# Patient Record
Sex: Male | Born: 1990 | Race: Black or African American | Hispanic: No | Marital: Single | Smoking: Never smoker
Health system: Southern US, Community
[De-identification: ages and names within clinical notes are randomized; demographics above are authoritative.]

## PROBLEM LIST (undated history)

## (undated) DIAGNOSIS — R569 Unspecified convulsions: Secondary | ICD-10-CM

## (undated) DIAGNOSIS — T148XXA Other injury of unspecified body region, initial encounter: Secondary | ICD-10-CM

## (undated) DIAGNOSIS — I10 Essential (primary) hypertension: Secondary | ICD-10-CM

## (undated) DIAGNOSIS — S72143A Displaced intertrochanteric fracture of unspecified femur, initial encounter for closed fracture: Secondary | ICD-10-CM

## (undated) DIAGNOSIS — F79 Unspecified intellectual disabilities: Secondary | ICD-10-CM

## (undated) DIAGNOSIS — Z9689 Presence of other specified functional implants: Secondary | ICD-10-CM

## (undated) DIAGNOSIS — G4731 Primary central sleep apnea: Secondary | ICD-10-CM

## (undated) DIAGNOSIS — G809 Cerebral palsy, unspecified: Secondary | ICD-10-CM

## (undated) DIAGNOSIS — R0681 Apnea, not elsewhere classified: Secondary | ICD-10-CM

## (undated) HISTORY — PX: SHOULDER SURGERY: SHX246

## (undated) HISTORY — PX: IMPLANTATION VAGAL NERVE STIMULATOR: SUR692

## (undated) HISTORY — PX: HIP SURGERY: SHX245

## (undated) HISTORY — DX: Primary central sleep apnea: G47.31

## (undated) HISTORY — DX: Apnea, not elsewhere classified: R06.81

## (undated) HISTORY — DX: Presence of other specified functional implants: Z96.89

## (undated) HISTORY — PX: FOOT SURGERY: SHX648

## (undated) HISTORY — PX: FRACTURE SURGERY: SHX138

## (undated) HISTORY — DX: Other injury of unspecified body region, initial encounter: T14.8XXA

## (undated) HISTORY — DX: Displaced intertrochanteric fracture of unspecified femur, initial encounter for closed fracture: S72.143A

---
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
## 2009-03-13 ENCOUNTER — Emergency Department (HOSPITAL_COMMUNITY): Admission: EM | Admit: 2009-03-13 | Discharge: 2009-03-13 | Payer: Self-pay | Admitting: Emergency Medicine

## 2009-05-11 IMAGING — CR DG CHEST 2V
2 series · 2 of 2 positions shown · non-contrast
Comparison: 06/13/2008

CLINICAL DATA: Seizure

CHEST - 2 VIEW

[w chest lat]
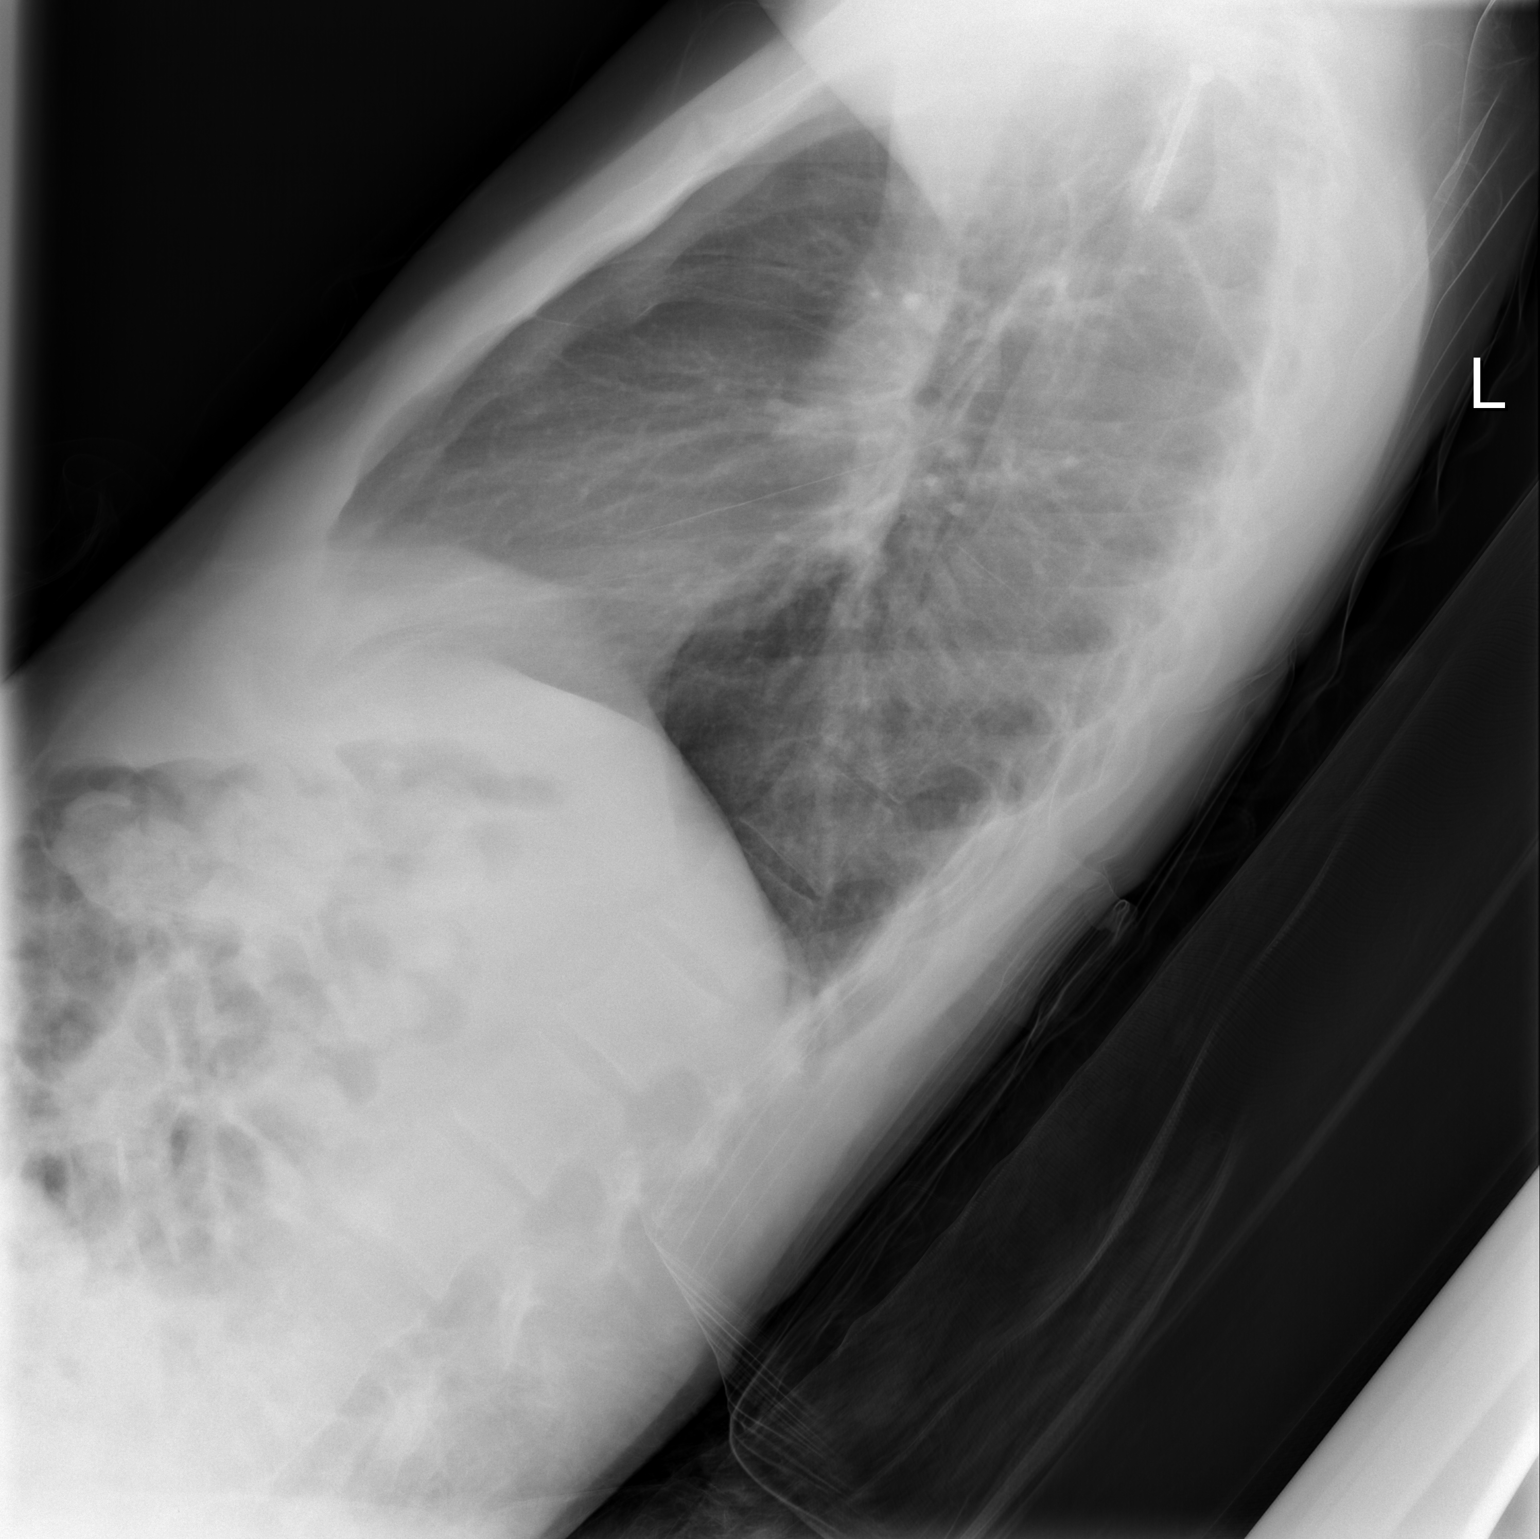

[view not recorded]
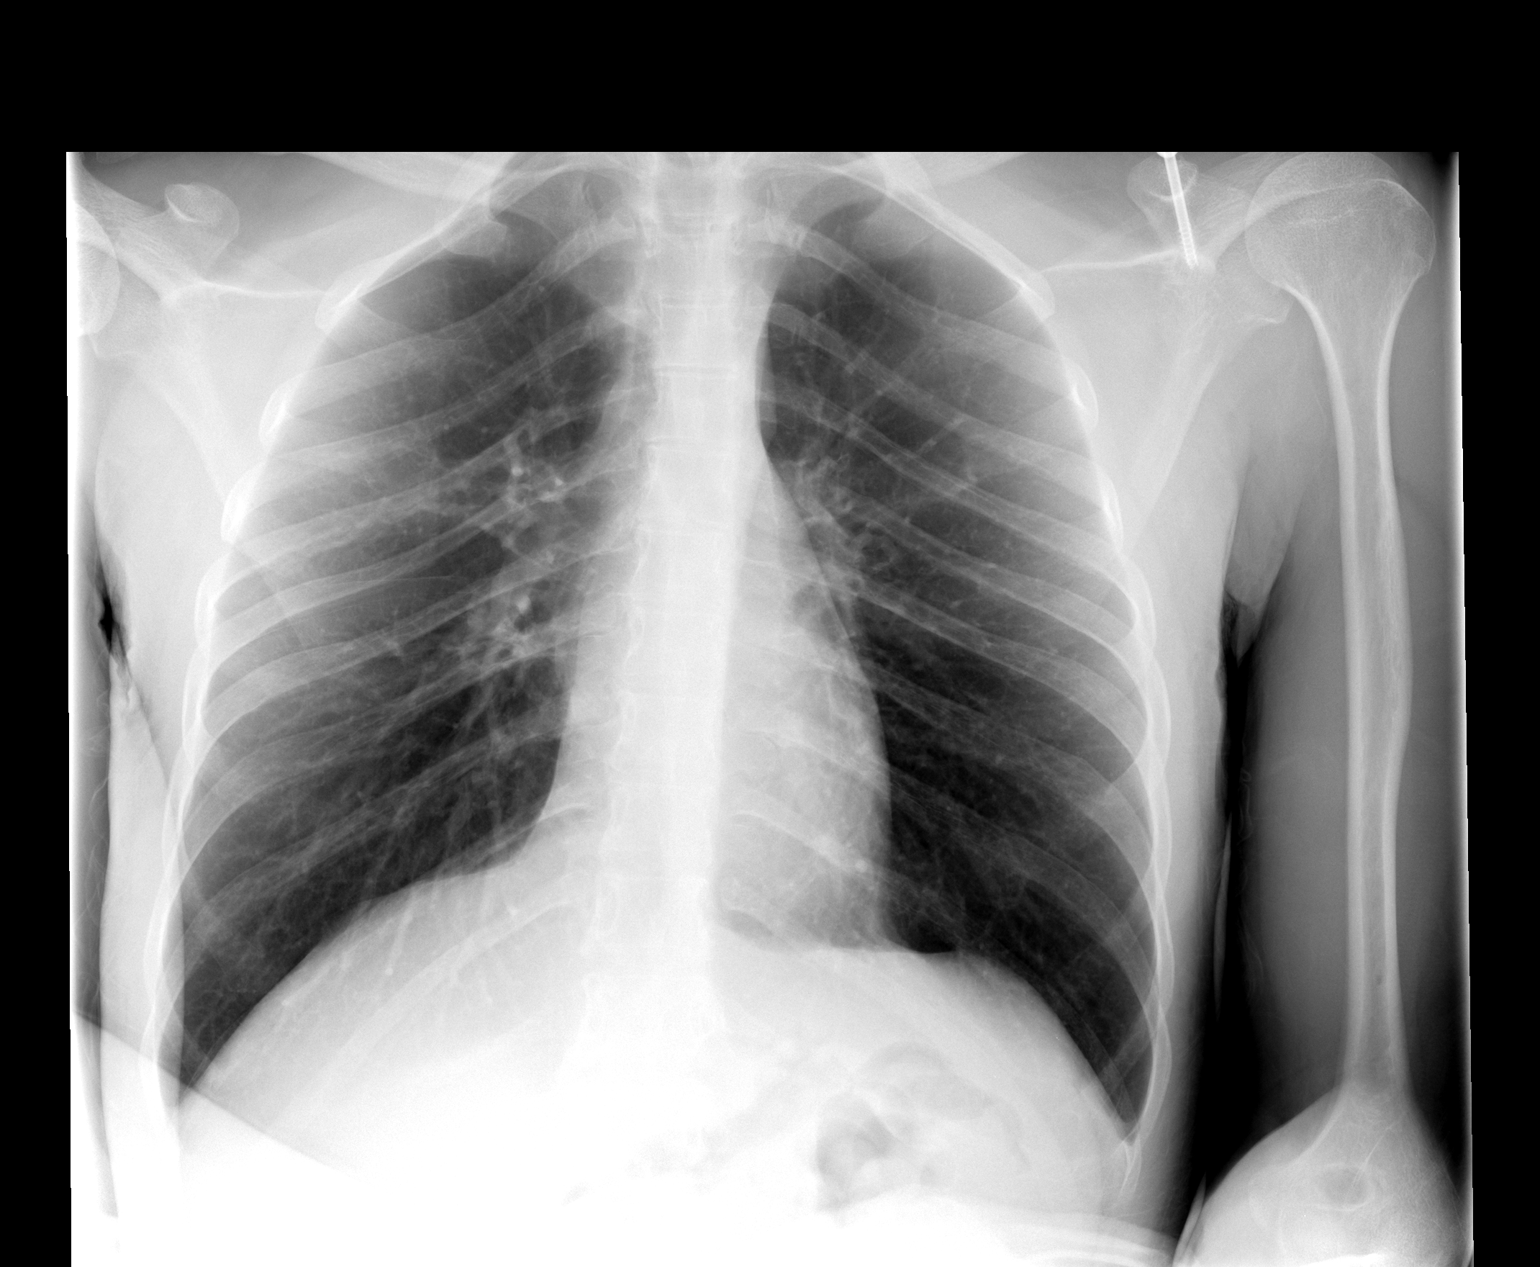

[2 of 2 positions shown; findings below may reference images not displayed]

FINDINGS: Lungs are clear.  Heart is normal in size.  No
pneumothorax.
IMPRESSION: No acute cardiopulmonary disease.

## 2009-08-28 ENCOUNTER — Emergency Department (HOSPITAL_COMMUNITY): Admission: EM | Admit: 2009-08-28 | Discharge: 2009-08-28 | Payer: Self-pay | Admitting: Emergency Medicine

## 2009-12-10 ENCOUNTER — Emergency Department (HOSPITAL_COMMUNITY): Admission: EM | Admit: 2009-12-10 | Discharge: 2009-12-10 | Payer: Self-pay | Admitting: Emergency Medicine

## 2009-12-13 ENCOUNTER — Emergency Department (HOSPITAL_COMMUNITY): Admission: EM | Admit: 2009-12-13 | Discharge: 2009-12-13 | Payer: Self-pay | Admitting: Emergency Medicine

## 2010-02-09 ENCOUNTER — Emergency Department (HOSPITAL_COMMUNITY): Admission: EM | Admit: 2010-02-09 | Discharge: 2010-02-09 | Payer: Self-pay | Admitting: Emergency Medicine

## 2010-02-28 ENCOUNTER — Emergency Department (HOSPITAL_COMMUNITY): Admission: EM | Admit: 2010-02-28 | Discharge: 2010-03-01 | Payer: Self-pay | Admitting: Emergency Medicine

## 2010-06-25 ENCOUNTER — Encounter: Payer: Self-pay | Admitting: Pediatrics

## 2010-08-02 ENCOUNTER — Emergency Department (HOSPITAL_COMMUNITY)
Admission: EM | Admit: 2010-08-02 | Discharge: 2010-08-03 | Disposition: A | Discharge status: Home / Self Care | Payer: Medicaid Other | Attending: Emergency Medicine | Admitting: Emergency Medicine

## 2010-08-02 DIAGNOSIS — G809 Cerebral palsy, unspecified: Secondary | ICD-10-CM | POA: Insufficient documentation

## 2010-08-02 DIAGNOSIS — G40909 Epilepsy, unspecified, not intractable, without status epilepticus: Secondary | ICD-10-CM | POA: Insufficient documentation

## 2010-08-02 DIAGNOSIS — Z79899 Other long term (current) drug therapy: Secondary | ICD-10-CM | POA: Insufficient documentation

## 2010-08-02 DIAGNOSIS — R4701 Aphasia: Secondary | ICD-10-CM | POA: Insufficient documentation

## 2010-08-02 DIAGNOSIS — F79 Unspecified intellectual disabilities: Secondary | ICD-10-CM | POA: Insufficient documentation

## 2010-08-03 LAB — URINALYSIS, ROUTINE W REFLEX MICROSCOPIC
Ketones, ur: NEGATIVE mg/dL
Leukocytes, UA: NEGATIVE
Protein, ur: NEGATIVE mg/dL
Urobilinogen, UA: 1 mg/dL (ref 0.0–1.0)

## 2010-08-03 LAB — URINE MICROSCOPIC-ADD ON

## 2010-08-17 LAB — DIFFERENTIAL
Basophils Absolute: 0 10*3/uL (ref 0.0–0.1)
Basophils Relative: 0 % (ref 0–1)
Eosinophils Relative: 1 % (ref 0–5)
Monocytes Absolute: 0.4 10*3/uL (ref 0.1–1.0)
Monocytes Relative: 6 % (ref 3–12)

## 2010-08-17 LAB — CBC
HCT: 41.1 % (ref 39.0–52.0)
MCH: 28.9 pg (ref 26.0–34.0)
MCHC: 33.5 g/dL (ref 30.0–36.0)
MCV: 86.2 fL (ref 78.0–100.0)
RDW: 13.2 % (ref 11.5–15.5)

## 2010-08-17 LAB — URINALYSIS, ROUTINE W REFLEX MICROSCOPIC
Bilirubin Urine: NEGATIVE
Glucose, UA: NEGATIVE mg/dL
Hgb urine dipstick: NEGATIVE
Ketones, ur: NEGATIVE mg/dL
Ketones, ur: NEGATIVE mg/dL
Nitrite: NEGATIVE
Nitrite: NEGATIVE
Protein, ur: NEGATIVE mg/dL
Urobilinogen, UA: 1 mg/dL (ref 0.0–1.0)

## 2010-08-17 LAB — POCT I-STAT, CHEM 8
BUN: 10 mg/dL (ref 6–23)
Calcium, Ion: 1.11 mmol/L — ABNORMAL LOW (ref 1.12–1.32)
Chloride: 102 mEq/L (ref 96–112)
HCT: 43 % (ref 39.0–52.0)
Sodium: 137 mEq/L (ref 135–145)
TCO2: 28 mmol/L (ref 0–100)

## 2010-08-17 LAB — URINE CULTURE
Colony Count: NO GROWTH
Culture  Setup Time: 201109280133
Culture: NO GROWTH

## 2010-08-20 LAB — POCT I-STAT, CHEM 8
BUN: 14 mg/dL (ref 6–23)
Chloride: 106 mEq/L (ref 96–112)
Creatinine, Ser: 1.1 mg/dL (ref 0.4–1.5)
Glucose, Bld: 87 mg/dL (ref 70–99)
Hemoglobin: 14.6 g/dL (ref 13.0–17.0)
Potassium: 4.1 mEq/L (ref 3.5–5.1)
Sodium: 140 mEq/L (ref 135–145)

## 2010-08-22 ENCOUNTER — Emergency Department (HOSPITAL_COMMUNITY)
Admission: EM | Admit: 2010-08-22 | Discharge: 2010-08-22 | Disposition: A | Discharge status: Home / Self Care | Payer: Medicaid Other | Attending: Emergency Medicine | Admitting: Emergency Medicine

## 2010-08-22 DIAGNOSIS — G809 Cerebral palsy, unspecified: Secondary | ICD-10-CM | POA: Insufficient documentation

## 2010-08-22 DIAGNOSIS — Z79899 Other long term (current) drug therapy: Secondary | ICD-10-CM | POA: Insufficient documentation

## 2010-08-22 DIAGNOSIS — F79 Unspecified intellectual disabilities: Secondary | ICD-10-CM | POA: Insufficient documentation

## 2010-08-22 DIAGNOSIS — G40909 Epilepsy, unspecified, not intractable, without status epilepticus: Secondary | ICD-10-CM | POA: Insufficient documentation

## 2010-08-22 LAB — URINALYSIS, ROUTINE W REFLEX MICROSCOPIC
Bilirubin Urine: NEGATIVE
Glucose, UA: NEGATIVE mg/dL
Ketones, ur: NEGATIVE mg/dL
Nitrite: NEGATIVE
Specific Gravity, Urine: 1.024 (ref 1.005–1.030)
pH: 7 (ref 5.0–8.0)

## 2010-08-22 LAB — BASIC METABOLIC PANEL
BUN: 12 mg/dL (ref 6–23)
CO2: 27 mEq/L (ref 19–32)
Chloride: 107 mEq/L (ref 96–112)
GFR calc non Af Amer: >60 mL/min (ref >60)
Glucose, Bld: 100 mg/dL — ABNORMAL HIGH (ref 70–99)
Potassium: 3.9 mEq/L (ref 3.5–5.1)
Sodium: 139 mEq/L (ref 135–145)

## 2010-09-10 LAB — URINE MICROSCOPIC-ADD ON

## 2010-09-10 LAB — URINE CULTURE

## 2010-09-10 LAB — COMPREHENSIVE METABOLIC PANEL
ALT: 19 U/L (ref 0–53)
Alkaline Phosphatase: 66 U/L (ref 39–117)
BUN: 8 mg/dL (ref 6–23)
CO2: 29 mEq/L (ref 19–32)
Calcium: 8.9 mg/dL (ref 8.4–10.5)
GFR calc non Af Amer: >60 mL/min (ref >60)
Glucose, Bld: 106 mg/dL — ABNORMAL HIGH (ref 70–99)
Potassium: 4.4 mEq/L (ref 3.5–5.1)
Sodium: 140 mEq/L (ref 135–145)

## 2010-09-10 LAB — DIFFERENTIAL
Eosinophils Absolute: 0 10*3/uL (ref 0.0–0.7)
Lymphocytes Relative: 55 % — ABNORMAL HIGH (ref 12–46)
Monocytes Absolute: 0.2 10*3/uL (ref 0.1–1.0)
Neutrophils Relative %: 38 % — ABNORMAL LOW (ref 43–77)

## 2010-09-10 LAB — CBC
HCT: 37.6 % — ABNORMAL LOW (ref 39.0–52.0)
Hemoglobin: 12.8 g/dL — ABNORMAL LOW (ref 13.0–17.0)
MCHC: 34.1 g/dL (ref 30.0–36.0)
RBC: 4.42 MIL/uL (ref 4.22–5.81)
RDW: 13.4 % (ref 11.5–15.5)

## 2010-09-10 LAB — URINALYSIS, ROUTINE W REFLEX MICROSCOPIC
Bilirubin Urine: NEGATIVE
Glucose, UA: NEGATIVE mg/dL
Hgb urine dipstick: NEGATIVE
Specific Gravity, Urine: 1.027 (ref 1.005–1.030)
pH: 7 (ref 5.0–8.0)

## 2010-09-10 LAB — D-DIMER, QUANTITATIVE: D-Dimer, Quant: 0.26 ug/mL-FEU (ref 0.00–0.48)

## 2010-09-10 LAB — CULTURE, BLOOD (ROUTINE X 2)

## 2010-09-11 LAB — POCT I-STAT EG7
Acid-base deficit: 18 mmol/L — ABNORMAL HIGH (ref 0.0–2.0)
Bicarbonate: 14.2 mEq/L — ABNORMAL LOW (ref 20.0–24.0)
HCT: 45 % (ref 39.0–52.0)
O2 Saturation: 99 %
TCO2: 16 mmol/L (ref 0–100)
pO2, Ven: 244 mmHg — ABNORMAL HIGH (ref 30.0–45.0)

## 2010-09-11 LAB — URINALYSIS, ROUTINE W REFLEX MICROSCOPIC
Bilirubin Urine: NEGATIVE
Glucose, UA: NEGATIVE mg/dL
Hgb urine dipstick: NEGATIVE
Nitrite: NEGATIVE
Specific Gravity, Urine: 1.031 — ABNORMAL HIGH (ref 1.005–1.030)
pH: 8.5 — ABNORMAL HIGH (ref 5.0–8.0)

## 2010-09-11 LAB — POCT I-STAT, CHEM 8
BUN: 12 mg/dL (ref 6–23)
Calcium, Ion: 1.09 mmol/L — ABNORMAL LOW (ref 1.12–1.32)
Chloride: 106 mEq/L (ref 96–112)
Sodium: 140 mEq/L (ref 135–145)

## 2010-09-11 LAB — DIFFERENTIAL
Basophils Relative: 0 % (ref 0–1)
Eosinophils Absolute: 0 10*3/uL (ref 0.0–0.7)
Monocytes Absolute: 0.4 10*3/uL (ref 0.1–1.0)
Monocytes Relative: 3 % (ref 3–12)

## 2010-09-11 LAB — COMPREHENSIVE METABOLIC PANEL
BUN: 11 mg/dL (ref 6–23)
CO2: 23 mEq/L (ref 19–32)
Chloride: 103 mEq/L (ref 96–112)
Creatinine, Ser: 0.67 mg/dL (ref 0.4–1.5)
GFR calc non Af Amer: >60 mL/min (ref >60)
Glucose, Bld: 131 mg/dL — ABNORMAL HIGH (ref 70–99)
Total Bilirubin: 0.7 mg/dL (ref 0.3–1.2)

## 2010-09-11 LAB — URINE CULTURE

## 2010-09-11 LAB — CBC
HCT: 45.1 % (ref 39.0–52.0)
MCV: 85.2 fL (ref 78.0–100.0)
RBC: 5.29 MIL/uL (ref 4.22–5.81)
WBC: 11.3 10*3/uL — ABNORMAL HIGH (ref 4.0–10.5)

## 2010-09-11 LAB — PHENYTOIN LEVEL, TOTAL: Phenytoin Lvl: 19.7 ug/mL (ref 10.0–20.0)

## 2010-09-11 LAB — URINE MICROSCOPIC-ADD ON

## 2010-09-11 LAB — CULTURE, BLOOD (SINGLE)

## 2010-09-18 LAB — URINE MICROSCOPIC-ADD ON

## 2010-09-18 LAB — URINE CULTURE
Colony Count: >=100000
Colony Count: >=100000

## 2010-09-18 LAB — BASIC METABOLIC PANEL
CO2: 24 mEq/L (ref 19–32)
CO2: 28 mEq/L (ref 19–32)
Calcium: 8.1 mg/dL — ABNORMAL LOW (ref 8.4–10.5)
Calcium: 8.6 mg/dL (ref 8.4–10.5)
Creatinine, Ser: 0.65 mg/dL (ref 0.4–1.5)
Creatinine, Ser: 0.75 mg/dL (ref 0.4–1.5)
GFR calc Af Amer: >60 mL/min (ref >60)
GFR calc Af Amer: >60 mL/min (ref >60)
GFR calc Af Amer: >60 mL/min (ref >60)
GFR calc non Af Amer: >60 mL/min (ref >60)
GFR calc non Af Amer: >60 mL/min (ref >60)
GFR calc non Af Amer: >60 mL/min (ref >60)
GFR calc non Af Amer: >60 mL/min (ref >60)
Glucose, Bld: 158 mg/dL — ABNORMAL HIGH (ref 70–99)
Glucose, Bld: 68 mg/dL — ABNORMAL LOW (ref 70–99)
Potassium: 4 mEq/L (ref 3.5–5.1)
Potassium: 4.1 mEq/L (ref 3.5–5.1)
Sodium: 135 mEq/L (ref 135–145)
Sodium: 138 mEq/L (ref 135–145)
Sodium: 140 mEq/L (ref 135–145)
Sodium: 140 mEq/L (ref 135–145)

## 2010-09-18 LAB — URINALYSIS, ROUTINE W REFLEX MICROSCOPIC
Bilirubin Urine: NEGATIVE
Glucose, UA: NEGATIVE mg/dL
Hgb urine dipstick: NEGATIVE
Hgb urine dipstick: NEGATIVE
Ketones, ur: NEGATIVE mg/dL
Nitrite: NEGATIVE
Protein, ur: NEGATIVE mg/dL
Specific Gravity, Urine: 1.037 — ABNORMAL HIGH (ref 1.005–1.030)
Urobilinogen, UA: 1 mg/dL (ref 0.0–1.0)
pH: 6 (ref 5.0–8.0)

## 2010-09-18 LAB — CBC
HCT: 37.3 % — ABNORMAL LOW (ref 39.0–52.0)
HCT: 45.1 % (ref 39.0–52.0)
Hemoglobin: 11.8 g/dL — ABNORMAL LOW (ref 13.0–17.0)
Hemoglobin: 12.3 g/dL — ABNORMAL LOW (ref 13.0–17.0)
Hemoglobin: 12.7 g/dL — ABNORMAL LOW (ref 13.0–17.0)
Hemoglobin: 14.7 g/dL (ref 13.0–17.0)
MCHC: 33.2 g/dL (ref 30.0–36.0)
RBC: 4.1 MIL/uL — ABNORMAL LOW (ref 4.22–5.81)
RBC: 4.32 MIL/uL (ref 4.22–5.81)
RBC: 4.48 MIL/uL (ref 4.22–5.81)
RDW: 13 % (ref 11.5–15.5)
RDW: 13.5 % (ref 11.5–15.5)
WBC: 5 10*3/uL (ref 4.0–10.5)

## 2010-09-18 LAB — DIFFERENTIAL
Basophils Absolute: 0 10*3/uL (ref 0.0–0.1)
Eosinophils Relative: 0 % (ref 0–5)
Lymphocytes Relative: 11 % — ABNORMAL LOW (ref 12–46)
Lymphs Abs: 1.1 10*3/uL (ref 0.7–4.0)
Monocytes Absolute: 0.7 10*3/uL (ref 0.1–1.0)
Monocytes Relative: 7 % (ref 3–12)

## 2010-09-23 ENCOUNTER — Ambulatory Visit (INDEPENDENT_AMBULATORY_CARE_PROVIDER_SITE_OTHER): Payer: Medicaid Other

## 2010-09-23 DIAGNOSIS — R569 Unspecified convulsions: Secondary | ICD-10-CM

## 2010-09-23 DIAGNOSIS — R030 Elevated blood-pressure reading, without diagnosis of hypertension: Secondary | ICD-10-CM

## 2010-09-30 ENCOUNTER — Emergency Department (HOSPITAL_COMMUNITY): Payer: Medicaid Other

## 2010-09-30 ENCOUNTER — Emergency Department (HOSPITAL_COMMUNITY)
Admission: EM | Admit: 2010-09-30 | Discharge: 2010-09-30 | Disposition: A | Discharge status: Home / Self Care | Payer: Medicaid Other | Attending: Emergency Medicine | Admitting: Emergency Medicine

## 2010-09-30 DIAGNOSIS — Z9889 Other specified postprocedural states: Secondary | ICD-10-CM | POA: Insufficient documentation

## 2010-09-30 DIAGNOSIS — G809 Cerebral palsy, unspecified: Secondary | ICD-10-CM | POA: Insufficient documentation

## 2010-09-30 DIAGNOSIS — IMO0002 Reserved for concepts with insufficient information to code with codable children: Secondary | ICD-10-CM | POA: Insufficient documentation

## 2010-09-30 DIAGNOSIS — H60399 Other infective otitis externa, unspecified ear: Secondary | ICD-10-CM | POA: Insufficient documentation

## 2010-09-30 DIAGNOSIS — I10 Essential (primary) hypertension: Secondary | ICD-10-CM | POA: Insufficient documentation

## 2010-09-30 DIAGNOSIS — R296 Repeated falls: Secondary | ICD-10-CM | POA: Insufficient documentation

## 2010-09-30 DIAGNOSIS — F79 Unspecified intellectual disabilities: Secondary | ICD-10-CM | POA: Insufficient documentation

## 2010-09-30 DIAGNOSIS — G40909 Epilepsy, unspecified, not intractable, without status epilepticus: Secondary | ICD-10-CM | POA: Insufficient documentation

## 2010-09-30 DIAGNOSIS — R4182 Altered mental status, unspecified: Secondary | ICD-10-CM | POA: Insufficient documentation

## 2010-09-30 LAB — CBC
MCV: 81.8 fL (ref 78.0–100.0)
Platelets: 167 10*3/uL (ref 150–400)
RBC: 5.17 MIL/uL (ref 4.22–5.81)
RDW: 12.3 % (ref 11.5–15.5)
WBC: 4.7 10*3/uL (ref 4.0–10.5)

## 2010-09-30 LAB — BASIC METABOLIC PANEL
Chloride: 101 mEq/L (ref 96–112)
GFR calc Af Amer: >60 mL/min (ref >60)
GFR calc non Af Amer: >60 mL/min (ref >60)
Potassium: 4.2 mEq/L (ref 3.5–5.1)
Sodium: 136 mEq/L (ref 135–145)

## 2010-09-30 LAB — URINALYSIS, ROUTINE W REFLEX MICROSCOPIC
Glucose, UA: NEGATIVE mg/dL
Ketones, ur: NEGATIVE mg/dL
Nitrite: NEGATIVE
Specific Gravity, Urine: 1.011 (ref 1.005–1.030)
pH: 7 (ref 5.0–8.0)

## 2010-09-30 LAB — DIFFERENTIAL
Basophils Absolute: 0 10*3/uL (ref 0.0–0.1)
Basophils Relative: 0 % (ref 0–1)
Eosinophils Relative: 2 % (ref 0–5)
Monocytes Absolute: 0.5 10*3/uL (ref 0.1–1.0)
Monocytes Relative: 11 % (ref 3–12)
Neutro Abs: 2.7 10*3/uL (ref 1.7–7.7)

## 2010-10-02 LAB — URINE CULTURE
Colony Count: NO GROWTH
Culture  Setup Time: 201204290259

## 2010-10-17 NOTE — Discharge Summary (Signed)
NAME:  [name], [name] NO.:  [id number]   MEDICAL RECORD NO.:  [id number]          PATIENT TYPE:  INP   LOCATION:  6120                         FACILITY:  [facility]   PHYSICIAN:  Rondall A. Maple Hudson, M.D. DATE OF BIRTH:  1991-05-24   DATE OF ADMISSION:  11/04/2007  DATE OF DISCHARGE:  11/06/2007                               DISCHARGE SUMMARY   REASON FOR HOSPITALIZATION:  Rectal tear and perirectal abscess.   SIGNIFICANT FINDINGS:  Zachary [name] is an 20 year old boy with MRCP and autism,  also with a history of constipation and a seizure disorder, who had a  recent rectal tear due to hard stool and subsequently developed an  abscess in the perirectal region.  It was already spontaneously draining  at the time of admission.  He was initially febrile but it resolved with  treatment with clindamycin.  His white count on admission was 9.2,  hemoglobin 13.3, and platelets 144 with 83% neutrophils.  Under  clindamycin, his pain and fever improved.  He did not have a bowel  movement on the first day of admission, so he was treated with an  increased dose of MiraLax as well as Fleet enema and magnesium citrate  which resulted in liquid stool.  No hard stool was still passed, so  there is still some question of hard stool remaining in his bowel. Wound  culture was performed at the time of discharge and it was growing only  Pseudomonas with sensitivities pending.  Also, strongly suspicious for  staphylococcal infection in his abscess.  The wound was improving under  clindamycin.  Decision made to change treatment to levofloxacin as this  will treat both Pseudomonas and the staphylococcus that has been  previously been isolated in this patient.   OPERATIONS AND PROCEDURES:  None.   FINAL DIAGNOSIS:  Rectal tear, perirectal abscess.   DISCHARGE MEDICATIONS:  1. Levaquin 500 mg p.o. daily x7 days.  2. Trileptal 900 mg p.o. twice daily.  3. Colace 100 mg p.o. twice daily.  4. Keppra  1500 mg p.o. twice daily.  5. MiraLax 1-2 caps p.o. twice daily.   Wound and blood cultures still pending at the time of discharge.   Follow up will be with Dr. Maple Hudson.  Please call on Monday to make an  appointment.  Telephone number is [phone].   CONDITION ON DISCHARGE:  Improved.   Discharge weight is the same as admission weight which is 47 kilos.      Pediatrics Resident    ______________________________  Madaline Brilliant A. Maple Hudson, M.D.    PR/MEDQ  D:  11/06/2007  T:  11/07/2007  Job:  [id number]

## 2010-10-17 NOTE — Discharge Summary (Signed)
NAME:  Zachary [name], Zachary [name] NO.:  [id number]   MEDICAL RECORD NO.:  [id number]          PATIENT TYPE:  INP   LOCATION:  6150                         FACILITY:  [facility]   PHYSICIAN:  Kandace Blitz, MD    DATE OF BIRTH:  [birth year]   DATE OF ADMISSION:  01/19/2007  DATE OF DISCHARGE:  01/21/2007                               DISCHARGE SUMMARY   REASON FOR HOSPITALIZATION:  Seizures and vomiting.   SIGNIFICANT FINDINGS:  BMP within normal limits.  Rapid strep negative.   TREATMENT:  1. Ativan.  2. Keppra.  3. Trileptal.  4. Zofran.   OPERATIONS AND PROCEDURES:  None.   FINAL DIAGNOSES:  1. Seizure after missed medication.  2. Mental retardation.  3. Viral gastritis.   DISCHARGE MEDICATIONS AND INSTRUCTIONS:  1. Keppra 1000 mg p.o. b.i.d.  2. Trileptal 600 mg p.o. b.i.d.  3. Zofran 4 mg p.o. every 6 hours p.r.n. nausea as before.  4. Claritin per home regimen.   PENDING RESULTS:  None.   FOLLOWUP:  The patient to follow up with Dr. Maple Hudson and call for an  appointment in 2 days after discharge.   DISCHARGE WEIGHT:  47.2 kg.   DISCHARGE CONDITION:  Good.           ______________________________  Kandace Blitz, MD     HG/MEDQ  D:  01/21/2007  T:  01/21/2007  Job:  [id number]

## 2010-10-17 NOTE — H&P (Signed)
NAME:  Zachary [name], Zachary [name] NO.:  [id number]   MEDICAL RECORD NO.:  [id number]          PATIENT TYPE:  INP   LOCATION:  5022                         FACILITY:  [facility]   PHYSICIAN:  Loreta Ave, M.D. DATE OF BIRTH:  1990-09-10   DATE OF ADMISSION:  06/13/2008  DATE OF DISCHARGE:                              HISTORY & PHYSICAL   CHIEF COMPLAINT:  Left hip pain.   HISTORY OF PRESENT ILLNESS:  An 20 year old black male comes in to the  office today with his mother after falling down 12 steps at his home.  Felt a pop in his hip and had extreme pain and was unable to weightbear.  He has a history of cerebral palsy, seizure disorder, and mental  retardation and all of history obtained from the patient's mother who  was present.  To her knowledge, he is not complaining of any layers of  discomfort, though x-rays were taken of his left shoulder, thoracic,  lumbar spine, and C-spine.  These all negative for any acute injuries.  He is status post ORIF, left shoulder and hardware is intact.   CURRENT MEDICATIONS:  Keppra and Trileptal.   ALLERGIES:  PENICILLIN.   PAST MEDICAL/SURGICAL HISTORY:  1. Cerebral palsy.  2. Seizures.  3. Mental retardation.  4. ORIF, left shoulder.  5. Abdominal skin grafts.   FAMILY HISTORY:  Noncontributory.   SOCIAL HISTORY:  Does not smoke, drink alcohol, or use illicit drugs.   REVIEW OF SYSTEMS:  Per his mother, does not have any cardiac,  pulmonary, GI, or GU issues.  See HPI.   PHYSICAL EXAMINATION:  VITAL SIGNS:  Temp 98, pulse 102, respirations  20, and blood pressure 116/77.  GENERAL:  The patient is lying comfortably on stretcher.  HEENT:  Head is normocephalic and atraumatic.  PERRLA and EOMI.  Oral  mucosa pink and moist.  NECK:  Good range of motion.  Supple, nontender.  LUNGS:  CTA bilaterally.  No wheezes, rales, on rhonchi.  HEART:  RR, tachycardic.  S1 and S2.  No murmurs noted.  ABDOMEN:  Round, nondistended.  Bowel  sounds x4.  Soft, nontender.  EXTREMITIES:  Positive log roll of the left hip.  NEUROLOGIC:  Neurovascularly intact.  SKIN:  Warm and dry.   X-ray show a displaced left femoral neck fracture.   LABORATORY DATA:  WBC 9.9, hematocrit 45.1, hemoglobin 14.7, and  platelets 178.  Sodium 140, potassium 4.0, chloride 105, CO2 26, BUN 15,  creatinine 0.92, and glucose 158.   ASSESSMENT:  1. Left femoral neck fracture.  2. Cerebral palsy.  3. Seizure disorder.  4. Mental retardation.   PLAN:  We will take the patient to the OR this evening for open  reduction and internal fixation procedure.  Surgery, procedure along  with potential risk, complications discussed with the patient and his  mother, who was present.  Also, I briefly discussed left hip  hemiarthroplasty procedure and advised the potential risk involved with  this as well.  At this time, I do not recommend hemiarthroplasty  procedure due to his age.  All questions answered.     ______________________________  Shary Key, PA-C      Loreta Ave, M.D.  Electronically Signed    JJ/MEDQ  D:  06/13/2008  T:  06/14/2008  Job:  [id number]

## 2010-10-17 NOTE — Op Note (Signed)
NAME:  Zachary [name], Zachary [name] NO.:  [id number]   MEDICAL RECORD NO.:  [id number]          PATIENT TYPE:  AMB   LOCATION:  NESC                         FACILITY:  [facility]   PHYSICIAN:  Jamison Neighbor, M.D.  DATE OF BIRTH:  1991-02-24   DATE OF PROCEDURE:  02/03/2008  DATE OF DISCHARGE:                               OPERATIVE REPORT   SERVICE:  Urology.   PREOPERATIVE DIAGNOSIS:  Meatal stenosis.   POSTOPERATIVE DIAGNOSIS:  Meatal stenosis, urethral stricture.   PROCEDURE:  Cystoscopy, meatotomy, urethral dilation, placement of a  Foley catheter.   SURGEON:  Jamison Neighbor, M.D.   ANESTHESIA:  General.   COMPLICATIONS:  None.   DRAINS:  An 18-French catheter.   HISTORY:  This 20 year old male has had intermittent episodes of urinary  retention.  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  The  patient is now to undergo cystoscopy, meatotomy, and possible dilation  if additional stricturing is noted.  The patient understands the risks  and benefits of the procedure and gave full informed consent.   PROCEDURE:  After successful induction of general anesthesia, the  patient was placed in the dorsal lithotomy position, prepped with  Betadine and draped in the usual sterile fashion.  The web that was  noted at the meatus was split in the midline and the mucosa was then  reapproximated with a pair of 4-0 Vicryl sutures.  The urethra was then  dilated and it is clear there was a little bit of a stricture  downstream.  This area was successfully dilated up to 24-French.  Cystoscopic examination showed the stricture had been opened.  The  remainder the urethra was unremarkable.  The bladder neck was normal.  The bladder itself was carefully inspected.  No tumors or stones could  be seen.  The ureteral orifices were normal.  A guide wire was left in  place.  The cystoscope was removed.  An  18-French catheter was passed  over the guide wire.  The catheter was placed to straight drainage.  The  patient tolerated procedure and was taken to the recovery room in good  condition.  He was sent home with the catheter to a leg bag.  He will be  given Pyridium and Ditropan for symptom management, Lortab in case there  is any pain, and he will be given antibiotic coverage until the catheter  is removed.     Jamison Neighbor, M.D.  Electronically Signed    RJE/MEDQ  D:  02/03/2008  T:  02/03/2008  Job:  [id number]

## 2010-10-17 NOTE — Op Note (Signed)
NAME:  Zachary [name], PRESSNELL NO.:  [id number]   MEDICAL RECORD NO.:  [id number]          PATIENT TYPE:  INP   LOCATION:  5022                         FACILITY:  [facility]   PHYSICIAN:  Loreta Ave, M.D. DATE OF BIRTH:  [birth year]   DATE OF PROCEDURE:  06/13/2008  DATE OF DISCHARGE:                               OPERATIVE REPORT   PREOPERATIVE DIAGNOSIS:  Displaced vertical shear femoral neck fracture,  left hip.   POSTOPERATIVE DIAGNOSIS:  Displaced vertical shear femoral neck  fracture, left hip.   PROCEDURE:  Closed reduction with traction and fracture table.  Fixation  with cannulated 7.3-mm Synthes screws x3.   SURGEON:  Loreta Ave, MD   ASSISTANT:  Genene Churn. Barry Dienes, [place], present throughout the entire case and  necessary for timely completion of the procedure.   ANESTHESIA:  General.   ESTIMATED BLOOD LOSS:  Minimal.   SPECIMENS:  None.   CULTURES:  None.   COMPLICATIONS:  None.   DRESSINGS:  Sterile compressive.   PROCEDURE:  The patient brought to the operating room and after adequate  anesthesia had been obtained placed on the fracture table.  Appropriate  padding support.  Longitudinal traction in internal rotation to reduce  its fracture.  Fluoroscopic guidance throughout.  Vertical shear  fracture through the femoral neck relatively high.  Able to reduce this  to a reasonable position fluoroscopically.  I then fixed it with 3  cannulated 7.3 mm screws, placing guidewires parallel, pre-drilling,  measuring and placing lag screws up into the head.  All threads were on  the femoral head side of the fracture.  Good alignment, fixation,  compression, and stability.  Concerned about healing because of the  direction of the fracture being vertical, but I felt that I had gotten  good fixation.  The fracture was too high to allow for a compression hip  system and the cannulated screws were the best method of fixation.  Once  I confirmed good  fixation alignment and stability, fluoroscopic views  through full motion to be sure all the screws were in good  position.  Wound irrigated.  Closed with Vicryl.  Margins were injected  with Marcaine.  Sterile compressive dressing applied.  Taken off of the  fracture table.  Anesthesia reversed.  Brought to the recovery room.  Tolerated the surgery well.  No complications.      Loreta Ave, M.D.  Electronically Signed     DFM/MEDQ  D:  06/14/2008  T:  06/14/2008  Job:  [id number]

## 2010-10-17 NOTE — Discharge Summary (Signed)
NAME:  Zachary [name], Zachary [name] NO.:  [id number]   MEDICAL RECORD NO.:  [id number]          PATIENT TYPE:  INP   LOCATION:  6148                         FACILITY:  [facility]   PHYSICIAN:  Rondall A. Maple Hudson, M.D. DATE OF BIRTH:  [birth year]   DATE OF ADMISSION:  11/05/2008  DATE OF DISCHARGE:  11/07/2008                               DISCHARGE SUMMARY   REASON FOR HOSPITALIZATION:  Emesis, dehydration, inability to take p.o.  seizure medications, and seizure.   FINAL DIAGNOSES:  1. Pneumonia.  2. Urinary tract infection.  3. Seizure disorder with seizure.   BRIEF HOSPITAL COURSE:  This is a 20 year old male with history of  mental retardation, cerebral palsy, and seizure disorder who presented  with approximately 6 hours of acute onset of emesis which was nonbloody  and nonbilious.  The patient had had positive sick contacts with similar  symptoms.  CBC was notable for white blood cell count of 11.3, otherwise  within normal limits.  Chem-10 was within normal limits.  UA with 3-6  white blood cells, small LE, and few bacteria.  Urine culture with  50,000 Gram-negative rods.  Blood culture negative x1 day.  Chest x-ray  with right upper lobe/right lower lobe pneumonia concern for aspiration.  Afebrile, vital signs stable on admission, given IV fluids and Zofran.  The patient had seizure x1 while in the ED, started on Dilantin,  continued on Keppra and Trileptal.  Trileptal level less than 2.0.  Dilantin trough 16.2 on day of discharge.  The patient's medications  were adjusted prior to discharge as below.  The patient was started on  IV and transitioned to p.o. clindamycin for aspiration pneumonia.  The  patient did not have any further seizure events during the course of his  hospitalization.  The patient also displayed decreased range of motion  in left upper extremity, however, had negative films except for soft  tissue swelling in the left first.  The patient was tolerating  p.o.  medications and p.o. diet prior to discharge.  The patient was afebrile  and vital signs were stable prior to discharge.   DISCHARGE WEIGHT:  50 kg.   DISCHARGE CONDITION:  Improved.   DISCHARGE DIET:  Resume diet.   DISCHARGE ACTIVITY:  As before.   HOME MEDICATIONS TO BE CONTINUED:  1. Keppra 1500 mg p.o. b.i.d.  2. MiraLax 17 grams p.o. daily.   NEW MEDICATIONS:  1. Dilantin 100 mg p.o. b.i.d.  2. Trileptal at new dose 600 mg p.o. b.i.d.  3. Levaquin 250 mg p.o. daily x7 days.  4. Clindamycin 300 mg p.o. q.6 h. x10 days.   DISCONTINUED MEDICATIONS:  None.   IMMUNIZATIONS:  None.   PENDING RESULTS:  1. Urine culture  2. Blood culture.   FOLLOWUP ISSUES:  1. Follow UTI, follow pneumonia, follow seizure disorder, and follow      up primary MD, Dr. Maple Hudson, [facility].  We will call to set      up appointment for the patient.  2. The patient will follow up with Dr. Sharene Skeans.  We will call  to set      up appointment for the patient.  This discharge summary will be      faxed to Dr. Sharene Skeans and to Dr. Maple Hudson.      Bobby Rumpf, MD  Electronically Signed     ______________________________  Sharmaine Base. Maple Hudson, M.D.    KC/MEDQ  D:  11/07/2008  T:  11/08/2008  Job:  [id number]   cc:   Deanna Artis. Sharene Skeans, M.D.  Rondall A. Maple Hudson, M.D.

## 2010-10-20 NOTE — Discharge Summary (Signed)
NAME:  Zachary [name], Zachary [name] NO.:  [id number]   MEDICAL RECORD NO.:  [id number]          PATIENT TYPE:  OIB   LOCATION:  6116                         FACILITY:  [facility]   PHYSICIAN:  Sylvan Cheese, M.D.       DATE OF BIRTH:  [birth year]   DATE OF ADMISSION:  07/10/2006  DATE OF DISCHARGE:  07/11/2006                               DISCHARGE SUMMARY   REASON FOR HOSPITALIZATION:  This is a 20 year old African-American male  with a history of autism and a seizure disorder.  He had extensive  dental work under general anesthesia the morning of admission and was  admitted for postoperative pain control and monitoring to ensure that he  could tolerate oral intake.  Upon admission, his exam was within normal  limits.  He was started on Tylenol 650 mg p.o. every four hours,  scheduled for pain.  He was tolerating a full breakfast prior to the  time of discharge and was at his baseline state at the time of  discharge.   PROCEDURES:  None.   DIAGNOSES:  1. Autism.  2. Seizure disorder.  3. Status post dental restoration under general anesthesia.   DISCHARGE MEDICATIONS:  1. Trileptal 600 mg p.o. b.i.d.  2. Keppra 1000 mg p.o. b.i.d.  3. MiraLax 17 gm daily.  4. Tylenol 325 mg p.o. every 4 hours as needed for pain.   FOLLOWUP INSTRUCTIONS:  The patient was instructed to follow up with Dr.  Maple Hudson, his primary physician, as needed.   DISCHARGE WEIGHT:  46.7 kg.           ______________________________  Sylvan Cheese, M.D.     MJ/MEDQ  D:  07/11/2006  T:  07/12/2006  Job:  [id number]

## 2010-10-20 NOTE — Op Note (Signed)
NAME:  Zachary [name], STAVOLA                     ACCOUNT NO.:  [id number]   MEDICAL RECORD NO.:  [id number]          PATIENT TYPE:  OIB   LOCATION:  6116                         FACILITY:  [facility]   PHYSICIAN:  Paulette Blanch, DDS    DATE OF BIRTH:  25-Mar-1991   DATE OF PROCEDURE:  07/10/2006  DATE OF DISCHARGE:  07/11/2006                               OPERATIVE REPORT   Patient is a 20 year old, African-American male for comprehensive dental  treatment under general anesthesia.   PREOPERATIVE DIAGNOSES:  Dental caries.   POSTOPERATIVE DIAGNOSES:  1. Dental caries.  2. Mild periodontitis.   X-rays taken were 4 bite wings and 3 periapicals.  The patient had a  gross debridement of all teeth to remove calculus deposits.  Some  bleeding occurred.  Patient's following restorations completed:  1. Tooth 4 was an occlusal composite.  2. Tooth 5 was an occlusal composite.  3. Tooth 12 was an occlusal composite.  4. Tooth 13 was an occlusal composite.  5. Tooth 15 was an occlusal composite.  6. Tooth 20 was an occlusal composite.  7. Tooth 21 was an occlusal composite.  8. Tooth 28 was an occlusal composite.  9. Tooth 29 was an occlusal composite.  10.Tooth 2 was an occlusal facial and lingual composite.  11.Tooth 6 was a lingual composite.  12.Tooth 7 was a lingual composite.  13.Tooth 9 was a lingual composite.  14.Tooth 10 was a lingual composite.  15.Tooth 11 was a lingual composite.  16.Tooth number 23 was a mesiofacial and lingual composite.  17.Tooth number 25 was a distal facial and lingual (dictation cuts      out) composite.   The patient was transported to PACU in stable condition.  The patient  will stay overnight for observation.  The patient is autistic,  nonverbal, has a history of MRSA and a history of seizures.  Patient was  admitted to the pediatric floor via his pediatrician, Dr. Roni Bread.           ______________________________  Paulette Blanch, DDS     TRR/MEDQ  D:   07/11/2006  T:  07/12/2006  Job:  [id number]

## 2010-10-20 NOTE — Consult Note (Signed)
NAME:  VERTIS, SCHEIB NO.:  [id number]   MEDICAL RECORD NO.:  [id number]          PATIENT TYPE:  INP   LOCATION:  6150                         FACILITY:  [facility]   PHYSICIAN:  Casimiro Needle L. Reynolds, M.D.DATE OF BIRTH:  19-Apr-1991   DATE OF CONSULTATION:  09/21/2004  DATE OF DISCHARGE:                                   CONSULTATION   REQUESTING PHYSICIAN:  [facility].   REASON FOR EVALUATION:  Seizures.   HISTORY OF PRESENT ILLNESS:  This is an inpatient consultation visit for  this existing [facility] patient, a 20 year old male  with past medical history which includes mental retardation, developmental  delay who has been seen by Dr. Sharene Skeans in the remote past for that and more  recently in January and February of this year for a single generalized  seizure event.  His family member is at the bedside, indicated that he had a  work-up at that time including EEG, which is normal and CT and MRI, neither  of which he tolerated very well.  He has not been on medication.  The  patient reportedly was normal at home over the last couple of weeks except  for a bout of dehydration treated with intravenous fluids.  He had not been  coughing, running fever, or otherwise feeling unwell lately.  Today he was  found down at home and had an unwitnessed event.  He seemed confused as he  seemed to be acting as he has been after he had his last seizure.  He was  brought to the emergency room over concern that he had in fact had a seizure  and while here, had a witnessed generalized tonic clonic convulsion.  He has  not returned to his neurologic baseline since that time and presently is  being admitted for further evaluation of his prolonged postictal state.  According to his family, the patient's normal baseline is that he is awake,  somewhat interactive, can smile and laugh but doesn't really talk or follow  commands. He can walk around a  little bit with assistance.  He has been  coming increasingly close to that baseline since he has been present here in  the emergency room.   PAST MEDICAL HISTORY:  Remarkable for the developmental delay and single  seizure as noted above.  His parents indicate that he has had no other  chronic medical problems.   FAMILY HISTORY:  Specifically negative for seizures.   SOCIAL HISTORY:  Per admission H&P.  He is cared for by his mother and an  aunt who is also a Engineer, civil (consulting).   MEDICATIONS:  None.   PHYSICAL EXAMINATION:  VITAL SIGNS:  Blood pressure 171/84, pulse 98,  respiratory rate 12 and labored.  O2 sat 90% on 100% face mask.  GENERAL:  This is a chronically ill-appearing male youth supine on the  hospital bed in some respiratory distress.  HEENT:  Cranium normocephalic and atraumatic.  Oropharynx benign.  Neck is  supple.  NEUROLOGIC:  Mental status:  He is somewhat drowsy. He alerts to voice only  very briefly.  He doesn't really  regard the examiner.  He does not make  efforts at speech or follow commands.  He is active and somewhat agitated  trying to avoid his lines and telemetry leads being manipulated.  Cranial  nerves:  Pupils are small but reactive.  He seems to look to both sides  fairly well. Face, tongue and palate normally symmetric.  Motor: normal bulk  and tone.  He has good strength in all tested extremity muscles.  Sensation:  Withdraws to pain all extremity muscles.  Reflexes are symmetric.  Toes are  down going.   LABORATORY DATA:  CBC:  White count 17.5, hemoglobin 14.6, platelet count  272,000.  Coags are normal.  Routine chemistries demonstrate a fairly  significant acute respiratory acidosis.  We attempted to get a CT of his  head tonight but that could not be performed.  He was unable to tolerate  that.   IMPRESSION:  Generalized seizure times two this evening.  This event  confirms an underlying epilepsy.   PLAN:  He is presently being admitted to  pediatric service.  He has received  a fosphenytoin load and would agree with continuing that drug at least until  Dr. Sharene Skeans can evaluate him in the morning.  Would not pursue any further  tests at this time but would instead defer that to Dr. Sharene Skeans.  Thank you  for the consultation.      MLR/MEDQ  D:  09/21/2004  T:  09/22/2004  Job:  [id number]   cc:   Caroll Rancher, M.D.  [address]  [place]  [place] [address]  Fax: [phone]

## 2010-10-20 NOTE — Discharge Summary (Signed)
NAME:  Zachary [name], Zachary [name] NO.:  [id number]   MEDICAL RECORD NO.:  [id number]          PATIENT TYPE:  INP   LOCATION:  6114                         FACILITY:  [facility]   PHYSICIAN:  Johney Maine, M.D.   DATE OF BIRTH:  02-Mar-1991   DATE OF ADMISSION:  02/20/2006  DATE OF DISCHARGE:  02/20/2006                                 DISCHARGE SUMMARY   REASON FOR HOSPITALIZATION:  Seizures.   SIGNIFICANT FINDINGS:  This 20 year old African-American male with a history  of mental retardation and seizure disorder.  Last seizure was in June 2007.  The patient had two seizures, one at 10 p.m., one at 3:45 a.m. prior to this  admission.  There was no associated illness although there was emesis prior  to this seizure.  The patient received Ativan in the emergency room.   TREATMENT:  We increased the patient's Keppra dose from home dose 750 mg to  1000 mg b.i.d.  We also continued the home dose of Trileptal at 600 mg p.o.  b.i.d. which is the home dose.  Otherwise no other treatment was necessary  except for the fact that the patient received Ativan in the emergency room  to stop the seizures.   OPERATIONS AND PROCEDURES:  None.   FINAL DIAGNOSES:  1. Seizure disorder.  2. Mental retardation.   HOSPITAL COURSE:  This child was admitted after having a prolonged seizure  in the emergency room which required Ativan to halt.  Once admitted, the  child did not have any more seizures.  He acted at his baseline.  Post ictal  his baseline is usually very lethargic after he has his seizure.  He also  does not eat well.  He does not eat well during his hospital stay.  His  parents and grandparents state that this is normal for him.  Mom was  concerned about a sore throat for the child, however, a rapid strep test was  performed which was negative.  Child did well and was discharged in improved  and stable condition.   DISCHARGE MEDICATIONS AND INSTRUCTIONS:  1. Keppra 1000 mg p.o.  b.i.d.  2. Trileptal 600 mg p.o. b.i.d.  3. We also wrote a prescription for Diastat 10 mg rectal gel to be given      for seizures greater than 10 minutes.   PENDING RESULTS/ISSUES TO BE FOLLOWED:  The child is going to follow up with  his primary care Adaline Trejos, Dr. Maple Hudson, for general evaluation and for a new  systolic murmur.  Appointment scheduled for February 21, 2006 at 9:15 a.m.  The patient is also going to follow up with Dr. Sharene Skeans, his neurologist,  as previously scheduled.  Dr. Sharene Skeans did evaluate the child while in the  hospital and was the physician who changed his medicine.   DISCHARGE WEIGHT:  44 kg.   CONDITION ON DISCHARGE:  Improved and stable.   This written dictation was faxed to Dr. Maple Hudson and Dr. Sharene Skeans.  The patient  discharged in improved condition on the evening of February 20, 2006 with  aforementioned prescriptions and  appointments.           ______________________________  Johney Maine, M.D.     JT/MEDQ  D:  02/20/2006  T:  02/20/2006  Job:  [id number]   cc:   Deanna Artis. Sharene Skeans, M.D.  Ocie Doyne, M.D.

## 2010-10-20 NOTE — Op Note (Signed)
[place]. [facility]  Patient:    Zachary [name], Zachary [name]                          MRN: [id number] Proc. Date: 02/09/00 Adm. Date:  [id number] Disc. Date: [id number] Attending:  Jamelle Haring                           Operative Report  SURGEON:  Conley Simmonds, D.D.S.  ASSISTANT:  Elana Alm.  PREOPERATIVE DIAGNOSES: 1. Dental caries. 2. Dental pain. 3. Acute situational anxiety.  POSTOPERATIVE DIAGNOSES: 1. Dental caries. 2. Dental pain. 3. Acute situation anxiety.  TYPE OF OPERATION:  Restorative dentistry with one extraction and some tissue revision.  PROCEDURE:  The patient was brought to the operating room and anesthesia was begun using nasotracheal intubation.  Full mouth series dental x-rays were taken and visualized in the operating room.  Their findings were consistent with the clinical findings.  The child received complete prophylaxis and at the end of the procedure, a fluoride treatment using fluoride varnish.  The following teeth were found carious or in need of treatment and dealt with in the following manner:  Tooth 3:  OL amalgam restoration with Dycal base. Tooth 14: OL amalgam restoration with Dycal base. Tooth 23: MFD light cured acid etched bonded composite restoration. Tooth 24: DF light cured acid etched bonded composite restoration. Tooth 25: DF light cured acid etched bonded composite restoration. Tooth 26: MFD light cured acid etched bonded composite restoration. Tooth 30: Occlusal conservative composite restoration.  Tissue was removed around this tooth to allow its full eruption.  This tissue was quite inflamed. Tooth 19: Nonrestorable.  It was extracted.  Two 11 5-0 gut suture was used to control bleeding. Tooth 29 and 20 received sealants.  These were acid etched light cured Delton material.  At the end of the procedure, the oropharyngeal area was thoroughly evacuated. When no debris remained, the throat pack was  removed and the child was taken to the recovery room in good condition.  ESTIMATED BLOOD LOSS:  Less than 3 cc.  Approximately 1 1/2 carpules or about 2.5 cc of xylocaine 2% with epinephrine 1:100,000 was used to control bleeding in the extraction and tissue revision area.  The parents received a complete set of both verbal and written postoperative instructions.  The child also received a prescription for amoxicillin 250 mg per 5 ml dispense 150 ml two tsp stat and one tsp q.8h. until finished to cover any postoperative infection.  The justification for the use of the general anesthesia was that this child has inability to cooperate with this treatment in the routine dental office setting.  This child is a special needs child. DD:  02/09/00 TD:  02/11/00 Job: [id number]

## 2010-10-20 NOTE — Procedures (Signed)
PROCEDURE:  The tracing is carried out on a 32 digital Cadwell recorder  reformatted into 16-channel  montages with one devoted to EKG. The patient  was awake during the recording and active. The International 10/20 system  lead placement used. He takes no medication.   DESCRIPTION OF FINDINGS:  Dominant frequency is not well seen. The  background shows predominately under 20 microvolt theta range activity with  superimposed beta range components. From time to time up to 8 Hz of activity  can be seen in the occipital regions that would seem to be an alpha pattern.  However this is rarely seen. Very significant muscle movement artifact was  present throughout the record. There was no focal slowing. There was no  interictal epileptiform activity in the form of spikes or sharp waves.   EKG showed a sinus arrhythmia with ventricular response of 84 beats per  minute.   IMPRESSION:  Abnormal EEG on the basis of diffuse background slowing.  This  is a non-specific indicator of neuronal dysfunction that may be on a primary  degenerative basis but in this case is more likely related to the patient's  underlying static encephalopathy. The presence of postictal slowing  cannot  be discounted.      EAV:WUJW  D:  07/05/2004 19:04:57  T:  07/06/2004 06:39:17  Job #:  [id number]

## 2010-10-20 NOTE — Op Note (Signed)
[place]. [facility]  Patient:    Zachary [name], Zachary [name]                          MRN: [id number] Proc. Date: 02/09/00 Adm. Date:  [id number] Disc. Date: [id number] Attending:  Jamelle Haring                           Operative Report  TYPE OF OPERATION:  Restorative dentistry and extractions.  PREOPERATIVE DIAGNOSES: 1. Dental caries. 2. Acute situational anxiety.  POSTOPERATIVE DIAGNOSES: 1. Dental caries. 2. Acute situational anxiety.  SURGEON:  Conley Simmonds, D.D.S.  ASSISTANT:  Selena Batten and Carolanne. DD:  02/09/00 TD:  02/11/00 Job: [id number]

## 2010-10-20 NOTE — Procedures (Signed)
DATE:  07/05/2004   CLINICAL INFORMATION:  This 20 year old has a history of mental retardation  since birth has been thought recently to have had a grand mal seizure. No  medications were listed.   TECHNICAL DESCRIPTION:  This EEG was recorded during the awake state. The  background shows much muscle artifact, movement artifact, and biting  artifact. There is a low voltage fast beta and muscle artifact in the  background, but there is also some well formed alpha seen. No evidence of  any focal asymmetry or epileptiform activity is noted. There is no evidence  of any stage 2 sleep on this EEG. Photic stimulation was performed which did  not produce a driving response. Hyperventilation testing was not performed.   IMPRESSION:  This is a normal EEG during the awake state with much low  voltage fast beta activity suggestive of possibility of drug effect.      WJX:BJYN  D:  07/05/2004 12:54:02  T:  07/05/2004 13:09:27  Job #:  [id number]

## 2010-10-20 NOTE — Discharge Summary (Signed)
NAME:  Zachary [name], Zachary [name]                 ACCOUNT NO.:  [id number]   MEDICAL RECORD NO.:  [id number]          PATIENT TYPE:  INP   LOCATION:  6150                         FACILITY:  [facility]   PHYSICIAN:  RONDALL A. YOUNG, M.D. DATE OF BIRTH:  1990-08-14   DATE OF ADMISSION:  09/21/2004  DATE OF DISCHARGE:  09/27/2004                                 DISCHARGE SUMMARY   HOSPITAL COURSE:  1.  Neurology.  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  He also had an EEG done which      showed no seizure activity.  2.  Infectious disease.  Patient was diagnosed with a presumed aspiration      pneumonia and been treated with 6 days of clindamycin IV.  Patient      remained afebrile and improved while on antibiotics.  3.  Orthopedics.  Patient presented with a fracture of his left coracoid      process, was status post pinning of the fracture in the OR on September 26, 2004, tolerated this procedure without any problems and had adequate      pain control while in the hospital.   OPERATIONS AND PROCEDURES:  1.  EEG showed diffuse low voltage mild slowing, no seizures.  2.  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  3.  Status post screw in left coracoid process.  No complications.   DIAGNOSES:  1.  Autism.  2.  Developmental delay.  3.  Status post fracture and pinning of left coracoid process.  4.  Seizure disorder.   MEDICATIONS:  1.  Trileptal 300 mg half tablet p.o. b.i.d. x4 days then one tablet p.o.      b.i.d. x4 days then one and one half tablets p.o. b.i.d.  2.  Dilantin 300 mg p.o. at bedtime x2 weeks.  3.  Tylenol No. 3 one  pill p.o. q.4-6h. p.r.n. pain dispense #10.   DISCHARGE CONDITION:  Fair.   DISCHARGE INSTRUCTIONS AND FOLLOWUP:  Follow up with Dr. Carola Frost on Oct 10, 2004.  Follow up with Dr. Sharene Skeans whose office will call to make the  appointment.     /MEDQ  D:  09/27/2004  T:  09/27/2004  Job:  [id number]

## 2010-10-20 NOTE — Op Note (Signed)
NAME:  Zachary [name], Zachary [name] NO.:  [id number]   MEDICAL RECORD NO.:  [id number]          PATIENT TYPE:  OIB   LOCATION:  6125                         FACILITY:  [facility]   PHYSICIAN:  Hinton Dyer, D.D.S.DATE OF BIRTH:  1991/01/31   DATE OF PROCEDURE:  01/25/2005  DATE OF DISCHARGE:  01/26/2005                                 OPERATIVE REPORT   PREOPERATIVE DIAGNOSES:  1.  Moderate-to-severe autism.  2.  Odontogenic tumor of right mandible.  3.  Impacted teeth, #1, 16, 17, 31 and 32.   POSTOPERATIVE DIAGNOSES:  1.  Moderate-to-severe autism.  2.  Odontogenic tumor of right mandible.  3.  Impacted teeth, #1, 16, 17, 31 and 32.   PROCEDURE:  1.  Surgical removal of all full bony-impacted teeth.  2.  Removal of odontogenic tumor.   SURGEON:  Hinton Dyer, D.D.S.   ASSISTANTS:  1.  Ray Montez Morita.  2.  Lupita Leash __________.   ANESTHESIA:  General.   ESTIMATED BLOOD LOSS:  Approximately 50 mL.   CONDITION AT END OF SURGERY:  Good.   DESCRIPTION OF PROCEDURE:  Following preoperative medication, the patient  was brought in a supine position, in which he remained throughout the whole  procedure.  He was intubated via a right nasoendotracheal tube and prepped  and draped in usual fashion for an intraoral procedure.  The mouth was  suctioned out and moist open 4 x 4 gauze was packed around the endotracheal  tube.  Xylocaine 2% with 1:100,000 epinephrine was infiltrated as a block on  both sides and in the maxilla and palate; a total of 6 mL was given.  With a  child-size bite block in place on the right side, a 15 blade was used to  make an incision over the left tuberosity and a full-thickness flap was  elevated with the periosteal elevator.  Occlusal buccal and distal bone was  removed with a rongeur and the tooth was visualized.  The tooth was then  elevated out of the socket with an 11-A elevator and the socket was curetted  and closed with a 3-0 chromic suture.   Attention was then turned to the left  mandible, where a 15 blade made an incision over the left retromolar pad  with a small release on the distal buccal aspect of tooth #18.  A full-  thickness mucoperiosteal flap was elevated with a periosteal elevator.  Occlusal buccal and distal bone was removed with a Brown bur and copious  irrigation, and the tooth was elevated out of the socket with an 11-A  elevator.  The socket was the curetted, irrigated and closed with a 3-0  chromic suture.  The bite block was switched and a 15 blade made an incision  over the crest of the ridge and around the necks of teeth #30 and 29.  A  full-thickness mucoperiosteal flap was elevated.  Occlusal buccal and distal  bone was removed with a round bur and copious irrigation, and smooth off  with a rongeur.  Both teeth were visualized and the soft tissue covering  them was visualized.  The soft tissue was removed with a rongeur and a  curette, and tooth #32 was elevated out of the socket with an 11-A elevator.  Tooth #31 was sectioned with a cross-cut fissure bur and copious irrigation,  and then the parts were removed with an 11-A elevator.  Both sockets were  curetted, irrigated and closed with multiple 3-0 chromic sutures.  A 15  blade made an incision over the right tuberosity and a full-thickness  mucoperiosteal flap elevated a full-thickness flap.  Occlusal buccal and  distal bone was removed with a rongeur and the tooth was visualized.  The  tooth was then elevated out of the socket with an 11-A elevator and the  socket was curetted and closed with a 3-0 chromic suture.  The mouth was  suctioned and packed off, and the throat pack was removed.  The patient had  his endotracheal tube removed in the OR and then he was returned to the  recovery room in good condition.   He will be kept overnight in a 23-hour observation bed and be given a  prescription for Vicodin or Lortab elixir one or two teaspoons q.4 h.  p.r.n.  pain and he will be followed up by me in my private office.           ______________________________  Hinton Dyer, D.D.S.     JLM/MEDQ  D:  01/25/2005  T:  01/26/2005  Job:  [id number]

## 2010-10-20 NOTE — Discharge Summary (Signed)
NAME:  Zachary [name], Zachary [name] NO.:  [id number]   MEDICAL RECORD NO.:  [id number]          PATIENT TYPE:  INP   LOCATION:  5022                         FACILITY:  [facility]   PHYSICIAN:  Loreta Ave, M.D. DATE OF BIRTH:  1991-02-17   DATE OF ADMISSION:  06/13/2008  DATE OF DISCHARGE:  06/16/2008                               DISCHARGE SUMMARY   FINAL DIAGNOSES:  1. Status post open reduction and internal fixation left hip for      femoral neck fracture.  2. Cerebral palsy.  3. Seizures.   HISTORY OF PRESENT ILLNESS:  An 20 year old black male with history of  cerebral palsy and seizure disorder fell down 12 steps at this home.  Mother states that she heard a pop at the time of the injury.  He was  unable to weightbear after this incident.  He is brought to the [facility] Emergency Room and x-rays were taken which showed a left hip  displaced femoral neck fracture.   HOSPITAL COURSE:  On June 13, 2008 after evaluation in the emergency  room, the patient was taken to the [facility] OR and an ORIF left hip  procedure performed.  Surgeon Mckinley Jewel, MD and assistant Zonia Kief, PA-C.  Anesthesia general.  No specimens.  EBL minimal.  There were no surgical or anesthesia complications and the  patient was transferred to recovery in stable condition. June 14, 2008 per his mother, [name]  was very agitated.  Itching with Dilaudid.  Temperature 101, pulse 120, respirations 18, blood pressure 149/80.  Dressing clean, dry, and intact.  Discontinued Dilaudid and used  morphine 2 mg IV q.2-3 h. p.r.n. for pain.  PT for bed to chair  transfers, nonweightbearing to left lower extremity if possible.  The  patient was also followed by his pediatrician.  On June 15, 2008 the  patient more comfortable after starting Ativan for the agitation.  Temperature 100.6, pulse 112, respirations 18, blood pressure 141/83.  Hemoglobin 12.3, hematocrit 37.3.  Electrolytes stable.   Wound looks  good and staples intact.  No drainage or signs of infection.  He had an  abnormal UA preop and urine culture were done.  June 16, 2008 the  patient comfortable.  Vital signs stable, afebrile.  Hemoglobin 12.7,  hematocrit 38.3, WBC 5.0, platelets 152.  Sodium 138, potassium 3.8,  chloride 102, CO2 28, BUN 3, creatinine of 0.65, glucose 95.  Wound  looks good.  Staples intact.  No drainage or signs of infection.  The  patient is stable and his mother is ready for him to discharge home  today.   DISPOSITION:  Discharge home.   CONDITION:  Stable.   MEDICATIONS:  Norco 5/325 one to two tablets p.o. q.4-6 h. p.r.n. for  pain.  Resume previous home meds.   INSTRUCTIONS:  The patient must be nonweightbearing as much as possible  though it was explained to his mother that we know that it will be  difficult due to his other medical issues.  Okay to shower.  Follow up  in the  office when he is at 2 weeks postop for recheck.  Return sooner  if needed.      Genene Churn. Denton Meek.      Loreta Ave, M.D.  Electronically Signed    JMO/MEDQ  D:  07/26/2008  T:  07/27/2008  Job:  [id number]

## 2010-10-20 NOTE — Consult Note (Signed)
NAME:  Zachary [name], Zachary [name] NO.:  [id number]   MEDICAL RECORD NO.:  [id number]          PATIENT TYPE:  INP   LOCATION:  6150                         FACILITY:  [facility]   PHYSICIAN:  Doralee Albino. Carola Frost, M.D. DATE OF BIRTH:  02-Aug-1990   DATE OF CONSULTATION:  09/24/2004  DATE OF DISCHARGE:                                   CONSULTATION   CONSULTING PHYSICIAN:  Doralee Albino. Carola Frost, M.D.   REQUESTING PHYSICIAN:  Rondall A. Maple Hudson, M.D.   REASON FOR CONSULTATION:  Left shoulder re-injury, status post seizure.   BRIEF HISTORY OF PRESENTATION:  Zachary [name] is a 20 year old black male with  a seizure disorder and severe autism who was treated in the past by Dr.  Charlett Blake for a left shoulder dislocation which according to the mother was  anterior.  He experiences tonic/clonic seizures and was noted postictal to  have guarding of the left shoulder which has been persistent.  Initial x-  rays were read as suspicious for acromion fracture.   PAST MEDICAL HISTORY:  1.  Severe autism.  2.  Seizure disorder with tonic/clonic events.   FAMILY HISTORY:  Noncontributory.   SOCIAL HISTORY:  The patient is cared for by his mother and aunt who is also  a Engineer, civil (consulting).   MEDICATIONS:  Trileptal, phenytoin, morphine, clindamycin, Tylenol, and  MiraLax.   ALLERGIES:  None.   PHYSICAL EXAMINATION:  GENERAL:  Zachary [name] appears comfortable.  He is a normal  size for his age.  MUSCULOSKELETAL:  He tends to hold the left upper extremity at his side and  the elbow flexed.  He does demonstrate full motor function of the hand and  wrist with a demonstration of radial, medial, and ulnar motor function.  He  also demonstrates musculocutaneous motor function.  He does not have any  point tenderness of the acromion, or of the clavicle, or of the [facility] joint.  He does have a significant point tenderness of the corticoid process.  His  shoulder can be internally rotated 90 degrees and externally rotated 30  degrees.   The contour of his shoulder appears normal.  He does not have any  focal tenderness or evidence of injury at the elbow or at the wrist.  Radial  pulses 2+.  Contralateral shoulder ranges freely and there is no evidence of  focal tenderness, ecchymosis, or swelling of the shoulder, elbow, or wrist  on the contralateral side.   X-rays were reviewed both from this admission as well as a previous one in  early February.  The early February x-ray was notable for multiple physes  about the shoulder including the acromion and base of the corticoid.  This  was a linear line without any displacement at the base of the corticoid in  the early February x-ray.  Followup films obtained a day ago demonstrate  continued physes at the acromion as well as increased width of the physes at  the base of the corticoid.  There does not appear to be a dislocation on the  AP and scapular Y views.  There is slight suboptimal  positioning of the  scapular Y view making it difficult to definitively determine whether the  shoulder is located.  Axillary view has not yet been obtained.   ASSESSMENT:  Based on the absence of focal tenderness of the acromion and  the similar appearance of the acromial physes at this admission and his  prior film, suggest he does not have a fracture of his acromion which is  acute.  The change in the appearance of the physes at the base of the  corticoid and its correlation with tenderness on physical examination  suggests a fracture at the base of the corticoid which could be chronic or  recurrent.  No axillary view of the left shoulder.   PLAN:  1.  I have ordered an axillary view of the left shoulder be obtained to      confirm that Mareon's left shoulder is located.  His clinically      examination suggests that it is.  His tendency to hold his left elbow      flexed, and his tenderness, and his x-ray findings seem to suggest a      fracture through the base of his corticoid that is  minimally displaced.      He may need a followup CT scan to more clearly delineate the nature of      this injury and to plan treatment accordingly.  2.  We will followup once these other imaging studies have been obtained.      MHH/MEDQ  D:  09/24/2004  T:  09/24/2004  Job:  [id number]

## 2010-10-20 NOTE — Op Note (Signed)
[place]. [facility]  Patient:    Zachary [name], Zachary [name]                          MRN: [id number] Proc. Date: 09/03/00 Adm. Date:  [id number] Attending:  Carlos Levering CC:         Anne B. Brooke Dare, M.D.                           Operative Report  PREOPERATIVE DIAGNOSIS: 1. Ingrown toenail, right great toe. 2. History of chronic edema.  POSTOPERATIVE DIAGNOSIS: 1. Ingrown toenail, right great toe. 2. History of chronic edema.  OPERATION PERFORMED: 1. Excision of ingrown toenail, right great toe. 2. Catheterization for urinalysis and venipuncture for drawing blood for    CBC and CMET.  SURGEON:  Prabhakar D. Levie Heritage, M.D.  ASSISTANT:  Nurse.  ANESTHESIA:  Nurse.  DESCRIPTION OF PROCEDURE:  Under satisfactory general anesthesia, patient in supine position, right foot and region was thoroughly prepped and draped in the usual manner. A small tourniquet was applied at the base of the right great toe.  0.25% Marcaine was infiltrated for postoperative analgesia.  A vertical incision was made around the inflamed tissue of the medial edge of the right great toenail.  One third of the nail together with the matrix was excised.  The base of the nail was curetted with curets.  Electrocautery was used to accomplish hemostasis.  The area was cleansed.  Neosporin and pressure dressing applied.  The patients general condition being satisfactory, catheterization was carried out. A small quantity of urine was collected for urinalysis and CBC and CMET were drawn in order to investigate for the history of chronic edema. The patient withstood the procedure well and was transferred to the recovery room in satisfactory general condition. DD:  09/03/00 TD:  09/03/00 Job: [id number]

## 2010-10-20 NOTE — Procedures (Signed)
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 The patient had one unwitnessed and  one witnessed generalized tonic-clonic seizure yesterday. The study is being  done look for the presence of a seizure disorder.   PROCEDURE:  The tracing is carried out on a 32-channel digital Cadwell  recorder reformatted to 16-channel montages with one devoted to EKG. The  patient was awake and active during the recording. The International 10-20  system lead placement used. Medications include fosphenytoin and  clindamycin.   DESCRIPTION OF FINDINGS:  The background is a predominately under 20  microvolt beta range activity of 15-20 Hz. Seven to 8 Hz alpha range  activity can be seen in the posterior regions from time to time.  Considerable muscle movement artifact was seen in the record. There was no  focal slowing. There was no interictal epileptiform activity form of spikes  or sharp waves. Activating procedures caused a driving response at about 15  Hz, was not seen another lower frequencies. Hyperventilation could not be  carried out. EKG showed a regular sinus rhythm with ventricular response of  90 beats per minute.   IMPRESSION:  Borderline EEG, extremely low voltage, considerable beta range  activity related to benzodiazepine use. No evidence of seizure activity.  This is likely mildly slow on the basis of the patient's underlying  encephalopathy and postictal state.      ZOX:WRUE  D:  09/22/2004 11:37:15  T:  09/22/2004 16:29:36  Job #:  [id number]

## 2010-10-20 NOTE — Consult Note (Signed)
[place]. [facility]  Patient:    Zachary [name], Zachary [name]                        MRN: [id number] Adm. Date:  [id number] Attending:  Doug Sou                          Consultation Report  HISTORY OF PRESENT ILLNESS:  Zachary [name] is a 20 year old autistic male who the mother reports falls quite a bit.  Evidently, it sounds like he has had a patellar dislocation in the past.  She states that he stood up and spontaneously reduced.  However, today he was running and fell.  She notes he had left knee pain and deformity.  He was brought to the [facility] Emergency Room, where he was evaluated there by Zachary [name], the physician assistant, thought to have a probable patellar dislocation.  I was asked to see the patient.  PHYSICAL EXAMINATION:  GENERAL:  I evaluated the patient.  He is lying on his bed on the side.  He is autistic, has severe autism, was not responding to commands.  Mother was with him.  EXTREMITIES:  Examination revealed a hypermobile patella on the right side. On the left side, he had an obvious lateral patellar dislocation.  Distal neurovascular and distal pulses were intact.  There appeared to be no other abnormality clinically.  PROCEDURE NOTE:  After informed consent from the mother and she asked me to go ahead and reduce the patella, I proceeded with a gentle closed reduction by medial displacement of the patella and extension of the leg with a palpable reduction, which was gentle and atraumatic.  At this time, the patient had full functional range of motion, no palpable abnormalities or crepitation.  He did have a small effusion.  Pulses remained intact.  X-rays are pending.  IMPRESSION:  Patient with left knee lateral patellar dislocation.  RECOMMENDATION:  Check x-rays.  Follow up on him as necessary.  A Jones wrap, knee immobilizer, weightbear as tolerated.  Follow up in the office in one to two weeks for repeat examination and  evaluation.  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 Definitive treatment discussed with the mother in the office later.  Would also give him something for discomfort, Tylenol No. 3 for pain at home.DD: 04/04/00 TD:  04/05/00 Job: [id number]

## 2010-10-20 NOTE — Op Note (Signed)
NAME:  Zachary [name], Zachary [name] NO.:  [id number]   MEDICAL RECORD NO.:  [id number]          PATIENT TYPE:  INP   LOCATION:  6150                         FACILITY:  [facility]   PHYSICIAN:  Doralee Albino. Carola Frost, M.D. DATE OF BIRTH:  1990/06/08   DATE OF PROCEDURE:  09/26/2004  DATE OF DISCHARGE:                                 OPERATIVE REPORT   PREOPERATIVE DIAGNOSIS:  Left coracoid delayed/nonunion.   POSTOPERATIVE DIAGNOSIS:  Left coracoid delayed/nonunion.   PROCEDURE:  Open reduction internal fixation of left coracoid.   SURGEON:  Myrene Galas, M.D.   ASSISTANT:  Peter Garter.   ANESTHESIA:  General.   COMPLICATIONS:  None.   HARDWARE:  One Synthes 4.5 x 40 mm partially threaded cannulated screw.   DISPOSITION:  To MRI.   CONDITION:  Stable.   BRIEF SUMMARY OF INDICATION FOR PROCEDURE:  Zachary [name] is a 20 year old  black male with a prior history of left shoulder injury and dislocation.  He  was also noted to have a nondisplaced coracoid fracture on x-rays 2-1/2  months ago.  He did well with conservative management and was returning to  full activities including swimming when a grand mal seizure resulted in  recurrence and exacerbation of left shoulder pain.  X-rays demonstrated some  chronic changes at the base of the coracoid as well as increased  displacement of the fracture.  There was no dislocation noted.  Hill-Sachs  deformity was also present.  After a discussion of the risks and benefits of  surgery vs. further expected management, the patient's mother wished to  proceed.  She understood the risks to include injury to nerves, vessels,  possibility of continued nonunion or delayed union as well as the  possibility of infection and need for hardware removal or subsequent  procedures.  I also spoke with the neuroradiologist regarding the safety of  obtaining an MRI of the brain after placement of the coracoid Titanium  screw.  He agreed to proceed  with the scan under the same intubation.  This  was particularly important because of the patient's history of aspiration.   BRIEF SUMMARY OF PROCEDURE:  Zachary [name] was administered 1 g of IV Ancef, taken to  the operating room where his left shoulder was prepped and draped in the  usual sterile fashion.  A small stab incision was made at the apex of his  coracoid process and a hemostat used to spread the soft tissues.  A guidepin  for the cannulated screw was then inserted perpendicular to the fracture  site down into the scapula.  Multiple x-rays were obtained to ensure  appropriate screw placement.  These showed interosseous screw placement as  well as correct orientation with regard to compression of the fracture site.  It was measured after using the countersink device and then inserted,  achieving compression at the fracture site.  It was over drilled, of course,  prior to being placed.  The wound was then irrigated and closed with a  Monocryl inverted suture and a Steri-Strip.  The patient was then taken to  MRI after  application of a sterile dressing.   PROGNOSIS:  Zachary [name]'s prognosis with this fracture is good.  Unfortunately,  because of his severe autism and seizure disorder it will be difficult to  control his compliance with restrictions in the postoperative period.  On  the other hand, he does not have high demand and if seizure is maintained or  able to be controlled then it is unlikely sufficient force will disrupt his  repair.  That said, he still is at increased for delayed nonunion.  We do  not suspect a neurovascular injury at this time.  We will provide him a  sling after he has obtained his MRI scan.      MHH/MEDQ  D:  09/26/2004  T:  09/26/2004  Job:  [id number]

## 2010-11-05 ENCOUNTER — Ambulatory Visit (INDEPENDENT_AMBULATORY_CARE_PROVIDER_SITE_OTHER): Payer: Medicaid Other | Admitting: Pediatrics

## 2010-11-05 DIAGNOSIS — M25619 Stiffness of unspecified shoulder, not elsewhere classified: Secondary | ICD-10-CM

## 2010-11-05 DIAGNOSIS — M2560 Stiffness of unspecified joint, not elsewhere classified: Secondary | ICD-10-CM

## 2010-11-05 DIAGNOSIS — Z22322 Carrier or suspected carrier of Methicillin resistant Staphylococcus aureus: Secondary | ICD-10-CM | POA: Insufficient documentation

## 2010-11-05 DIAGNOSIS — N390 Urinary tract infection, site not specified: Secondary | ICD-10-CM

## 2010-11-05 DIAGNOSIS — F840 Autistic disorder: Secondary | ICD-10-CM

## 2010-11-05 DIAGNOSIS — IMO0001 Reserved for inherently not codable concepts without codable children: Secondary | ICD-10-CM

## 2010-11-05 DIAGNOSIS — G40319 Generalized idiopathic epilepsy and epileptic syndromes, intractable, without status epilepticus: Secondary | ICD-10-CM

## 2010-11-05 DIAGNOSIS — R6250 Unspecified lack of expected normal physiological development in childhood: Secondary | ICD-10-CM

## 2010-11-05 DIAGNOSIS — M25659 Stiffness of unspecified hip, not elsewhere classified: Secondary | ICD-10-CM

## 2010-11-05 DIAGNOSIS — M791 Myalgia, unspecified site: Secondary | ICD-10-CM

## 2010-11-05 NOTE — Progress Notes (Signed)
Just d/c from [facility], emu for seizures, found epileptogenic focus in L frontal with diag medically intractable seizures, off meds during monitoring with about 7 total seizures. Since d/c on Friday will not eat or walk, drinking. When mom stood him r leg trembles, archs when sitting  PE active, usual self with rythmic rocking and humming  HEENT, tms clear CVS rr Lungs clear.  Neuro/Ortho FROM active and passive R, L increased resistance to passive movement of L shoulder able to raise(abduct) to shoulder level but resists beyond, no point tenderness, ant-post movement better. L hip resists int and external rotation passively but can get movement. Able to stand and support wt with assistance from mother and me. Difficulty lowering self into chair (falls).  ASS muscle weakness/soreness due to seizures.joint stiffness Plan spoke with Neuro on call at [facility].( dr Inez Catalina) ok to give relaxant if needed and antiinflammatory/pain meds   Try to get PT in AM

## 2010-11-06 ENCOUNTER — Telehealth: Payer: Self-pay | Admitting: Pediatrics

## 2010-11-06 NOTE — Telephone Encounter (Signed)
Discussed PT with mother will try to set up. [facility] needs to send request for bed.

## 2010-11-06 NOTE — Telephone Encounter (Signed)
Mother would like to know if PT could be done at home(very hard to move him)Also..needs hospital bed ordered from [facility].

## 2010-11-07 ENCOUNTER — Other Ambulatory Visit: Payer: Self-pay | Admitting: Pediatrics

## 2010-11-07 DIAGNOSIS — R569 Unspecified convulsions: Secondary | ICD-10-CM

## 2010-11-07 NOTE — Telephone Encounter (Signed)
Referral made to [facility] for hospital bed and PT.

## 2010-11-22 ENCOUNTER — Telehealth: Payer: Self-pay

## 2010-11-22 ENCOUNTER — Other Ambulatory Visit: Payer: Self-pay | Admitting: Pediatrics

## 2010-11-22 DIAGNOSIS — I10 Essential (primary) hypertension: Secondary | ICD-10-CM

## 2010-11-22 MED ORDER — AMLODIPINE BESYLATE 2.5 MG PO TABS: ORAL_TABLET | ORAL | Status: DC

## 2010-11-22 NOTE — Telephone Encounter (Signed)
Seen by neuro Dr Sharene Skeans Zachary [name]), stopped lisinopril because of cough and urinary retention now bp back to 145-150/85-100 wiil check with nephro for alternative Medication  Spoke with ped nephro, [facility] Dr Imogene Burn, suggested norvasc will start at 2.5 qd increase to 5 and max 10 if needed

## 2010-11-22 NOTE — Telephone Encounter (Signed)
Lisinopril caused cough and urinary retention.  Dr. Darl Householder office had mom DC this med.  BP is creeping back up.  Please call mom to discuss.

## 2010-12-04 ENCOUNTER — Telehealth: Payer: Self-pay | Admitting: Pediatrics

## 2010-12-04 ENCOUNTER — Other Ambulatory Visit: Payer: Self-pay | Admitting: Pediatrics

## 2010-12-04 DIAGNOSIS — R1110 Vomiting, unspecified: Secondary | ICD-10-CM

## 2010-12-04 MED ORDER — ONDANSETRON 4 MG PO TBDP: ORAL_TABLET | ORAL | Status: DC

## 2010-12-04 NOTE — Progress Notes (Signed)
Vomited his meds will give ondansatron 4mg  odt and retry meds spoke with pharmacist listed [facility]dp want odt

## 2010-12-04 NOTE — Telephone Encounter (Signed)
BP meds you put him on He has stopped urinating. Sunday she did not give him the pill and her started urinating again. Should she give him the pill today?

## 2010-12-04 NOTE — Telephone Encounter (Signed)
Loose stools and vomiting today. Urine retention on norvasc like lisinopril. Need to talk to nephro, not sure who she saw. pedialyte to brat for stools and vomiting

## 2010-12-04 NOTE — Telephone Encounter (Signed)
MOM CALLED BACK STATING THAT Zachary [name] HAS STARTED VOMITING AND HAS DIARRHEA. SHE WANTS TO KNOW WHAT SHE CAN GIVE HIM. ALSO WANTS YOU TO KNOW THAT HE HAS TO TAKE SIEZURE MEDS AT 7:00 P.M;. TONIGHT. I SUGGESTED FOR HER TO BRING Zachary [name] IN BUT SHE DOES NOT WANT TO DO THAT.

## 2010-12-07 ENCOUNTER — Telehealth: Payer: Self-pay | Admitting: Pediatrics

## 2010-12-07 DIAGNOSIS — R339 Retention of urine, unspecified: Secondary | ICD-10-CM

## 2010-12-07 NOTE — Telephone Encounter (Signed)
Still with urinary retention, large amt  2x day. Spoke with nephro,dr fox neither norvasc nor lissenopril is known to cause this. Thinks probable urology problem, will try to see dr Elana Alm at [facility] ( previously seen by Dr Marye Round who has left)

## 2010-12-07 NOTE — Telephone Encounter (Signed)
Appt made with urology for 12/08/2010 @ 10 am Dr. Suzi Roots mom aware of appt.

## 2010-12-07 NOTE — Telephone Encounter (Signed)
Addended by: Consuella Lose C on: 12/07/2010 04:10 PM   Modules accepted: Orders

## 2010-12-07 NOTE — Telephone Encounter (Signed)
Dr. Maple Hudson ordered a urology referral.  Appt with  Dr. Suzi Roots @ 10 am 12/08/2010 (Dr. Isabel Caprice next appt not til 8/23)   @ [facility].  Mom aware of appt.

## 2010-12-25 ENCOUNTER — Telehealth: Payer: Self-pay | Admitting: Pediatrics

## 2010-12-25 NOTE — Telephone Encounter (Signed)
BP running 130/95 7/22 was 120/97 stiles had 3 seizures in 1 day changed his meds mom is concerned ? Start back blood pressure meds

## 2010-12-26 NOTE — Telephone Encounter (Signed)
Needs to restart norvasc. will talk to dr Isabel Caprice on Thursday. Spoke with mother on 7/23

## 2010-12-27 ENCOUNTER — Ambulatory Visit (INDEPENDENT_AMBULATORY_CARE_PROVIDER_SITE_OTHER): Payer: Medicaid Other | Admitting: Nurse Practitioner

## 2010-12-27 DIAGNOSIS — K5900 Constipation, unspecified: Secondary | ICD-10-CM

## 2010-12-27 DIAGNOSIS — K5909 Other constipation: Secondary | ICD-10-CM

## 2010-12-27 NOTE — Progress Notes (Signed)
Subjective:     Patient ID: Zachary [name], male   DOB: [birth year], 20 y.o.   MRN: [id number]  HPI  Mother noticed stomach looked full with last BM about 48 hours ago.  Seen for evaluation of urinary retention possibly related to blood pressure medicine by [facility].  Follow up testing ( ? [facility]) pending.  Dr. Maple Hudson has been following.  Nakul has long standing problems with constipation.  Mom gives Miralax dailly, uses prune juice and sometimes other methods to encourage stooling (rectal stimulation).  She reports that Zayveon has had a seizure after enemas.  Had a good morning void this am  Review of Systems  All other systems reviewed and are negative.       Objective:   Physical Exam (performed by Dr. Maple Hudson).  Abdomen is soft and non tender.  Stool mass detected but bladder not distended.  Jahmani looks good.      Assessment:    Chronic Constipation         Plan:    Plan reviewed with mom.  She will try glycerin suppository next few days.  Call increase symptoms or concerns.

## 2011-02-18 ENCOUNTER — Emergency Department (HOSPITAL_COMMUNITY): Payer: Medicaid Other

## 2011-02-18 ENCOUNTER — Emergency Department (HOSPITAL_COMMUNITY)
Admission: EM | Admit: 2011-02-18 | Discharge: 2011-02-18 | Disposition: A | Discharge status: Home / Self Care | Payer: Medicaid Other | Attending: Emergency Medicine | Admitting: Emergency Medicine

## 2011-02-18 DIAGNOSIS — J40 Bronchitis, not specified as acute or chronic: Secondary | ICD-10-CM | POA: Insufficient documentation

## 2011-02-18 DIAGNOSIS — I10 Essential (primary) hypertension: Secondary | ICD-10-CM | POA: Insufficient documentation

## 2011-02-18 DIAGNOSIS — R5381 Other malaise: Secondary | ICD-10-CM | POA: Insufficient documentation

## 2011-02-18 DIAGNOSIS — G809 Cerebral palsy, unspecified: Secondary | ICD-10-CM | POA: Insufficient documentation

## 2011-02-18 DIAGNOSIS — R509 Fever, unspecified: Secondary | ICD-10-CM | POA: Insufficient documentation

## 2011-02-18 DIAGNOSIS — F79 Unspecified intellectual disabilities: Secondary | ICD-10-CM | POA: Insufficient documentation

## 2011-02-18 LAB — CBC
Hemoglobin: 13.4 g/dL (ref 13.0–17.0)
MCV: 82.4 fL (ref 78.0–100.0)
RBC: 4.78 MIL/uL (ref 4.22–5.81)
RDW: 12.3 % (ref 11.5–15.5)

## 2011-02-18 LAB — COMPREHENSIVE METABOLIC PANEL
ALT: 20 U/L (ref 0–53)
AST: 17 U/L (ref 0–37)
Albumin: 3.8 g/dL (ref 3.5–5.2)
Alkaline Phosphatase: 65 U/L (ref 39–117)
BUN: 15 mg/dL (ref 6–23)
Chloride: 99 mEq/L (ref 96–112)
Potassium: 3.6 mEq/L (ref 3.5–5.1)
Sodium: 138 mEq/L (ref 135–145)
Total Bilirubin: 0.2 mg/dL — ABNORMAL LOW (ref 0.3–1.2)

## 2011-02-18 LAB — DIFFERENTIAL
Basophils Relative: 0 % (ref 0–1)
Eosinophils Absolute: 0 10*3/uL (ref 0.0–0.7)
Lymphs Abs: 1.3 10*3/uL (ref 0.7–4.0)
Monocytes Absolute: 0.8 10*3/uL (ref 0.1–1.0)
Monocytes Relative: 11 % (ref 3–12)
Neutrophils Relative %: 71 % (ref 43–77)

## 2011-02-18 LAB — URINALYSIS, ROUTINE W REFLEX MICROSCOPIC
Glucose, UA: NEGATIVE mg/dL
Hgb urine dipstick: NEGATIVE
Ketones, ur: >80 mg/dL — AB
pH: 6 (ref 5.0–8.0)

## 2011-02-18 LAB — LIPASE, BLOOD: Lipase: 21 U/L (ref 11–59)

## 2011-02-19 ENCOUNTER — Telehealth: Payer: Self-pay

## 2011-02-19 NOTE — Telephone Encounter (Signed)
Seen in ER yesterday and dx with bronchitis.  Mom wants to know what she should use for stuffy nose and fever.

## 2011-02-19 NOTE — Telephone Encounter (Signed)
Seen er put on azithro has stuffy try claritin and sudafed, irff not improved- flonase

## 2011-02-23 ENCOUNTER — Telehealth: Payer: Self-pay | Admitting: Pediatrics

## 2011-02-23 NOTE — Telephone Encounter (Signed)
T/C from mother,child very stopped up,saline drops not helping.Please call

## 2011-02-28 LAB — POCT I-STAT, CHEM 8
BUN: 16
Calcium, Ion: 1.18
Chloride: 102
Creatinine, Ser: 1
Glucose, Bld: 110 — ABNORMAL HIGH
Potassium: 3.9

## 2011-03-01 LAB — CULTURE, BLOOD (SINGLE)

## 2011-03-01 LAB — CBC
HCT: 38.1 — ABNORMAL LOW
Hemoglobin: 13.3
MCV: 85
Platelets: 144 — ABNORMAL LOW
RDW: 12.8

## 2011-03-01 LAB — POCT I-STAT, CHEM 8
Creatinine, Ser: 1
Glucose, Bld: 84
HCT: 43
Hemoglobin: 14.6
Potassium: 4.1
TCO2: 25

## 2011-03-01 LAB — DIFFERENTIAL
Basophils Absolute: 0
Eosinophils Absolute: 0
Eosinophils Relative: 0
Monocytes Absolute: 0.7

## 2011-03-01 LAB — WOUND CULTURE

## 2011-03-02 LAB — CBC
HCT: 41.4
Hemoglobin: 13.7
MCHC: 33
MCV: 85
RBC: 4.87
WBC: 3.8 — ABNORMAL LOW

## 2011-03-02 LAB — COMPREHENSIVE METABOLIC PANEL
Alkaline Phosphatase: 50
BUN: 15
Chloride: 103
GFR calc non Af Amer: >60
Glucose, Bld: 107 — ABNORMAL HIGH
Potassium: 4.1
Total Bilirubin: 0.7

## 2011-03-02 LAB — URINALYSIS, ROUTINE W REFLEX MICROSCOPIC
Nitrite: NEGATIVE
Specific Gravity, Urine: 1.033 — ABNORMAL HIGH
Urobilinogen, UA: 1

## 2011-03-02 LAB — DIFFERENTIAL
Basophils Absolute: 0
Basophils Relative: 1
Neutro Abs: 1.8
Neutrophils Relative %: 48

## 2011-03-02 LAB — URINE CULTURE: Colony Count: NO GROWTH

## 2011-03-06 LAB — CBC
HCT: 41.2
Hemoglobin: 13.6
MCHC: 33.1
MCV: 83.6
Platelets: 170
RDW: 12.4

## 2011-03-06 LAB — URINALYSIS, ROUTINE W REFLEX MICROSCOPIC
Protein, ur: 30 — AB
Urobilinogen, UA: 1

## 2011-03-06 LAB — LEVETIRACETAM LEVEL: Levetiracetam Lvl: 37.9 ug/mL

## 2011-03-06 LAB — DIFFERENTIAL
Basophils Absolute: 0
Eosinophils Absolute: 0
Eosinophils Relative: 0
Monocytes Absolute: 0.7

## 2011-03-06 LAB — COMPREHENSIVE METABOLIC PANEL
AST: 19
BUN: 13
CO2: 27
Calcium: 9.1
Creatinine, Ser: 0.64
GFR calc Af Amer: >60
GFR calc non Af Amer: >60

## 2011-03-06 LAB — CULTURE, BLOOD (ROUTINE X 2): Culture: NO GROWTH

## 2011-03-06 LAB — URINE CULTURE

## 2011-03-06 LAB — URINE MICROSCOPIC-ADD ON

## 2011-03-16 LAB — COMPREHENSIVE METABOLIC PANEL
ALT: 18
AST: 27
CO2: 29
Calcium: 9.4
Chloride: 102
Creatinine, Ser: 0.63
Glucose, Bld: 79
Sodium: 137
Total Bilirubin: 0.7

## 2011-03-16 LAB — URINALYSIS, ROUTINE W REFLEX MICROSCOPIC
Bilirubin Urine: NEGATIVE
Glucose, UA: NEGATIVE
Hgb urine dipstick: NEGATIVE
Protein, ur: NEGATIVE
Urobilinogen, UA: 1

## 2011-03-16 LAB — LEVETIRACETAM LEVEL: Levetiracetam Lvl: 8 ug/mL

## 2011-03-16 LAB — RAPID STREP SCREEN (MED CTR MEBANE ONLY): Streptococcus, Group A Screen (Direct): NEGATIVE

## 2011-03-16 LAB — BASIC METABOLIC PANEL
BUN: 9
Chloride: 104
Creatinine, Ser: 0.7

## 2011-03-29 ENCOUNTER — Ambulatory Visit: Payer: Medicaid Other

## 2011-04-04 ENCOUNTER — Other Ambulatory Visit: Payer: Self-pay | Admitting: Pediatrics

## 2011-04-05 ENCOUNTER — Ambulatory Visit (INDEPENDENT_AMBULATORY_CARE_PROVIDER_SITE_OTHER): Payer: Medicaid Other | Admitting: Pediatrics

## 2011-04-05 DIAGNOSIS — Z23 Encounter for immunization: Secondary | ICD-10-CM

## 2011-05-10 DIAGNOSIS — G801 Spastic diplegic cerebral palsy: Secondary | ICD-10-CM | POA: Insufficient documentation

## 2011-05-10 DIAGNOSIS — G40409 Other generalized epilepsy and epileptic syndromes, not intractable, without status epilepticus: Secondary | ICD-10-CM | POA: Insufficient documentation

## 2011-05-10 DIAGNOSIS — G9349 Other encephalopathy: Secondary | ICD-10-CM | POA: Insufficient documentation

## 2011-05-14 ENCOUNTER — Encounter: Payer: Self-pay | Admitting: Pediatrics

## 2011-05-14 ENCOUNTER — Other Ambulatory Visit: Payer: Self-pay | Admitting: Pediatrics

## 2011-05-14 ENCOUNTER — Ambulatory Visit (INDEPENDENT_AMBULATORY_CARE_PROVIDER_SITE_OTHER): Payer: Medicaid Other | Admitting: Pediatrics

## 2011-05-14 DIAGNOSIS — Z22322 Carrier or suspected carrier of Methicillin resistant Staphylococcus aureus: Secondary | ICD-10-CM

## 2011-05-14 MED ORDER — MUPIROCIN 2 % EX OINT: TOPICAL_OINTMENT | Freq: Three times a day (TID) | CUTANEOUS | Status: AC

## 2011-05-14 MED ORDER — NEOMYCIN-POLYMYXIN-HC 3.5-10000-1 OT SUSP: 3.0000 [drp] | Freq: Three times a day (TID) | OTIC | Status: AC

## 2011-05-14 NOTE — Progress Notes (Signed)
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

## 2011-05-16 ENCOUNTER — Telehealth: Payer: Self-pay | Admitting: Pediatrics

## 2011-05-16 NOTE — Telephone Encounter (Signed)
Mom took urine to [facility] Last night. Zachary [name] had a bad seizure this morning and mom wonders if he has a UTI. Will you please call her back with the results

## 2011-05-17 NOTE — Telephone Encounter (Signed)
Left message, culture -

## 2011-05-22 ENCOUNTER — Telehealth: Payer: Self-pay | Admitting: Pediatrics

## 2011-05-22 NOTE — Telephone Encounter (Signed)
Mom called She wants to talk to you about the procedure he is having tomorrow. Because he had a siezure this morning,BP has been up this morning 140/108, and the ENT Dr. Prentiss Bells him on a antibotic for ear infection. So she wants to talk to you very important before procedure at [facility].

## 2011-05-22 NOTE — Telephone Encounter (Addendum)
Left message. Spoke with mother assured her anesthesia will triage, procedure relatively clean

## 2011-06-12 ENCOUNTER — Telehealth: Payer: Self-pay | Admitting: Pediatrics

## 2011-06-12 NOTE — Telephone Encounter (Signed)
Diastolic 110-117, on amlodipine 2.5 x 1-2 doses per day. Spoke with Dr Imogene Burn at [facility] increase to 5 1-2/day

## 2011-06-12 NOTE — Telephone Encounter (Signed)
Mom called and Zachary [name]'s Blood Pressure has been up. Bottom number running high. 150/117 then second time 148/109. She has been giving him his medication, but he does not want to walk. She wants to know what should she do? She is worried.

## 2011-06-25 ENCOUNTER — Encounter: Payer: Self-pay | Admitting: Pediatrics

## 2011-06-25 ENCOUNTER — Ambulatory Visit (INDEPENDENT_AMBULATORY_CARE_PROVIDER_SITE_OTHER): Payer: Medicaid Other | Admitting: Pediatrics

## 2011-06-25 ENCOUNTER — Ambulatory Visit: Payer: Medicaid Other | Admitting: Pediatrics

## 2011-06-25 VITALS — BP 102/80 | Ht 64.5 in | Wt 123.8 lb

## 2011-06-25 DIAGNOSIS — F840 Autistic disorder: Secondary | ICD-10-CM

## 2011-06-25 DIAGNOSIS — F79 Unspecified intellectual disabilities: Secondary | ICD-10-CM | POA: Insufficient documentation

## 2011-06-25 DIAGNOSIS — I10 Essential (primary) hypertension: Secondary | ICD-10-CM

## 2011-06-25 DIAGNOSIS — R569 Unspecified convulsions: Secondary | ICD-10-CM

## 2011-06-25 DIAGNOSIS — Z23 Encounter for immunization: Secondary | ICD-10-CM

## 2011-06-25 NOTE — Progress Notes (Signed)
VNS  X 1 month only 2 seizures, have adjusted voltage x 1, 0.75, moves to 1if seizure, BP under control with amlodipine 2.5 bid PE alert, chewing towel HEENT clear TMs and mouth CVS rr, no M Lungs clear Abd soft no HSM, male T5 Neuro intact DTRS, strength,tone variabl  ASS Autism,MR,seizure with new VNS Plan adjust VNS as needed, wean meds as tolerated, amlodipine bid 2.5, menactra

## 2011-07-17 ENCOUNTER — Other Ambulatory Visit: Payer: Self-pay | Admitting: Pediatrics

## 2011-07-17 ENCOUNTER — Telehealth: Payer: Self-pay | Admitting: Pediatrics

## 2011-07-17 NOTE — Telephone Encounter (Addendum)
BP spike on Friday increased amlodipine to control which it did . Now higher on prior dose stay on that dose. Left message, also discuss with renal at next visit Has not seen nephrologist, only discussed Try 5 am and only use in am  Unless spikes

## 2011-07-17 NOTE — Telephone Encounter (Signed)
Mom gave the medicine on Friday like you wanted and the blood pressure cam down. But now she back to normal dosage and the blood pressure is starting to go back up. What do you want to do? The nurse took it at school today at 9:00 140/93.

## 2011-07-23 ENCOUNTER — Other Ambulatory Visit: Payer: Self-pay | Admitting: Pediatrics

## 2011-07-23 MED ORDER — AMLODIPINE BESYLATE 2.5 MG PO TABS: ORAL_TABLET | ORAL | Status: DC

## 2011-07-23 NOTE — Progress Notes (Signed)
Having problems  With pm BP now on  Single  Dose try 2.5 in pm and 5 in am

## 2011-07-31 ENCOUNTER — Telehealth: Payer: Self-pay | Admitting: Pediatrics

## 2011-07-31 ENCOUNTER — Ambulatory Visit (INDEPENDENT_AMBULATORY_CARE_PROVIDER_SITE_OTHER): Payer: Medicaid Other | Admitting: Pediatrics

## 2011-07-31 DIAGNOSIS — Z22322 Carrier or suspected carrier of Methicillin resistant Staphylococcus aureus: Secondary | ICD-10-CM

## 2011-07-31 DIAGNOSIS — L0291 Cutaneous abscess, unspecified: Secondary | ICD-10-CM

## 2011-07-31 MED ORDER — SULFAMETHOXAZOLE-TRIMETHOPRIM 800-160 MG PO TABS: 1.0000 | ORAL_TABLET | Freq: Two times a day (BID) | ORAL | Status: AC

## 2011-07-31 NOTE — Telephone Encounter (Signed)
Zachary [name] has a boil on groin.Mother states it is very hard with an oblong shape.pls call

## 2011-07-31 NOTE — Progress Notes (Signed)
New onset boil in groin. Mother using topical/heat/and boil-ease. Has had some pus out still hard no fluctuance will start on TMP-smx 1tab ds bid Mother to call if fluctuance red or hot. Dr Ardyth Man also in to see. Hx of MRSA

## 2011-07-31 NOTE — Telephone Encounter (Signed)
Coming in

## 2011-08-03 ENCOUNTER — Telehealth: Payer: Self-pay | Admitting: Pediatrics

## 2011-08-03 NOTE — Telephone Encounter (Signed)
Spoke with Dr Juel Burrow ped nephro, may not be able to take due to age. No problem bid try 7.5-10  In am first

## 2011-08-10 ENCOUNTER — Telehealth: Payer: Self-pay | Admitting: Pediatrics

## 2011-08-10 NOTE — Telephone Encounter (Signed)
Mom noted that OSA with poor sleeping,? Seizure?status with BP up Spoke with Neurology Porfirio Mylar dohmeir to test osa, call to [facility] for adult nephrology

## 2011-08-10 NOTE — Telephone Encounter (Signed)
Merrill is still having spikes in blood pressure in the eve. & night.141/99,146/93,138/97,child is also falling and mother wants to talk to you about this problem

## 2011-08-13 ENCOUNTER — Telehealth: Payer: Self-pay | Admitting: Pediatrics

## 2011-08-13 NOTE — Telephone Encounter (Signed)
bp was elevated but resolved this afternoon. Still trying to get nephrology

## 2011-08-13 NOTE — Telephone Encounter (Signed)
Mom called to let you know that at 11:20 a m his BP was 130/102 and pulse was 82. He is having trouble standing up.

## 2011-08-14 ENCOUNTER — Other Ambulatory Visit: Payer: Self-pay | Admitting: Pediatrics

## 2011-08-14 DIAGNOSIS — G4733 Obstructive sleep apnea (adult) (pediatric): Secondary | ICD-10-CM

## 2011-08-14 DIAGNOSIS — I10 Essential (primary) hypertension: Secondary | ICD-10-CM

## 2011-08-23 ENCOUNTER — Telehealth: Payer: Self-pay | Admitting: Pediatrics

## 2011-08-23 NOTE — Telephone Encounter (Signed)
Left message , good neurologist, will allow into adult  Practice for long term f/u

## 2011-08-23 NOTE — Telephone Encounter (Signed)
Mom called and wants to transfer from Dr Sharene Skeans to Dr Sunday Shams. She wants your thoughts.

## 2011-08-24 ENCOUNTER — Telehealth: Payer: Self-pay | Admitting: Pediatrics

## 2011-08-24 NOTE — Telephone Encounter (Signed)
Mom called about referring Zachary [name] to Dr  Marylou Flesher. Transferring from Dr Sofie Rower to Dr Sheppard Plumber.

## 2011-08-24 NOTE — Telephone Encounter (Signed)
Had bad episode with Dr Sharene Skeans who later apologized, liked dr Richardean Chimera at sleep study and may need more adult approach in future want referral to Dr Richardean Chimera at [facility]

## 2011-08-27 ENCOUNTER — Other Ambulatory Visit: Payer: Self-pay | Admitting: Pediatrics

## 2011-08-27 MED ORDER — AMLODIPINE BESYLATE 5 MG PO TABS: 5.0000 mg | ORAL_TABLET | Freq: Two times a day (BID) | ORAL | Status: DC

## 2011-08-27 NOTE — Progress Notes (Signed)
Dose up to 5mg  bid stable bp for now. rx sent in

## 2011-08-27 NOTE — Telephone Encounter (Signed)
Message left at [facility] regarding referral

## 2011-08-27 NOTE — Telephone Encounter (Signed)
I spoke with [facility] today they stated in order for [place] to transfer to Dr. Richardean Chimera that Dr. Sharene Skeans vwill need to make that referral.  Grandmother given message and will have the mom call Dr. Gerald Leitz office

## 2011-10-01 ENCOUNTER — Other Ambulatory Visit: Payer: Self-pay | Admitting: *Deleted

## 2011-10-01 ENCOUNTER — Telehealth: Payer: Self-pay | Admitting: Pediatrics

## 2011-10-01 DIAGNOSIS — A4902 Methicillin resistant Staphylococcus aureus infection, unspecified site: Secondary | ICD-10-CM

## 2011-10-01 DIAGNOSIS — L709 Acne, unspecified: Secondary | ICD-10-CM

## 2011-10-01 NOTE — Telephone Encounter (Signed)
Mom called and want you to recommend a dermatologist. Zachary [name] face is broken out and he is scratching at it. Dr Lucretia Roers has retired and who do you recommend now.

## 2011-10-01 NOTE — Telephone Encounter (Signed)
Has appt with Dr Terri Piedra on Tuesday

## 2012-01-08 ENCOUNTER — Telehealth: Payer: Self-pay | Admitting: Pediatrics

## 2012-01-08 NOTE — Telephone Encounter (Signed)
See note. Left message try increase for 1 dose today for acute effect and may need long term ,but remember takes time

## 2012-01-08 NOTE — Telephone Encounter (Signed)
Mom called and his blood pressure is rising 125/101. Can not stand, very wobbly. Do you want her to increase blood pressure medication? amLodipine 5mg  1bid is what she is giving him right now.

## 2012-02-01 ENCOUNTER — Ambulatory Visit (INDEPENDENT_AMBULATORY_CARE_PROVIDER_SITE_OTHER): Payer: Medicaid Other | Admitting: Pediatrics

## 2012-02-01 ENCOUNTER — Telehealth: Payer: Self-pay

## 2012-02-01 DIAGNOSIS — F840 Autistic disorder: Secondary | ICD-10-CM

## 2012-02-01 DIAGNOSIS — G40319 Generalized idiopathic epilepsy and epileptic syndromes, intractable, without status epilepticus: Secondary | ICD-10-CM

## 2012-02-01 DIAGNOSIS — Z22322 Carrier or suspected carrier of Methicillin resistant Staphylococcus aureus: Secondary | ICD-10-CM

## 2012-02-01 DIAGNOSIS — H6690 Otitis media, unspecified, unspecified ear: Secondary | ICD-10-CM

## 2012-02-01 DIAGNOSIS — R6250 Unspecified lack of expected normal physiological development in childhood: Secondary | ICD-10-CM

## 2012-02-01 DIAGNOSIS — I10 Essential (primary) hypertension: Secondary | ICD-10-CM

## 2012-02-01 NOTE — Progress Notes (Signed)
Patient ID: Zachary [name], male   DOB: 1990-06-13, 21 y.o.   MRN: [id number]  Autism, cerebral palsy, MR, developmental delays Seizures began at 25-80 years old, [place] Implanted VNS stimulator at [facility] (improved intensity of seizures if not frequency) Tubes in his ears, current drainage from L ear, has also had Skin: folliculitis barbae (all over), MRSA+ Pin in L shoulder, L hip to fix past fractures from falls L sided weakness Graduated from [employer] this year, now in [name] Non-verbal, can walk Difficulty urinating, will retain urine and may need I/O catherterization Chronic constipation may contribute, Managed by working on constipation, as needed I/O catheterization  HTN episodes where BP will spike (primarily diastolic), increase Amplodipine for few days to account Symptomatic with dizziness, increased fall risk  Lot of self-stimulating activity Head banging, bangs ears MRSA issue  Therapies:  None formally, After [employer], works on range of motion [employer], [place]  Medications: Keppra 1000 mg, 1.5 tabs daily Vimpat 100 mg, 1 tab twice per day Vimpat 200 mg, 2 tabs at night Trileptal 600 mg, 1.5 tabs bid Amlodipine 5 mg, 1 tab bid Miralax 1 cap, up to 4 times per day if needed Diastat, as needed Supplemental O2 in case of seizure  Typical Seizure: Now, 30-45 seconds of staring Were much worse in the past  Most calls at this point are about BP and urinary issues Has not had MRSA for "a while" Has had MRSA in ear Linked to hospital stays when he fell and had to have pins placed  Nutrition: Eats well, chopped texture. Drinks Ensure  Specialist: Therapist, music), consider Warehouse manager (Adult Neuro) ENT ([place], [facility]) Urologist Manon Hilding), has an upcoming appointment Orthopedic Eul[facility] Pont) Dentist [facility]) Dermatology ([place]), recently treated for 30 days with Ciprofloxacin for acne  Sleep: Stops breathing at night, but his  O2 sats do not fall enough to qualify for CPAP May need letter to insurance company about CPAP Will try placing tennis balls underneath him to keep him from laying on his back Mother will also try Breathe Right strips Consider dental appliances to thrust jaw forward Has soft/floppy palate Mother wakes 4-5 times per night to check and reposition him  Right knee will intermittently dislocate Is beginning to walk more on lateral foot  Left ear: currently with copious pus blocking canal Ciprodex suspension has been effective in the past  A: 21 year old AAM with Autism, cerebral palsy, MR, developmental delays, seizures, history of recurrent MRSA folliculitis.  Doing well on current anti=epileptic regimen, with VNS in place.  P:  1. Reviewed chart and history with mother.  See above for details. 2. Will follow along as needed.  Total time = 40 minutes, >50% counseling

## 2012-02-01 NOTE — Telephone Encounter (Signed)
Needs RX for Ciprodex otic drops

## 2012-02-02 MED ORDER — CIPROFLOXACIN-DEXAMETHASONE 0.3-0.1 % OT SUSP: OTIC | Status: AC

## 2012-02-02 NOTE — Telephone Encounter (Signed)
Mom called, does not have any ciprodex otic drops left.

## 2012-02-23 ENCOUNTER — Emergency Department (HOSPITAL_COMMUNITY)
Admission: EM | Admit: 2012-02-23 | Discharge: 2012-02-24 | Disposition: A | Discharge status: Home / Self Care | Payer: Medicaid Other | Attending: Emergency Medicine | Admitting: Emergency Medicine

## 2012-02-23 ENCOUNTER — Other Ambulatory Visit: Payer: Self-pay

## 2012-02-23 DIAGNOSIS — R569 Unspecified convulsions: Secondary | ICD-10-CM | POA: Insufficient documentation

## 2012-02-23 DIAGNOSIS — R404 Transient alteration of awareness: Secondary | ICD-10-CM | POA: Insufficient documentation

## 2012-02-23 NOTE — ED Notes (Signed)
Pt alert, arrives from home, c/o Seizure, onset 2145, lasting several minutes, pt resp even unlabored, skin pwd, CBG 80

## 2012-02-23 NOTE — ED Notes (Signed)
As per EMS pt was found on sofa postictal . Pt family sts pt went limp. Pt is MR and nonverb.VSS.pt is moist.unknown of pt baesline

## 2012-02-23 NOTE — ED Provider Notes (Signed)
History     CSN: [id number]  Arrival date & time 02/23/12  2259   First MD Initiated Contact with Patient 02/23/12 2325      Chief Complaint  Patient presents with  . Seizures  . Loss of Consciousness    (Consider location/radiation/quality/duration/timing/severity/associated sxs/prior treatment) HPI HX per mother, has Sz history, MR and Cerebral Palsy. At home, had a period of "going limp" with eyes rolled in the back of his head. EMS was called b/c his typical seizures last 30-45 seconds and 1-2 per mother.  This week he has had 3 seizures and then again tonight. No F/C. No N/V/D. NEU is Dr Sharene Skeans who regulates his Keppra, Trileptal, Vimpat and a stimulator. Mother concerned about prolonged ictal state. No abnormal behavior or recent illness otherwise, no sleep deprivation. Mod in severity. Level 5 caveat applies - PT unable to provide any history.    No past medical history on file.  No past surgical history on file.  No family history on file.  History  Substance Use Topics  . Smoking status: Never Smoker   . Smokeless tobacco: Never Used  . Alcohol Use: No      Review of Systems  Unable to perform ROS level 5 caveat as above  Allergies  Penicillins  Home Medications   Current Outpatient Rx  Name Route Sig Dispense Refill  . AMLODIPINE BESYLATE 2.5 MG PO TABS  2 tabs in AM and 1 tab PM 90 tablet 2  . AMLODIPINE BESYLATE 5 MG PO TABS Oral Take 1 tablet (5 mg total) by mouth 2 (two) times daily. 60 tablet 5  . LEVETIRACETAM 1000 MG PO TABS Oral Take 1,000 mg by mouth 2 (two) times daily.      Marland Kitchen LISINOPRIL 10 MG PO TABS Oral Take 10 mg by mouth daily.      Marland Kitchen ONDANSETRON 4 MG PO TBDP  1tablet q2h prn vomiting up to 6 per day 18 tablet 2  . OXCARBAZEPINE 150 MG PO TABS Oral Take 150 mg by mouth 2 (two) times daily.        BP 119/73  Pulse 72  Temp 98.5 F (36.9 C) (Oral)  Resp 15  SpO2 97%  Physical Exam  Constitutional: He appears well-developed and  well-nourished.  HENT:  Head: Normocephalic and atraumatic.  Right Ear: External ear normal.  Left Ear: External ear normal.  Mouth/Throat: Oropharynx is clear and moist.  Eyes: Pupils are equal, round, and reactive to light. No scleral icterus.  Neck: Neck supple.  Cardiovascular: Regular rhythm and intact distal pulses.   Pulmonary/Chest: Effort normal. No respiratory distress.  Musculoskeletal: Normal range of motion. He exhibits no edema.  Neurological:       Awake, alert, baseline is non verbal and baseline does not follow commands.   Skin: Skin is warm and dry.       Facial Acne but no sig lesions otherwise    ED Course  Procedures (including critical care time)  Results for orders placed during the hospital encounter of 02/23/12  CBC      Component Value Range   WBC 4.7  4.0 - 10.5 K/uL   RBC 4.89  4.22 - 5.81 MIL/uL   Hemoglobin 13.8  13.0 - 17.0 g/dL   HCT 40.9  81.1 - 91.4 %   MCV 81.4  78.0 - 100.0 fL   MCH 28.2  26.0 - 34.0 pg   MCHC 34.7  30.0 - 36.0 g/dL   RDW 12.4  11.5 - 15.5 %   Platelets 192  150 - 400 K/uL  BASIC METABOLIC PANEL      Component Value Range   Sodium 136  135 - 145 mEq/L   Potassium 3.6  3.5 - 5.1 mEq/L   Chloride 101  96 - 112 mEq/L   CO2 26  19 - 32 mEq/L   Glucose, Bld 106 (*) 70 - 99 mg/dL   BUN 9  6 - 23 mg/dL   Creatinine, Ser 4.78  0.50 - 1.35 mg/dL   Calcium 8.9  8.4 - 29.5 mg/dL   GFR calc non Af Amer >90  >90 mL/min   GFR calc Af Amer >90  >90 mL/min  URINALYSIS, ROUTINE W REFLEX MICROSCOPIC      Component Value Range   Color, Urine YELLOW  YELLOW   APPearance CLOUDY (*) CLEAR   Specific Gravity, Urine 1.025  1.005 - 1.030   pH 7.5  5.0 - 8.0   Glucose, UA NEGATIVE  NEGATIVE mg/dL   Hgb urine dipstick NEGATIVE  NEGATIVE   Bilirubin Urine NEGATIVE  NEGATIVE   Ketones, ur NEGATIVE  NEGATIVE mg/dL   Protein, ur NEGATIVE  NEGATIVE mg/dL   Urobilinogen, UA 1.0  0.0 - 1.0 mg/dL   Nitrite NEGATIVE  NEGATIVE   Leukocytes, UA  NEGATIVE  NEGATIVE  GLUCOSE, CAPILLARY      Component Value Range   Glucose-Capillary 104 (*) 70 - 99 mg/dL   Comment 1 Notify RN     1:39 AM recheck - back to baseline. PTs mother requesting to be discharged home and plans to f/u Dr Sharene Skeans on Monday morning for recheck.    Labs reviewed as above. No UTI. VS WNL. Plan d/c home - mother is reliable and do not feel PT requires further work up or admit at this time.   MDM   VS and nursing notes reviewed. Labs as above. UA reviewed. Serial evaluations - improved condition.        Sunnie Nielsen, MD 02/24/12 [phone]

## 2012-02-24 LAB — BASIC METABOLIC PANEL
CO2: 26 mEq/L (ref 19–32)
Chloride: 101 mEq/L (ref 96–112)
Glucose, Bld: 106 mg/dL — ABNORMAL HIGH (ref 70–99)
Potassium: 3.6 mEq/L (ref 3.5–5.1)
Sodium: 136 mEq/L (ref 135–145)

## 2012-02-24 LAB — URINALYSIS, ROUTINE W REFLEX MICROSCOPIC
Glucose, UA: NEGATIVE mg/dL
Hgb urine dipstick: NEGATIVE
Specific Gravity, Urine: 1.025 (ref 1.005–1.030)
Urobilinogen, UA: 1 mg/dL (ref 0.0–1.0)
pH: 7.5 (ref 5.0–8.0)

## 2012-02-24 LAB — GLUCOSE, CAPILLARY: Glucose-Capillary: 104 mg/dL — ABNORMAL HIGH (ref 70–99)

## 2012-02-24 LAB — CBC
Hemoglobin: 13.8 g/dL (ref 13.0–17.0)
RBC: 4.89 MIL/uL (ref 4.22–5.81)

## 2012-05-23 ENCOUNTER — Other Ambulatory Visit: Payer: Self-pay | Admitting: Pediatrics

## 2012-05-23 MED ORDER — AMLODIPINE BESYLATE 5 MG PO TABS: 5.0000 mg | ORAL_TABLET | Freq: Two times a day (BID) | ORAL | Status: DC

## 2012-06-28 ENCOUNTER — Telehealth: Payer: Self-pay | Admitting: Pediatrics

## 2012-06-28 NOTE — Telephone Encounter (Signed)
Zachary [name] had congestion and mom wants to know with his seizure meds what she can give him

## 2012-07-04 ENCOUNTER — Other Ambulatory Visit: Payer: Self-pay | Admitting: Pediatrics

## 2012-07-04 MED ORDER — FLUTICASONE PROPIONATE 50 MCG/ACT NA SUSP: 1.0000 | Freq: Every day | NASAL | Status: DC

## 2012-08-25 ENCOUNTER — Other Ambulatory Visit: Payer: Self-pay | Admitting: Pediatrics

## 2012-08-25 MED ORDER — AMLODIPINE BESYLATE 5 MG PO TABS: 5.0000 mg | ORAL_TABLET | Freq: Two times a day (BID) | ORAL | Status: DC

## 2012-08-26 ENCOUNTER — Telehealth: Payer: Self-pay | Admitting: *Deleted

## 2012-08-26 ENCOUNTER — Ambulatory Visit (INDEPENDENT_AMBULATORY_CARE_PROVIDER_SITE_OTHER): Payer: Medicaid Other | Admitting: Pediatrics

## 2012-08-26 VITALS — Wt 124.0 lb

## 2012-08-26 DIAGNOSIS — R569 Unspecified convulsions: Secondary | ICD-10-CM

## 2012-08-26 DIAGNOSIS — R1110 Vomiting, unspecified: Secondary | ICD-10-CM

## 2012-08-26 MED ORDER — ONDANSETRON 4 MG PO TBDP: ORAL_TABLET | ORAL | Status: DC

## 2012-08-26 NOTE — Telephone Encounter (Signed)
I called and spoke with Mom, Zachary [name]. She said that Zachary [name] developed vomiting last night around 6:30 PM and vomiting on and off all night. He saw pediatrician today who dx viral syndrome. He was given Zofran, which has helped. Mom gave him his 7AM meds at 11AM and he kept those down. He is now keeping down Pedialyte. She asked about how to get in the rest of the day's medications. I talked with Mom about giving the remainder of the doses a few hours later than usual, assuming that Zachary [name] does not vomit any more today. I told her that he may be sleepier than usual but that he can sleep tonight and hopefully be back to normal tomorrow. I told her that if his vomiting returns that he may need to go to ER for IV fluids and that they can give Levetiracetam and Vimpat IV if needed.I asked her to call me back if she has questions or concerns. Mom agreed with these plans.

## 2012-08-26 NOTE — Telephone Encounter (Signed)
[name] the patient's mom called and stated that the patient has a very bad virus and throwing up since 6:30 pm yesterday, she tried giving him 1/2 tab of 1000 mg Keppra and 1/2 tab of 600 mg Trileptal around 7:00 am and 10 minutes after she gave it to him he threw it up. He had an appt. With his pcp this morning and they prescribed Zofran mom was unsure of mg right off, she gave him one dissolvable Zofran 10:30 am this morning and so far he has kept that down. She is going to try to give him his regular doses of his Keppra 1000 mg 1 1/2,  Trileptal 600 mg 1 1/2 at 11:30 am and Vimpat 100 mg at 3:00 pm and see if he can keep it down. Archie Patten wants to know what Dr. Darl Householder advice is in case Tiger is unable to keep his medications down. Mom can be reached at [phone], she also has stated that [name] had 2 seizures this morning each lasting about 1 minute. Thanks, Belenda Cruise.

## 2012-08-26 NOTE — Progress Notes (Signed)
Subjective:    Patient ID: Zachary [name], male   DOB: 15-Jul-1990, 22 y.o.   MRN: [id number]  HPI: 22 year old CP, DD wheelchair bound, nonverbal young man here with mom b/o Projjectile vomiting since yesterday. Not bilious. Several times.  Gagging, couldn't keep Sz meds down -- did keep 9 pm meds down but threw up again this morning after attempting meds again. No fever. No diarrhea. No other Sx of acute illness. Intractable seizures on multiple meds and an implanted vagus nerve stimulator. Had two seizures this AM that responded to VNS. Seizure threshold significantly lower with any acute illness. Has wet a diaper this AM but not saturated.  Pertinent PMHx: seizure disorder, MR, hypertension, constipation -- see problem list  which is reviewed. Constipation is chronic but doesn't seem related to this acute problem. Hard BM a day ago. Has Miralax prn, but hard to titrate right amount -- too much and he gets diarrhea and it also causes abd pain. Meds: med list reviewed and updated Drug Allergies: penicillins Immunizations: UTD, did not get flu vaccine this year Fam Hx: no one sick at home  ROS: Negative except for specified in HPI and PMHx  Objective:  Weight 124 lb (56.246 kg). GEN: Sitting up in wheechair in usual state of alertness. Does not appear uncomfortable. HEENT:     Head: normocephalic    TMs: tube seen on left, sm amt wax on right -- TM normal but cannot see tube    Nose: clear   Throat: no erythema, exudate or vesicles, moist mm    Eyes:  no periorbital swelling, no conjunctival injection or discharge NECK: supple, no masses NODES: neg CHEST: symmetrical, palpable device under skin of left upper chest LUNGS: clear to aus, BS equal  COR: No murmur, RRR, pulse 72 ABD: soft, nontender, nondistended, no HSM SKIN: well perfused    No results found. No results found for this or any previous visit (from the past 240 hour(s)). @RESULTS @ Assessment:   Viral gastritis Seizure  disorder Plan:  Reviewed findings. Gave ORS and instructions Rx for ondansetron to use once or twice If vomiting persists, recheck Discussed constipation -- can try fiber gummies, metamucil instead of miralax

## 2012-08-26 NOTE — Patient Instructions (Addendum)
Viral Gastroenteritis Viral gastroenteritis is also known as stomach flu. This condition affects the stomach and intestinal tract. It can cause sudden diarrhea and vomiting. The illness typically lasts 3 to 8 days. Most people develop an immune response that eventually gets rid of the virus. While this natural response develops, the virus can make you quite ill. CAUSES  Many different viruses can cause gastroenteritis, such as rotavirus or noroviruses. You can catch one of these viruses by consuming contaminated food or water. You may also catch a virus by sharing utensils or other personal items with an infected person or by touching a contaminated surface. SYMPTOMS  The most common symptoms are diarrhea and vomiting. These problems can cause a severe loss of body fluids (dehydration) and a body salt (electrolyte) imbalance. Other symptoms may include:  Fever.  Headache.  Fatigue.  Abdominal pain. DIAGNOSIS  Your caregiver can usually diagnose viral gastroenteritis based on your symptoms and a physical exam. A stool sample may also be taken to test for the presence of viruses or other infections. TREATMENT  This illness typically goes away on its own. Treatments are aimed at rehydration. The most serious cases of viral gastroenteritis involve vomiting so severely that you are not able to keep fluids down. In these cases, fluids must be given through an intravenous line (IV). HOME CARE INSTRUCTIONS   Drink enough fluids to keep your urine clear or pale yellow. Drink small amounts of fluids frequently and increase the amounts as tolerated.  Ask your caregiver for specific rehydration instructions.  Avoid:  Foods high in sugar.  Alcohol.  Carbonated drinks.  Tobacco.  Juice.  Caffeine drinks.  Extremely hot or cold fluids.  Fatty, greasy foods.  Too much intake of anything at one time.  Dairy products until 24 to 48 hours after diarrhea stops.  You may consume probiotics.  Probiotics are active cultures of beneficial bacteria. They may lessen the amount and number of diarrheal stools in adults. Probiotics can be found in yogurt with active cultures and in supplements.  Wash your hands well to avoid spreading the virus.  Only take over-the-counter or prescription medicines for pain, discomfort, or fever as directed by your caregiver. Do not give aspirin to children. Antidiarrheal medicines are not recommended.  Ask your caregiver if you should continue to take your regular prescribed and over-the-counter medicines.  Keep all follow-up appointments as directed by your caregiver. SEEK IMMEDIATE MEDICAL CARE IF:   You are unable to keep fluids down.  You do not urinate at least once every 6 to 8 hours.  You develop shortness of breath.  You notice blood in your stool or vomit. This may look like coffee grounds.  You have abdominal pain that increases or is concentrated in one small area (localized).  You have persistent vomiting or diarrhea.  You have a fever.  The patient is a child younger than 3 months, and he or she has a fever.  The patient is a child older than 3 months, and he or she has a fever and persistent symptoms.  The patient is a child older than 3 months, and he or she has a fever and symptoms suddenly get worse.  The patient is a baby, and he or she has no tears when crying. MAKE SURE YOU:   Understand these instructions.  Will watch your condition.  Will get help right away if you are not doing well or get worse. Document Released: 05/21/2005 Document Revised: 08/13/2011 Document Reviewed: 03/07/2011   [employer] Patient Information 2013 [employer].  

## 2012-08-26 NOTE — Telephone Encounter (Signed)
I reviewed your note and agree with your assessment and recommendations to mother. 

## 2012-08-28 ENCOUNTER — Emergency Department (HOSPITAL_COMMUNITY): Payer: Medicaid Other

## 2012-08-28 ENCOUNTER — Encounter (HOSPITAL_COMMUNITY): Payer: Self-pay | Admitting: *Deleted

## 2012-08-28 ENCOUNTER — Emergency Department (HOSPITAL_COMMUNITY)
Admission: EM | Admit: 2012-08-28 | Discharge: 2012-08-28 | Disposition: A | Discharge status: Home / Self Care | Payer: Medicaid Other | Attending: Emergency Medicine | Admitting: Emergency Medicine

## 2012-08-28 DIAGNOSIS — R112 Nausea with vomiting, unspecified: Secondary | ICD-10-CM | POA: Insufficient documentation

## 2012-08-28 DIAGNOSIS — IMO0002 Reserved for concepts with insufficient information to code with codable children: Secondary | ICD-10-CM | POA: Insufficient documentation

## 2012-08-28 DIAGNOSIS — G40909 Epilepsy, unspecified, not intractable, without status epilepticus: Secondary | ICD-10-CM | POA: Insufficient documentation

## 2012-08-28 DIAGNOSIS — R7989 Other specified abnormal findings of blood chemistry: Secondary | ICD-10-CM | POA: Insufficient documentation

## 2012-08-28 DIAGNOSIS — R109 Unspecified abdominal pain: Secondary | ICD-10-CM | POA: Insufficient documentation

## 2012-08-28 DIAGNOSIS — R569 Unspecified convulsions: Secondary | ICD-10-CM

## 2012-08-28 DIAGNOSIS — Z79899 Other long term (current) drug therapy: Secondary | ICD-10-CM | POA: Insufficient documentation

## 2012-08-28 DIAGNOSIS — I10 Essential (primary) hypertension: Secondary | ICD-10-CM | POA: Insufficient documentation

## 2012-08-28 DIAGNOSIS — Z8659 Personal history of other mental and behavioral disorders: Secondary | ICD-10-CM | POA: Insufficient documentation

## 2012-08-28 DIAGNOSIS — R1110 Vomiting, unspecified: Secondary | ICD-10-CM

## 2012-08-28 DIAGNOSIS — Z8669 Personal history of other diseases of the nervous system and sense organs: Secondary | ICD-10-CM | POA: Insufficient documentation

## 2012-08-28 HISTORY — DX: Unspecified intellectual disabilities: F79

## 2012-08-28 HISTORY — DX: Unspecified convulsions: R56.9

## 2012-08-28 HISTORY — DX: Essential (primary) hypertension: I10

## 2012-08-28 HISTORY — DX: Cerebral palsy, unspecified: G80.9

## 2012-08-28 LAB — CBC WITH DIFFERENTIAL/PLATELET
Basophils Absolute: 0 10*3/uL (ref 0.0–0.1)
Basophils Relative: 0 % (ref 0–1)
Hemoglobin: 14.6 g/dL (ref 13.0–17.0)
MCHC: 36 g/dL (ref 30.0–36.0)
Monocytes Relative: 8 % (ref 3–12)
Neutro Abs: 4.3 10*3/uL (ref 1.7–7.7)
Neutrophils Relative %: 80 % — ABNORMAL HIGH (ref 43–77)
RBC: 5.01 MIL/uL (ref 4.22–5.81)

## 2012-08-28 LAB — COMPREHENSIVE METABOLIC PANEL
ALT: 85 U/L — ABNORMAL HIGH (ref 0–53)
AST: 57 U/L — ABNORMAL HIGH (ref 0–37)
Albumin: 3.7 g/dL (ref 3.5–5.2)
Alkaline Phosphatase: 50 U/L (ref 39–117)
BUN: 10 mg/dL (ref 6–23)
Chloride: 99 mEq/L (ref 96–112)
Potassium: 3.8 mEq/L (ref 3.5–5.1)
Total Bilirubin: 0.3 mg/dL (ref 0.3–1.2)

## 2012-08-28 LAB — URINALYSIS, ROUTINE W REFLEX MICROSCOPIC
Glucose, UA: NEGATIVE mg/dL
Hgb urine dipstick: NEGATIVE
Ketones, ur: >80 mg/dL — AB
Protein, ur: NEGATIVE mg/dL

## 2012-08-28 MED ORDER — ONDANSETRON 8 MG PO TBDP
8.0000 mg | ORAL_TABLET | Freq: Once | ORAL | Status: AC
  Administered 2012-08-28: 8 mg via ORAL
  Filled 2012-08-28: qty 1

## 2012-08-28 MED ORDER — IOHEXOL 300 MG/ML  SOLN
50.0000 mL | Freq: Once | INTRAMUSCULAR | Status: AC | PRN
  Administered 2012-08-28: 50 mL via ORAL

## 2012-08-28 MED ORDER — IOHEXOL 300 MG/ML  SOLN
100.0000 mL | Freq: Once | INTRAMUSCULAR | Status: AC | PRN
  Administered 2012-08-28: 100 mL via INTRAVENOUS

## 2012-08-28 MED ORDER — ONDANSETRON 8 MG PO TBDP: 8.0000 mg | ORAL_TABLET | Freq: Three times a day (TID) | ORAL | Status: DC | PRN

## 2012-08-28 MED ORDER — ACETAMINOPHEN 325 MG PO TABS
650.0000 mg | ORAL_TABLET | Freq: Once | ORAL | Status: AC
  Administered 2012-08-28: 650 mg via ORAL
  Filled 2012-08-28: qty 2

## 2012-08-28 NOTE — ED Notes (Signed)
Patient transported to CT 

## 2012-08-28 NOTE — ED Provider Notes (Signed)
History     CSN: [id number]  Arrival date & time 08/28/12  0350   None     Chief Complaint  Patient presents with  . Emesis  . Seizures    (Consider location/radiation/quality/duration/timing/severity/associated sxs/prior treatment) HPI History provided by patient's mother.  Level 5 caveat applies because patient is non-verbal.  Pt has cerebral palsy and seizure disorder, for which is he compliant w/ trileptal and keppra.  Developed N/V 3 days ago.  PCP prescribed zofran and vomiting resolved, until this morning when he began dry heaving in his sleep at 1:30.  Subsequently developed a generalized tonic-clonic seizure.  Had had multiple other seizures this week but frequency and characteristics are typical.  Associated w/ fever, max temp 101, constipation, last BM 5 days ago, weakness (unable to stand up), ALOC and decreased urinary output.  He is unable to communicate pain. Has not had cough or SOB.  No known sick contacts.  Has had UTI in the past.  Past Medical History  Diagnosis Date  . Seizures   . MR (mental retardation)   . CP (cerebral palsy)   . Hypertension     Past Surgical History  Procedure Laterality Date  . Hip surgery    . Foot surgery    . Implantation vagal nerve stimulator      No family history on file.  History  Substance Use Topics  . Smoking status: Never Smoker   . Smokeless tobacco: Never Used  . Alcohol Use: No      Review of Systems  All other systems reviewed and are negative.    Allergies  Depakote; Lyrica; Topamax; and Penicillins  Home Medications   Current Outpatient Rx  Name  Route  Sig  Dispense  Refill  . amLODipine (NORVASC) 5 MG tablet   Oral   Take 1 tablet (5 mg total) by mouth 2 (two) times daily.   60 tablet   3   . fluticasone (FLONASE) 50 MCG/ACT nasal spray   Nasal   Place 1 spray into the nose daily.   16 g   2   . lacosamide (VIMPAT) 200 MG TABS   Oral   Take 100 mg by mouth 2 (two) times daily.          Marland Kitchen levETIRAcetam (KEPPRA) 1000 MG tablet   Oral   Take 1,000 mg by mouth 2 (two) times daily.           . ondansetron (ZOFRAN-ODT) 4 MG disintegrating tablet      1tablet q2h prn vomiting up to 6 per day   3 tablet   1   . OXcarbazepine (TRILEPTAL) 150 MG tablet   Oral   Take 150 mg by mouth 2 (two) times daily.             BP 124/85  Pulse 80  Temp(Src) 97.4 F (36.3 C) (Axillary)  Resp 16  SpO2 98%  Physical Exam  Nursing note and vitals reviewed. Constitutional: He appears well-developed and well-nourished. No distress.  HENT:  Head: Normocephalic and atraumatic.  Mouth/Throat: Oropharynx is clear and moist.  Eyes:  Normal appearance  Neck: Normal range of motion.  Cardiovascular: Normal rate and regular rhythm.   Pulmonary/Chest: Effort normal and breath sounds normal. No respiratory distress.  Abdominal: Soft. Bowel sounds are normal. He exhibits no distension.  Pt grunting w/ palpation of abdomen.  Tenderness seems to be diffuse.  Skin graft across lower abdomen.    Musculoskeletal: Normal range of motion.  Neurological: He is alert.  Drowsy but arousible.  Pupils non-reactive.  Pt does not follow commands.   Skin: Skin is warm and dry. No rash noted.  Psychiatric: He has a normal mood and affect. His behavior is normal.    ED Course  Procedures (including critical care time)  Labs Reviewed  CBC WITH DIFFERENTIAL - Abnormal; Notable for the following:    Platelets 129 (*)    Neutrophils Relative 80 (*)    All other components within normal limits  COMPREHENSIVE METABOLIC PANEL - Abnormal; Notable for the following:    Glucose, Bld 100 (*)    AST 57 (*)    ALT 85 (*)    All other components within normal limits  URINALYSIS, ROUTINE W REFLEX MICROSCOPIC - Abnormal; Notable for the following:    Specific Gravity, Urine 1.036 (*)    Bilirubin Urine SMALL (*)    Ketones, ur >80 (*)    All other components within normal limits   No results  found.   No diagnosis found.    MDM  22yo non-verbal M w/ cerebral palsy and seizure disorder, on keppra and trileptal, presents w/ N/V, seizure and ALOC.  His mother is concerned that he is in pain and is dehydrated.  On exam, afebrile, drowsy but arousible, abd soft but pt grunts w/ palpation, does not follow commands, pupils equal/round but non-reactive.  Labs pending but suspect UTI.  Prior h/o same.  Pt has received tylenol and zofran.  5:33 AM   U/A negative.  Results discussed w/ pt.  Dr. [name] Gunning has examined abd and rectum (pt is not impacted) and he recommends CT abd/pelvis.  Kirichenko, PA-C to resume care.         Otilio Miu, PA-C 08/28/12 [phone]

## 2012-08-28 NOTE — ED Notes (Signed)
YQI:[id number]<QV> Expected date:08/28/12<BR> Expected time: 3:38 AM<BR> Means of arrival:Ambulance<BR> Comments:<BR> seizure

## 2012-08-28 NOTE — ED Notes (Signed)
Pt to ED via EMS for complaints of Nausea / Vomiting and seizure tonight; EMS reports that pt was seen by PCP on Tues for N/V and was prescribed Zofran;  Mom reports that Zofran is not helping and pt has vomited in his sleep tonight; mother reports that pt has a history of aspiration with vomiting; Mother concerned over continued vomiting despite Zofran and that he vomited in his sleep.

## 2012-08-28 NOTE — ED Provider Notes (Signed)
Zachary [name] is a 22 y.o. male who presented to ED with complaint ov vomiting, seizure last night. Pt with hx of MR, lives with his mother. Pt signed out to me at shift change. Pt pending abd CT.   Results for orders placed during the hospital encounter of 08/28/12  CBC WITH DIFFERENTIAL      Result Value Range   WBC 5.4  4.0 - 10.5 K/uL   RBC 5.01  4.22 - 5.81 MIL/uL   Hemoglobin 14.6  13.0 - 17.0 g/dL   HCT 78.2  95.6 - 21.3 %   MCV 81.0  78.0 - 100.0 fL   MCH 29.1  26.0 - 34.0 pg   MCHC 36.0  30.0 - 36.0 g/dL   RDW 08.6  57.8 - 46.9 %   Platelets 129 (*) 150 - 400 K/uL   Neutrophils Relative 80 (*) 43 - 77 %   Neutro Abs 4.3  1.7 - 7.7 K/uL   Lymphocytes Relative 12  12 - 46 %   Lymphs Abs 0.7  0.7 - 4.0 K/uL   Monocytes Relative 8  3 - 12 %   Monocytes Absolute 0.4  0.1 - 1.0 K/uL   Eosinophils Relative 0  0 - 5 %   Eosinophils Absolute 0.0  0.0 - 0.7 K/uL   Basophils Relative 0  0 - 1 %   Basophils Absolute 0.0  0.0 - 0.1 K/uL  COMPREHENSIVE METABOLIC PANEL      Result Value Range   Sodium 135  135 - 145 mEq/L   Potassium 3.8  3.5 - 5.1 mEq/L   Chloride 99  96 - 112 mEq/L   CO2 27  19 - 32 mEq/L   Glucose, Bld 100 (*) 70 - 99 mg/dL   BUN 10  6 - 23 mg/dL   Creatinine, Ser 6.29  0.50 - 1.35 mg/dL   Calcium 8.7  8.4 - 52.8 mg/dL   Total Protein 6.9  6.0 - 8.3 g/dL   Albumin 3.7  3.5 - 5.2 g/dL   AST 57 (*) 0 - 37 U/L   ALT 85 (*) 0 - 53 U/L   Alkaline Phosphatase 50  39 - 117 U/L   Total Bilirubin 0.3  0.3 - 1.2 mg/dL   GFR calc non Af Amer >90  >90 mL/min   GFR calc Af Amer >90  >90 mL/min  URINALYSIS, ROUTINE W REFLEX MICROSCOPIC      Result Value Range   Color, Urine YELLOW  YELLOW   APPearance CLEAR  CLEAR   Specific Gravity, Urine 1.036 (*) 1.005 - 1.030   pH 6.0  5.0 - 8.0   Glucose, UA NEGATIVE  NEGATIVE mg/dL   Hgb urine dipstick NEGATIVE  NEGATIVE   Bilirubin Urine SMALL (*) NEGATIVE   Ketones, ur >80 (*) NEGATIVE mg/dL   Protein, ur NEGATIVE  NEGATIVE  mg/dL   Urobilinogen, UA 1.0  0.0 - 1.0 mg/dL   Nitrite NEGATIVE  NEGATIVE   Leukocytes, UA NEGATIVE  NEGATIVE  LIPASE, BLOOD      Result Value Range   Lipase 22  11 - 59 U/L   Ct Abdomen Pelvis W Contrast  08/28/2012  *RADIOLOGY REPORT*  Clinical Data: Seizures.  Emesis.  CT ABDOMEN AND PELVIS WITH CONTRAST  Technique:  Multidetector CT imaging of the abdomen and pelvis was performed following the standard protocol during bolus administration of intravenous contrast.  Contrast: OMNIPAQUE IOHEXOL 300 MG/ML  SOLN  Comparison: No priors.  Findings:  Lung Bases: Unremarkable.  Abdomen/Pelvis:  A subcentimeter low attenuation lesion in segment six of the liver is too small to definitively characterize, but statistically likely represents a cyst in this young patient.  The remainder of the liver is otherwise unremarkable in appearance. The appearance of the gallbladder, pancreas, spleen, bilateral adrenal glands and bilateral kidneys is unremarkable.  A there is no significant volume of ascites, no pneumoperitoneum and no pathologic distension of small bowel.  No definite pathologic lymphadenopathy identified within the abdomen or pelvis. The prostate and urinary bladder are unremarkable in appearance. Normal appendix.  Musculoskeletal: Postoperative changes of ORIF for noted in the left femoral neck. There are no aggressive appearing lytic or blastic lesions noted in the visualized portions of the skeleton.  IMPRESSION: 1.  No acute findings in the abdomen or pelvis to account for the patient's symptoms. 2.  Specifically, the appendix is normal.   Original Report Authenticated By: Trudie Reed, M.D.    9:31 AM Labs unremarkable. Suspect dehydration (>80ketones in urine), pt is able to drink PO fluids here in ED, tolerating well. LFTs mildly elevated, discussed with mother. CT negative. Pt not vomiting at this time. Abdomen soft. Pt stable for d/c home. Follow up as needed.   Filed Vitals:    08/28/12 0357 08/28/12 0930  BP: 124/85 116/87  Pulse: 80 63  Temp: 97.4 F (36.3 C)   TempSrc: Axillary   Resp: 16 18  SpO2: 98% 100%     Zachary Jacobson Liyat Faulkenberry, PA-C 08/28/12 0932

## 2012-08-28 NOTE — ED Provider Notes (Signed)
Medical screening examination/treatment/procedure(s) were conducted as a shared visit with non-physician practitioner(s) and myself.  I personally evaluated the patient during the encounter  Hx CP and seizure disorder with nausea and vomiting over the past several days.  No change in seizure frequency or characteristics. Last BM 5 days ago.Marland Kitchen Appears well hydrated and nontoxic. Abdomen soft, nontender, no periteonal signs.  No fecal impaction.  Glynn Octave, MD 08/28/12 1800

## 2012-08-28 NOTE — ED Provider Notes (Signed)
Medical screening examination/treatment/procedure(s) were conducted as a shared visit with non-physician practitioner(s) and myself.  I personally evaluated the patient during the encounter  Glynn Octave, MD 08/28/12 [phone]

## 2012-09-10 ENCOUNTER — Ambulatory Visit (INDEPENDENT_AMBULATORY_CARE_PROVIDER_SITE_OTHER): Payer: Medicaid Other | Admitting: Pediatrics

## 2012-09-10 ENCOUNTER — Encounter: Payer: Self-pay | Admitting: Pediatrics

## 2012-09-10 VITALS — Wt 124.0 lb

## 2012-09-10 DIAGNOSIS — H60399 Other infective otitis externa, unspecified ear: Secondary | ICD-10-CM

## 2012-09-10 DIAGNOSIS — H6090 Unspecified otitis externa, unspecified ear: Secondary | ICD-10-CM | POA: Insufficient documentation

## 2012-09-10 DIAGNOSIS — H6091 Unspecified otitis externa, right ear: Secondary | ICD-10-CM

## 2012-09-10 MED ORDER — ANTIPYRINE-BENZOCAINE 5.4-1.4 % OT SOLN: 3.0000 [drp] | Freq: Four times a day (QID) | OTIC | Status: DC | PRN

## 2012-09-10 MED ORDER — CIPROFLOXACIN-DEXAMETHASONE 0.3-0.1 % OT SUSP: 4.0000 [drp] | Freq: Two times a day (BID) | OTIC | Status: AC

## 2012-09-10 MED ORDER — CETIRIZINE HCL 10 MG PO TABS: 10.0000 mg | ORAL_TABLET | Freq: Every day | ORAL | Status: DC

## 2012-09-10 NOTE — Patient Instructions (Signed)
Otitis Externa Otitis externa is a bacterial or fungal infection of the outer ear canal. This is the area from the eardrum to the outside of the ear. Otitis externa is sometimes called "swimmer's ear." CAUSES  Possible causes of infection include:  Swimming in dirty water.  Moisture remaining in the ear after swimming or bathing.  Mild injury (trauma) to the ear.  Objects stuck in the ear (foreign body).  Cuts or scrapes (abrasions) on the outside of the ear. SYMPTOMS  The first symptom of infection is often itching in the ear canal. Later signs and symptoms may include swelling and redness of the ear canal, ear pain, and yellowish-white fluid (pus) coming from the ear. The ear pain may be worse when pulling on the earlobe. DIAGNOSIS  Your caregiver will perform a physical exam. A sample of fluid may be taken from the ear and examined for bacteria or fungi. TREATMENT  Antibiotic ear drops are often given for 10 to 14 days. Treatment may also include pain medicine or corticosteroids to reduce itching and swelling. PREVENTION   Keep your ear dry. Use the corner of a towel to absorb water out of the ear canal after swimming or bathing.  Avoid scratching or putting objects inside your ear. This can damage the ear canal or remove the protective wax that lines the canal. This makes it easier for bacteria and fungi to grow.  Avoid swimming in lakes, polluted water, or poorly chlorinated pools.  You may use ear drops made of rubbing alcohol and vinegar after swimming. Combine equal parts of white vinegar and alcohol in a bottle. Put 3 or 4 drops into each ear after swimming. HOME CARE INSTRUCTIONS   Apply antibiotic ear drops to the ear canal as prescribed by your caregiver.  Only take over-the-counter or prescription medicines for pain, discomfort, or fever as directed by your caregiver.  If you have diabetes, follow any additional treatment instructions from your caregiver.  Keep all  follow-up appointments as directed by your caregiver. SEEK MEDICAL CARE IF:   You have a fever.  Your ear is still red, swollen, painful, or draining pus after 3 days.  Your redness, swelling, or pain gets worse.  You have a severe headache.  You have redness, swelling, pain, or tenderness in the area behind your ear. MAKE SURE YOU:   Understand these instructions.  Will watch your condition.  Will get help right away if you are not doing well or get worse. Document Released: 05/21/2005 Document Revised: 08/13/2011 Document Reviewed: 06/07/2011 [employer] Patient Information 2013 [employer].  

## 2012-09-10 NOTE — Progress Notes (Signed)
Subjective:     Zachary [name] is a 22 y.o. 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 No fever, no congestion and no difficulty breathing.-- Symptoms have been present for 2 days. He also notes drainage in the right ear. He does have a history of ear infections. He does not have a history of recent swimming.He does have a history of TM tubes.  The patient's history has been marked as reviewed and updated as appropriate.   Review of Systems Pertinent items are noted in HPI.   Objective:    Wt 124 lb (56.246 kg)  BMI 20.96 kg/m2 General:  baseline mental status  Right Ear: Tm tubes patent and canal filled with serous fluid  Left Ear: normal appearance  Mouth:  lips, mucosa, and tongue normal; teeth and gums normal  Neck: no adenopathy, supple, symmetrical, trachea midline and thyroid not enlarged, symmetric, no tenderness/mass/nodules       Assessment:    Right otitis externa    Plan:    Treatment: Floxin Otic. Auralgan drops and zyrtec OTC analgesia as needed. Water exclusion from affected ear until symptoms resolve. Follow up in 2 days if symptoms not improving.

## 2012-09-17 ENCOUNTER — Telehealth: Payer: Self-pay | Admitting: *Deleted

## 2012-09-17 NOTE — Telephone Encounter (Signed)
I reviewed your remarks.  I agree with your assessment.  If Zachary [name] continues to have problems, we will need to bring him in as a work in to check his VNS.

## 2012-09-17 NOTE — Telephone Encounter (Signed)
[name] the patient's mom called and stated that Zachary [name] had a seizure after school on yesterday lasting about 1 minute and a half, and he had 2 seizures this morning that were small but very intense. Mom wants to know if could be that it's allergy season as to why he's having the seizures? Mom can be reached at [phone]. Thanks

## 2012-09-17 NOTE — Telephone Encounter (Signed)
I called and spoke with Mom. She said that Zachary [name] had a seizure yesterday, then had 2 brief seizures this morning. She said that he has been having problems with seasonal allergies. She gave him Zyrtec last week but not this week. He has not been drowsy or seemed sick, but has been scratching at his ears and rubbing at his nose. I talked with Mom about the seizures. His last VNS check was in late February. He has not had seizures since then. I told her that the allergies alone would not trigger seizures but feeling poorly and not resting well as a by product of allergies might. She said that she understood and that she might restart the Zyrtec. She will continue to monitor Coron and will let us know if the seizures continue.

## 2012-10-23 ENCOUNTER — Other Ambulatory Visit: Payer: Self-pay

## 2012-10-23 DIAGNOSIS — S72143A Displaced intertrochanteric fracture of unspecified femur, initial encounter for closed fracture: Secondary | ICD-10-CM | POA: Insufficient documentation

## 2012-10-23 DIAGNOSIS — Z79899 Other long term (current) drug therapy: Secondary | ICD-10-CM

## 2012-10-23 DIAGNOSIS — R61 Generalized hyperhidrosis: Secondary | ICD-10-CM

## 2012-10-23 DIAGNOSIS — G4739 Other sleep apnea: Secondary | ICD-10-CM

## 2012-10-23 DIAGNOSIS — G40319 Generalized idiopathic epilepsy and epileptic syndromes, intractable, without status epilepticus: Secondary | ICD-10-CM

## 2012-10-23 DIAGNOSIS — K5909 Other constipation: Secondary | ICD-10-CM

## 2012-10-23 DIAGNOSIS — R269 Unspecified abnormalities of gait and mobility: Secondary | ICD-10-CM | POA: Insufficient documentation

## 2012-10-23 DIAGNOSIS — F840 Autistic disorder: Secondary | ICD-10-CM

## 2012-10-23 DIAGNOSIS — G808 Other cerebral palsy: Secondary | ICD-10-CM

## 2012-10-23 DIAGNOSIS — G478 Other sleep disorders: Secondary | ICD-10-CM

## 2012-10-23 DIAGNOSIS — F71 Moderate intellectual disabilities: Secondary | ICD-10-CM

## 2012-10-23 MED ORDER — VIMPAT 200 MG PO TABS: ORAL_TABLET | ORAL | Status: DC

## 2012-10-23 MED ORDER — VIMPAT 100 MG PO TABS: ORAL_TABLET | ORAL | Status: DC

## 2012-11-07 ENCOUNTER — Encounter: Payer: Self-pay | Admitting: Pediatrics

## 2012-11-07 ENCOUNTER — Ambulatory Visit: Payer: Self-pay | Admitting: Pediatrics

## 2012-11-07 ENCOUNTER — Ambulatory Visit (INDEPENDENT_AMBULATORY_CARE_PROVIDER_SITE_OTHER): Payer: Medicaid Other | Admitting: Pediatrics

## 2012-11-07 VITALS — BP 112/74 | HR 96 | Ht 61.0 in | Wt 136.6 lb

## 2012-11-07 DIAGNOSIS — G808 Other cerebral palsy: Secondary | ICD-10-CM

## 2012-11-07 DIAGNOSIS — K5909 Other constipation: Secondary | ICD-10-CM

## 2012-11-07 DIAGNOSIS — G40319 Generalized idiopathic epilepsy and epileptic syndromes, intractable, without status epilepticus: Secondary | ICD-10-CM

## 2012-11-07 DIAGNOSIS — F848 Other pervasive developmental disorders: Secondary | ICD-10-CM

## 2012-11-07 DIAGNOSIS — F71 Moderate intellectual disabilities: Secondary | ICD-10-CM

## 2012-11-07 DIAGNOSIS — R269 Unspecified abnormalities of gait and mobility: Secondary | ICD-10-CM

## 2012-11-07 NOTE — Patient Instructions (Signed)
I'm glad that Zachary [name] is doing well.  There is no reason to change his vagal nerve stimulator, or his medicines.

## 2012-11-07 NOTE — Progress Notes (Signed)
Patient: Zachary [name] MRN: [id number] Sex: male DOB: May 28, 1991  Provider: Deetta Perla, MD Location of Care: [facility]  Note type: Routine return visit  History of Present Illness: Referral Source: Dr. Ferman Hamming History from: mother and [facility] chart Chief Complaint: Seizures  Zachary [name] is a 22 y.o. male who returns for evaluation of Intractable seizures, quadriparesis, and cognitive impairment.  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  He had three seizures in April 2014, one lasting a minute and a half on September 16, 2012, and two seizures on September 17, 2012.  He had no seizures in May 2014.  Yesterday, he had a one minute 45-second seizure that was associated with eyelid blinking, his arms were flexed and elevated above his head, his eyes rolled superiorly with eyelids open.  He had some foam from the mouth and his body was stiff.  His mother did not have the magnet for his vagal nerve stimulator immediately, but the seizure stop within 20 seconds after she stimulated it.  He was postictal for a very brief time.  She is very pleased with the seizure control.  VNS was implanted May 23, 2011, and his stimulator was gradually increased to a rapid cycle.  His seizures have declined markedly from two to five per month to the current frequency.  Minor adjustments have been made of his antiepileptic medications, but for the most part improvement seems to have been from the vagal nerve stimulator.  I interrogated the device, which will be presented below.  The device is working well and is not nearing end of service.  Procedure: interrogation of vagal nerve stimulator  The implanted device is series 102, serial [id number], implanted May 23, 2011. On interrogation:  amplitude 2.00 mA, frequency 20 Hz, pulse width 250  s, stimulation duration 7 seconds, stimulation interval 0.5  minutes, magnet amplitude 2.25  MA, magnet stimulation duration 30 seconds, magnet pulse width 250 s.  I increased the amplitude to 2.25  Milliamps and the magnet amplitude to 2.50 milliamps.  Number of stimuli since last visit is 67;  total number since implantation: 347.  Normal diagnostics with an amplitude of 2.00 milliamps again showed intact communication, output, and impedance.   DC/ DC conversion code was 3.  There is no indication to change the battery.  Generally, the patient has been healthy.  He had one episode of gastroenteritis that was seen both by primary physician and in the emergency room.  CT scan of the abdomen was unremarkable.  His symptoms subsided with liquids.  Fortunately, he did not have a flurry of seizures during the time when he was not absorbing his medication.  Review of Systems: 12 system review was remarkable for swelling in legs, itching, constipation, seizure and snoring.  Past Medical History  Diagnosis Date  . Seizures   . MR (mental retardation)   . CP (cerebral palsy)   . Hypertension    Hospitalizations: no, Head Injury: no, Nervous System Infections: no, Immunizations up to date: yes Past Medical History Comments: ER visits only due to seizure activity .  He has frequent ear infections, urinary tract infections, constipation and a hip fracture. He has had problems with neutropenia and thrombocytopenia. He had some vomiting in June, 2010 and developed aspiration pneumonia.  He tends to have a temperature of 99 in the late evening.  He  has had several episodes in which his left knee has been dislocated. This happened as recently as August 28, 2009. He sat down in a chair and his knee went out of place. EMS was called and he was taken to the ER. While waiting for the ER physician, [place] stretched and his knee went back in to place. His mother said that he was subsequently seen by his  orthopedist, who thinks that he may eventually need surgery on the knee.   I began to see him on July 10, 2004.  He apparently was seen by me when he was eight months of age with developmental delay.  Seizure activity; however, did not began until he is 14, two days after starting amitriptyline.  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  His most recent CT scan of the brain was on September 29, 2009, and showed no significant changes. EEG showed diffuse background slowing.  He was treated with Depakote, Topamax, and Lyrica, but suffered side effects and agitation from those medications.  He has taken Trileptal, Keppra, and Vimpat for quite some time.  He was seen at [facility] on Oct 27, 2010 for 6 days.for a phase 1 evaluation.  MRI of the brain showed ex-vacuo dilatation of his ventricles and cortical atrophy.  Volume loss most pronounced in the parietal lobes bilaterally, temporal lobes with widening of the sylvian fissures and a paucity of white matter diffusely most pronounced again in the parietal lobes with patchy T2 hyperintensity.  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  There was no evidence of mesial temporal sclerosis.  Magnetoencephalogram showed epileptiform activity arising from the left posterior temple region with a field extending to the occipital lobe.  This was not seen with concurrent routine EEG.  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  This was somewhat worse in the right than the left bilateral temporal cortex and temporal pole suggesting a longstanding insult.  Prolonged video EEG showed onset of bifrontal slowing that evolved into rhythmic activity  with spikes of greater amplitude over the right central region with secondary generalization.  Clinically, the patient has onset of a myoclonic jerk with his head turned to the left, movement of the left arm and leg, then tonic posturing followed by tonic-clonic generalized movements.  One day later the patient had bifrontal rhythmic beta range activity followed by decrement in voltage with fast beta range activity for about 30 seconds and then generalized rhythmic beta range activity.  The patient had a slight head bob slowly lowered to the bed with stiffening of both upper extremities and tonic-clonic movement of both upper extremities followed by secondary generalization in his legs.  A total of seven seizures were monitored.  The last three were obscured by electrode artifact.    Seizure onset appeared to occur from both frontal regions.  This led to the recommendations to place the vagal nerve stimulator.    Behavior History none  Surgical History Past Surgical History  Procedure Laterality Date  . Hip surgery    . Foot surgery    . Implantation vagal nerve stimulator    . Tympanostomy tube placement  2009  . Tonsillectomy  2009  . Pediatomy  2009  Surgeries: no Surgical History Comments: None  Family History family history includes Cancer in his paternal grandmother and Diabetes in his paternal grandfather. Family History is negative migraines, seizures, cognitive impairment, blindness, deafness, birth defects, chromosomal disorder, autism.  Social History History   Social History  . Marital Status: Single    Spouse Name: N/A    Number of Children: N/A  . Years of Education: N/A   Social History Main Topics  . Smoking status: Never Smoker   . Smokeless tobacco: Never Used  . Alcohol Use: No  . Drug Use: No  . Sexually Active: No   Other Topics Concern  . None   Social History Narrative  . None   Attends [employer] with mother and younger  brother and sister who are twins.   Current Outpatient Prescriptions on File Prior to Visit  Medication Sig Dispense Refill  . amLODipine (NORVASC) 5 MG tablet Take 1 tablet (5 mg total) by mouth 2 (two) times daily.  60 tablet  3  . cetirizine (ZYRTEC) 10 MG tablet Take 1 tablet (10 mg total) by mouth daily.  30 tablet  2  . diazepam (DIASTAT) 2.5 MG GEL Place 2.5 mg rectally as needed. For when patient has a seizure that last over 7 minutes      . fluticasone (FLONASE) 50 MCG/ACT nasal spray Place 1 spray into the nose daily.  16 g  2  . levETIRAcetam (KEPPRA) 1000 MG tablet Take 1,500 mg by mouth 2 (two) times daily.      . ondansetron (ZOFRAN ODT) 8 MG disintegrating tablet Take 1 tablet (8 mg total) by mouth every 8 (eight) hours as needed for nausea.  10 tablet  0  . ondansetron (ZOFRAN-ODT) 4 MG disintegrating tablet 1tablet q2h prn vomiting up to 6 per day  3 tablet  1  . oxcarbazepine (TRILEPTAL) 600 MG tablet Take 900 mg by mouth 2 (two) times daily.      . polyethylene glycol (MIRALAX / GLYCOLAX) packet Take 34 g by mouth 2 (two) times daily.      Marland Kitchen VIMPAT 100 MG TABS Take 1 tab by mouth in the morning and 1 tab at 3pm  60 tablet  0  . VIMPAT 200 MG TABS Take 2 tabs by mouth at bedtime  60 tablet  0   No current facility-administered medications on file prior to visit.   The medication list was reviewed and reconciled. All changes or newly prescribed medications were explained.  A complete medication list was provided to the patient/caregiver.  Allergies  Allergen Reactions  . Depakote (Divalproex Sodium) Other (See Comments)    Low Platelets  . Lyrica (Pregabalin) Other (See Comments)    Sleepiness  . Penicillins   . Topamax (Topiramate) Other (See Comments)    Weight Loss   Physical Exam BP 112/74  Pulse 96  Ht 5\' 1"  (1.549 m)  Wt 136 lb 9.6 oz (61.961 kg)  BMI 25.82 kg/m2  General: well developed, well nourished young man, seated in wheelchair in exam room. Head:  head  normocephalic and atraumatic.   Ears, Nose and Throat: oropharynx benign.  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  Neck: supple with no carotid or supraclavicular bruits. Respiratory: lung clear to auscultation.  Cardiovascular: regular rate and rhythm, no murmurs; he has edema in both feet Skin: facial acne. He has well healed surgical scars on his left neck and his left anterior chest from the  VNS Trunk:  truncal obesity  Neurologic Exam  Mental Status: Awake and alert. He does not make eye contact. He has evidence of cognitive impairment.  He resists some efforts at examination. He is unable to follow commands.  He has very limited language.  Cranial Nerves: Fundoscopic exam reveals poorly visualized disc margins.  Pupils equal, briskly reactive to light.  Extraocular movements full without nystagmus.  disconjugate  gaze with lazy eye on the right  pending that he does not regard objects in the left visual field as well as the right, he has  difficulties judging distance when coordinating movements.  Hearing appears intact as he turns to localize sounds.  Neck flexion and extension normal after VNS was implanted . Motor: Normal bulk with increased tone in all extremities.  decreased strength with formal strength testing in all extremities. wheelchair for transport .  Clumsy fine motor movements. Sensory: Intact to withdrawal x4.  Coordination: Poor quality fine motor movements.  Gait and Station: Arises from chair without difficulty.  Stance is narrow based  Gait demonstrates diplegic gait that is slow with shortened stride length.  Varus position of his feet . He walked fairly well today without any assistance. Reflexes: Diminished and symmetric. Toes downgoing. He had no clonus today.  Assessment 1. 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 2. 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 3. Constipation. 4. 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 5. 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 6. Constipation, 564.09.  Discussion and Plan The patient is doing well with his medications and vagal nerve stimulator.  I plan no medical changes.  I assessed the patient and also evaluated his vagal nerve stimulator today.  He did not require a prescription refills.  Deetta Perla MD

## 2012-11-19 ENCOUNTER — Other Ambulatory Visit: Payer: Self-pay

## 2012-11-19 DIAGNOSIS — G40319 Generalized idiopathic epilepsy and epileptic syndromes, intractable, without status epilepticus: Secondary | ICD-10-CM

## 2012-11-19 MED ORDER — KEPPRA 1000 MG PO TABS: ORAL_TABLET | ORAL | Status: DC

## 2012-11-19 MED ORDER — VIMPAT 200 MG PO TABS: ORAL_TABLET | ORAL | Status: DC

## 2012-11-19 MED ORDER — VIMPAT 100 MG PO TABS: ORAL_TABLET | ORAL | Status: DC

## 2012-12-01 ENCOUNTER — Ambulatory Visit (INDEPENDENT_AMBULATORY_CARE_PROVIDER_SITE_OTHER): Payer: Medicaid Other | Admitting: Pediatrics

## 2012-12-01 ENCOUNTER — Telehealth: Payer: Self-pay | Admitting: *Deleted

## 2012-12-01 ENCOUNTER — Encounter: Payer: Self-pay | Admitting: Pediatrics

## 2012-12-01 ENCOUNTER — Ambulatory Visit: Payer: Medicaid Other | Admitting: Pediatrics

## 2012-12-01 VITALS — BP 110/76 | HR 84 | Ht 61.0 in | Wt 133.6 lb

## 2012-12-01 DIAGNOSIS — G4739 Other sleep apnea: Secondary | ICD-10-CM

## 2012-12-01 DIAGNOSIS — L738 Other specified follicular disorders: Secondary | ICD-10-CM

## 2012-12-01 DIAGNOSIS — N390 Urinary tract infection, site not specified: Secondary | ICD-10-CM

## 2012-12-01 DIAGNOSIS — F848 Other pervasive developmental disorders: Secondary | ICD-10-CM

## 2012-12-01 DIAGNOSIS — G40319 Generalized idiopathic epilepsy and epileptic syndromes, intractable, without status epilepticus: Secondary | ICD-10-CM

## 2012-12-01 DIAGNOSIS — F71 Moderate intellectual disabilities: Secondary | ICD-10-CM

## 2012-12-01 DIAGNOSIS — G808 Other cerebral palsy: Secondary | ICD-10-CM

## 2012-12-01 DIAGNOSIS — L739 Follicular disorder, unspecified: Secondary | ICD-10-CM

## 2012-12-01 MED ORDER — SULFAMETHOXAZOLE-TMP DS 800-160 MG PO TABS: 1.0000 | ORAL_TABLET | Freq: Two times a day (BID) | ORAL | Status: AC

## 2012-12-01 MED ORDER — CEPHALEXIN 500 MG PO CAPS: 500.0000 mg | ORAL_CAPSULE | Freq: Three times a day (TID) | ORAL | Status: AC

## 2012-12-01 MED ORDER — MUPIROCIN 2 % EX OINT: TOPICAL_OINTMENT | Freq: Three times a day (TID) | CUTANEOUS | Status: AC

## 2012-12-01 MED ORDER — OXTELLAR XR 600 MG PO TB24: 1800.0000 mg | ORAL_TABLET | Freq: Every day | ORAL | Status: DC

## 2012-12-01 NOTE — Telephone Encounter (Signed)
Dr. Sharene Skeans had an opening today at 3:30 arrival time of 3:15 pm and mom agreed to bring him in at that time. MB

## 2012-12-01 NOTE — Progress Notes (Signed)
Patient: Zachary [name] MRN: [id number] Sex: male DOB: 1990/12/18  Provider: Deetta Perla, MD Location of Care: [facility]  Note type: Routine return visit  History of Present Illness: Referral Source: Dr. Ferman Hamming History from: mother and [facility] chart Chief Complaint: Seizures  Zachary [name] is a 22 y.o. male who returns for evaluation and management of intractable secondarily generalized seizures.  He returns today December 01, 2012, for the first time since November 07, 2012.  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  I saw him today, because since his last visit he has had seizures on four occasions November 03, 2012, November 13, 2012, November 21, 2012, and November 28, 2012.  These lasted on an average 2 minutes, but two were as long as three minutes.  One occurred at school.  He has generalized tonic-clonic activities with eyelid blinking, arms flexed and elevated above his head, eyes rolled superiorly with eyelids opened, foam from his mouth, and stiffening of his body.  His mother attempted to abort the events that she saw without success.  Over the past four days, the patient has had decreased appetite and is not drinking.  She thinks he is dehydrated although he does not appear that way.  He is being treated for urinary tract infection, urinalysis is pending at this time.  He seems to have a nonproductive cough.  He did not sleep well last night.  He is also constipated.  This is the most frequent seizures that he has had since April 2014.  His mother is appropriately concerned.  I am not certain there is much that we can do.  Until four days ago, his health had been good.  He has not run fevers.  He is taking his medications as prescribed.  It appears that he has lost about three pounds since his last visit.  I interrogated his vagal nerve stimulator and it is noted below.  Procedure: interrogation of  vagal nerve stimulator  The implanted device is series 102, serial [id number], implanted May 23, 2011.  On interrogation: amplitude 2.00 mA, frequency 20 Hz, pulse width 250 s, stimulation duration 7 seconds, stimulation interval 0.5 minutes, magnet amplitude 2.25 MA, magnet stimulation duration 30 seconds, magnet pulse width 250 s.  I increased the amplitude to 2.50 Milliamps and the magnet amplitude to 2.75 milliamps.  Number of stimuli since last visit is 23; total number since implantation: 370.  Normal diagnostics with an amplitude of 2.25 milliamps again showed intact communication, output, and impedance. DC/ DC conversion code was 3. There is no indication to change the battery.  Review of Systems: 12 system review was remarkable for cough, snoring, constipation, seizure, not enough sleep and change in appetite.  Past Medical History  Diagnosis Date  . Seizures   . MR (mental retardation)   . CP (cerebral palsy)   . Hypertension   . Fracture of femur, intertrochanteric, closed    Hospitalizations: no, Head Injury: no, Nervous System Infections: no, Immunizations up to date: yes Past Medical History Comments: ER visits only due to seizure activity.  He has frequent ear infections, urinary tract infections, constipation and a hip fracture. He has had problems with neutropenia and thrombocytopenia. He had some vomiting in June, 2010 and developed aspiration pneumonia. He tends to have a temperature of 99 in the late evening. He has had several episodes in which his left knee has been dislocated. This happened  as recently as August 28, 2009. He sat down in a chair and his knee went out of place. EMS was called and he was taken to the ER. While waiting for the ER physician, Sheryl stretched and his knee went back in to place. His mother said that he was subsequently seen by his orthopedist, who thinks that he may eventually need surgery on the knee.   I began to see him on July 10, 2004.  He apparently was seen by me when he was eight months of age with developmental delay. Seizure activity; however, did not began until he is 14, two days after starting amitriptyline. 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 His most recent CT scan of the brain was on September 29, 2009, and showed no significant changes. EEG showed diffuse background slowing.   He was treated with Depakote, Topamax, and Lyrica, but suffered side effects and agitation from those medications. He has taken Trileptal, Keppra, and Vimpat for quite some time. He was seen at [facility] on Oct 27, 2010 for 6 days.for a phase 1 evaluation. MRI of the brain showed ex-vacuo dilatation of his ventricles and cortical atrophy. Volume loss most pronounced in the parietal lobes bilaterally, temporal lobes with widening of the sylvian fissures and a paucity of white matter diffusely most pronounced again in the parietal lobes with patchy T2 hyperintensity. 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 There was no evidence of mesial temporal sclerosis. Magnetoencephalogram showed epileptiform activity arising from the left posterior temple region with a field extending to the occipital lobe. This was not seen with concurrent routine EEG.   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 This was somewhat worse in the right than the left bilateral temporal cortex and temporal pole suggesting a longstanding insult.  Prolonged video EEG showed onset of bifrontal slowing that evolved into rhythmic activity with spikes of greater amplitude over the right central region with secondary generalization. Clinically, the patient has onset of  a myoclonic jerk with his head turned to the left, movement of the left arm and leg, then tonic posturing followed by tonic-clonic generalized movements.   One day later the patient had bifrontal rhythmic beta range activity followed by decrement in voltage with fast beta range activity for about 30 seconds and then generalized rhythmic beta range activity. The patient had a slight head bob slowly lowered to the bed with stiffening of both upper extremities and tonic-clonic movement of both upper extremities followed by secondary generalization in his legs. A total of seven seizures were monitored. The last three were obscured by electrode artifact.  Seizure onset appeared to occur from both frontal regions. This led to the recommendations to place the vagal nerve stimulator.   Behavior History none  Surgical History Past Surgical History  Procedure Laterality Date  . Hip surgery    . Foot surgery    . Implantation vagal nerve stimulator    . Tympanostomy tube placement  2009  . Tonsillectomy  2009  . Pediatomy  2009   Surgeries: no Surgical History Comments: None  Family History family history includes Cancer in his paternal grandmother and Diabetes in his paternal grandfather. Family History is negative migraines, seizures, cognitive  impairment, blindness, deafness, birth defects, chromosomal disorder, autism.  Social History History   Social History  . Marital Status: Single    Spouse Name: N/A    Number of Children: N/A  . Years of Education: N/A   Social History Main Topics  . Smoking status: Never Smoker   . Smokeless tobacco: Never Used  . Alcohol Use: No  . Drug Use: No  . Sexually Active: No   Other Topics Concern  . None   Social History Narrative  . None   Living with mother and younger brother and sister who are twins.   Current Outpatient Prescriptions on File Prior to Visit  Medication Sig Dispense Refill  . amLODipine (NORVASC) 5 MG tablet Take 1  tablet (5 mg total) by mouth 2 (two) times daily.  60 tablet  3  . cephALEXin (KEFLEX) 500 MG capsule Take 1 capsule (500 mg total) by mouth 3 (three) times daily.  30 capsule  0  . cetirizine (ZYRTEC) 10 MG tablet Take 1 tablet (10 mg total) by mouth daily.  30 tablet  2  . diazepam (DIASTAT) 2.5 MG GEL Place 2.5 mg rectally as needed. For when patient has a seizure that last over 7 minutes      . fluticasone (FLONASE) 50 MCG/ACT nasal spray Place 1 spray into the nose daily.  16 g  2  . KEPPRA 1000 MG tablet Take 1&1/2 tabs twice daily  93 tablet  5  . mupirocin ointment (BACTROBAN) 2 % Apply topically 3 (three) times daily.  30 g  0  . ondansetron (ZOFRAN ODT) 8 MG disintegrating tablet Take 1 tablet (8 mg total) by mouth every 8 (eight) hours as needed for nausea.  10 tablet  0  . polyethylene glycol (MIRALAX / GLYCOLAX) packet Take 17 g by mouth daily. Can increase to 4 capfuls a day if needed      . sulfamethoxazole-trimethoprim (BACTRIM DS) 800-160 MG per tablet Take 1 tablet by mouth 2 (two) times daily.  20 tablet  0  . VIMPAT 100 MG TABS Take 1 tab by mouth in the morning and 1 tab at 3pm  60 tablet  5  . VIMPAT 200 MG TABS Take 2 tabs by mouth at bedtime  60 tablet  5  . salicylic acid 6 % gel Apply topically daily. [facility] OTC acne med       No current facility-administered medications on file prior to visit.   The medication list was reviewed and reconciled. All changes or newly prescribed medications were explained.  A complete medication list was provided to the patient/caregiver.  Allergies  Allergen Reactions  . Depakote (Divalproex Sodium) Other (See Comments)    Low Platelets  . Lyrica (Pregabalin) Other (See Comments)    Sleepiness  . Penicillins   . Topamax (Topiramate) Other (See Comments)    Weight Loss   Physical Exam BP 110/76  Pulse 84  Ht 5\' 1"  (1.549 m)  Wt 133 lb 9.6 oz (60.601 kg)  BMI 25.26 kg/m2  General: well developed, well nourished young man, seated  in wheelchair in exam room.  Head: head normocephalic and atraumatic.  Ears, Nose and Throat: oropharynx benign. 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  Neck: supple with no carotid or supraclavicular bruits.  Respiratory: lung clear to auscultation.  Cardiovascular: regular rate and rhythm, no murmurs; he has edema in both feet  Skin: facial acne. He has well healed surgical scars on his left neck  and his left anterior chest from the VNS  Trunk: truncal obesity   Neurologic Exam  Mental Status: Awake and alert. He does not make eye contact. He has evidence of cognitive impairment. He resists some efforts at examination. He is unable to follow commands. He has very limited language.  Cranial Nerves: Fundoscopic exam reveals poorly visualized disc margins. Pupils equal, briskly reactive to light. Extraocular movements full without nystagmus. disconjugate gaze with lazy eye on the right.  He does not regard objects in the left visual field as well as the right, he has difficulties judging distance when coordinating movements. Hearing appears intact as he turns to localize sounds. Neck flexion and extension normal after VNS was implanted .  Motor: Normal bulk with increased tone in all extremities. decreased strength with formal strength testing in all extremities. wheelchair for transport . Clumsy fine motor movements. 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 Sensory: Intact to withdrawal x4.  Coordination: Poor quality fine motor movements.  Gait and Station: Arises from chair without difficulty. Stance is narrow based. Gait demonstrates diplegic gait that is slow with shortened stride length. Varus position of his feet . He walked fairly well today without any assistance.  Reflexes: Diminished and symmetric. Toes downgoing. He had no clonus today.  Assessment 1. Intractable generalized tonic-clonic seizures  345.11. 2. 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 3. 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 4. 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  Plan I changed Trileptal from 900 mg twice daily to Oxtellar XR 600 mg three at bedtime.  We will observe his response and gave mother a card to diminish her co-pay.  More importantly, I would like to even-out his oxcarbazepine level and see if that makes a difference.  I also increased his VNS amplitude to 2.5 mA and the magnet amplitude to 2.75 mA.  I am probably not going to increase the dose of the VNS again for quite some time having made changes twice in the last month.  He seem to be at neurologic baseline today.  We will see him in three months' time sooner depending upon clinical need.  Deetta Perla MD

## 2012-12-01 NOTE — Patient Instructions (Signed)
Continue call the office every week or every other week as long as he has frequent seizures.  Let me know how he is tolerating Oxtellar XR.  I increased the VNS stimulation.  It is going to take time to see any improvement.

## 2012-12-01 NOTE — Progress Notes (Signed)
Subjective:    Patient ID: Zachary [name], male   DOB: 1990-09-21, 22 y.o.   MRN: [id number]  HPI: Here with mom. 22 yr old male, nonverbal, in wheel chair. Not feeling well for 4 days. No fever, no runny nose, + mild dry cough, Not eating, not sleeping, just laying around, moaning more. More seizures and lasting longer -- not as responsive with the magnet. Has had at least once a week since June 2. Has call into Dr. Darl Householder office.Has some acne and uses salicylic acid gel OTC on face, but  more pustular bumps have been popping up on face and hairy surfaces coinsiding with onset of vague Sx of not feeling well. All superficial. No deep abscesses or nodules. Mom popped one and got out some bloody fluid. 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 GEN: Alert, quiet, sitting in wheel chair HEENT:     Head: normocephalic    TMs: tubes in ears, patent, no drainage and normal TM's    Nose: clear   Throat: no exudates or vesicles    Eyes:  no periorbital swelling, no conjunctival injection or discharge NECK: supple, no masses NODES: neg CHEST: symmetrical LUNGS: clear to aus, BS equal  COR: No murmur, RRR SKIN: well perfused, facial acne, scattered tiny pustules in hairy areas -- arms, legs. Very small with pustular heads, no sub cu nodularity, to surrounding erythema. Nothing culturable.   No results found. No results found for this or any previous visit (from the past 240 hour(s)). @RESULTS @ Assessment:  Folliculitis Hx of MRSA  Plan:  Reviewed findings Scrub lesions with  washcloth Apply mupirocin Resume bleach baths Sent home with culturette to take a swab if develops any larger pussy bumps that drain. Keflex per Rx, but if no response in 2 days, start on Bactrim for 10 days Recheck if develops fever, deeper skin lesions, other signs and Sx Will call Dr. Rexene Edison about seizure control

## 2012-12-01 NOTE — Telephone Encounter (Signed)
[name] the patient's mom called and stated that the patient has been having seizures every week lasting longer than usual even with the swipe of the VNS magnet the seizures are lasting 2 to 3 minutes. Friday he had a seizure that lasted 2 to 3 minutes. Mom can be reached at [phone], Guiseppe had an appointment with his pcp this morning to check to see if he has an ear infection, she also said that the patient is not eating much at all.    Thanks,  Belenda Cruise.

## 2012-12-01 NOTE — Patient Instructions (Signed)
Folliculitis  Folliculitis is redness, soreness, and swelling (inflammation) of the hair follicles. This condition can occur anywhere on the body. People with weakened immune systems, diabetes, or obesity have a greater risk of getting folliculitis. CAUSES  Bacterial infection. This is the most common cause.  Fungal infection.  Viral infection.  Contact with certain chemicals, especially oils and tars. Long-term folliculitis can result from bacteria that live in the nostrils. The bacteria may trigger multiple outbreaks of folliculitis over time. SYMPTOMS Folliculitis most commonly occurs on the scalp, thighs, legs, back, buttocks, and areas where hair is shaved frequently. An early sign of folliculitis is a small, white or yellow, pus-filled, itchy lesion (pustule). These lesions appear on a red, inflamed follicle. They are usually less than 0.2 inches (5 mm) wide. When there is an infection of the follicle that goes deeper, it becomes a boil or furuncle. A group of closely packed boils creates a larger lesion (carbuncle). Carbuncles tend to occur in hairy, sweaty areas of the body. DIAGNOSIS  Your caregiver can usually tell what is wrong by doing a physical exam. A sample may be taken from one of the lesions and tested in a lab. This can help determine what is causing your folliculitis. TREATMENT  Treatment may include:  Applying warm compresses to the affected areas.  Taking antibiotic medicines orally or applying them to the skin.  Draining the lesions if they contain a large amount of pus or fluid.  Laser hair removal for cases of long-lasting folliculitis. This helps to prevent regrowth of the hair. HOME CARE INSTRUCTIONS  Apply warm compresses to the affected areas as directed by your caregiver.  If antibiotics are prescribed, take them as directed. Finish them even if you start to feel better.  You may take over-the-counter medicines to relieve itching.  Do not shave  irritated skin.  Follow up with your caregiver as directed. SEEK IMMEDIATE MEDICAL CARE IF:   You have increasing redness, swelling, or pain in the affected area.  You have a fever. MAKE SURE YOU:  Understand these instructions.  Will watch your condition.  Will get help right away if you are not doing well or get worse. Document Released: 07/30/2001 Document Revised: 11/20/2011 Document Reviewed: 08/21/2011 [employer] Patient Information 2014 [employer].  

## 2012-12-03 ENCOUNTER — Ambulatory Visit: Payer: Medicaid Other | Admitting: Pediatrics

## 2012-12-14 ENCOUNTER — Encounter (HOSPITAL_COMMUNITY): Payer: Self-pay | Admitting: Emergency Medicine

## 2012-12-14 ENCOUNTER — Emergency Department (HOSPITAL_COMMUNITY): Payer: Medicaid Other

## 2012-12-14 ENCOUNTER — Emergency Department (HOSPITAL_COMMUNITY)
Admission: EM | Admit: 2012-12-14 | Discharge: 2012-12-14 | Disposition: A | Discharge status: Home / Self Care | Payer: Medicaid Other | Attending: Emergency Medicine | Admitting: Emergency Medicine

## 2012-12-14 DIAGNOSIS — I10 Essential (primary) hypertension: Secondary | ICD-10-CM | POA: Insufficient documentation

## 2012-12-14 DIAGNOSIS — Z8781 Personal history of (healed) traumatic fracture: Secondary | ICD-10-CM | POA: Insufficient documentation

## 2012-12-14 DIAGNOSIS — M25572 Pain in left ankle and joints of left foot: Secondary | ICD-10-CM

## 2012-12-14 DIAGNOSIS — F79 Unspecified intellectual disabilities: Secondary | ICD-10-CM | POA: Insufficient documentation

## 2012-12-14 DIAGNOSIS — Y9301 Activity, walking, marching and hiking: Secondary | ICD-10-CM | POA: Insufficient documentation

## 2012-12-14 DIAGNOSIS — G40909 Epilepsy, unspecified, not intractable, without status epilepticus: Secondary | ICD-10-CM | POA: Insufficient documentation

## 2012-12-14 DIAGNOSIS — R609 Edema, unspecified: Secondary | ICD-10-CM | POA: Insufficient documentation

## 2012-12-14 DIAGNOSIS — Z880 Allergy status to penicillin: Secondary | ICD-10-CM | POA: Insufficient documentation

## 2012-12-14 DIAGNOSIS — S99929A Unspecified injury of unspecified foot, initial encounter: Secondary | ICD-10-CM | POA: Insufficient documentation

## 2012-12-14 DIAGNOSIS — M25561 Pain in right knee: Secondary | ICD-10-CM

## 2012-12-14 DIAGNOSIS — S8990XA Unspecified injury of unspecified lower leg, initial encounter: Secondary | ICD-10-CM | POA: Insufficient documentation

## 2012-12-14 DIAGNOSIS — Y92009 Unspecified place in unspecified non-institutional (private) residence as the place of occurrence of the external cause: Secondary | ICD-10-CM | POA: Insufficient documentation

## 2012-12-14 DIAGNOSIS — X500XXA Overexertion from strenuous movement or load, initial encounter: Secondary | ICD-10-CM | POA: Insufficient documentation

## 2012-12-14 MED ORDER — ACETAMINOPHEN 325 MG PO TABS
650.0000 mg | ORAL_TABLET | Freq: Once | ORAL | Status: AC
  Administered 2012-12-14: 650 mg via ORAL
  Filled 2012-12-14: qty 2

## 2012-12-14 NOTE — ED Provider Notes (Signed)
History    CSN: [id number] Arrival date & time 12/14/12  1191  None    Chief Complaint  Patient presents with  . Knee Pain   (Consider location/radiation/quality/duration/timing/severity/associated sxs/prior Treatment) HPI Comments: 22 y.o. Non-verbal male with PMHx that includes cognitive disability (MR and CP), seizures, left femur fx repair, presents with his mother today complaining of left ankle and left foot pain as well as right knee pain. Pt ambulates with in-toeing and per grandmother, was walking in the living room when grandmother heard a "pop" and pt lost his balance, rolling his ankle, and falling on top of her. Mother did her best to evaluate the pt, knowing his sounds and gestures, with noted swelling to left knee and his withdrawing to touch of both left knee and right ankle/foot brought him here for evaluation. She states the pt has "brittle bones" and is concerned that a seemingly benign fall could provoke bony injury as happened similarly when he fractured his left hip.   Level 5 caveat applies for ROS  Orthopedist - Dr. Gomez Cleverly - Dr. Sharene Skeans PCP - Dr. Ane Payment.   Patient is a 22 y.o. male presenting with knee pain.  Knee Pain  Past Medical History  Diagnosis Date  . Seizures   . MR (mental retardation)   . CP (cerebral palsy)   . Hypertension   . Fracture of femur, intertrochanteric, closed    Past Surgical History  Procedure Laterality Date  . Hip surgery    . Foot surgery    . Implantation vagal nerve stimulator    . Tympanostomy tube placement  2009  . Tonsillectomy  2009  . Pediatomy  2009   Family History  Problem Relation Age of Onset  . Cancer Paternal Grandmother     Died in her 58's  . Diabetes Paternal Grandfather     Died in his 54's    History  Substance Use Topics  . Smoking status: Never Smoker   . Smokeless tobacco: Never Used  . Alcohol Use: No    Review of Systems  Unable to perform ROS: Patient nonverbal     Allergies  Depakote; Lyrica; Penicillins; and Topamax  Home Medications   Current Outpatient Rx  Name  Route  Sig  Dispense  Refill  . amLODipine (NORVASC) 5 MG tablet   Oral   Take 1 tablet (5 mg total) by mouth 2 (two) times daily.   60 tablet   3   . cetirizine (ZYRTEC) 10 MG tablet   Oral   Take 1 tablet (10 mg total) by mouth daily.   30 tablet   2   . diazepam (DIASTAT) 2.5 MG GEL   Rectal   Place 2.5 mg rectally as needed. For when patient has a seizure that last over 7 minutes         . fluticasone (FLONASE) 50 MCG/ACT nasal spray   Nasal   Place 1 spray into the nose daily.   16 g   2   . KEPPRA 1000 MG tablet      Take 1&1/2 tabs twice daily   93 tablet   5     Dispense as written.    Brand Name Medically Necessary   . ondansetron (ZOFRAN ODT) 8 MG disintegrating tablet   Oral   Take 1 tablet (8 mg total) by mouth every 8 (eight) hours as needed for nausea.   10 tablet   0   . OXTELLAR XR 600 MG TB24  Oral   Take 1,800 mg by mouth at bedtime.   93 tablet   5     Dispense as written.   . polyethylene glycol (MIRALAX / GLYCOLAX) packet   Oral   Take 17 g by mouth daily. Can increase to 4 capfuls a day if needed         . salicylic acid 6 % gel   Topical   Apply topically daily. [facility] OTC acne med         . VIMPAT 100 MG TABS      Take 1 tab by mouth in the morning and 1 tab at 3pm   60 tablet   5     Dispense as written.    Brand Name Medically Necessary   . VIMPAT 200 MG TABS      Take 2 tabs by mouth at bedtime   60 tablet   5     Dispense as written.    Brand Name Medically Necessary    BP 149/85  Pulse 75  Temp(Src) 97.9 F (36.6 C) (Axillary)  Resp 20  SpO2 95% Physical Exam  Nursing note and vitals reviewed. Constitutional: He appears well-developed and well-nourished. No distress.  Pt non verbal with diminished cognitive ability  HENT:  Head: Normocephalic and atraumatic.  Eyes: Conjunctivae and EOM  are normal.  Neck: Normal range of motion. Neck supple.  No meningeal signs  Cardiovascular: Normal rate, regular rhythm, normal heart sounds and intact distal pulses.   Pulmonary/Chest: Effort normal and breath sounds normal. No respiratory distress. He has no wheezes. He has no rales. He exhibits no tenderness.  Abdominal: Soft. Bowel sounds are normal. There is no tenderness.  Musculoskeletal: He exhibits edema and tenderness.  Unable to determine active ROM d/t pt inability to communicate. Passive FROM of left knee and right ankle. Swelling to both. No warmth. No erythema. Pt does withdraw to pain of both left knee and right ankle on exam. Pt bilateral feet do have a a mild clubbing appearance which mother states is baseline    Neurological:  Unable to assess due to pt baseline inability to follow commands.    Skin: Skin is warm and dry. He is not diaphoretic. No erythema.    ED Course  Procedures (including critical care time) Labs Reviewed - No data to display Dg Ankle Complete Left  12/14/2012   *RADIOLOGY REPORT*  Clinical Data: Left ankle pain.  LEFT ANKLE COMPLETE - 3+ VIEW  Comparison: None.  Findings: No acute fracture or dislocation is identified. Alignment of the ankle mortise is normal.  Soft tissues are unremarkable.  No significant arthropathy or evidence of bony lesions.  IMPRESSION: Normal left ankle.   Original Report Authenticated By: Irish Lack, M.D.   Dg Knee Complete 4 Views Right  12/14/2012   *RADIOLOGY REPORT*  Clinical Data: Injury with right knee pain.  RIGHT KNEE - COMPLETE 4+ VIEW  Comparison: 08/28/2009  Findings: Appearance of the knee is stable with no evidence of fracture, dislocation, joint effusion or significant arthropathy. No bony lesions or destruction identified.  Soft tissues are unremarkable.  IMPRESSION: Stable and normal right knee radiographs.   Original Report Authenticated By: Irish Lack, M.D.   Dg Foot Complete Left  12/14/2012    *RADIOLOGY REPORT*  Clinical Data: Left foot pain.  LEFT FOOT - COMPLETE 3+ VIEW  Comparison: None.  Findings: No fracture, dislocation or significant arthropathy is identified.  There is no evidence of bony lesion or destruction.  Soft tissues are unremarkable.  IMPRESSION: Normal left foot.   Original Report Authenticated By: Irish Lack, M.D.   1. Right knee pain   2. Left ankle pain     MDM  Pt is non-verbal. Difficult to examine as he withdraws during PE, but it is obvious the right foot and left knee give him pain. Both are edematous, but non-erythematous, not warm to touch. Full passive ROM appears intact. Distal pulses intact. Unable to assess sensation to touch. Pt is able to lift left leg against gravity so low concern for quadricep rupture. No deformity, no crepitus, no fever or impaired ROM, not concerned for acute bony injury or septic arthritis. Low suspicion for fx given mechanism and PE, but must consider mother's statement that pt has hx of fx with relatively low MOI. Will image.   Imaging shows no fracture. Directed pt's mother to ice injury, take acetaminophen or ibuprofen for pain, and to elevate and rest the injury when possible and as tolerated by the pt. Will order knee immobilizer for comfort. Mother states pt cannot tolerate cruthces. Will offer wheelchair assitance to her car and mother states she has a wheelchair at home for pt. Mother has relationship with Dr. Greig Right (ortho) office who did his hip surgery. Ortho follow up suggested if mother feels pt injury is not healing well. Discussed reasons to seek immediate care. Pt mother expresses understanding and agrees with plan. Discussed pt case with Dr. Oletta Lamas who is in agreement as well.     Glade Nurse, PA-C 12/14/12 [phone]

## 2012-12-14 NOTE — ED Notes (Signed)
Bed:WA24<BR> Expected date:<BR> Expected time:<BR> Means of arrival:<BR> Comments:<BR> ems

## 2012-12-14 NOTE — ED Notes (Signed)
Per ems: The patient was walking in the living room and the patients knee popped. The patient is now withdrawing to pain to his right knee. The patient is non - verbal

## 2012-12-14 NOTE — ED Provider Notes (Signed)
Medical screening examination/treatment/procedure(s) were performed by non-physician practitioner and as supervising physician I was immediately available for consultation/collaboration.   Gavin Pound. Oletta Lamas, MD 12/14/12 2216

## 2012-12-23 ENCOUNTER — Other Ambulatory Visit: Payer: Self-pay | Admitting: Pediatrics

## 2013-01-10 ENCOUNTER — Ambulatory Visit (INDEPENDENT_AMBULATORY_CARE_PROVIDER_SITE_OTHER): Payer: Medicaid Other | Admitting: Pediatrics

## 2013-01-10 ENCOUNTER — Ambulatory Visit (HOSPITAL_COMMUNITY)
Admission: RE | Admit: 2013-01-10 | Discharge: 2013-01-10 | Disposition: A | Discharge status: Home / Self Care | Payer: Medicaid Other | Source: Ambulatory Visit | Attending: Pediatrics | Admitting: Pediatrics

## 2013-01-10 ENCOUNTER — Encounter: Payer: Self-pay | Admitting: Pediatrics

## 2013-01-10 ENCOUNTER — Ambulatory Visit (HOSPITAL_COMMUNITY): Admission: EM | Admit: 2013-01-10 | Payer: Medicaid Other | Source: Ambulatory Visit | Admitting: Pediatrics

## 2013-01-10 DIAGNOSIS — R062 Wheezing: Secondary | ICD-10-CM | POA: Insufficient documentation

## 2013-01-10 DIAGNOSIS — R0602 Shortness of breath: Secondary | ICD-10-CM | POA: Insufficient documentation

## 2013-01-10 DIAGNOSIS — R600 Localized edema: Secondary | ICD-10-CM | POA: Insufficient documentation

## 2013-01-10 DIAGNOSIS — R609 Edema, unspecified: Secondary | ICD-10-CM

## 2013-01-10 NOTE — Progress Notes (Signed)
Presents for evaluation of shortness of breath and intermittent swelling of feet for the past few weeks. No fever, no congestion and no significant change to his neurological status. Caregiver concerned about the welling and fatigue--has an appointment with nephrology in September and is being folowed by orthopedics and peds neurology.  On exam he does have dependent edema to both ankles but his breath sounds are normal with no evidence of wheezing or chest congestion. He is in a wheelchair and mental status is baseline with no increased agitation or discomfort.  Rest of exam is normal/baseline  Imp--fluid retention  Plan--Will send for chest X ray now and follow up with report  Will order CBC and CMP to be done on Monday to check renal status.

## 2013-01-10 NOTE — Patient Instructions (Signed)
Chest X ray at [facility] today--will call with report  Labs for CBC, CMP on Monday.

## 2013-01-12 LAB — COMPREHENSIVE METABOLIC PANEL
ALT: 24 U/L (ref 0–53)
AST: 18 U/L (ref 0–37)
Albumin: 4.2 g/dL (ref 3.5–5.2)
Alkaline Phosphatase: 49 U/L (ref 39–117)
Glucose, Bld: 84 mg/dL (ref 70–99)
Potassium: 4.1 mEq/L (ref 3.5–5.3)
Sodium: 139 mEq/L (ref 135–145)
Total Protein: 6.9 g/dL (ref 6.0–8.3)

## 2013-01-12 LAB — CBC WITH DIFFERENTIAL/PLATELET
Basophils Absolute: 0 10*3/uL (ref 0.0–0.1)
Basophils Relative: 0 % (ref 0–1)
Hemoglobin: 14.5 g/dL (ref 13.0–17.0)
MCHC: 35.1 g/dL (ref 30.0–36.0)
Monocytes Relative: 12 % (ref 3–12)
Neutro Abs: 2.9 10*3/uL (ref 1.7–7.7)
Neutrophils Relative %: 59 % (ref 43–77)

## 2013-01-14 ENCOUNTER — Telehealth: Payer: Self-pay | Admitting: Pediatrics

## 2013-01-14 NOTE — Telephone Encounter (Signed)
Called mom to inform that chest X ray and Blood tests were normal---she did not answer--message was not left on results--she has to cal back

## 2013-01-20 ENCOUNTER — Ambulatory Visit (INDEPENDENT_AMBULATORY_CARE_PROVIDER_SITE_OTHER): Payer: Medicaid Other | Admitting: Pediatrics

## 2013-01-20 VITALS — BP 144/90 | Wt 138.0 lb

## 2013-01-20 DIAGNOSIS — M25461 Effusion, right knee: Secondary | ICD-10-CM

## 2013-01-20 DIAGNOSIS — G40319 Generalized idiopathic epilepsy and epileptic syndromes, intractable, without status epilepticus: Secondary | ICD-10-CM

## 2013-01-20 DIAGNOSIS — R600 Localized edema: Secondary | ICD-10-CM

## 2013-01-20 DIAGNOSIS — I10 Essential (primary) hypertension: Secondary | ICD-10-CM

## 2013-01-20 DIAGNOSIS — M25469 Effusion, unspecified knee: Secondary | ICD-10-CM

## 2013-01-20 DIAGNOSIS — F840 Autistic disorder: Secondary | ICD-10-CM

## 2013-01-20 DIAGNOSIS — R609 Edema, unspecified: Secondary | ICD-10-CM

## 2013-01-20 DIAGNOSIS — Z003 Encounter for examination for adolescent development state: Secondary | ICD-10-CM

## 2013-01-20 MED ORDER — FLUOCINOLONE ACETONIDE 0.01 % EX OIL: TOPICAL_OIL | Freq: Two times a day (BID) | CUTANEOUS | Status: DC

## 2013-01-20 MED ORDER — AMLODIPINE BESYLATE 10 MG PO TABS: ORAL_TABLET | ORAL | Status: DC

## 2013-01-20 MED ORDER — OXCARBAZEPINE ER 600 MG PO TB24: 1800.0000 mg | ORAL_TABLET | Freq: Every day | ORAL | Status: DC

## 2013-01-20 NOTE — Patient Instructions (Signed)
Orthopedic follow-up for R knee swelling next Tuesday (8/26)  Blood pressure: 1. Measure BP twice per day between tomorrow and next Monday 2. Send [place] a list of these measurements on Monday (8/25) 3. Dr.Hooker will look up alternatives to BP medication  Dr. Ane Payment will complete forms for [facility]

## 2013-01-20 NOTE — Progress Notes (Deleted)
1. R knee swelling and pain: About 1 month ago, dislocated R knee (popped out and then back in) Has been vocalizing more when walking, unable to do normal activities on leg Mother has noticed persistent swelling in the affected knee Refuses to wear knee brace, going for Ortho follow-up on 01/27/2013 May have MRI on knee after follow-up  2. Seizures:  Medication changes about 1 month ago, discontinued Trileptal, started an extended release form (Oxtellar XR; 3 600-mg tablets at bedtime) Only one seizure since that time, also a short duration Prior to medication change would have about 1 short seizure per week When transitioned medications, had 5 seizures in one day, then started new medication, only one since  3. Constipation: Miralax 1 cap daily for constipation  4. Recurrent skin abscesses: Coconut oil for skin has been helping with acne, though it does not smell very good No recent skin abscesses  5. Hypertension: Bilateral foot swelling over the past 1 month BP in office today is 144/90, has been in low 120's over low 70's on current dosing of Amlodipine for past several visits  6. Poly-pharmacy: No interaction between Ibuprofen and any seizure medications Identified interaction between Amlodipine and Oxtellar XR (decrease effectiveness of amlodipine through enzyme activity)  7. Autism Spectrum Disorder, global developmental delays

## 2013-01-21 NOTE — Progress Notes (Signed)
Subjective:     Patient ID: Zachary [name], male   DOB: [birth year], 23 y.o.   MRN: [id number]  HPI 1. R knee swelling and pain: About 1 month ago, dislocated R knee (popped out and then back in) Has been vocalizing more when walking, unable to do normal activities on leg Mother has noticed persistent swelling in the affected knee Refuses to wear knee brace, going for Ortho follow-up on 01/27/2013 May have MRI on knee after follow-up  2. Seizures:  Medication changes about 1 month ago, discontinued Trileptal, started an extended release form (Oxtellar XR; 3 600-mg tablets at bedtime) Only one seizure since that time, also a short duration Prior to medication change would have about 1 short seizure per week When transitioned medications, had 5 seizures in one day, then started new medication, only one since  3. Constipation: Miralax 1 cap daily for constipation  4. Recurrent skin abscesses: Coconut oil for skin has been helping with acne, though it does not smell very good No recent skin abscesses  5. Hypertension: Bilateral foot swelling over the past 1 month BP in office today is 144/90, has been in low 120's over low 70's on current dosing of Amlodipine for past several visits  6. Poly-pharmacy: No interaction between Ibuprofen and any seizure medications Identified interaction between Amlodipine and Oxtellar XR (decrease effectiveness of amlodipine through enzyme activity) Reviewed medications in detail and updated Medications section of this cahrt  7. Autism Spectrum Disorder, global developmental delays  Review of Systems R knee swelling and pain Bilateral foot edema, non-pitting, non-tender Otherwise, patient at baseline    Objective:   Physical Exam General: well developed, well nourished young man, seated in wheelchair in exam room.  NAD, though does occasionally vocalize and seems to act as though he is over-stimulated. Head: head normocephalic and atraumatic.  Ears,  Nose and Throat: oropharynx benign. Neck: supple, normal ROM Respiratory: lung clear to auscultation bilaterally Cardiovascular: regular rate and rhythm, no murmurs; he has edema in both feet (non-pitting, able to palpate faint pulse in both feet) Skin: facial acne spread diffusely across entire face, lesions appear superficial  Neurologic Exam  Mental Status: Awake and alert. He makes only fleeting eye contact. He resists some efforts at examination. He is unable to follow commands. He has very limited language.  Cranial Nerves: Pupils equal, briskly reactive to light. Extraocular movements full without nystagmus. Hearing appears intact as he turns to localize sounds. Motor: Normal bulk with increased tone in all extremities, wheelchair for transport . Clumsy fine motor movements. 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 Sensory: Intact to withdrawal x4.  Coordination: Poor quality fine motor movements.  Reflexes: Diminished and symmetric. Toes downgoing. He had no clonus today.  R knee: Moderate swelling with mild warmth to touch, able to move knee through ROM without response.  L knee is normal.    Assessment:     22 year old AAM with severe Autism Spectrum Disorder and otherwise very complicated and complex medical history.  Is currently stable, though has outstanding issues of R knee swelling and possible injury, exacerbation of HTN possibly secondary to medication interaction with new seizure medication.  Is stable and at baseline in other respects.    Plan:     1. R knee: Zachary [name] will follow with the Orthopedic next Tuesday 8/26 for re-evaluation of knee and possible imaging. 2. HTN: No  medication changes at this time.  Advised mother to take 2-3 BP measurements per day at home and address knee issue.  Two possible explanations seem most likely, pain response due to R knee injury and  medication interaction with Oxtellar reducing effectiveness of amlodipine.  If necessary will have to add a second agent, most likely HCTZ.  Also, Zachary [name] has Nephrology follow-up in near future. 3. Reviewed medications in detail and updated lists. 4. Complete forms necessary for Zachary [name] to continue in After Zachary [name].     Total time = 45 minutes, > 50 % face to face

## 2013-01-22 ENCOUNTER — Telehealth: Payer: Self-pay | Admitting: Pediatrics

## 2013-01-22 ENCOUNTER — Other Ambulatory Visit: Payer: Self-pay | Admitting: Pediatrics

## 2013-01-22 MED ORDER — HYDROCHLOROTHIAZIDE 12.5 MG PO CAPS: 12.5000 mg | ORAL_CAPSULE | ORAL | Status: DC

## 2013-01-22 NOTE — Telephone Encounter (Signed)
BP still elevated on Amlodipine 10 mg total per day Possible interaction between Oxtellar XR and Amlodipine may reduce effectiveness At this time don't want to change too many things at once, will choose HCTZ as second agent HCTZ chosen secondary to fluid in feet and per BP guidelines If maximum HCTZ (25 mg) does not achieve goal BP, then may have to change from Amlodipine Will start, however, with HCTZ 12.5 mg, assess tolerance of this new medication ,follow BP, and then increase if appropriate.

## 2013-01-22 NOTE — Telephone Encounter (Signed)
Mom was here picking up Zachary [name]'s forms and she ask [place] to let you know Zachary [name] is taking 5mg  Amlodipine (not 10mg  per mom) BID.  She said Zachary [name]'s BP is still high (144/110) today and has been every day. He was acting dizzy this morning.

## 2013-02-25 ENCOUNTER — Telehealth: Payer: Self-pay | Admitting: *Deleted

## 2013-02-25 NOTE — Telephone Encounter (Signed)
Noted, will see patient in the morning.

## 2013-02-25 NOTE — Telephone Encounter (Signed)
[name] the patient's mom called and stated that the patient had 2 seizures on yesterday, the first seizure lasted 2 and a half minutes and the second one lasted 3 minutes, [place] started acting weird on Monday the day before the seizure activity, she states that Vibhav has not had a seizure since Dr. Sharene Skeans placed him on Oxtellar ER, the family is planning on a beach trip leaving tomorrow mom wanted the patient seen today, I informed her that Dr. Sharene Skeans was out of the office this morning and his schedule was full for the afternoon but I had an opening tomorrow at 12:00 pm with an arrival time of 11:45 am, she confirmed that appointment. Archie Patten can be reached at [phone].      Thanks,  Belenda Cruise.

## 2013-02-26 ENCOUNTER — Encounter: Payer: Self-pay | Admitting: Pediatrics

## 2013-02-26 ENCOUNTER — Ambulatory Visit (INDEPENDENT_AMBULATORY_CARE_PROVIDER_SITE_OTHER): Payer: Medicaid Other | Admitting: Pediatrics

## 2013-02-26 VITALS — BP 96/66 | HR 80 | Ht 61.0 in | Wt 136.0 lb

## 2013-02-26 DIAGNOSIS — G40319 Generalized idiopathic epilepsy and epileptic syndromes, intractable, without status epilepticus: Secondary | ICD-10-CM

## 2013-02-26 DIAGNOSIS — R269 Unspecified abnormalities of gait and mobility: Secondary | ICD-10-CM

## 2013-02-26 DIAGNOSIS — G808 Other cerebral palsy: Secondary | ICD-10-CM

## 2013-02-26 DIAGNOSIS — F71 Moderate intellectual disabilities: Secondary | ICD-10-CM

## 2013-02-26 DIAGNOSIS — F848 Other pervasive developmental disorders: Secondary | ICD-10-CM

## 2013-02-26 MED ORDER — OXTELLAR XR 600 MG PO TB24: 1800.0000 mg | ORAL_TABLET | Freq: Every day | ORAL | Status: DC

## 2013-02-26 NOTE — Progress Notes (Signed)
Patient: Zachary [name] MRN: [id number] Sex: male DOB: 09/11/1990  Provider: Deetta Perla, MD Location of Care: [facility]  Note type: Urgent return visit  History of Present Illness: Referral Source: Dr. Ferman Hamming History from: mother and [facility] chart Chief Complaint: Seizure Activity  Zachary [name] is a 22 y.o. 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  Zachary patient returns February 26, 2013 for Zachary first time since December 01, 2012.  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  He returns today on an urgent basis because for Zachary first time since we placed him on Oxteller, he had a pair of seizures on February 24, 2013.  Zachary first lasted for 2-1/2 minutes, Zachary second lasted for 3 minutes.  He had some restless behavior.  His mother said he was acting "weird."  She had trouble describing that.  Seizures were associated with twitching of his right face and shoulder.  This began on Zachary way to school.  This then evolved into a generalized tonic-clonic seizure.  He slept for most of Zachary day.  Around 4:30, he had a second seizure that was generalized.  His mother activated his vagal nerve stimulator when he developed facial twitching with Zachary generalized seizure, and again with a second generalized seizure.  This did not seem to abort Zachary behavior.  On occasion his mother is certain that it aborts his events.   He has taken and tolerated this medicine well.  He is on polypharmacy and has a vagal nerve stimulator.  I interrogated Zachary stimulator today and this will be described below.  Procedure: interrogation of vagal nerve stimulator  Zachary implanted device is series 102, serial [id number], implanted May 23, 2011.  On interrogation: amplitude 2.50 mA, frequency 20 Hz, pulse width 250 s, stimulation duration 7 seconds, stimulation interval  0.5 minutes, magnet amplitude 2.50 mA, magnet stimulation duration 30 seconds, magnet pulse width 250 s.   Number of stimuli since last visit is 59; total number since implantation: 406.  Normal diagnostics with an amplitude of 2.50 milliamps again showed intact communication, output, and impedance. DC/ DC conversion code was 3. There is no indication to change Zachary battery.  Review of Systems: 12 system review was remarkable for itching, short of breath, cough, wheezing, snoring and seizure  Past Medical History  Diagnosis Date  . Seizures   . MR (mental retardation)   . CP (cerebral palsy)   . Hypertension   . Fracture of femur, intertrochanteric, closed    Hospitalizations: no, Head Injury: no, Nervous System Infections: no, Immunizations up to date: yes Past Medical History Comments:  I began to see him on July 10, 2004. He apparently was seen by me when he was eight months of age with developmental delay. Seizure activity; however, did not began until he is 14, two days after starting amitriptyline. 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 His most recent CT scan of Zachary brain was on September 29, 2009, and showed no significant changes.  EEG showed diffuse background slowing.   He was treated with Depakote, Topamax, and Lyrica, but suffered side effects and agitation from those medications. He has taken Trileptal, Keppra, and Vimpat for quite some time. He was seen at [facility] on Oct 27, 2010 for 6 days.for a phase 1 evaluation. MRI of Zachary brain showed ex-vacuo dilatation of his ventricles and cortical atrophy. Volume loss most pronounced in Zachary parietal lobes bilaterally, temporal lobes with widening of Zachary sylvian fissures and a paucity of white matter diffusely most pronounced again in Zachary  parietal lobes with patchy T2 hyperintensity. 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 There was no evidence of mesial temporal sclerosis.  Magnetoencephalogram showed epileptiform activity arising from Zachary left posterior temple region with a field extending to Zachary occipital lobe. This was not seen with concurrent routine EEG.   PET showed decreased accumulation of fluorodeoxyglucose in bilateral parietal and temporal lobes in comparison with Zachary remainder of Zachary cerebral hemispheres. This was somewhat worse in Zachary right than Zachary left bilateral temporal cortex and temporal pole suggesting a longstanding insult.   Prolonged video EEG showed onset of bifrontal slowing that evolved into rhythmic activity with spikes of greater amplitude over Zachary right central region with secondary generalization. Clinically, Zachary patient has onset of a myoclonic jerk with his head turned to Zachary left, movement of Zachary left arm and leg, then tonic posturing followed by tonic-clonic generalized movements.   One day later Zachary patient had bifrontal rhythmic beta range activity followed by decrement in voltage with fast beta range activity for about 30 seconds and then generalized rhythmic beta range activity. Zachary patient had a slight head bob slowly lowered to Zachary bed with stiffening of both upper extremities and tonic-clonic movement of both upper extremities followed by secondary generalization in his legs. A total of seven seizures were monitored. Zachary last three were obscured by electrode artifact. Seizure onset appeared to occur from both frontal regions. This led to Zachary recommendations to place Zachary vagal nerve stimulator.   He has frequent ear infections, urinary tract infections, constipation and a hip fracture. He has had problems with neutropenia and thrombocytopenia. He had some vomiting in June, 2010 and developed aspiration pneumonia. He tends to have a temperature of 99 in Zachary late evening. He has had  several episodes in which his left knee has been dislocated. This happened as recently as August 28, 2009. He sat down in a chair and his knee went out of place. EMS was called and he was taken to Zachary ER. While waiting for Zachary ER physician, Jihad stretched and his knee went back in to place. His mother said that he was subsequently seen by his orthopedist, who thinks that he may eventually need surgery on Zachary knee.   Behavior History none  Surgical History Past Surgical History  Procedure Laterality Date  . Hip surgery    . Foot surgery    . Implantation vagal nerve stimulator    . Tympanostomy tube placement  2009  . Tonsillectomy  2009  . Pediatomy  2009   Family History family history includes Cancer in his paternal grandmother; Diabetes in his paternal grandfather. Family History is negative migraines, seizures, cognitive impairment, blindness, deafness, birth defects, chromosomal disorder, autism.  Social History History   Social History  . Marital Status: Single    Spouse Name: N/A    Number of Children: N/A  . Years of Education: N/A   Social History Main Topics  . Smoking status: Never Smoker   . Smokeless tobacco: Never Used  . Alcohol Use: No  . Drug Use: No  . Sexual Activity: No   Other Topics Concern  .  None   Social History Narrative  . None   Educational level special education School Attending: [employer]. Living with mother, brother and sister  Current Outpatient Prescriptions on File Prior to Visit  Medication Sig Dispense Refill  . amLODipine (NORVASC) 10 MG tablet TAKE ONE TABLET TWICE DAILY  60 tablet  3  . cetirizine (ZYRTEC) 10 MG tablet Take 10 mg by mouth daily as needed for allergies.      . diazepam (DIASTAT) 2.5 MG GEL Place 2.5 mg rectally as needed. For when patient has a seizure that last over 7 minutes      . fluocinolone (DERMA-SMOOTHE/FS BODY) 0.01 % external oil Apply topically 2 (two) times daily.  120 mL  1  .  fluocinolone (DERMA-SMOOTHE/FS SCALP) 0.01 % external oil Apply topically 2 (two) times daily.  120 mL  1  . fluticasone (FLONASE) 50 MCG/ACT nasal spray Place 1 spray into Zachary nose daily.  16 g  2  . hydrochlorothiazide (MICROZIDE) 12.5 MG capsule Take 1 capsule (12.5 mg total) by mouth every morning.  40 capsule  0  . KEPPRA 1000 MG tablet Take 1&1/2 tabs twice daily  93 tablet  5  . ondansetron (ZOFRAN ODT) 8 MG disintegrating tablet Take 1 tablet (8 mg total) by mouth every 8 (eight) hours as needed for nausea.  10 tablet  0  . OXcarbazepine ER (OXTELLAR XR) 600 MG TB24 Take 1,800 mg by mouth at bedtime.  90 tablet  1  . polyethylene glycol (MIRALAX / GLYCOLAX) packet Take 17 g by mouth daily. Can increase to 4 capfuls a day if needed      . VIMPAT 100 MG TABS Take 1 tab by mouth in Zachary morning and 1 tab at 3pm  60 tablet  5  . VIMPAT 200 MG TABS Take 2 tabs by mouth at bedtime  60 tablet  5   No current facility-administered medications on file prior to visit.   Zachary medication list was reviewed and reconciled. All changes or newly prescribed medications were explained.  A complete medication list was provided to Zachary patient/caregiver.  Allergies  Allergen Reactions  . Depakote [Divalproex Sodium] Other (See Comments)    Low Platelets  . Lyrica [Pregabalin] Other (See Comments)    Sleepiness  . Penicillins   . Topamax [Topiramate] Other (See Comments)    Weight Loss    Physical Exam BP 96/66  Pulse 80  Ht 5\' 1"  (1.549 m)  Wt 136 lb (61.689 kg)  BMI 25.71 kg/m2  General: well developed, well nourished young man, seated in wheelchair in exam room.  Head: head normocephalic and atraumatic.  Ears, Nose and Throat: oropharynx benign. 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  Neck: supple with no carotid or supraclavicular bruits.  Respiratory: lung clear to auscultation.  Cardiovascular: regular rate and rhythm, no murmurs; he has edema in both feet  Skin:  facial acne. He has well healed surgical scars on his left neck and his left anterior chest from Zachary VNS  Trunk: truncal obesity   Neurologic Exam   Mental Status: Awake and alert. He does not make eye contact. He has evidence of cognitive impairment. He resists some efforts at examination. He is unable to follow commands. He has very limited language.  Cranial Nerves: Fundoscopic exam reveals poorly visualized disc margins. Pupils equal, briskly reactive to light. Extraocular movements full without nystagmus. disconjugate gaze with lazy eye on Zachary right. He does not regard objects  in Zachary left visual field as well as Zachary right, he has difficulties judging distance when coordinating movements. Hearing appears intact as he turns to localize sounds. Neck flexion and extension normal after VNS was implanted .  Motor: Normal bulk with increased tone in all extremities. decreased strength with formal strength testing in all extremities. wheelchair for transport . Clumsy fine motor movements. Left arm spastic hemiparesis with flexion contracture at Zachary elbow and a claw hand deformity.  Sensory: Intact to withdrawal x4.  Coordination: Poor quality fine motor movements.  Gait and Station: Arises from chair without difficulty. Stance is narrow based. Gait demonstrates diplegic gait that is slow with shortened stride length. Varus position of his feet . He walked fairly well today without any assistance.  Reflexes: Diminished and symmetric. Toes downgoing. He had no clonus today.  Assessment 1. 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  There is a focal quality to his seizures, which often involves facial twitching as what may be Zachary complex partial seizure or simple partial seizure. 2. 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 3. 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 5. 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  Plan We will obtain a morning trough oxcarbazepine level next  week.  Zachary family is getting ready to go to [place] beach.  I interrogated his vagal nerve stimulator and made a modest change, Zachary stimulus duration had been left at 30 seconds on rather than 7 seconds as it should have been.  I restored that to Zachary proper setting.  This just tends to wear Zachary battery down more quickly without providing better seizure control.  Taft continues to have paroxysmal seizures.  I am not sure that it has much to do with his medications or his vagal nerve stimulator, however, it is reasonable to see if Oxtellar can be increased.  Zachary patient is on brand name medical drugs for Vimpat, Oxtellar, and Keppra.  He experiences marked increase in seizure frequency when he is switched to Zachary generic.  I spent 30 minutes of face-to-face time with Zachary patient and his mother and interrogated and reprogrammed his vagal nerve stimulator.  He will return in three months.  I will see him sooner depending upon clinical need.  I will follow up with Zachary family by phone after Zachary oxcarbazepine level is available.  Deetta Perla MD

## 2013-03-05 ENCOUNTER — Telehealth: Payer: Self-pay | Admitting: *Deleted

## 2013-03-05 ENCOUNTER — Encounter (HOSPITAL_COMMUNITY): Payer: Self-pay | Admitting: Nurse Practitioner

## 2013-03-05 ENCOUNTER — Emergency Department (HOSPITAL_COMMUNITY)
Admission: EM | Admit: 2013-03-05 | Discharge: 2013-03-05 | Disposition: A | Discharge status: Home / Self Care | Payer: Medicaid Other | Attending: Emergency Medicine | Admitting: Emergency Medicine

## 2013-03-05 ENCOUNTER — Emergency Department (HOSPITAL_COMMUNITY): Payer: Medicaid Other

## 2013-03-05 DIAGNOSIS — Z880 Allergy status to penicillin: Secondary | ICD-10-CM | POA: Insufficient documentation

## 2013-03-05 DIAGNOSIS — I10 Essential (primary) hypertension: Secondary | ICD-10-CM | POA: Insufficient documentation

## 2013-03-05 DIAGNOSIS — Z8659 Personal history of other mental and behavioral disorders: Secondary | ICD-10-CM | POA: Insufficient documentation

## 2013-03-05 DIAGNOSIS — G40909 Epilepsy, unspecified, not intractable, without status epilepticus: Secondary | ICD-10-CM | POA: Insufficient documentation

## 2013-03-05 DIAGNOSIS — Z8781 Personal history of (healed) traumatic fracture: Secondary | ICD-10-CM | POA: Insufficient documentation

## 2013-03-05 DIAGNOSIS — R569 Unspecified convulsions: Secondary | ICD-10-CM

## 2013-03-05 DIAGNOSIS — Z79899 Other long term (current) drug therapy: Secondary | ICD-10-CM | POA: Insufficient documentation

## 2013-03-05 DIAGNOSIS — K5900 Constipation, unspecified: Secondary | ICD-10-CM | POA: Insufficient documentation

## 2013-03-05 LAB — COMPREHENSIVE METABOLIC PANEL
ALT: 24 U/L (ref 0–53)
AST: 18 U/L (ref 0–37)
Calcium: 8.7 mg/dL (ref 8.4–10.5)
Creatinine, Ser: 0.72 mg/dL (ref 0.50–1.35)
GFR calc Af Amer: >90 mL/min (ref >90)
Sodium: 136 mEq/L (ref 135–145)
Total Protein: 6.4 g/dL (ref 6.0–8.3)

## 2013-03-05 LAB — CBC WITH DIFFERENTIAL/PLATELET
Basophils Absolute: 0 10*3/uL (ref 0.0–0.1)
Eosinophils Absolute: 0 10*3/uL (ref 0.0–0.7)
Eosinophils Relative: 1 % (ref 0–5)
MCH: 28.6 pg (ref 26.0–34.0)
MCHC: 35 g/dL (ref 30.0–36.0)
MCV: 81.8 fL (ref 78.0–100.0)
Monocytes Absolute: 0.7 10*3/uL (ref 0.1–1.0)
Platelets: 145 10*3/uL — ABNORMAL LOW (ref 150–400)
RDW: 12.5 % (ref 11.5–15.5)

## 2013-03-05 MED ORDER — MAGNESIUM CITRATE PO SOLN: 1.0000 | Freq: Once | ORAL | Status: DC

## 2013-03-05 NOTE — ED Notes (Addendum)
Per mother pt has seizure activity this morning. Mother staying with patient at bedside. Dr. Fayrene Fearing at bedside.

## 2013-03-05 NOTE — ED Notes (Signed)
Per EMS pt has hx of MR and seizures [place] [employer] [place] pt had full body seizure when EMS came to scene pt had head tics present. EMS gave 2.5mg  of midazolam IV. Pt became lethargic.

## 2013-03-05 NOTE — Telephone Encounter (Signed)
[name] the patient's mom called and stated that the bus has called her and she's in route, Zachary [name] is having a seizure on the bus and they have called EMT she wants to know what she should do, she stated she told them not to take him to wait for her arrival, she also wants to know about his lab results and what the stats are, mom can be reached at [phone]. MB

## 2013-03-05 NOTE — ED Provider Notes (Signed)
CSN: [id number]     Arrival date & time 03/05/13  1655 History   First MD Initiated Contact with Patient 03/05/13 1659     Chief Complaint  Patient presents with  . Seizures    HPI   Child was on the bus today. Witnessed seizure. He was brought here. His mother arrives here. He has seizure, swelling as well. His long history of seizure disorder and is on multiple medications. Isn't with him at, Keppra, and most recently oxtellar.  He had good control for several months. Over the last 2 months he has had about one seizure per week. Tolerating medicines okay. Recently started on blood pressure medication. Mom gives somewhat of a confusing story that he was put on blood pressure medicines "because of his knee". It sounds like his physician stopped his knee was painful and his blood pressure was persistently high. However as his knee improved his blood pressure did not. He is on Norvasc and hydrochlorothiazide for the last several weeks. Mom describes increasing difficulty with constipation. She performed a disimpaction on him this morning. Past Medical History  Diagnosis Date  . Seizures   . MR (mental retardation)   . CP (cerebral palsy)   . Hypertension   . Fracture of femur, intertrochanteric, closed    Past Surgical History  Procedure Laterality Date  . Hip surgery    . Foot surgery    . Implantation vagal nerve stimulator    . Tympanostomy tube placement  2009  . Tonsillectomy  2009  . Pediatomy  2009   Family History  Problem Relation Age of Onset  . Cancer Paternal Grandmother     Died in her 78's  . Diabetes Paternal Grandfather     Died in his 31's    History  Substance Use Topics  . Smoking status: Never Smoker   . Smokeless tobacco: Never Used  . Alcohol Use: No    Review of Systems   Review of systems unobtainable. Level V Caveat review of systems due to patient's mental retardation.  Allergies  Depakote; Lyrica; Penicillins; and Topamax  Home Medications    Current Outpatient Rx  Name  Route  Sig  Dispense  Refill  . amLODipine (NORVASC) 5 MG tablet   Oral   Take 5 mg by mouth 2 (two) times daily.         . cetirizine (ZYRTEC) 10 MG tablet   Oral   Take 10 mg by mouth daily as needed for allergies.         . diazepam (DIASTAT) 2.5 MG GEL   Rectal   Place 2.5 mg rectally as needed. For when patient has a seizure that last over 7 minutes         . fluocinolone (DERMA-SMOOTHE/FS BODY) 0.01 % external oil   Topical   Apply topically 2 (two) times daily.   120 mL   1   . fluocinolone (DERMA-SMOOTHE/FS SCALP) 0.01 % external oil   Topical   Apply topically 2 (two) times daily.   120 mL   1   . hydrochlorothiazide (MICROZIDE) 12.5 MG capsule   Oral   Take 1 capsule (12.5 mg total) by mouth every morning.   40 capsule   0   . Lacosamide (VIMPAT) 100 MG TABS   Oral   Take 100 mg by mouth 2 (two) times daily. Take 1 tab by mouth in the morning and 1 tab at 3pm         .  lacosamide (VIMPAT) 200 MG TABS tablet   Oral   Take 400 mg by mouth at bedtime. Take 2 tabs by mouth at bedtime         . levETIRAcetam (KEPPRA) 1000 MG tablet   Oral   Take 1,500 mg by mouth 2 (two) times daily.         . ondansetron (ZOFRAN ODT) 8 MG disintegrating tablet   Oral   Take 1 tablet (8 mg total) by mouth every 8 (eight) hours as needed for nausea.   10 tablet   0   . OXTELLAR XR 600 MG TB24   Oral   Take 1,800 mg by mouth at bedtime.   93 tablet   5     Dispense as written.   . polyethylene glycol (MIRALAX / GLYCOLAX) packet   Oral   Take 17 g by mouth daily. Can increase to 4 capfuls a day if needed         . magnesium citrate SOLN   Oral   Take 296 mLs (1 Bottle total) by mouth once.   195 mL   10    BP 112/80  Pulse 69  Temp(Src) 98 F (36.7 C) (Oral)  Resp 16  SpO2 100% Physical Exam  Constitutional: He appears well-developed and well-nourished. No distress.  HENT:  Head: Normocephalic.  Eyes:  Conjunctivae are normal. Pupils are equal, round, and reactive to light. No scleral icterus.  Neck: Normal range of motion. Neck supple. No thyromegaly present.  Cardiovascular: Normal rate and regular rhythm.  Exam reveals no gallop and no friction rub.   No murmur heard. Pulmonary/Chest: Effort normal and breath sounds normal. No respiratory distress. He has no wheezes. He has no rales.  Abdominal: Soft. Bowel sounds are normal. He exhibits no distension. There is no tenderness. There is no rebound.  Rectal shows no firm stool.  Musculoskeletal: Normal range of motion.  Neurological:  Initially postictal period he was a bit somnolent from versed.  His level of consciousness returned to normal during his department stay  Skin: Skin is warm and dry. No rash noted.    ED Course  Procedures (including critical care time) Labs Review Labs Reviewed  CBC WITH DIFFERENTIAL - Abnormal; Notable for the following:    HCT 38.6 (*)    Platelets 145 (*)    Neutrophils Relative % 79 (*)    All other components within normal limits  COMPREHENSIVE METABOLIC PANEL - Abnormal; Notable for the following:    Total Bilirubin 0.2 (*)    All other components within normal limits   Imaging Review Dg Abd 1 View  03/05/2013   *RADIOLOGY REPORT*  Clinical Data: Seizures, distended abdomen for 10 days  ABDOMEN - 1 VIEW  Comparison: None.  Findings: Nonobstructive bowel gas pattern.  Fecal retention throughout the ascending and transverse colon.  No abnormally dilated loops of bowel.  Gas seen into the rectum.  IMPRESSION: No significant abnormalities   Original Report Authenticated By: Esperanza Heir, M.D.    MDM   1. Constipation   2. Seizure    Labs are normal. Large volume of right-sided and first colonic stool. Plan;  started taking twice a day Colace and 3 times a day MiraLax. He may citrate. GI/primary care followup.    Roney Marion, MD 03/05/13 [phone]

## 2013-03-05 NOTE — Telephone Encounter (Signed)
I was only able to reach voicemail.  It appears that the patient is in hospital emergency room.  I will be happy to talk to mom tomorrow.  The oxcarbazepine level has not returned.  We will investigate tomorrow.

## 2013-03-06 ENCOUNTER — Telehealth: Payer: Self-pay | Admitting: Pediatrics

## 2013-03-06 NOTE — Telephone Encounter (Signed)
Returned call to mother  To check in on Zachary [name].  Has had cramping and abdominal pain after second dose of Mag Citrate, no stool as yet.  Had been working up to 2.5 capfuls of Miralax once per day, plus 2 stool softeners, plus an enema once per week; but has a hard time maintaining soft stools.  Does not sound like he has had a full clean out before.  Recommended Miralax "clean out" and then resumption of maintenance therapy.  Mother agreed and will follow-up at least by phone tomorrow.

## 2013-03-06 NOTE — Telephone Encounter (Signed)
Jaja went to ER last night and he is impacted was told to stop his BP meds until further notice mom is to monitor over weekend and will call you back on Monday.

## 2013-03-06 NOTE — Telephone Encounter (Signed)
Oxcarbazepine level was 41 mcg/mL which is in the low toxic range .  Unless he is having side effects, I would not make changes.  Presently, I think that there is no reason make changes in his medication.  I believe the patient will have seizures from time to time and that further response at this time probably is not in his best interest when he has some other health issues that could have affected seizure threshold in some way.

## 2013-03-06 NOTE — Telephone Encounter (Signed)
Child has had two doses of magnesium citrate and has still not gone to the bathroom,Mother states child is in a lot of pain.

## 2013-03-06 NOTE — Telephone Encounter (Signed)
Mom Zachary [name] called to say that she was sorry that she missed your call yesterday. Zachary [name] was  in ER because seizure lasted over 7 minutes on school bus. At ER they found that he was impacted and recommended magnesium citrate. Mom says that hasn't worked yet. His BP was also very low and that was stopped so Mom has called his PCP to talk about that. Mom wants to talk with you about the seizure. Her number is [phone]. TG

## 2013-03-09 NOTE — Telephone Encounter (Signed)
I spoke with Zachary [name] the patient's mom informing her of Dr. Darl Householder findings of the patient's lab work and his message about the patient's seizures, mom confirmed understanding of our phone conversation and stated she would call if there was any change in his situation, she did say that he hasn't had any seizures since Friday and she feels the seizures were brought on by [name] being impacted and on several different medications for that condition however he is fine and back to his normal self at this time. MB

## 2013-03-09 NOTE — Telephone Encounter (Signed)
I reviewed the notes.  There is no reason make changes now.  He did not return phone call because it appears that mother is satisfied with our response.

## 2013-03-20 ENCOUNTER — Other Ambulatory Visit: Payer: Self-pay | Admitting: Pediatrics

## 2013-03-20 MED ORDER — HYDROCHLOROTHIAZIDE 12.5 MG PO CAPS: 12.5000 mg | ORAL_CAPSULE | ORAL | Status: DC

## 2013-04-02 ENCOUNTER — Ambulatory Visit (INDEPENDENT_AMBULATORY_CARE_PROVIDER_SITE_OTHER): Payer: Medicaid Other | Admitting: Pediatrics

## 2013-04-02 DIAGNOSIS — Z23 Encounter for immunization: Secondary | ICD-10-CM

## 2013-04-03 NOTE — Progress Notes (Signed)
Presented today for flu vaccine. No new questions on vaccine. No contraindications to administration.  Parent was counseled on risks benefits of vaccine and parent verbalized understanding.  Handout (VIS) given for flu vaccine.  

## 2013-04-07 ENCOUNTER — Telehealth: Payer: Self-pay | Admitting: Pediatrics

## 2013-04-07 NOTE — Telephone Encounter (Signed)
Firefighter form on your desk to fill out ASAP

## 2013-04-08 NOTE — Telephone Encounter (Signed)
Form filled

## 2013-04-23 ENCOUNTER — Other Ambulatory Visit: Payer: Self-pay | Admitting: Pediatrics

## 2013-04-27 ENCOUNTER — Ambulatory Visit (INDEPENDENT_AMBULATORY_CARE_PROVIDER_SITE_OTHER): Payer: Medicaid Other | Admitting: Pediatrics

## 2013-04-27 ENCOUNTER — Encounter: Payer: Self-pay | Admitting: Pediatrics

## 2013-04-27 VITALS — Wt 124.0 lb

## 2013-04-27 DIAGNOSIS — J029 Acute pharyngitis, unspecified: Secondary | ICD-10-CM

## 2013-04-27 DIAGNOSIS — L819 Disorder of pigmentation, unspecified: Secondary | ICD-10-CM

## 2013-04-27 DIAGNOSIS — J069 Acute upper respiratory infection, unspecified: Secondary | ICD-10-CM

## 2013-04-27 MED ORDER — FLUTICASONE PROPIONATE 50 MCG/ACT NA SUSP: 1.0000 | Freq: Every day | NASAL | Status: DC

## 2013-04-27 NOTE — Progress Notes (Signed)
Subjective:    Patient ID: Zachary [name], male   DOB: Sep 29, 1990, 22 y.o.   MRN: [id number]  HPI: Here with mom. Not eating well the past 2 days. Nasal congestion with snoring at night. Dry cough. No fever. No V or D. Had seizure yesterday lasting less than 5 minutes. No meds administered. Mom concerned about ear ache.  Other concern: skin itches a lot. Has been using dermasmoothe daily on skin, including face. Face is becoming darker.  Pertinent PMHx: Seizures, MR, CP, chronic constipation, Hx of recurrent OM and loud snoring -- s/p 3 sets of tubes and T and A. Only snores now when he gets sick and is congested. Meds: List reviewed and updated Drug Allergies: Depakote, lyrica, topamate, penicillins Immunizations: Had flu vaccine, needs immunizations abstracted (just had PE in August) Fam Hx: mom has had a cold with a dry cough  ROS: Negative except for specified in HPI and PMHx  Objective:  Weight 124 lb (56.246 kg). GEN: Alert, nonverbal, in NAD. Occ dry cough. HEENT:     Head: normocephalic    TMs: tube visible on left, TMs clear    Nose: mild to mod nasal congestion   Throat: red with lymphoid hyperplasia    Eyes:  no periorbital swelling, no conjunctival injection or discharge NECK: supple, no masses NODES: neg CHEST: symmetrical LUNGS: clear to aus, BS equal  COR: No murmur, RRR ABD: soft, nontender, nondistended, no HSM, no masses MS: no muscle tenderness, no jt swelling,redness or warmth SKIN: well perfused, hyperpigmentation of skin on cheeks  Rapid strep NEG  No results found. No results found for this or any previous visit (from the past 240 hour(s)). @RESULTS @ Assessment:  Viral URI, R/O strep Hyperpigmentation of face -- ? Topical steroid induced Plan:  Reviewed findings and explained expected course. Fluids Flonase for nasal congestion for 2 weeks Ibuprofen D/C dermasmoothe on face -- use dove soap and plain eucerin cream for hydration Steroid in  dermasmoothe may be causing facial hyperpigmentation  TC pending Abstract immunizations

## 2013-04-28 ENCOUNTER — Encounter: Payer: Self-pay | Admitting: Pediatrics

## 2013-04-28 DIAGNOSIS — Z0000 Encounter for general adult medical examination without abnormal findings: Secondary | ICD-10-CM | POA: Insufficient documentation

## 2013-04-29 LAB — CULTURE, GROUP A STREP: Organism ID, Bacteria: NORMAL

## 2013-05-04 ENCOUNTER — Ambulatory Visit (INDEPENDENT_AMBULATORY_CARE_PROVIDER_SITE_OTHER): Payer: Medicaid Other | Admitting: Pediatrics

## 2013-05-04 ENCOUNTER — Encounter: Payer: Self-pay | Admitting: Pediatrics

## 2013-05-04 ENCOUNTER — Telehealth: Payer: Self-pay | Admitting: *Deleted

## 2013-05-04 VITALS — BP 100/70 | HR 84 | Ht 61.0 in | Wt 144.8 lb

## 2013-05-04 DIAGNOSIS — F848 Other pervasive developmental disorders: Secondary | ICD-10-CM

## 2013-05-04 DIAGNOSIS — G40319 Generalized idiopathic epilepsy and epileptic syndromes, intractable, without status epilepticus: Secondary | ICD-10-CM

## 2013-05-04 DIAGNOSIS — R269 Unspecified abnormalities of gait and mobility: Secondary | ICD-10-CM

## 2013-05-04 DIAGNOSIS — F71 Moderate intellectual disabilities: Secondary | ICD-10-CM

## 2013-05-04 DIAGNOSIS — G808 Other cerebral palsy: Secondary | ICD-10-CM

## 2013-05-04 DIAGNOSIS — Z79899 Other long term (current) drug therapy: Secondary | ICD-10-CM

## 2013-05-04 NOTE — Telephone Encounter (Signed)
Zachary [name] the patient's mom called and stated that the patient is having increase in seizure activity, Mon. Nov. 17 he woke up from taking a nap and had a seizure that lasted 1 minute, Monday Nov. 23 the patient was standing in his den at home when he had a 2 minute grand mal seizure causing him to fall on the tile floor in the den and Sat. Nov. 29 at around 5:30 pm he woke up from a nap and went into seizure activity lasting 3 minutes, she states she swiped him and this did not stop or slow the seizure down, when it did finally stop he went back to sleep and 30 minutes later he woke up and began to have another seizure lasting 1 to 2 minutes this was a grand mal, he had a very large loose bowel movement during this seizure, mom swiped [name] during this seizure and it seemed to lessen the intensity of the seizure this time.   After the seizure ended Zachary [name] went back to sleep. Zachary [name] says that he's been laying around not wanting to do much of anything including not wanting to eat. Mom says that [name] did go to school today however she has informed the school about his increase of seizures and that should he have one while at school to call her and she will pick him up. Zachary [name] was calling to inform Dr. Sharene Skeans of what has been happening to see if he wants [name] to come in to be seen or if he just wants to make adjustments to his medication.Zachary [name] can be reached at [phone], I checked the schedule for this week and there are no available openings at this time.     Thanks,  Belenda Cruise.

## 2013-05-04 NOTE — Progress Notes (Signed)
Patient: Zachary [name] MRN: [id number] Sex: male DOB: 15-Mar-1991  Provider: Deetta Perla, MD Location of Care: [facility]  Note type: Routine return visit  History of Present Illness: Referral Source: Dr. Ferman Hamming History from: mother and [facility] chart Chief Complaint: Increase In Seizure Activity/VNS Re-Check  Zachary [name] is a 22 y.o. 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  The patient returns May 04, 2013, for the first time since February 26, 2013.  He has intractable seizures, quadriparesis with sparing of his right arm, and cognitive impairment.  The patient had a flurry of seizures documented in a phone note from earlier today.  These included seizures on April 20, 2013; April 26, 2013; and two on May 02, 2013.  These were all similar. The longest lasted for 3 minutes, the shortest was one minute.  He had significant postictal drowsiness.  He did not respond to stimulating his vagal nerve stimulator.  Overall his health has been good since I saw him.  There was another study he was sleepy, he seemed at baseline to me.  He is taking and tolerating his antiepileptic drugs that are near optimal.  His vagal nerve stimulator is also near optimal.  At present, I do not want to increase his doses and do not want to switch them.  It remains to be seen whether this flurry of seizures is a trend or is unusual.  I spent 30 minutes of face-to-face time with the patient and his mother more than half of it in consultation.  I also reprogrammed his vagal nerve stimulator, which did not need changing.  I will see him in three months' time sooner depending upon clinical need.   Procedure: interrogation of vagal nerve stimulator   The implanted device is series 102, serial [id number], implanted May 23, 2011.  On interrogation: amplitude 2.50  mA, frequency 20 Hz, pulse width 250 s, stimulation duration 7 seconds, stimulation interval 0.5 minutes, magnet amplitude 2.50 mA, magnet stimulation duration 30 seconds, magnet pulse width 250 s.  Number of stimuli since last visit is 24; total number since implantation: 430.  Normal diagnostics with an amplitude of 2.50 milliamps again showed intact communication, output, and impedance. DC/ DC conversion code was 3. There is no indication to change the battery.  Review of Systems: 12 system review was remarkable for cough, snoring, constipation and seizure   Past Medical History  Diagnosis Date  . Seizures   . MR (mental retardation)   . CP (cerebral palsy)   . Hypertension   . Fracture of femur, intertrochanteric, closed    Hospitalizations: no, Head Injury: no, Nervous System Infections: no, Immunizations up to date: yes Past Medical History Comments:  I began to see him on July 10, 2004. He apparently was seen by me when he was eight months of age with developmental delay. Seizure activity; however, did not began until he is 14, two days after starting amitriptyline. 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 His most recent CT scan of the brain was on September 29, 2009, and showed no significant changes.   EEG showed diffuse background slowing.   He was treated with Depakote, Topamax, and Lyrica, but suffered side effects and agitation from those medications. He has taken Trileptal, Keppra, and  Vimpat for quite some time. He was seen at [facility] on Oct 27, 2010 for 6 days.for a phase 1 evaluation. MRI of the brain showed ex-vacuo dilatation of his ventricles and cortical atrophy. Volume loss most pronounced in the parietal lobes bilaterally, temporal lobes with widening of the sylvian  fissures and a paucity of white matter diffusely most pronounced again in the parietal lobes with patchy T2 hyperintensity. 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 There was no evidence of mesial temporal sclerosis.   Magnetoencephalogram showed epileptiform activity arising from the left posterior temple region with a field extending to the occipital lobe. This was not seen with concurrent routine EEG.   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 This was somewhat worse in the right than the left bilateral temporal cortex and temporal pole suggesting a longstanding insult.   Prolonged video EEG showed onset of bifrontal slowing that evolved into rhythmic activity with spikes of greater amplitude over the right central region with secondary generalization. Clinically, the patient has onset of a myoclonic jerk with his head turned to the left, movement of the left arm and leg, then tonic posturing followed by tonic-clonic generalized movements.   One day later the patient had bifrontal rhythmic beta range activity followed by decrement in voltage with fast beta range activity for about 30 seconds and then generalized rhythmic beta range activity. The patient had a slight head bob slowly lowered to the bed with stiffening of both upper extremities and tonic-clonic movement of both upper extremities followed by secondary generalization in his legs. A total of seven seizures were monitored. The last three were obscured by electrode artifact. Seizure onset appeared to occur from both frontal regions. This led to the recommendations to place the vagal nerve stimulator.   He has frequent ear infections, urinary tract infections, constipation and a hip fracture. He has had problems with neutropenia and thrombocytopenia. He had some vomiting in June, 2010 and developed aspiration  pneumonia. He tends to have a temperature of 99 in the late evening. He has had several episodes in which his left knee has been dislocated. This happened as recently as August 28, 2009. He sat down in a chair and his knee went out of place. EMS was called and he was taken to the ER. While waiting for the ER physician, Dailan stretched and his knee went back in to place. His mother said that he was subsequently seen by his orthopedist, who thinks that he may eventually need surgery on the knee.   Behavior History none  Surgical History Past Surgical History  Procedure Laterality Date  . Hip surgery    . Foot surgery    . Implantation vagal nerve stimulator    . Tympanostomy tube placement  2009  . Tonsillectomy  2009  . Pediatomy  2009  . Fracture surgery      Family History family history includes Cancer in his paternal grandmother; Diabetes in his paternal grandfather. Family History is negative migraines, seizures, cognitive impairment, blindness, deafness, birth defects, chromosomal disorder, autism.  Social History History   Social History  . Marital Status: Single    Spouse Name: N/A    Number of Children: N/A  . Years of Education: N/A   Social History Main Topics  . Smoking status: Never Smoker   . Smokeless tobacco: Never Used  .  Alcohol Use: No  . Drug Use: No  . Sexual Activity: No   Other Topics Concern  . None   Social History Narrative  . None   Educational level special education  Occupation: N/A Living with mother  Hobbies/Interest: Riding in the car and walking  School comments Caiden graduated from [employer] in 2012 he now attends [employer] day program.   Current Outpatient Prescriptions on File Prior to Visit  Medication Sig Dispense Refill  . amLODipine (NORVASC) 5 MG tablet Take 5 mg by mouth 2 (two) times daily.      . cetirizine (ZYRTEC) 10 MG tablet Take 10 mg by mouth daily as needed for allergies.      . diazepam (DIASTAT) 2.5 MG GEL Place  2.5 mg rectally as needed. For when patient has a seizure that last over 7 minutes      . fluocinolone (DERMA-SMOOTHE/FS BODY) 0.01 % external oil Apply topically 2 (two) times daily.  120 mL  1  . Fluocinolone Acetonide (DERMA-SMOOTHE/FS SCALP) 0.01 % OIL APPLY TWICE DAILY  120 mL  0  . fluticasone (FLONASE) 50 MCG/ACT nasal spray Place 1 spray into both nostrils daily.  16 g  1  . hydrochlorothiazide (MICROZIDE) 12.5 MG capsule Take 12.5 mg by mouth as needed (Only gives this once or twice a month if ankles swell. Monitor BP at home).      . Lacosamide (VIMPAT) 100 MG TABS Take 100 mg by mouth 2 (two) times daily. Take 1 tab by mouth in the morning and 1 tab at 3pm      . lacosamide (VIMPAT) 200 MG TABS tablet Take 400 mg by mouth at bedtime. Take 2 tabs by mouth at bedtime      . levETIRAcetam (KEPPRA) 1000 MG tablet Take 1,500 mg by mouth 2 (two) times daily.      . ondansetron (ZOFRAN ODT) 8 MG disintegrating tablet Take 1 tablet (8 mg total) by mouth every 8 (eight) hours as needed for nausea.  10 tablet  0  . OXTELLAR XR 600 MG TB24 Take 1,800 mg by mouth at bedtime.  93 tablet  5  . polyethylene glycol (MIRALAX / GLYCOLAX) packet Take 17 g by mouth daily. Can increase to 4 capfuls a day if needed       No current facility-administered medications on file prior to visit.   The medication list was reviewed and reconciled. All changes or newly prescribed medications were explained.  A complete medication list was provided to the patient/caregiver.  Allergies  Allergen Reactions  . Depakote [Divalproex Sodium] Other (See Comments)    Low Platelets  . Lyrica [Pregabalin] Other (See Comments)    Sleepiness  . Penicillins   . Topamax [Topiramate] Other (See Comments)    Weight Loss   Physical Exam BP 100/70  Pulse 84  Ht 5\' 1"  (1.549 m)  Wt 144 lb 12.8 oz (65.681 kg)  BMI 27.37 kg/m2  General: well developed, well nourished young man, seated in wheelchair in exam room.  Head: head  normocephalic and atraumatic.  Ears, Nose and Throat: oropharynx benign. 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  Neck: supple with no carotid or supraclavicular bruits.  Respiratory: lung clear to auscultation.  Cardiovascular: regular rate and rhythm, no murmurs; he has edema in both feet  Skin: facial acne. He has well healed surgical scars on his left neck and his left anterior chest from the VNS  Trunk: truncal obesity   Neurologic  Exam   Mental Status: Awake and alert. He does not make eye contact. He has evidence of cognitive impairment. He resists some efforts at examination. He is unable to follow commands. He has very limited language.  Cranial Nerves: Fundoscopic exam reveals poorly visualized disc margins. Pupils equal, briskly reactive to light. Extraocular movements full without nystagmus. disconjugate gaze with lazy eye on the right. He does not regard objects in the left visual field as well as the right, he has difficulties judging distance when coordinating movements. Hearing appears intact as he turns to localize sounds. Neck flexion and extension normal after VNS was implanted .  Motor: Normal bulk with increased tone in all extremities. decreased strength with formal strength testing in all extremities. wheelchair for transport . Clumsy fine motor movements. 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  Sensory: Intact to withdrawal x4.  Coordination: Poor quality fine motor movements.  Gait and Station: Arises from chair without difficulty. Stance is narrow based. Gait demonstrates diplegic gait that is slow with shortened stride length. Varus position of his feet . He walked fairly well today without any assistance.  Reflexes: Diminished and symmetric. Toes downgoing. He had no clonus today.  Assessment 1.  Intractable generalized tonic-clonic seizures, 345.41. 2.  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 3.   Moderate cognitive impairment, 318.0. 4.  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  Patient is stable, but continues to have problems with recurrent seizures.  We have a point where I cannot alter his vagal nerve stimulator, and current medications are not likely to provide further treatment.  We have limited new options as well.   I plan no changes in his current treatment.  We will observe his seizure frequency and the decision about other possible treatments over the next month.  Deetta Perla MD

## 2013-05-04 NOTE — Telephone Encounter (Signed)
Note reviewed.  I called mom and told her that we would bring him in as soon as we had available slot.  We have an opening at 4 PM today.  Need to obtain laboratories and I will give her those labs after I see him.  We need to make certain that that VNS is working.

## 2013-05-13 ENCOUNTER — Telehealth: Payer: Self-pay | Admitting: Pediatrics

## 2013-05-13 LAB — 10-HYDROXYCARBAZEPINE: Triliptal/MTB(Oxcarbazepin): 33.8 ug/mL (ref 8.0–35.0)

## 2013-05-13 NOTE — Telephone Encounter (Signed)
Oxcarbazepine metabolite is 33.8 mcg/mL, in the therapeutic range.  I left a message for mother to call back.

## 2013-05-14 NOTE — Telephone Encounter (Signed)
[name], mom, lvm stating that she was returning call about Tylyn's lab results. She can be reached at [phone].

## 2013-05-14 NOTE — Telephone Encounter (Signed)
The level is a top therapeutic.  I would not make changes at this time.  I don't think it will benefit the patient.  Alternative medications that are available tend to be more sedating.  I am afraid that they will actually result in worsened seizure control.  Mother stated her understanding.

## 2013-05-26 ENCOUNTER — Telehealth: Payer: Self-pay

## 2013-05-26 DIAGNOSIS — G40309 Generalized idiopathic epilepsy and epileptic syndromes, not intractable, without status epilepticus: Secondary | ICD-10-CM

## 2013-05-26 MED ORDER — VIMPAT 200 MG PO TABS: ORAL_TABLET | ORAL | Status: DC

## 2013-05-26 MED ORDER — VIMPAT 100 MG PO TABS: ORAL_TABLET | ORAL | Status: DC

## 2013-05-26 MED ORDER — LEVETIRACETAM 1000 MG PO TABS: ORAL_TABLET | ORAL | Status: DC

## 2013-05-26 NOTE — Telephone Encounter (Signed)
[name] called and said that pt was completely out of medication. I called mom back and let her know it will be done today.

## 2013-05-26 NOTE — Telephone Encounter (Signed)
The Rx's were faxed to the pharmacy. TG

## 2013-08-04 ENCOUNTER — Encounter: Payer: Self-pay | Admitting: Pediatrics

## 2013-08-04 ENCOUNTER — Telehealth: Payer: Self-pay | Admitting: Pediatrics

## 2013-08-04 NOTE — Telephone Encounter (Signed)
Need a note on letter head saying it is medically needed for neisure

## 2013-08-04 NOTE — Telephone Encounter (Signed)
Ann, I have typed up the letter for Jonny RuizJohn but am unable to print it out.  Could you print out the letter from his chart and fax it for me?  Thanks.

## 2013-08-19 ENCOUNTER — Other Ambulatory Visit: Payer: Self-pay | Admitting: Pediatrics

## 2013-08-19 MED ORDER — DERMA-SMOOTHE/FS BODY 0.01 % EX OIL: 1.0000 "application " | TOPICAL_OIL | Freq: Two times a day (BID) | CUTANEOUS | Status: DC

## 2013-08-19 MED ORDER — AMLODIPINE BESYLATE 5 MG PO TABS: 5.0000 mg | ORAL_TABLET | Freq: Two times a day (BID) | ORAL | Status: DC

## 2013-08-21 ENCOUNTER — Ambulatory Visit (INDEPENDENT_AMBULATORY_CARE_PROVIDER_SITE_OTHER): Payer: Medicaid Other | Admitting: Pediatrics

## 2013-08-21 ENCOUNTER — Telehealth: Payer: Self-pay

## 2013-08-21 DIAGNOSIS — G40311 Generalized idiopathic epilepsy and epileptic syndromes, intractable, with status epilepticus: Secondary | ICD-10-CM

## 2013-08-21 DIAGNOSIS — I10 Essential (primary) hypertension: Secondary | ICD-10-CM

## 2013-08-21 DIAGNOSIS — K5909 Other constipation: Secondary | ICD-10-CM

## 2013-08-21 DIAGNOSIS — K5900 Constipation, unspecified: Secondary | ICD-10-CM

## 2013-08-21 DIAGNOSIS — J309 Allergic rhinitis, unspecified: Secondary | ICD-10-CM

## 2013-08-21 DIAGNOSIS — G40319 Generalized idiopathic epilepsy and epileptic syndromes, intractable, without status epilepticus: Secondary | ICD-10-CM

## 2013-08-21 DIAGNOSIS — G40419 Other generalized epilepsy and epileptic syndromes, intractable, without status epilepticus: Secondary | ICD-10-CM

## 2013-08-21 NOTE — Progress Notes (Signed)
Subjective:     Patient ID: Zachary [name], male   DOB: 09-09-1990, 23 y.o.   MRN: [id number]  HPI For past 1-2 weeks has been messing with ears, concerned about ear infection Has had 3 seizures this week, had not had any for 1.5 months as Abdomen stays distended, stools only with Miralax or an enema Gives Miralax up to 4 times per day, takes probiotic yogurt, water, stool softeners Last pooped on Tuesday Gives enemas 1-2 times per week Stool is hard pebbles when it comes Has done essential "clean out" with more frequent Miralax Does not end up doing clean out very often (last 1.5 months ago)  Gave single dose of diphenhydramine Monday, had seizure Tuesday, then again today Wednesday was drooling and staring, grand mal seizure today (4 minutes)  3 anti-epileptic medications Vagal nerve stimulator (as high as it can go)  Review of Systems See HPI    Objective:   Physical Exam  HENT:  Head: Normocephalic.  Right Ear: External ear normal.  Left Ear: External ear normal.  Mouth/Throat: Oropharynx is clear and moist. No oropharyngeal exudate.  Bilateral pale and swollen nasal mucosa  Abdominal: Soft. Bowel sounds are normal. He exhibits mass. He exhibits no distension. There is no tenderness.  Tubular massa in LUQ, non-tender      Assessment:     23 year old AAM with autism spectrum disorder, epilepsy, now with difficult chronic constipation and symptoms of seasonal allergies    Plan:     1. Trial of allergy medications, Claritin and Flonase, also discussed nasal saline lavage as adjunct 2. Recommended monthly bowel clean outs as addition to usual bowel regimen 3. Completed transportation request form 4. Follow-up as needed

## 2013-08-21 NOTE — Telephone Encounter (Signed)
I will check back with mother next week.  I'm not anxious to increase his dose of medication.

## 2013-08-21 NOTE — Telephone Encounter (Signed)
I called and talked to Mom. She said that Zachary [name] was ok now, was resting in car and seemed ok. She had talked to PCP and he has appt at 345 today to have his ears checked. She said that he has not missed medication and otherwise seemed ok. I told her that having him seen by PCP was good idea and that I would let Dr Sharene SkeansHickling know about his seizures. I told her to make no changes in his medications for now. TG

## 2013-08-21 NOTE — Telephone Encounter (Signed)
[name], mom, lvm stating that Zachary RuizJohn had 2 szs this week. She said that school called her and said that he is currently having one and that it has lasted 3 mins thus far. She is on her way to the school now. She asked that we call her back to let her know if we wanted her to bring him in to be seen or just to wait and monitor. She said that she could be reached on her cell at [phone]. I tried calling mom back and reached her vm. I asked her to call me back.  Mom called me right back. She said that when she arrived at the school his sz stopped. It was a [employer] Mal, lasted 4 mins. She said that Tuesday 08/18/13 he had a sz that lasted 1.5 mins. She said that it was his usual type sz, nothing out of the ordinary. On Wednesday 08/19/13, teacher told mom that she though he may have had a sz. He was drooling and staring. Mom said that he has been pulling and scratching on his left ear, the side the VNS is on. This has been going on for a few days. She called the ENT Dr.Woliki. He cannot see Lukasz until Tuesday 08/25/13. [name] said that she is going to call Clearnce's PCP to see if they will take a look at his ear. She said that PCP office is good at getting him in when he needs to be seen and is hopeful that he will be seen today. Other than this, he has not been ill or missed any medication, he is not running a fever. Please call [name] to discuss [phone].

## 2013-08-24 NOTE — Telephone Encounter (Signed)
I spoke with mother and she told me that the patient was constipated and had problems with allergies.  He has gone 6 weeks without seizures before this and had no seizures since last week.  I'm not going to make any changes at this time.  Mother was in agreement.

## 2013-08-27 ENCOUNTER — Telehealth: Payer: Self-pay | Admitting: *Deleted

## 2013-08-27 NOTE — Telephone Encounter (Signed)
[name] is returning Dr. Gerald LeitzHicklings call she apologizes for missing his call she was driving at the time. She can be reached at [phone]. MB

## 2013-08-27 NOTE — Telephone Encounter (Signed)
I called and talked to Mom. She said that Zachary [name] has been having increase in seizures. He also had seizures on 08/21/13. She said that his seizures used to be twitches around his face and these are more full body - he throws arms out, body extends, he has heavy drooling. After the seizure he has facial twitching, the frowns and seems uncomfortable. She said that he has been healthy and otherwise at his baseline. She checked his BP and it was slightly elevated.  He has had problems with constipation but that has improved.  He has not missed any medication. Mom asks if there is anything else to do at this point? Mom's number is [phone]. TG

## 2013-08-27 NOTE — Telephone Encounter (Signed)
Zachary [name] the patient's mom called and stated that the patient had a seizure last night at 9:40 pm lasting 2 minutes, another one at 3:20 am this morning lasting 1 and a half minutes and another at 8:10 am lasting 3 minutes. Afterwards she says that he frowns as if something is hurting him, he did not go to school today, he has been taking all of his meds as directed. Mom wants a return call to discuss what's going on with Zachary RuizJohn. Zachary [name] can be reached at [phone].    Thanks,  Zachary CruiseMichelle B.

## 2013-08-27 NOTE — Telephone Encounter (Signed)
I left a message for her to call back.  He is on very high doses of 3 drugs plus vagal nerve stimulator I'm not sure there is much else to do.

## 2013-08-28 NOTE — Telephone Encounter (Signed)
I spoke with mom for 5 minutes.  I told her that at this time, I don't have other recommendations.  We can consider other medicines that he has not taken but we will have to pull one of the current medications away and during that time, his seizures could get even worse.  She will call and request an appointment when we have an opening; please place him on a cancellation list if that opening is a long-time from now.

## 2013-08-31 NOTE — Telephone Encounter (Signed)
I spoke with Zachary [name], the patient has been scheduled for April 28 at 9:45 am and placed on the cancellation list, there was an opening tomorrow at 4:00 pm however Zachary [name] has an appointment with Dr. Lazarus SalinesWolicki for problems that he has been having with his ears so mom was unable to come tomorrow. MB

## 2013-08-31 NOTE — Telephone Encounter (Signed)
Noted, thank you

## 2013-09-01 ENCOUNTER — Ambulatory Visit: Payer: Medicaid Other | Admitting: Family

## 2013-09-03 ENCOUNTER — Telehealth: Payer: Self-pay | Admitting: *Deleted

## 2013-09-03 NOTE — Telephone Encounter (Signed)
Zachary [name] the patient's mom called and stated that Zachary [name] had 3 seizures on Tuesday, 2 in the morning and 1 in the afternoon and they lasted from 1 and a half minutes to 2 minutes. She said that she swiped his VNS which seemed to work for the first 2 seizures but did not help with the third. [name] says she's calling to keep Dr. Sharene SkeansHickling and [name] updated only and that she will continue to monitor him, she states she has an appt. On April 28th and there's no need for a return call.     Thanks,  Belenda CruiseMichelle B.

## 2013-09-03 NOTE — Telephone Encounter (Signed)
Noted, thank you

## 2013-09-09 ENCOUNTER — Telehealth: Payer: Self-pay | Admitting: Family

## 2013-09-09 NOTE — Telephone Encounter (Signed)
Noted  

## 2013-09-09 NOTE — Telephone Encounter (Signed)
Mom Aquilla Solianonya Gavidia called to say that she thinks she knows why Zachary RuizJohn had flurry of seizures last week. He ran out of Keppra and pharmacist filled with generic for a few days. Mom got the Brand Keppra refilled and he has been seizure free since being on brand again. Mom says that the days he was on generic are the days he had seizure breakthroughs. She said no need to call back but that she just wanted to let you know. I made a note in his med list that he should only receive brand. TG

## 2013-09-29 ENCOUNTER — Encounter: Payer: Self-pay | Admitting: Pediatrics

## 2013-09-29 ENCOUNTER — Ambulatory Visit (INDEPENDENT_AMBULATORY_CARE_PROVIDER_SITE_OTHER): Payer: Medicaid Other | Admitting: Pediatrics

## 2013-09-29 VITALS — BP 118/76 | HR 84 | Ht 61.0 in | Wt 149.2 lb

## 2013-09-29 DIAGNOSIS — G808 Other cerebral palsy: Secondary | ICD-10-CM

## 2013-09-29 DIAGNOSIS — G40319 Generalized idiopathic epilepsy and epileptic syndromes, intractable, without status epilepticus: Secondary | ICD-10-CM

## 2013-09-29 DIAGNOSIS — G4739 Other sleep apnea: Secondary | ICD-10-CM

## 2013-09-29 DIAGNOSIS — R269 Unspecified abnormalities of gait and mobility: Secondary | ICD-10-CM

## 2013-09-29 DIAGNOSIS — F840 Autistic disorder: Secondary | ICD-10-CM

## 2013-09-29 NOTE — Progress Notes (Signed)
Patient: Zachary [name] Zachary [name] MRN: [id number] Sex: male DOB: 1990/08/13  Provider: Deetta PerlaHICKLING,Ayce Pietrzyk H, MD [facility]  Note type: Routine return visit  History of Present Illness: Referral Source: Dr. Ferman HammingJames Hooker  [facility]: [facility] and [facility] Chief Complaint: Seizures  Zachary [name] Zachary [name] is a 23 y.o. male who returns for evaluation and management of intractable seizures, and reprogramming vagal nerve stimulator.  Zachary [name] was seen on September 29, 2013 for the first time since May 04, 2013.  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  He again had cluster seizures recently, but this time it appeared to be related to being given generic refill of anti-epileptic medication.  When mother recognized that her child was being given generic medicine, she contacted our office and we were able to switch to trade drug.  For the most part, his seizures are infrequent.  I interrogated his vagal nerve stimulator today and it is working well and was no reason to make changes in it other than what was necessary to evaluate the device.  Procedure: interrogation of vagal nerve stimulator  The implanted device is series 102, serial [id number], implanted May 23, 2011.  On interrogation: amplitude 2.50 mA, frequency 20 Hz, pulse width 250 s, stimulation duration 7 seconds, stimulation interval 0.5 minutes, magnet amplitude 2.50 mA, magnet stimulation duration 30 seconds, magnet pulse width 250 s.  Number of stimuli since last visit is 46; total number since implantation: 476.  Normal diagnostics with an amplitude of 2.50 milliamps again showed intact communication, output, and impedance. DC/ DC conversion code was 3. There is no indication to change the battery.  Review of Systems: 12 system review was unremarkable  Past Medical History  Diagnosis Date  .  Seizures   . MR (mental retardation)   . CP (cerebral palsy)   . Hypertension   . Fracture of femur, intertrochanteric, closed    Hospitalizations: no, Head Injury: no, Nervous System Infections: no, Immunizations up to date: yes Past Medical History I began to see him on July 10, 2004. He apparently was seen by me when he was eight months of age with developmental delay. Seizure activity; however, did not began until he is 14, two days after starting amitriptyline. 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 His most recent CT scan of the brain was on September 29, 2009, and showed no significant changes.   EEG showed diffuse background slowing.   He was treated with Depakote, Topamax, and Lyrica, but suffered side effects and agitation from those medications. He has taken Trileptal, Keppra, and Vimpat for quite some time. [facility] for 6 days.for a phase 1 evaluation. MRI of the brain showed ex-vacuo dilatation of his ventricles and cortical atrophy. Volume loss most pronounced in the parietal lobes bilaterally, temporal lobes with widening of the sylvian fissures and a paucity of white matter diffusely most pronounced again in the parietal lobes with patchy T2 hyperintensity. 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 There was no evidence of mesial temporal sclerosis.   Magnetoencephalogram showed epileptiform activity arising from the left posterior temple region with a field extending to the occipital lobe. This was not  seen with concurrent routine EEG.   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 This was somewhat worse in the right than the  left bilateral temporal cortex and temporal pole suggesting a longstanding insult.   Prolonged video EEG showed onset of bifrontal slowing that evolved into rhythmic activity with spikes of greater amplitude over the right central region with secondary generalization. Clinically, the patient has onset of a myoclonic jerk with his head turned to the left, movement of the left arm and leg, then tonic posturing followed by tonic-clonic generalized movements.   One day later the patient had bifrontal rhythmic beta range activity followed by decrement in voltage with fast beta range activity for about 30 seconds and then generalized rhythmic beta range activity. The patient had a slight head bob slowly lowered to the bed with stiffening of both upper extremities and tonic-clonic movement of both upper extremities followed by secondary generalization in his legs. A total of seven seizures were monitored. The last three were obscured by electrode artifact. Seizure onset appeared to occur from both frontal regions. This led to the recommendations to place the vagal nerve stimulator .  He has frequent ear infections, urinary tract infections, constipation and a hip fracture. He has had problems with neutropenia and thrombocytopenia. He had some vomiting in June, 2010 and developed aspiration pneumonia. He tends to have a temperature of 99 in the late evening. He has had several episodes in which his left knee has been dislocated. This happened as recently as August 28, 2009. He sat down in a chair and his knee went out of place. EMS was called and he was taken to the ER. While waiting for the ER physician, Zachary [name] stretched and his knee went back in to place. His mother said that he was subsequently seen by his orthopedist, who thinks that he may eventually need surgery on the knee.   Behavior History none  Surgical History Past Surgical History  Procedure Laterality Date  . Hip surgery    . Foot surgery    .  Implantation vagal nerve stimulator    . Tympanostomy tube placement  2009  . Tonsillectomy  2009  . Pediatomy  2009  . Fracture surgery      Family History family history includes Cancer in his paternal grandmother; Diabetes in his paternal grandfather. Family History is negative for migraines, seizures, cognitive impairment, blindness, deafness, birth defects, chromosomal disorder, or autism.  Social History History   Social History  . Marital Status: Single    Spouse Name: N/A    Number of Children: N/A  . Years of Education: N/A   Social History Main Topics  . Smoking status: Never Smoker   . Smokeless tobacco: Never Used  . Alcohol Use: No  . Drug Use: No  . Sexual Activity: No   Other Topics Concern  . None   Social History Narrative  . None   Educational level special education Living with mother  Hobbies/Interest: Enjoys riding in the car, going to parks and walking at [employer] and going to [employer]. School comments Tyse completed [employer] in 2012 and he now attends [employer].   Current Outpatient Prescriptions on File Prior to Visit  Medication Sig Dispense Refill  . amLODipine (NORVASC) 5 MG tablet Take 1 tablet (5 mg total) by mouth 2 (two) times daily.  60 tablet  3  .  cetirizine (ZYRTEC) 10 MG tablet Take 10 mg by mouth daily as needed for allergies.      . fluocinolone (DERMA-SMOOTHE/FS BODY) 0.01 % external oil Apply topically 2 (two) times daily.  120 mL  1  . Fluocinolone Acetonide (DERMA-SMOOTHE/FS BODY) 0.01 % OIL Apply 1 application topically 2 (two) times daily.  1 Bottle  3  . levETIRAcetam (KEPPRA) 1000 MG tablet Take 1+1/2 tablets twice per day BRAND MEDICALLY NECESSARY      . ondansetron (ZOFRAN ODT) 8 MG disintegrating tablet Take 1 tablet (8 mg total) by mouth every 8 (eight) hours as needed for nausea.  10 tablet  0  . OXTELLAR XR 600 MG TB24 Take 1,800 mg by mouth at bedtime.  93 tablet  5  .  polyethylene glycol (MIRALAX / GLYCOLAX) packet Take 17 g by mouth daily. Can increase to 4 capfuls a day if needed      . VIMPAT 100 MG TABS Take 1 tab by mouth in the morning and 1 tab at 3pm  60 tablet  5  . VIMPAT 200 MG TABS tablet Take 2 tabs by mouth at bedtime  60 tablet  5  . diazepam (DIASTAT) 2.5 MG GEL Place 2.5 mg rectally as needed. For when patient has a seizure that last over 7 minutes      . Fluocinolone Acetonide (DERMA-SMOOTHE/FS SCALP) 0.01 % OIL APPLY TWICE DAILY  120 mL  0  . hydrochlorothiazide (MICROZIDE) 12.5 MG capsule Take 12.5 mg by mouth as needed (Only gives this once or twice a month if ankles swell. Monitor BP at home).       No current facility-administered medications on file prior to visit.   The medication list was reviewed and reconciled. All changes or newly prescribed medications were explained.  A complete medication list was provided to the patient/caregiver.  Allergies  Allergen Reactions  . Depakote [Divalproex Sodium] Other (See Comments)    Low Platelets  . Lyrica [Pregabalin] Other (See Comments)    Sleepiness  . Penicillins   . Topamax [Topiramate] Other (See Comments)    Weight Loss    Physical Exam BP 118/76  Pulse 84  Ht 5\' 1"  (1.549 m)  Wt 149 lb 3.2 oz (67.677 kg)  BMI 28.21 kg/m2  General: well developed, well nourished young man, seated in wheelchair in exam room.  Head: head normocephalic and atraumatic.  Ears, Nose and Throat: oropharynx benign. 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  Neck: supple with no carotid or supraclavicular bruits.  Respiratory: lung clear to auscultation.  Cardiovascular: regular rate and rhythm, no murmurs; he has edema in both feet  Skin: facial acne. He has well healed surgical scars on his left neck and his left anterior chest from the VNS  Trunk: truncal obesity   Neurologic Exam   Mental Status: Awake and alert. He does not make eye contact. He has evidence of cognitive  impairment. He resists some efforts at examination. He is unable to follow commands. He has very limited language.  Cranial Nerves: Fundoscopic exam reveals poorly visualized disc margins. Pupils equal, briskly reactive to light. Extraocular movements full exotropia without nystagmus; disconjugate gaze with amblyopia on the right. He does not regard objects in the left visual field as well as the right, he has difficulties judging distance when coordinating movements. Hearing appears intact as he turns to localize sounds. Neck flexion and extension normal after VNS was implanted .  Motor: Normal bulk with increased tone  in all extremities. decreased strength with formal strength testing in all extremities. wheelchair for transport . Clumsy fine motor movements. 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  Sensory: Intact to withdrawal x4.  Coordination: Poor quality fine motor movements.  Gait and Station: Arises from wheelchair with mild difficulty. Stance is narrow based. Gait demonstrates diplegic gait that is slow with shortened stride length. Varus position of his feet . He walked fairly well today without any assistance.  Reflexes: Diminished and symmetric. Toes downgoing. He had no clonus today.  Assessment  1. 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 2. Secondarily generalized seizures, intractable, 345.11. 3. 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 4. 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  Plan The vagal nerve stimulator was successfully interrogated and requires no change.  I am also not going to change his antiepileptic medication.  In addition to reprogramming his vagal nerve stimulator   I spent 15 minutes of face-to-face time with Zachary [name] and his mother more than half of it in consultation.  Deetta PerlaWilliam H Jazir Newey MD

## 2013-10-30 ENCOUNTER — Encounter: Payer: Self-pay | Admitting: Pediatrics

## 2013-10-30 ENCOUNTER — Ambulatory Visit
Admission: RE | Admit: 2013-10-30 | Discharge: 2013-10-30 | Disposition: A | Discharge status: Home / Self Care | Payer: Medicaid Other | Source: Ambulatory Visit | Attending: Pediatrics | Admitting: Pediatrics

## 2013-10-30 ENCOUNTER — Telehealth: Payer: Self-pay | Admitting: *Deleted

## 2013-10-30 ENCOUNTER — Ambulatory Visit (INDEPENDENT_AMBULATORY_CARE_PROVIDER_SITE_OTHER): Payer: Medicaid Other | Admitting: Pediatrics

## 2013-10-30 VITALS — BP 100/70

## 2013-10-30 DIAGNOSIS — K5900 Constipation, unspecified: Secondary | ICD-10-CM

## 2013-10-30 DIAGNOSIS — Z0189 Encounter for other specified special examinations: Secondary | ICD-10-CM

## 2013-10-30 DIAGNOSIS — Z139 Encounter for screening, unspecified: Secondary | ICD-10-CM

## 2013-10-30 LAB — POCT HEMOGLOBIN: Hemoglobin: 14 g/dL — AB (ref 14.1–18.1)

## 2013-10-30 MED ORDER — ERYTHROMYCIN 5 MG/GM OP OINT: 1.0000 "application " | TOPICAL_OINTMENT | Freq: Three times a day (TID) | OPHTHALMIC | Status: DC

## 2013-10-30 MED ORDER — SULFAMETHOXAZOLE-TMP DS 800-160 MG PO TABS: 1.0000 | ORAL_TABLET | Freq: Two times a day (BID) | ORAL | Status: AC

## 2013-10-30 NOTE — Patient Instructions (Signed)
Otitis Media, Adult Otitis media is redness, soreness, and swelling (inflammation) of the middle ear. Otitis media may be caused by allergies or, most commonly, by infection. Often it occurs as a complication of the common cold. SIGNS AND SYMPTOMS Symptoms of otitis media may include:  Earache.  Fever.  Ringing in your ear.  Headache.  Leakage of fluid from the ear. DIAGNOSIS To diagnose otitis media, your health care provider will examine your ear with an otoscope. This is an instrument that allows your health care provider to see into your ear in order to examine your eardrum. Your health care provider also will ask you questions about your symptoms. TREATMENT  Typically, otitis media resolves on its own within 3 5 days. Your health care provider may prescribe medicine to ease your symptoms of pain. If otitis media does not resolve within 5 days or is recurrent, your health care provider may prescribe antibiotic medicines if he or she suspects that a bacterial infection is the cause. HOME CARE INSTRUCTIONS   Take your medicine as directed until it is gone, even if you feel better after the first few days.  Only take over-the-counter or prescription medicines for pain, discomfort, or fever as directed by your health care provider.  Follow up with your health care provider as directed. SEEK MEDICAL CARE IF:  You have otitis media only in one ear or bleeding from your nose or both.  You notice a lump on your neck.  You are not getting better in 3 5 days.  You feel worse instead of better. SEEK IMMEDIATE MEDICAL CARE IF:   You have pain that is not controlled with medicine.  You have swelling, redness, or pain around your ear or stiffness in your neck.  You notice that part of your face is paralyzed.  You notice that the bone behind your ear (mastoid) is tender when you touch it. MAKE SURE YOU:   Understand these instructions.  Will watch your condition.  Will get help  right away if you are not doing well or get worse. Document Released: 02/24/2004 Document Revised: 03/11/2013 Document Reviewed: 12/16/2012 Exit[employer] Patient Information 2014 [employer].  

## 2013-10-30 NOTE — Progress Notes (Signed)
Subjective   Zachary [name], 23 y.o. male, with a chronic history of SEIZURES, developmental delay, CP, MR and wheel chair bound who presents with banging his left ear with his fist and increased seizures. Mom says he usually have increased seizures with an infection  Symptoms started 2 days ago.  He is taking fluids well.  There are no other significant complaints other than abdomen is bloated and he is snoring a lot. Mom says he gets tired quickly and is wondering if he has sleep apnea.  The patient's history has been marked as reviewed and updated as appropriate.  Objective   BP 100/70  General appearance:  well developed and well nourished and well hydrated  Nasal: Neck:  Mild nasal congestion with clear rhinorrhea Neck is supple  Ears:  External ears are normal Right TM - normal landmarks and mobility Left TM - erythematous, dull and bulging  Oropharynx:  Mucous membranes are moist; there is mild erythema of the posterior pharynx  Lungs:  Lungs are clear to auscultation  Heart:  Regular rate and rhythm; no murmurs or rubs  Skin:  No rashes or lesions noted   Assessment   Acute left otitis media Seizures Rule out Sleep Apnea Possible constipation  Plan   1) Antibiotics per orders 2) Fluids, acetaminophen as needed 3) Recheck if symptoms persist for 2 or more days, symptoms worsen, or new symptoms develop. 4) refer for sleep study 5) Abdominal X ray ---constipation

## 2013-10-30 NOTE — Telephone Encounter (Signed)
I spoke with mother and she told me that Zachary [name] has an acute otitis media and a distended abdomen that may reflect constipation.  He always has seizures when he gets sick.  I recommended to leave his medication unchanged at this time.

## 2013-10-30 NOTE — Addendum Note (Signed)
Addended by: Halina Andreas on: 10/30/2013 12:36 PM   Modules accepted: Orders

## 2013-10-30 NOTE — Telephone Encounter (Signed)
Zachary [name], mother, stated the pt had two seizures yesterday after school. She stated that the pt was hitting his left ear. She does not know if that had anything to do with his seizures. Both seizures lasted for 2 minutes. The pt had one at home and one in the car. The mother took the pt to his pcp to see if he has an ear or throat infection at 10:00 am today. I called and left a message to get more details about the seizures at 10:55 am. She kept the pt home from school today.The mother can be reached at [phone]

## 2013-10-31 ENCOUNTER — Telehealth: Payer: Self-pay | Admitting: Pediatrics

## 2013-10-31 NOTE — Telephone Encounter (Signed)
Zachary [name] was wondering if you had heard from Zachary [name]'s xrays ?

## 2013-10-31 NOTE — Telephone Encounter (Addendum)
Discussed X ray results with grand-mom---significant constipation. Advised mom on enemas and miralax

## 2013-11-02 ENCOUNTER — Telehealth: Payer: Self-pay | Admitting: Pediatrics

## 2013-11-02 NOTE — Telephone Encounter (Signed)
Mother would like to talk to you about child still swatting at his ears

## 2013-11-02 NOTE — Telephone Encounter (Signed)
Spoke to mom and advised on continuing present treatment sine it does take time to take effect

## 2013-11-05 ENCOUNTER — Encounter: Payer: Self-pay | Admitting: *Deleted

## 2013-11-07 ENCOUNTER — Telehealth: Payer: Self-pay | Admitting: Pediatrics

## 2013-11-07 ENCOUNTER — Other Ambulatory Visit: Payer: Self-pay | Admitting: Pediatrics

## 2013-11-07 MED ORDER — ANTIPYRINE-BENZOCAINE 5.4-1.4 % OT SOLN: 3.0000 [drp] | Freq: Four times a day (QID) | OTIC | Status: AC | PRN

## 2013-11-07 MED ORDER — SULFAMETHOXAZOLE-TMP DS 800-160 MG PO TABS: 1.0000 | ORAL_TABLET | Freq: Two times a day (BID) | ORAL | Status: DC

## 2013-11-07 NOTE — Telephone Encounter (Signed)
Spoke with mother regarding break through seizures and continued "swatting at L ear." Mother had already talked with Dr. Sharene Skeans, seems most likely to be ear infection and would address that possibility first. Recent phone consultation with ENT concluded Bactrim best option, just completed 7 day course Will treat with second 7 day course of Bactrim DS, also Auralgan drops for pain Advised mother to come in for evaluation on Monday

## 2013-11-09 ENCOUNTER — Ambulatory Visit (INDEPENDENT_AMBULATORY_CARE_PROVIDER_SITE_OTHER): Payer: Medicaid Other | Admitting: Neurology

## 2013-11-09 ENCOUNTER — Encounter: Payer: Self-pay | Admitting: Neurology

## 2013-11-09 VITALS — BP 126/83 | HR 72 | Resp 17

## 2013-11-09 DIAGNOSIS — Z9689 Presence of other specified functional implants: Secondary | ICD-10-CM

## 2013-11-09 DIAGNOSIS — G4730 Sleep apnea, unspecified: Secondary | ICD-10-CM

## 2013-11-09 DIAGNOSIS — F73 Profound intellectual disabilities: Secondary | ICD-10-CM

## 2013-11-09 DIAGNOSIS — Z9889 Other specified postprocedural states: Secondary | ICD-10-CM

## 2013-11-09 HISTORY — DX: Presence of other specified functional implants: Z96.89

## 2013-11-09 NOTE — Progress Notes (Signed)
[facility]  Provider:  Melvyn Novasarmen  Shawntia Mangal, M D  Referring Provider: Preston FleetingHooker, James B, MD Primary Care Physician:  Ferman HammingHOOKER, JAMES, MD  MRDD, CP and witnessed apnea.   HPI:  Zachary [name] is a 23 y.o. male  Is seen here as a referral from [facility]ickling [facility] and Dr. Ane PaymentHooker for an evaluation of possible sleep apnea.   Zachary [name] happy in an incident when he started being followed by Dr. Williemae Areaon Young is his primary pediatrician he has no after Dr. Roxy CedarYoung's retirement been followed by Dr. Ane PaymentHooker. His peak pediatric neurologist has always been Dr. Sharene SkeansHickling. 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 He also has traits of autism, osteopenia and he has survived a MRSA infection.  Years ago he was evaluated for possible nocturnal seizures with a specifically arranged mom parch in his sleep study. Only one obstructive apnea had been told at the time and 14 shallow breathing spells. The AHI was 5.0 and the RDI 8.0 he also had no oxygen desaturations at that night. Diaphoresis was noted which may be changes. This mother now reports that he has very fragmented sleep which is not necessarily new but she has witnessed him to gasp for air and stopped breathing. This alerted his pediatrician to request a repeat sleep study. Each time the patient still has a breakthrough seizure , he will be very sleepy for about 30 minutes afterwards . He snores loudly.  He just recently had an ear infection and had several seizures before antibiotics were started.    Review of Systems: Out of a complete 14 system review, the patient complains of only the following symptoms, and all other reviewed systems are negative. MRDD, non verbal but vocal.  Wheelchair, but he can walk- his mother restraints him to keep him from walking off, he is 24 hours total care. He lives behind closed doors.  HST is preferred to check for apnea.   VNS settings accessed in Dr Gerald LeitzHicklings last epic notes.   History   Social History  . Marital Status: Single    Spouse Name: N/A    Number of Children: N/A  . Years of Education: N/A   Occupational History  . Not on file.   Social History Main Topics  . Smoking status: Never Smoker   . Smokeless tobacco: Never Used  . Alcohol Use: No  . Drug Use: No  . Sexual Activity: No   Other Topics Concern  . Not on file   Social History Narrative   Patient lives with his mother, sister and brother.    Family History  Problem Relation Age of Onset  . Cancer Paternal Grandmother     Died in her 1460's  . Diabetes Paternal Grandfather     Died in his 5560's     Past Medical History  Diagnosis Date  . Seizures   . MR (mental retardation)   . CP (cerebral palsy)   . Hypertension   . Fracture of femur, intertrochanteric, closed   . Recurrent apnea   . 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 .     Past Surgical History  Procedure Laterality Date  . Hip surgery    . Foot surgery    . Implantation vagal nerve stimulator    . Tympanostomy tube placement  2009  .  Tonsillectomy  2009  . Pediatomy  2009  . Fracture surgery      Current Outpatient Prescriptions  Medication Sig Dispense Refill  . amLODipine (NORVASC) 5 MG tablet Take 1 tablet (5 mg total) by mouth 2 (two) times daily.  60 tablet  3  . antipyrine-benzocaine (AURALGAN) otic solution Place 3 drops into the right ear 4 (four) times daily as needed.  10 mL  1  . cetirizine (ZYRTEC) 10 MG tablet Take 10 mg by mouth daily as needed for allergies.      . diazepam (DIASTAT) 2.5 MG GEL Place 2.5 mg rectally as needed. For when patient has a seizure that last over 7 minutes      . erythromycin [facility]) ophthalmic ointment Place 1 application into the right eye 3 (three) times daily.  3.5 g  3  . fluocinolone (DERMA-SMOOTHE/FS  BODY) 0.01 % external oil Apply topically 2 (two) times daily.  120 mL  1  . Fluocinolone Acetonide (DERMA-SMOOTHE/FS BODY) 0.01 % OIL Apply 1 application topically 2 (two) times daily.  1 Bottle  3  . Fluocinolone Acetonide (DERMA-SMOOTHE/FS SCALP) 0.01 % OIL APPLY TWICE DAILY  120 mL  0  . hydrochlorothiazide (MICROZIDE) 12.5 MG capsule Take 12.5 mg by mouth as needed (Only gives this once or twice a month if ankles swell. Monitor BP at home).      Marland Kitchen levETIRAcetam (KEPPRA) 1000 MG tablet Take 1+1/2 tablets twice per day BRAND MEDICALLY NECESSARY      . ondansetron (ZOFRAN ODT) 8 MG disintegrating tablet Take 1 tablet (8 mg total) by mouth every 8 (eight) hours as needed for nausea.  10 tablet  0  . OXTELLAR XR 600 MG TB24 Take 1,800 mg by mouth at bedtime.  93 tablet  5  . polyethylene glycol (MIRALAX / GLYCOLAX) packet Take 17 g by mouth daily. Can increase to 4 capfuls a day if needed      . VIMPAT 100 MG TABS Take 1 tab by mouth in the morning and 1 tab at 3pm  60 tablet  5  . VIMPAT 200 MG TABS tablet Take 2 tabs by mouth at bedtime  60 tablet  5   No current facility-administered medications for this visit.    Allergies as of 11/09/2013 - Review Complete 11/09/2013  Allergen Reaction Noted  . Depakote [divalproex sodium] Other (See Comments) 08/26/2012  . Lyrica [pregabalin] Other (See Comments) 08/26/2012  . Penicillins  06/25/2011  . Topamax [topiramate] Other (See Comments) 08/26/2012    Vitals: BP 126/83  Pulse 72  Resp 17 Last Weight:  Wt Readings from Last 1 Encounters:  09/29/13 149 lb 3.2 oz (67.677 kg)   Last Height:   Ht Readings from Last 1 Encounters:  09/29/13 5\' 1"  (1.549 m)   Physical exam:  General: The patient is awake, alert and appears not in acute distress. The patient is well groomed. Head: Normocephalic, atraumatic. Neck is supple. Mallampati 3 , neck circumference: 17  Inches, short statue.  Cardiovascular:  Regular rate and rhythm , without   murmurs or carotid bruit, and without distended neck veins. Respiratory: Lungs are clear to auscultation. Skin:  Without evidence of edema, or rash Trunk: BMI is elevated - patient has a distended abdomen.   Neurologic exam : MRDD, alert , but non verbal.  Cranial nerves: Pupils are equal, 3 mm  and non-reactive to light. Funduscopic exam impossible. EOM not tested.  Facial motor strength is symmetric and tongue and uvula move  midline.  Motor exam:  Elevated tone and decreased  Voluntary motor  strength in all extremities.  Sensory:  Fine touch, pinprick and vibration were tested in all extremities- Watt withdrew. Proprioception is not tested.  Coordination: Rapid alternating movements in the fingers/hands was not performed .   same with Finger-to-nose maneuver   Gait and station: Patient walks with assistance and without but is difficult to guide.  Deep tendon reflexes: in the upper and lower extremities are brisk ,  The right hand is fisted  The left arm extended.    Assessment:  After physical and neurologic examination, review of laboratory studies, imaging, neurophysiology testing and pre-existing records, assessment is  1) this patient will requiere a home sleep test to screen for apnea. The sleep  laboratory does not provide hospital beds or sitter services.   Plan:  Treatment plan and additional workup :  2) HST - this patient cannot attend sleep lab for a study. If medicaid does not permit this, I will ask for ONO.

## 2013-11-09 NOTE — Patient Instructions (Signed)

## 2013-11-10 ENCOUNTER — Encounter: Payer: Self-pay | Admitting: Pediatrics

## 2013-11-10 ENCOUNTER — Ambulatory Visit (INDEPENDENT_AMBULATORY_CARE_PROVIDER_SITE_OTHER): Payer: Medicaid Other | Admitting: Pediatrics

## 2013-11-10 VITALS — Wt 150.0 lb

## 2013-11-10 DIAGNOSIS — K5900 Constipation, unspecified: Secondary | ICD-10-CM

## 2013-11-10 DIAGNOSIS — Z09 Encounter for follow-up examination after completed treatment for conditions other than malignant neoplasm: Secondary | ICD-10-CM | POA: Insufficient documentation

## 2013-11-10 DIAGNOSIS — Z8669 Personal history of other diseases of the nervous system and sense organs: Principal | ICD-10-CM

## 2013-11-10 DIAGNOSIS — K5909 Other constipation: Secondary | ICD-10-CM

## 2013-11-10 NOTE — Patient Instructions (Signed)
Continue course of antibiotics Start taking allergy medicine Left ear infection resolving

## 2013-11-10 NOTE — Progress Notes (Signed)
Presents  for recheck of ears after change in medication for ear infection. Per mom, still rubbing/digging at left ear. No fevers.     Review of Systems  Constitutional:  Negative for  appetite change.  HENT:  Negative for nasal and ear discharge.  positive for nasal congestion Eyes: Negative for discharge, redness and itching.  Respiratory:  Negative for cough and wheezing.   Cardiovascular: Negative.  Gastrointestinal: Negative for vomiting and diarrhea. positive for constipation Musculoskeletal: Negative for arthralgias.  Skin: Negative for rash.  Neurological: hx of seizures, seizure on Friday and on Saturday     Objective:   Physical Exam  Constitutional: Appears well-developed and well-nourished.   HENT:  Ears: Both TM's with PE tubes, right TM normal, left TM with resolving infection, no drainage, no redness Nose: No nasal discharge.  Mouth/Throat: Mucous membranes are moist. .  Eyes: Pupils are equal, round, and reactive to light.  Neck: Normal range of motion..  Cardiovascular: Regular rhythm.  No murmur heard. Pulmonary/Chest: Effort normal and breath sounds normal. No wheezes with  no retractions.  Abdominal: Firm, distended. Bowel sounds are normal.   Musculoskeletal: Normal range of motion.  Neurological: Active and alert.  Skin: Skin is warm and moist. No rash noted.      Assessment:      Follow up ear infection-resolving Constipation, resolving   Plan:   Complete course of antibiotics Restart allergy medications Continue using Miralax for GI clean out  Follow as needed

## 2013-11-16 ENCOUNTER — Other Ambulatory Visit: Payer: Self-pay

## 2013-11-16 DIAGNOSIS — G40309 Generalized idiopathic epilepsy and epileptic syndromes, not intractable, without status epilepticus: Secondary | ICD-10-CM

## 2013-11-16 MED ORDER — VIMPAT 100 MG PO TABS: ORAL_TABLET | ORAL | Status: DC

## 2013-11-16 MED ORDER — LEVETIRACETAM 1000 MG PO TABS: ORAL_TABLET | ORAL | Status: DC

## 2013-11-16 MED ORDER — VIMPAT 200 MG PO TABS: ORAL_TABLET | ORAL | Status: DC

## 2013-12-15 ENCOUNTER — Other Ambulatory Visit: Payer: Self-pay | Admitting: Pediatrics

## 2013-12-20 ENCOUNTER — Encounter: Payer: Self-pay | Admitting: Neurology

## 2013-12-20 ENCOUNTER — Encounter (INDEPENDENT_AMBULATORY_CARE_PROVIDER_SITE_OTHER): Payer: Medicaid Other | Admitting: Neurology

## 2013-12-20 DIAGNOSIS — G4730 Sleep apnea, unspecified: Secondary | ICD-10-CM

## 2013-12-20 DIAGNOSIS — G4737 Central sleep apnea in conditions classified elsewhere: Secondary | ICD-10-CM

## 2013-12-30 ENCOUNTER — Ambulatory Visit (INDEPENDENT_AMBULATORY_CARE_PROVIDER_SITE_OTHER): Payer: Medicaid Other | Admitting: Pediatrics

## 2013-12-30 ENCOUNTER — Encounter: Payer: Self-pay | Admitting: Pediatrics

## 2013-12-30 VITALS — BP 110/70 | HR 72 | Ht 61.0 in | Wt 158.6 lb

## 2013-12-30 DIAGNOSIS — J309 Allergic rhinitis, unspecified: Secondary | ICD-10-CM | POA: Insufficient documentation

## 2013-12-30 DIAGNOSIS — F840 Autistic disorder: Secondary | ICD-10-CM | POA: Insufficient documentation

## 2013-12-30 DIAGNOSIS — G4739 Other sleep apnea: Secondary | ICD-10-CM

## 2013-12-30 DIAGNOSIS — F71 Moderate intellectual disabilities: Secondary | ICD-10-CM

## 2013-12-30 DIAGNOSIS — G808 Other cerebral palsy: Secondary | ICD-10-CM | POA: Insufficient documentation

## 2013-12-30 DIAGNOSIS — G40319 Generalized idiopathic epilepsy and epileptic syndromes, intractable, without status epilepticus: Secondary | ICD-10-CM

## 2013-12-30 MED ORDER — DIASTAT ACUDIAL 20 MG RE GEL: RECTAL | Status: DC

## 2013-12-30 MED ORDER — CETIRIZINE HCL 10 MG PO TABS: 10.0000 mg | ORAL_TABLET | Freq: Every day | ORAL | Status: DC | PRN

## 2013-12-30 NOTE — Progress Notes (Signed)
Patient: Zachary AlbertsJohn T [name] MRN: [id number] Sex: male DOB: 05/18/1991  Provider: Deetta PerlaHICKLING,Jacquez Sheetz H, MD [facility]  Note type: Routine return visit  History of Present Illness: Referral Source: Dr. Ferman HammingJames Hooker History [facility]: [facility] and [facility] Chief Complaint: Seizures  Zachary [name] is a 23 y.o. male who returns for evaluation and management of intractable epilepsy, and complex sleep apnea.  Zachary [name] was seen on December 30, 2013, for the first time since September 30, 2013.  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  The patient in addition to four antiepileptic drugs also has a vagal nerve stimulator.  The vagal nerve stimulator probably controlled his seizures better than any of the medications.  [facility]  He is not a candidate for surgery to alleviate his seizures.  The etiology of his dysfunction is unclear and suggests perhaps some form of intrauterine or postpartum diffuse insult.  His mother noted that he had problems with sleep apnea and as a result of this I recommended a polysomnogram.  This was performed on December 20, 2013.  The results were recently released and revealed a severe complex sleep apnea with episodes of desaturation, and loud snoring, which did not apparently cause arousal.  An attempt to evaluate him with CPAP titration was unsuccessful.  He did not have periodic limb movements and did not show significant seizure activity.  In separate note, the technician who performed the study noted that there was a correlation between the vagal nerve stimulator and his episodes of apnea.  He also mentioned that the episodes of apnea were not prolonged enough to cause desaturation or to cause arousal.  Nonetheless, it is a  concern to me that this happens and I wonder if lessening the frequency of stimuli of his vagal nerve stimulator, might result in less apnea.  Until he can be seen again by Dr. Vickey Hugerohmeier, I think that is all that can be done to deal with his sleep apnea.  His mother very much wants to try to have a mask fit on him despite the fact that he had a great aversion to it during the sleep study.  He had seizures on Saturday and today.  These were relatively brief generalized seizures be that began with staring spells.  His last seizure was a month ago which is quite remarkable.  Procedure: interrogation of vagal nerve stimulator   The implanted device is series 102, serial [id number], implanted May 23, 2011.  On interrogation: amplitude 2.50 mA, frequency 20 Hz, pulse width 250 s, stimulation duration 7 seconds, stimulation interval 0.5 minutes, magnet amplitude 2.50 mA, magnet stimulation duration 30 seconds, magnet pulse width 250 s.  Number of stimuli since last visit is 25; total number since implantation: 501.  Normal diagnostics with an amplitude of 2.50 milliamps again showed intact communication, output, and impedance. DC/ DC conversion code was 3. There is no indication to change the battery.  I reprogrammed the device to stimulate every 0.8 minutes.  This will result in 50% less stimuli.  Hopefully we'll diminish the number of apneic events 3 at we will see whether or not seizures become more frequent.   Review of Systems: 12 system review was remarkable for seizures, allergies and cough  Past Medical History  Diagnosis Date  . Seizures   . MR (mental retardation)   .  CP (cerebral palsy)   . Hypertension   . Fracture of femur, intertrochanteric, closed   . Recurrent apnea   . 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 Marland Kitchen    Hospitalizations: No., Head Injury: No., Nervous System  Infections: No., Immunizations up to date: Yes.   Past Medical History I began to see him on July 10, 2004. He apparently was seen by me when he was eight months of age with developmental delay. Seizure activity; however, did not began until he is 14, two days after starting amitriptyline. 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 His most recent CT scan of the brain was on September 29, 2009, and showed no significant changes.   EEG showed diffuse background slowing.   He was treated with Depakote, Topamax, and Lyrica, but suffered side effects and agitation from those medications. He has taken Trileptal, Keppra, and Vimpat for quite some time. He was seen at [facility] on Oct 27, 2010 for 6 days.for a phase 1 evaluation. MRI of the brain showed ex-vacuo dilatation of his ventricles and cortical atrophy. Volume loss most pronounced in the parietal lobes bilaterally, temporal lobes with widening of the sylvian fissures and a paucity of white matter diffusely most pronounced again in the parietal lobes with patchy T2 hyperintensity. 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 There was no evidence of mesial temporal sclerosis.   Magnetoencephalogram showed epileptiform activity arising from the left posterior temple region with a field extending to the occipital lobe. This was not seen with concurrent routine EEG.   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 This was somewhat worse in the right than the left bilateral temporal cortex and temporal pole suggesting a longstanding insult.   Prolonged video EEG showed onset of bifrontal slowing that evolved into rhythmic activity with  spikes of greater amplitude over the right central region with secondary generalization. Clinically, the patient has onset of a myoclonic jerk with his head turned to the left, movement of the left arm and leg, then tonic posturing followed by tonic-clonic generalized movements.   One day later the patient had bifrontal rhythmic beta range activity followed by decrement in voltage with fast beta range activity for about 30 seconds and then generalized rhythmic beta range activity. The patient had a slight head bob slowly lowered to the bed with stiffening of both upper extremities and tonic-clonic movement of both upper extremities followed by secondary generalization in his legs. A total of seven seizures were monitored. The last three were obscured by electrode artifact. Seizure onset appeared to occur from both frontal regions. This led to the recommendations to place the vagal nerve stimulator.  .  He has frequent ear infections, urinary tract infections, constipation and a hip fracture. He has had problems with neutropenia and thrombocytopenia. He had some vomiting in June, 2010 and developed aspiration pneumonia. He tends to have a temperature of 99 in the late evening. He has had several episodes in which his left knee has been dislocated. This happened as recently as August 28, 2009. He sat down in a chair and his knee went out of  place. EMS was called and he was taken to the ER. While waiting for the ER physician, Jaspreet stretched and his knee went back in to place. His mother said that he was subsequently seen by his orthopedist, who thinks that he may eventually need surgery on the knee.   Behavior History none  Surgical History Past Surgical History  Procedure Laterality Date  . Hip surgery    . Foot surgery    . Implantation vagal nerve stimulator    . Tympanostomy tube placement  2009  . Tonsillectomy  2009  . Pediatomy  2009  . Fracture surgery      Family History family history  includes Cancer in his paternal grandmother; Diabetes in his paternal grandfather. Family History is negative for migraines, seizures, intellectual disability, blindness, deafness, birth defects, chromosomal disorder, or autism.  Social History History   Social History  . Marital Status: Single    Spouse Name: N/A    Number of Children: N/A  . Years of Education: N/A   Social History Main Topics  . Smoking status: Never Smoker   . Smokeless tobacco: Never Used  . Alcohol Use: No  . Drug Use: No  . Sexual Activity: No   Other Topics Concern  . None   Social History Narrative   Patient lives with his mother, sister and brother.   Educational level special education Occupation: N/A Living with mother and maternal grandmother  Hobbies/Interest: Enjoys riding in the car. School comments Hosie completed [employer] in 2013 he now attends After [employer].  Current Outpatient Prescriptions on File Prior to Visit  Medication Sig Dispense Refill  . amLODipine (NORVASC) 5 MG tablet TAKE ONE TABLET TWICE DAILY  60 tablet  2  . erythromycin (ROMYCIN) ophthalmic ointment Place 1 application into the right eye 3 (three) times daily.  3.5 g  3  . hydrochlorothiazide (MICROZIDE) 12.5 MG capsule Take 12.5 mg by mouth as needed (Only gives this once or twice a month if ankles swell. Monitor BP at home).      Marland Kitchen levETIRAcetam (KEPPRA) 1000 MG tablet Take 1+1/2 tablets twice per day BRAND MEDICALLY NECESSARY  93 tablet  1  . OXTELLAR XR 600 MG TB24 Take 1,800 mg by mouth at bedtime.  93 tablet  5  . polyethylene glycol (MIRALAX / GLYCOLAX) packet Take 17 g by mouth daily. Can increase to 4 capfuls a day if needed      . VIMPAT 100 MG TABS Take 1 tab by mouth in the morning and 1 tab at 3pm  60 tablet  1  . VIMPAT 200 MG TABS tablet Take 2 tabs by mouth at bedtime  60 tablet  1   No current facility-administered medications on file prior to visit.   The medication list was reviewed  and reconciled. All changes or newly prescribed medications were explained.  A complete medication list was provided to the patient/caregiver.  Allergies  Allergen Reactions  . Depakote [Divalproex Sodium] Other (See Comments)    Low Platelets  . Lyrica [Pregabalin] Other (See Comments)    Sleepiness  . Penicillins   . Topamax [Topiramate] Other (See Comments)    Weight Loss    Physical Exam BP 110/70  Pulse 72  Ht 5\' 1"  (1.549 m)  Wt 158 lb 9.6 oz (71.94 kg)  BMI 29.98 kg/m2  General: well developed, well nourished young man, seated in wheelchair in exam room.  Head: head normocephalic and atraumatic.  Ears, Nose and Throat:  oropharynx benign. 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  Neck: supple with no carotid or supraclavicular bruits.  Respiratory: lung clear to auscultation.  Cardiovascular: regular rate and rhythm, no murmurs; he has edema in both feet  Skin: facial acne. He has well healed surgical scars on his left neck and his left anterior chest from the VNS  Trunk: truncal obesity   Neurologic Exam   Mental Status: Awake and alert. He does not make eye contact. He has evidence of cognitive impairment. He resists some efforts at examination. He is unable to follow commands. He has very limited language.  Cranial Nerves: Fundoscopic exam reveals poorly visualized disc margins. Pupils equal, briskly reactive to light. Extraocular movements full exotropia without nystagmus; disconjugate gaze with amblyopia on the right. He does not regard objects in the left visual field as well as the right, he has difficulties judging distance when coordinating movements. Hearing appears intact as he turns to localize sounds. Neck flexion and extension normal after VNS was implanted .  Motor: Normal bulk with increased tone in all extremities. decreased strength with formal strength testing in all extremities. wheelchair for transport . Clumsy fine motor movements. Left  arm spastic hemiparesis with flexion contracture at the elbow and a claw hand deformity.  Sensory: Intact to withdrawal x4.  Coordination: Poor quality fine motor movements.  Gait and Station: Arises from wheelchair with mild difficulty. Stance is narrow based. Gait demonstrates diplegic gait that is slow with shortened stride length. Varus position of his feet . He walked fairly well today without any assistance.  Reflexes: Diminished and symmetric. Toes downgoing. He had no clonus today.  Assessment 1. Localization related seizures with complex partial, symptomatology, and secondary generalization, 345.41, 345.11. 2. 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 3. Moderate intellectual disabilities, 318.0. 4. Autism spectrum disorder with accompanying intellectual impairment, requiring very substantial support (level 3). 5. 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  Plan I refilled his Diastat and increased the dose to 15 mg.  He has recently gained a lot of weight for reasons that his mother does not understand because she has not changed his feeding patterns.  I will discuss this case with Dr. Vickey Huger, but I reprogrammed his vagal nerve stimulator and increased the stimulus from 0.5 minutes to 0.8 minutes.  He starts to have more frequent seizures, we may need to rethink the process of lowering his vagal nerve stimulator frequency a process that could bring about better seizure control or worse.  I interrogated the vagal nerve stimulator and spent 30 minutes of face-to-face time with the patient and his mother and more than half of it in consultation.  Deetta Perla MD

## 2013-12-30 NOTE — Patient Instructions (Signed)

## 2013-12-30 NOTE — Progress Notes (Signed)
Subjective:     Zachary [name] is a 23 y.o. male who presents for evaluation and treatment of allergic symptoms. Symptoms include: clear rhinorrhea, cough, nasal congestion, postnasal drip and pressure sensation in ears and are present in a seasonal pattern. Treatment currently includes none and is not effective. The following portions of the patient's history were reviewed and updated as appropriate: allergies, current medications, past family history, past medical history, past social history, past surgical history and problem list.  Review of Systems Pertinent items are noted in HPI.    Objective:    General appearance: cooperative and no distress Eyes: conjunctivae/corneas clear. PERRL, EOM's intact. Fundi benign. Ears: normal TM's and external ear canals both ears Nose: Nares normal. Septum midline. Mucosa normal. No drainage or sinus tenderness., mild congestion Throat: lips, mucosa, and tongue normal; teeth and gums normal Lungs: clear to auscultation bilaterally Heart: regular rate and rhythm, S1, S2 normal, no murmur, click, rub or gallop Abdomen: soft, non-tender; bowel sounds normal; no masses,  no organomegaly    Assessment:    Allergic rhinitis.    Plan:    Medications: oral antihistamines: Zyrtec. Allergen avoidance discussed. Follow-up as needed Zyrtec samples given

## 2014-01-02 ENCOUNTER — Encounter: Payer: Self-pay | Admitting: Neurology

## 2014-01-05 ENCOUNTER — Other Ambulatory Visit: Payer: Self-pay | Admitting: Family

## 2014-01-05 DIAGNOSIS — G40319 Generalized idiopathic epilepsy and epileptic syndromes, intractable, without status epilepticus: Secondary | ICD-10-CM

## 2014-01-05 MED ORDER — OXTELLAR XR 600 MG PO TB24: ORAL_TABLET | ORAL | Status: DC

## 2014-01-13 ENCOUNTER — Other Ambulatory Visit: Payer: Self-pay | Admitting: Family

## 2014-01-13 DIAGNOSIS — G40309 Generalized idiopathic epilepsy and epileptic syndromes, not intractable, without status epilepticus: Secondary | ICD-10-CM

## 2014-01-13 MED ORDER — VIMPAT 100 MG PO TABS: ORAL_TABLET | ORAL | Status: DC

## 2014-01-13 MED ORDER — VIMPAT 200 MG PO TABS: ORAL_TABLET | ORAL | Status: DC

## 2014-01-18 ENCOUNTER — Ambulatory Visit: Payer: Medicaid Other | Admitting: Neurology

## 2014-01-29 ENCOUNTER — Ambulatory Visit (INDEPENDENT_AMBULATORY_CARE_PROVIDER_SITE_OTHER): Payer: Medicaid Other | Admitting: Pediatrics

## 2014-01-29 VITALS — BP 118/78 | Ht 60.5 in | Wt 161.8 lb

## 2014-01-29 DIAGNOSIS — T50905A Adverse effect of unspecified drugs, medicaments and biological substances, initial encounter: Secondary | ICD-10-CM

## 2014-01-29 DIAGNOSIS — H66009 Acute suppurative otitis media without spontaneous rupture of ear drum, unspecified ear: Secondary | ICD-10-CM

## 2014-01-29 DIAGNOSIS — K5909 Other constipation: Secondary | ICD-10-CM

## 2014-01-29 DIAGNOSIS — I10 Essential (primary) hypertension: Secondary | ICD-10-CM

## 2014-01-29 DIAGNOSIS — G40319 Generalized idiopathic epilepsy and epileptic syndromes, intractable, without status epilepticus: Secondary | ICD-10-CM

## 2014-01-29 DIAGNOSIS — R635 Abnormal weight gain: Secondary | ICD-10-CM

## 2014-01-29 DIAGNOSIS — R638 Other symptoms and signs concerning food and fluid intake: Secondary | ICD-10-CM

## 2014-01-29 DIAGNOSIS — H66001 Acute suppurative otitis media without spontaneous rupture of ear drum, right ear: Secondary | ICD-10-CM

## 2014-01-29 DIAGNOSIS — F840 Autistic disorder: Secondary | ICD-10-CM

## 2014-01-29 DIAGNOSIS — Z23 Encounter for immunization: Secondary | ICD-10-CM

## 2014-01-29 DIAGNOSIS — Z0000 Encounter for general adult medical examination without abnormal findings: Secondary | ICD-10-CM

## 2014-01-29 DIAGNOSIS — G4730 Sleep apnea, unspecified: Secondary | ICD-10-CM

## 2014-01-29 DIAGNOSIS — K5900 Constipation, unspecified: Secondary | ICD-10-CM

## 2014-01-29 MED ORDER — CEFDINIR 300 MG PO CAPS: 300.0000 mg | ORAL_CAPSULE | Freq: Two times a day (BID) | ORAL | Status: DC

## 2014-01-29 MED ORDER — ONDANSETRON 8 MG PO TBDP: 8.0000 mg | ORAL_TABLET | Freq: Three times a day (TID) | ORAL | Status: DC | PRN

## 2014-01-29 NOTE — Progress Notes (Signed)
1. Weight: has gained 20 pounds in last 12 months, has not changed any diet patterns (except for stopping Ensure) 2. After [name], has multiple forms to complete for school, needs ASAP since year just started  Medications: Amlodipine 5 mg bid Cetirizine 10 mg as needed HCTZ 12.5 mg as needed Miralax 1 cap as needed Derma Smoothe as needed Zofran suppository or dissolving tablet** Diastat as needed seizure longer than 2 minutes  [Antiepileptics] Oxtellar 1800 mg qhs (most recently added, about 1 year ago)  (maybe weight gain, though stated as less severe) Vimpat 100 mg bid        (weight neutral) Vimpat 400 mg qhs        (weight neutral) Keppra 1500 mg bid        (weight neutral)  3. Has had a sleep study, diagnosed with Obstructive Sleep Apnea, considering CPAP machine Discussed impact on weight, blood pressure, seizure thresh-hold; also issues with compliance Following up with Dr. Vickey Huger  4. Relies on mother to provide food and to feed him, she really gets to manage his intake Monitor intake, try to get him to exercise as best as he can Managing sleep apnea, trial of CPAP Blood work: Hgb A1c, fasting lipid panel, CMP, thyroid panel, insulin level Communicate these results to Dr. Sharene Skeans  5. Has been pushing and hitting at ears, hitting at the ear Two weeks ago had drainage from ears (had tubes in place)  6. Working on constipation with Miralax  7. Blood pressure was up recently, when goes up has seizures  8. Immunizations: Influenza given today after discussing risks and benefits with mother 59. Pus draining from R tube, pus in canal, oral antibiotic would be best [Cefdinir 300 mg bid for 10 days  Physical exam:  General: The patient is awake, alert and appears not in acute distress. The patient is well groomed.  Head: Normocephalic, atraumatic. Neck is supple, normal ROM. Ears: R TM with pus, see tube in place with pus coming out Cardiovascular: Regular rate and  rhythm , without murmurs or carotid bruit, and without distended neck veins.  Respiratory: Lungs are clear to auscultation.  Skin: Without evidence of edema, or rash  Trunk: BMI is elevated - patient has a distended abdomen.  Neurologic exam : Alert , but non verbal.   Assessment: 23 year old AAM with autism spectrum disorder, generalized convulsive epilepsy (intractable), s/p placement of vagus nerve stimulator, hypertension, chronic constipation; overall is doing well per baseline.  However, has new diagnoses of: 1) recent weight gain (20 pounds in past 12 months), 2) R acute suppurative otitis media, and 3) obstructive sleep apnea.  Plan: 1. Cefdinir 300 mg tabs, 1 tab bid for 10 days to treat ear infection 2. Influenza vaccine given after discussing risks and benefits with mother 3. Advised and supported trial of CPAP to address sleep apnea 4. Weight gain seems most likely secondary to medication caused metabolic disturbance 5. Labs (obesity panel); Hgb A1c, fasting lipid panel, fasting insulin, thyroid panel, CMP 6. When lab results are available, will consider next steps, unlikely that Dr. Sharene Skeans will want to change medications (understandable) given how difficult it has been to control Lemond's seizures over time.  May have to consult with Endocrinology if he is developing insulin resistance, may want to consider Metformin, though will also need to consider medication interactions. 7. Completed school and day care program paper work with mother  Total time = 45 minutes, >50% face to face

## 2014-02-02 ENCOUNTER — Other Ambulatory Visit: Payer: Self-pay | Admitting: *Deleted

## 2014-02-02 DIAGNOSIS — G40309 Generalized idiopathic epilepsy and epileptic syndromes, not intractable, without status epilepticus: Secondary | ICD-10-CM

## 2014-02-02 MED ORDER — LEVETIRACETAM 1000 MG PO TABS: ORAL_TABLET | ORAL | Status: DC

## 2014-02-03 LAB — LIPID PANEL
CHOL/HDL RATIO: 2.6 ratio
Cholesterol: 188 mg/dL (ref 0–200)
HDL: 72 mg/dL (ref >39)
LDL Cholesterol: 106 mg/dL — ABNORMAL HIGH (ref 0–99)
Triglycerides: 51 mg/dL (ref <150)
VLDL: 10 mg/dL (ref 0–40)

## 2014-02-03 LAB — THYROID PANEL
FREE T4: 0.91 ng/dL (ref 0.80–1.80)
T3 Uptake: 26 % (ref 22.0–35.0)
T4 TOTAL: 5.9 ug/dL (ref 4.5–12.0)
TSH: 0.666 u[IU]/mL (ref 0.350–4.500)

## 2014-02-03 LAB — COMPREHENSIVE METABOLIC PANEL
ALK PHOS: 50 U/L (ref 39–117)
ALT: 19 U/L (ref 0–53)
AST: 15 U/L (ref 0–37)
Albumin: 4.1 g/dL (ref 3.5–5.2)
BUN: 12 mg/dL (ref 6–23)
CO2: 31 mEq/L (ref 19–32)
Calcium: 8.8 mg/dL (ref 8.4–10.5)
Chloride: 105 mEq/L (ref 96–112)
Creat: 0.77 mg/dL (ref 0.50–1.35)
Glucose, Bld: 86 mg/dL (ref 70–99)
Potassium: 3.9 mEq/L (ref 3.5–5.3)
SODIUM: 143 meq/L (ref 135–145)
Total Bilirubin: 0.5 mg/dL (ref 0.2–1.2)
Total Protein: 6.3 g/dL (ref 6.0–8.3)

## 2014-02-03 LAB — HEMOGLOBIN A1C
Hgb A1c MFr Bld: 5.6 % (ref <5.7)
MEAN PLASMA GLUCOSE: 114 mg/dL (ref <117)

## 2014-02-03 LAB — INSULIN, FASTING: Insulin fasting, serum: 4 u[IU]/mL (ref 2.0–19.6)

## 2014-02-11 ENCOUNTER — Ambulatory Visit (INDEPENDENT_AMBULATORY_CARE_PROVIDER_SITE_OTHER): Payer: Medicaid Other | Admitting: Neurology

## 2014-02-11 ENCOUNTER — Encounter: Payer: Self-pay | Admitting: Neurology

## 2014-02-11 VITALS — BP 124/86 | HR 84 | Resp 18

## 2014-02-11 DIAGNOSIS — Z5181 Encounter for therapeutic drug level monitoring: Secondary | ICD-10-CM

## 2014-02-11 DIAGNOSIS — Z9689 Presence of other specified functional implants: Secondary | ICD-10-CM

## 2014-02-11 DIAGNOSIS — G4733 Obstructive sleep apnea (adult) (pediatric): Secondary | ICD-10-CM | POA: Insufficient documentation

## 2014-02-11 DIAGNOSIS — G4731 Primary central sleep apnea: Secondary | ICD-10-CM

## 2014-02-11 DIAGNOSIS — Z9889 Other specified postprocedural states: Secondary | ICD-10-CM

## 2014-02-11 DIAGNOSIS — G4739 Other sleep apnea: Secondary | ICD-10-CM

## 2014-02-11 DIAGNOSIS — G4737 Central sleep apnea in conditions classified elsewhere: Secondary | ICD-10-CM

## 2014-02-11 HISTORY — DX: Primary central sleep apnea: G47.31

## 2014-02-11 HISTORY — DX: Other sleep apnea: G47.39

## 2014-02-11 NOTE — Progress Notes (Signed)
[facility]  Provider:  Melvyn Novas, M D  Referring Provider: Preston Fleeting, MD Primary Care Physician:  Ferman Hamming, MD  MRDD, CP and witnessed apnea.   HPI:  Zachary [name] is a 23 y.o. male with MRDD, and newly diagnosed severe sleep apnea, mostly obstructive but some central apnea.  The patient was diagnosed and a sleep study under the guidance of [facility] on 12-20-13. The patient tolerated for a very brief period of time been placed on CPAP and he discussed today if it is possible for him to desensitize although a period at all. His mother is very optimistic that this can be done and that Christapher would adjust using a CPAP gradually of a period of time. This portal a H. I doing a sleep study was 39.6 RDI was 43.6 oxygen desaturation was very brief in total only 3.6 minutes nadir was 86% which is not critically low. Heart rate was regular normal sinus rhythm. The left and elected not discharged doing this titration study.  I will order for Zachary [name] a total CPAP with a setting between 5 and 10 cm water pressure a dual not want a higher upper limit of pressures was at [employer] apneas should be induced. In support of my decision is his mother's ability to teach him to use the machine and she has recorded a support of the family system as well. The patient continues to be extremely fatigued his mother endorsed  the FSS at 46 points on his behalf, and the modified Epworth score at 9/18  points. The patient is not able to drive and does not drink alcohol the study had to be adjusted to 18 possible points.      Last seen on 11-09-13 as a referral from Dr.Hickling at [facility] and Dr. Ane Payment for an evaluation of possible sleep apnea. CD  Mubashir Mallek happy in an incident when he started being followed by Dr. Williemae Area is his primary pediatrician he has no after Dr. Roxy Cedar retirement been followed by Dr. Ane Payment. His peak pediatric neurologist has  always been Dr. Sharene Skeans. 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 He also has traits of autism, osteopenia and he has survived a MRSA infection.  Years ago he was evaluated for possible nocturnal seizures with a specifically arranged mom parch in his sleep study. Only one obstructive apnea had been told at the time and 14 shallow breathing spells. The AHI was 5.0 and the RDI 8.0 he also had no oxygen desaturations at that night. Diaphoresis was noted which may be changes. This mother now reports that he has very fragmented sleep which is not necessarily new but she has witnessed him to gasp for air and stopped breathing. This alerted his pediatrician to request a repeat sleep study. Each time the patient still has a breakthrough seizure , he will be very sleepy for about 30 minutes afterwards . He snores loudly.  He just recently had an ear infection and had several seizures before antibiotics were started.    Review of Systems: Out of a complete 14 system review, the patient complains of only the following symptoms, and all other reviewed systems are negative. MRDD, non verbal but vocal.  Wheelchair, but he can walk- his mother restraints him to keep him from walking off, he is 24 hours total care. He lives behind closed doors.  HST is preferred to check for  apnea.  VNS settings accessed in Dr Gerald Leitz last epic notes.   History   Social History  . Marital Status: Single    Spouse Name: N/A    Number of Children: N/A  . Years of Education: N/A   Occupational History  . Not on file.   Social History Main Topics  . Smoking status: Never Smoker   . Smokeless tobacco: Never Used  . Alcohol Use: No  . Drug Use: No  . Sexual Activity: No   Other Topics Concern  . Not on file   Social History Narrative   Patient lives with his mother.    Family History  Problem Relation Age of Onset  . Cancer Paternal Grandmother      Died in her 30's  . Diabetes Paternal Grandfather     Died in his 8's     Past Medical History  Diagnosis Date  . Seizures   . MR (mental retardation)   . CP (cerebral palsy)   . Hypertension   . Fracture of femur, intertrochanteric, closed   . Recurrent apnea   . 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 .     Past Surgical History  Procedure Laterality Date  . Hip surgery    . Foot surgery    . Implantation vagal nerve stimulator    . Tympanostomy tube placement  2009  . Tonsillectomy  2009  . Pediatomy  2009  . Fracture surgery      Current Outpatient Prescriptions  Medication Sig Dispense Refill  . amLODipine (NORVASC) 5 MG tablet TAKE ONE TABLET TWICE DAILY  60 tablet  2  . cetirizine (ZYRTEC) 10 MG tablet Take 1 tablet (10 mg total) by mouth daily as needed for allergies.  30 tablet  12  . DIASTAT ACUDIAL 20 MG GEL Give 15 mg rectally after 2 minutes of seizure.  2 Package  5  . hydrochlorothiazide (MICROZIDE) 12.5 MG capsule Take 12.5 mg by mouth as needed (Only gives this once or twice a month if ankles swell. Monitor BP at home).      Marland Kitchen levETIRAcetam (KEPPRA) 1000 MG tablet Take 1+1/2 tablets twice per day BRAND MEDICALLY NECESSARY  93 tablet  1  . ondansetron (ZOFRAN-ODT) 8 MG disintegrating tablet Take 1 tablet (8 mg total) by mouth every 8 (eight) hours as needed for nausea or vomiting.  20 tablet  0  . OXTELLAR XR 600 MG TB24 Take 3 tablets at bedtime  93 tablet  5  . polyethylene glycol (MIRALAX / GLYCOLAX) packet Take 17 g by mouth daily. Can increase to 4 capfuls a day if needed      . VIMPAT 100 MG TABS Take 1 tab by mouth in the morning and 1 tab at 3pm  60 tablet  5  . VIMPAT 200 MG TABS tablet Take 2 tabs by mouth at bedtime  60 tablet  5   No current facility-administered medications for this visit.    Allergies as of 02/11/2014 - Review Complete  02/11/2014  Allergen Reaction Noted  . Depakote [divalproex sodium] Other (See Comments) 08/26/2012  . Lyrica [pregabalin] Other (See Comments) 08/26/2012  . Penicillins  06/25/2011  . Topamax [topiramate] Other (See Comments) 08/26/2012    Vitals: BP 124/86  Pulse 84  Resp 18 Last Weight:  Wt Readings from Last 1 Encounters:  01/29/14 161 lb 12.8 oz (73.392 kg)  Last Height:   Ht Readings from Last 1 Encounters:  01/29/14 5' 0.5" (1.537 m)   Physical exam:  General: The patient is awake, alert and appears not in acute distress. The patient is well groomed. Head: abnormal brachycephalic - Neck is supple. Mallampati 3 , neck circumference: 17  Inches, short statue.  Cardiovascular:  Regular rate and rhythm , without  murmurs or carotid bruit, and without distended neck veins. Respiratory: Lungs are clear to auscultation. Skin:  Ankles edematously changed , no rash Trunk: BMI is elevated - patient has a distended abdomen.   Neurologic exam : MRDD, alert , but non verbal.  Cranial nerves: Pupils are equal, 3 mm and non-reactive to light. EOM not tested.   Facial motor strength is symmetric and tongue and uvula move midline. Peripheral vision loss,  disturbed stereognosis,   Motor exam:  Elevated tone and decreased  Voluntary motor  strength in all extremities.  Sensory:  Fine touch, pinprick and vibration were tested in all extremities- Ayham withdrew.  Proprioception is not tested.  Coordination: Rapid alternating movements in the fingers/hands was not performed.   same with Finger-to-nose maneuver   Gait and station: Patient walks with assistance and without but is difficult to guide.  Deep tendon reflexes: in the upper and lower extremities are brisk .  The right hand is fisted  The left arm extended.    Assessment:  After physical and neurologic examination, review of laboratory studies, imaging, neurophysiology testing and pre-existing records, assessment is  1)  this patient  Has severe  Apnea without major oxygen desaturation.  Plan:  Treatment plan and additional workup :  2) Patient will be prescribed CPAP auto set 5-10 cm water with 3 cm flex function for 90 days. Mask should be nasal pillow, P10 .

## 2014-02-11 NOTE — Patient Instructions (Signed)

## 2014-02-16 ENCOUNTER — Telehealth: Payer: Self-pay | Admitting: Neurology

## 2014-02-16 DIAGNOSIS — F71 Moderate intellectual disabilities: Secondary | ICD-10-CM

## 2014-02-16 DIAGNOSIS — R0902 Hypoxemia: Secondary | ICD-10-CM

## 2014-02-18 NOTE — Telephone Encounter (Signed)
Forwarded to [employer].

## 2014-03-03 ENCOUNTER — Telehealth: Payer: Self-pay | Admitting: Pediatrics

## 2014-03-03 NOTE — Telephone Encounter (Signed)
Mom called and Zachary [name] is congested and cough, with his seizures She is wanting to know what can he take for his congestion and cough. You have told her before but she forgot.

## 2014-03-04 ENCOUNTER — Ambulatory Visit (INDEPENDENT_AMBULATORY_CARE_PROVIDER_SITE_OTHER): Payer: Medicaid Other | Admitting: Pediatrics

## 2014-03-04 DIAGNOSIS — T17908A Unspecified foreign body in respiratory tract, part unspecified causing other injury, initial encounter: Secondary | ICD-10-CM

## 2014-03-04 DIAGNOSIS — T17998A Other foreign object in respiratory tract, part unspecified causing other injury, initial encounter: Secondary | ICD-10-CM

## 2014-03-04 DIAGNOSIS — H66015 Acute suppurative otitis media with spontaneous rupture of ear drum, recurrent, left ear: Secondary | ICD-10-CM

## 2014-03-04 MED ORDER — CLINDAMYCIN HCL 150 MG PO CAPS: 450.0000 mg | ORAL_CAPSULE | Freq: Three times a day (TID) | ORAL | Status: AC

## 2014-03-04 NOTE — Progress Notes (Signed)
Subjective:     Patient ID: Zachary [name], male   DOB: Oct 29, 1990, [age] y.o.   MRN: [id number]  HPI When spoke last night had not yet spiked a fever Also, began to vomit, mother concerned that he may have aspirated, began to cough and "wheeze" Poor appetite, have to trick him to get him to drink Urine output is down Poops okay, "sticky," "came on its own" Had break through seizure yesterday on the school bus  Seizure first (had been seizure free for about 1 month) Vomiting Coughing, could not breathe; seemed to reflux during this time Vomiting and diarrhea (sticky) Fever to 101 (axillary), about an hour later  Ear drainage, fungal, two weeks ago, thought to be fungal, ears had been cleaned and treated with powder and drops Drainage seems to have stopped until day before yesterday, now smells and has "goo" and drainage  Has ENT visit later today, planning on cleaning ears out again  Review of Systems See HPI    Objective:   Physical Exam R ear: pus in canal, pus behind TM, tubes in place Lungs: clear    Assessment:     23 year old AAM with chronic problems of severe autism, recurrent ear infections with tubes in place (most recently determines to have fungal infection), now with concern for ear infection and possible aspiration.    Plan:     1. Clindamycin 450 mg tid for 7 days, to treat for aspiration pneumonia (though lungs currently clear, mother reports fever and difficulty breathing last night following event in which he coughed and gagged and seemed to aspirate secretions, in patient at increased risk for aspiration) 2. ENT appointment later today, asked mother to inquire about possibility of getting a sample of ear discharge for culture, will guide further treatment for ear infection. 3. Follow-up as needed

## 2014-03-08 ENCOUNTER — Telehealth: Payer: Self-pay

## 2014-03-08 NOTE — Telephone Encounter (Signed)
Mom called to let you know Dr Lazarus SalinesWolicki did a culture on Zachary [name]'s ear and he does have MRSA in his ear.  Dr Lazarus SalinesWolicki started Zachary [name] on some ear drops and said the antibiotic he was on would not help him.  Mom wants you to call her and see if Zachary [name] needs to start a different antibiotic.

## 2014-03-16 ENCOUNTER — Other Ambulatory Visit: Payer: Self-pay | Admitting: Pediatrics

## 2014-03-23 ENCOUNTER — Encounter: Payer: Self-pay | Admitting: Neurology

## 2014-04-01 ENCOUNTER — Ambulatory Visit: Payer: Medicaid Other | Admitting: Pediatrics

## 2014-04-08 ENCOUNTER — Other Ambulatory Visit: Payer: Self-pay | Admitting: Family

## 2014-04-08 DIAGNOSIS — G40319 Generalized idiopathic epilepsy and epileptic syndromes, intractable, without status epilepticus: Secondary | ICD-10-CM

## 2014-04-08 MED ORDER — KEPPRA 1000 MG PO TABS: ORAL_TABLET | ORAL | Status: DC

## 2014-04-21 ENCOUNTER — Ambulatory Visit (INDEPENDENT_AMBULATORY_CARE_PROVIDER_SITE_OTHER): Payer: Medicaid Other | Admitting: Pediatrics

## 2014-04-21 ENCOUNTER — Encounter: Payer: Self-pay | Admitting: Pediatrics

## 2014-04-21 VITALS — BP 98/64 | HR 72 | Ht 61.0 in | Wt 164.0 lb

## 2014-04-21 DIAGNOSIS — F840 Autistic disorder: Secondary | ICD-10-CM

## 2014-04-21 DIAGNOSIS — G808 Other cerebral palsy: Secondary | ICD-10-CM

## 2014-04-21 DIAGNOSIS — G40319 Generalized idiopathic epilepsy and epileptic syndromes, intractable, without status epilepticus: Secondary | ICD-10-CM

## 2014-04-21 DIAGNOSIS — G4730 Sleep apnea, unspecified: Secondary | ICD-10-CM

## 2014-04-21 DIAGNOSIS — G40311 Generalized idiopathic epilepsy and epileptic syndromes, intractable, with status epilepticus: Secondary | ICD-10-CM

## 2014-04-21 DIAGNOSIS — G40209 Localization-related (focal) (partial) symptomatic epilepsy and epileptic syndromes with complex partial seizures, not intractable, without status epilepticus: Secondary | ICD-10-CM | POA: Insufficient documentation

## 2014-04-21 MED ORDER — DIASTAT ACUDIAL 20 MG RE GEL: RECTAL | Status: DC

## 2014-04-21 NOTE — Progress Notes (Signed)
Patient: Zachary [name] MRN: [id number] Sex: male DOB: [birth year]  Provider: Deetta PerlaHICKLING,WILLIAM H, MD [facility]  Note type: Routine return visit  History of Present Illness: Referral Source: Dr. Ferman HammingJames Hooker  History from: [facility] and [facility] Chief Complaint: Seizures   Zachary [name] is a 23 y.o. male who was evaluated on April 21, 2014 for the first time since December 30, 2013.  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  He takes four antiepileptic medications including vagal nerve stimulator.  He has significant complex sleep apnea with episodes of desaturation and mild snoring.  It turns out that his vagal nerve stimulator is also part of this.  The episodes of apnea caused by VNS were not prolonged enough to cause desaturation or arousal.  I cut back his frequency of stimulation because of this.  Fortunately through the work of [facility], a CPAP device has been found that he can wear.  He is sleeping much better.  When he wakes up and is rested, he will try to push the mask off, but his mother is usually able to get it back on.  With this, both are sleeping better, he is more alert and interactive, and he had no seizures in six weeks.  His health has been good.  His vagal nerve stimulator was interrogated today and continues to work well.  I further increased the time between stimuli and doubled the duration of stimulus.  This should draw less current than most recent programming.  I am trying to move this back to a middle level of stimulation which is effective in many patients and preserves battery life.  Procedure: interrogation of vagal nerve stimulator  The implanted device is series 102, serial [id number], implanted May 23, 2011.  On interrogation: amplitude 2.50 mA, frequency 20 Hz, pulse  width 250 s, stimulation duration 7 seconds, stimulation interval 0.5 minutes, magnet amplitude 2.50 mA, magnet stimulation duration 30 seconds, magnet pulse width 250 s.  Number of stimuli since last visit is 31; total number since implantation: 532.  Normal diagnostics with an amplitude of 2.50 milliamps again showed intact communication, output, and impedance. DC/ DC conversion code was 3. There is no indication to change the battery.  I reprogrammed the device to stimulate every 1.1 minutes. I increased the stimulus to 14 seconds in duration.  Review of Systems: 12 system review was unremarkable  Past Medical History Diagnosis Date  . Seizures   . MR (mental retardation)   . CP (cerebral palsy)   . Hypertension   . Fracture of femur, intertrochanteric, closed   . Recurrent apnea   . Status post VNS (vagus nerve stimulator) placement 11/09/2013    Placed in 05/23/11, Dr Sharene SkeansHickling follows the settings and his seizure disorder. .   . 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 Theressa StampsJames Crowe. Patient's AHI was 39.6 RDI is 43.6 positional component. Patient will need desensitization and a gentle approach to CPAP titration.    Hospitalizations: No., Head Injury: No., Nervous System Infections: No., Immunizations up to date: Yes.    I began to see him on July 10, 2004. He apparently was seen by me when he was eight months of age with developmental delay. Seizure activity; however, did not began until he is 14, two days after starting amitriptyline. 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 His most recent CT scan of the brain was on September 29, 2009, and showed no significant changes.  EEG showed diffuse background slowing.   He was treated with Depakote, Topamax, and Lyrica, but suffered  side effects and agitation from those medications. He has taken Trileptal, Keppra, and Vimpat for quite some time. He was seen at [facility] on Oct 27, 2010 for 6 days.for a phase 1 evaluation. MRI of the brain showed ex-vacuo dilatation of his ventricles and cortical atrophy. Volume loss most pronounced in the parietal lobes bilaterally, temporal lobes with widening of the sylvian fissures and a paucity of white matter diffusely most pronounced again in the parietal lobes with patchy T2 hyperintensity. 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 There was no evidence of mesial temporal sclerosis.  Magnetoencephalogram showed epileptiform activity arising from the left posterior temple region with a field extending to the occipital lobe. This was not seen with concurrent routine EEG.  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 This was somewhat worse in the right than the left bilateral temporal cortex and temporal pole suggesting a longstanding insult.  Prolonged video EEG showed onset of bifrontal slowing that evolved into rhythmic activity with spikes of greater amplitude over the right central region with secondary generalization. Clinically, the patient has onset of a myoclonic jerk with his head turned to the left, movement of the left arm and leg, then tonic posturing followed by tonic-clonic generalized movements.  One day later the patient had bifrontal rhythmic beta range activity followed by decrement in voltage with fast beta range activity for about 30 seconds and then generalized rhythmic beta range activity. The patient had a slight head bob slowly lowered to the bed with stiffening of both upper extremities and tonic-clonic movement of both upper extremities followed by secondary generalization in his legs. A total of  seven seizures were monitored. The last three were obscured by electrode artifact. Seizure onset appeared to occur from both frontal regions. This led to the recommendations to place the vagal nerve stimulator.  .  He has frequent ear infections, urinary tract infections, constipation and a hip fracture. He has had problems with neutropenia and thrombocytopenia. He had some vomiting in June, 2010 and developed aspiration pneumonia. He tends to have a temperature of 99 in the late evening. He has had several episodes in which his left knee has been dislocated. This happened as recently as August 28, 2009. He sat down in a chair and his knee went out of place. EMS was called and he was taken to the ER. While waiting for the ER physician, [place] stretched and his knee went back in to place. His mother said that he was subsequently seen by his orthopedist, who thinks that he may eventually need surgery on the knee.   Behavior History none  Surgical History Procedure Laterality Date  . Hip surgery    . Foot surgery    . Implantation vagal nerve stimulator    . Tympanostomy tube placement  2009  . Tonsillectomy  2009  . Pediatomy  2009  . Fracture surgery     Family History family history includes Cancer in his paternal grandmother; Diabetes in his paternal  grandfather. Family history is negative for migraines, seizures, intellectual disabilities, blindness, deafness, birth defects, chromosomal disorder, or autism.  Social History . Marital Status: Single    Spouse Name: N/A    Number of Children: N/A  . Years of Education: N/A   Social History Main Topics  . Smoking status: Never Smoker   . Smokeless tobacco: Never Used  . Alcohol Use: No  . Drug Use: No  . Sexual Activity: No   Social History Narrative  Educational level special education Living with mother, siblings and maternal grandmother   Hobbies/Interest: Loves riding in the car School comments Ivey completed his education in  2013 at [employer] and he now attends [employer].  Allergies Allergen Reactions  . Depakote [Divalproex Sodium] Other (See Comments)    Low Platelets  . Lyrica [Pregabalin] Other (See Comments)    Sleepiness  . Penicillins   . Topamax [Topiramate] Other (See Comments)    Weight Loss   Physical Exam BP 98/64 mmHg  Pulse 72  Ht 5\' 1"  (1.549 m)  Wt 164 lb (74.39 kg)  BMI 31.00 kg/m2  General: well developed, well nourished young man, seated in wheelchair in exam room.  Head: head normocephalic and atraumatic.  Ears, Nose and Throat: oropharynx benign. 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  Neck: supple with no carotid or supraclavicular bruits.  Respiratory: lung clear to auscultation.  Cardiovascular: regular rate and rhythm, no murmurs; he has edema in both feet  Skin: facial acne. He has well healed surgical scars on his left neck and his left anterior chest from the VNS  Trunk: truncal obesity  Neurologic Exam  Mental Status: Awake and alert. He does not make eye contact. He does not speak. He resists some efforts at examination. He is unable to follow commands. He has very limited language.  Cranial Nerves: Fundoscopic exam reveals poorly visualized disc margins. Pupils equal, briskly reactive to light. Extraocular movements bilateral exotropia without nystagmus; disconjugate gaze with amblyopia on the right. He does not regard objects in the left visual field as well as the right, he has difficulties judging distance when coordinating movements. Hearing appears intact as he turns to localize sounds. Neck flexion and extension normal after VNS was implanted .  Motor: Normal bulk with increased tone in all extremities. decreased strength with formal strength testing in all extremities. wheelchair for transport . Clumsy fine motor movements. Left arm spastic hemiparesis with flexion contracture at the elbow and a claw  hand deformity.  Sensory: Intact to withdrawal x4.  Coordination: Poor quality fine motor movements.  Gait and Station: Arises from wheelchair with mild difficulty. Stance is narrow based. Gait demonstrates diplegic gait that is slow with shortened stride length. Varus position of his feet . He walked fairly well today without any assistance.  Reflexes: Diminished and symmetric. Toes downgoing. He had no clonus today.  Assessment 1. 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 2. Complex partial seizures involving to generalized tonic-clonic seizures, G40.209. 3. 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 5. Autism spectrum disorder with accompanying intellectual impairment and language impairment requiring very substantial support (level 3), F84.0.  Discussion Zachary [name] is doing the best than he has in quite some time.  No small part related to sleeping better.  Control of his sleep apnea has come improved control in his seizures.  This is going to allow me to cut back his vagal nerve stimulator, hopefully without recurrent seizures.  If that works, we may be able to simplify the antiepileptic medications that he takes.    Plan I reprogrammed his vagal nerve stimulator and spent 30 minutes of face-to-face time with Zachary RuizJohn and his mother, more than half of it in consultation.   Medication List   This list is accurate as of: 04/21/14 10:57 AM.       amLODipine 5 MG tablet  Commonly known as:  NORVASC  TAKE ONE TABLET TWICE DAILY     cetirizine 10 MG tablet  Commonly known as:  ZYRTEC  Take 1 tablet (10 mg total) by mouth daily as needed for allergies.     DIASTAT ACUDIAL 20 MG Gel  Generic drug:  diazepam  Give 15 mg rectally after 2 minutes of seizure.     hydrochlorothiazide 12.5 MG capsule  Commonly known as:  MICROZIDE  Take 12.5 mg by mouth as needed (Only gives this once or twice a month if ankles swell. Monitor BP at home).      KEPPRA 1000 MG tablet  Generic drug:  levETIRAcetam  Take 1 and 1/2 tablets twice per day     ondansetron 8 MG disintegrating tablet  Commonly known as:  ZOFRAN-ODT  Take 1 tablet (8 mg total) by mouth every 8 (eight) hours as needed for nausea or vomiting.     OXTELLAR XR 600 MG Tb24  Generic drug:  OXcarbazepine ER  Take 3 tablets at bedtime     polyethylene glycol packet  Commonly known as:  MIRALAX / GLYCOLAX  Take 17 g by mouth daily. Can increase to 4 capfuls a day if needed     VIMPAT 200 MG Tabs tablet  Generic drug:  lacosamide  Take 2 tabs by mouth at bedtime     VIMPAT 100 MG Tabs  Generic drug:  Lacosamide  Take 1 tab by mouth in the morning and 1 tab at 3pm      The medication list was reviewed and reconciled. All changes or newly prescribed medications were explained.  A complete medication list was provided to the patient/caregiver.  Deetta PerlaWilliam H Hickling MD

## 2014-04-23 ENCOUNTER — Other Ambulatory Visit: Payer: Self-pay | Admitting: Pediatrics

## 2014-05-03 ENCOUNTER — Ambulatory Visit (INDEPENDENT_AMBULATORY_CARE_PROVIDER_SITE_OTHER): Payer: Medicaid Other | Admitting: Family

## 2014-05-03 ENCOUNTER — Encounter: Payer: Self-pay | Admitting: Family

## 2014-05-03 DIAGNOSIS — G40301 Generalized idiopathic epilepsy and epileptic syndromes, not intractable, with status epilepticus: Secondary | ICD-10-CM

## 2014-05-03 DIAGNOSIS — G40209 Localization-related (focal) (partial) symptomatic epilepsy and epileptic syndromes with complex partial seizures, not intractable, without status epilepticus: Secondary | ICD-10-CM

## 2014-05-03 DIAGNOSIS — G40901 Epilepsy, unspecified, not intractable, with status epilepticus: Secondary | ICD-10-CM

## 2014-05-03 DIAGNOSIS — G40319 Generalized idiopathic epilepsy and epileptic syndromes, intractable, without status epilepticus: Secondary | ICD-10-CM

## 2014-05-03 DIAGNOSIS — G40311 Generalized idiopathic epilepsy and epileptic syndromes, intractable, with status epilepticus: Secondary | ICD-10-CM

## 2014-05-03 MED ORDER — MIDAZOLAM 5 MG/ML PEDIATRIC INJ FOR INTRANASAL/SUBLINGUAL USE: 10.0000 mg | Freq: Once | INTRAMUSCULAR | Status: DC

## 2014-05-03 NOTE — Progress Notes (Signed)
Zachary [name]'s mother came in to learn how to give intranasal Versed at home in the event that he has a prolonged seizure. I reviewed the equipment and the procedure with Mom. She practiced drawing up fluid using normal saline and practiced technique using a doll. She was instructed to call 911 if she has any concerns regarding Zachary [name]'s condition if she administers the medication. Mom is aware that respiratory depression is a potential side effect of this medication. She was instructed to call and notify this office if Zachary [name] has a seizure that is longer than 2 minutes and requires administration of Versed. She knows not to give Versed and Diastat in the same day. Mom demonstrated appropriate technique and agreed with instructions.

## 2014-05-03 NOTE — Patient Instructions (Signed)
We have reviewed the use of the medication Midazolam (Versed) in the event that Jonny RuizJohn has a seizure that requires treatment to stop the seizure activity. The following are the instructions for this medication.  For seizures that last 2 minutes or longer, give Midazolam (Versed) nasal spray as follows: Draw up 1ml of medication into 2 syringes. Remove blue vial access device.  Attach syringe to nasal atomizer.  Hold the forehead firmly so that you can steady the head to give the medication in the nostrils.  Give 1ml into each nostril by pushing the plunger of the syringe slowly.  If the seizure stops after giving 1ml, you do not have to give the 2nd syringe of medication, but keep it nearby in case the seizure resumes in the next few moments. If the seizure is continuing, give the 2nd syringe.   Monitor the breathing and continue to watch the seizure. If the breathing becomes shallow or stops, call 911 and attempt to stimulate Gar while waiting on help to arrive. If the seizure stops and sleep begins, simply monitor and let us know that you gave the Versed.   Do not give 2 doses of Versed in 1 day. If more seizures occur in 1 day, Jonny RuizJohn will need to go to the ER.   Do not give Versed and Diastat in the same day.   If some medication trickles from the nose when you give it, that is ok. Because it is a mist, the majority of the medication will have been absorbed.   Please call if you have any questions or concerns. If Jonny RuizJohn has a seizure that requires the administration of Versed, please call the office and let us know.

## 2014-05-11 ENCOUNTER — Telehealth: Payer: Self-pay | Admitting: *Deleted

## 2014-05-11 NOTE — Telephone Encounter (Addendum)
The implanted device is series 102, serial [id number], implanted May 23, 2011.  On interrogation: amplitude 2.50 mA, frequency 20 Hz, pulse width 250 s, stimulation duration 14 seconds, stimulation interval 1.1 minutes, magnet amplitude 2.50 mA, magnet stimulation duration 30 seconds, magnet pulse width 250 s.  Number of stimuli since last visit is 31; total number since implantation: 532. (not tested today) Normal diagnostics with an amplitude of 2.50 milliamps again showed intact communication, output, and impedance. DC/ DC conversion code was 3. There is no indication to change the battery.(not tested today)  Since I changed to this duty cycle on April 21, 2014, the patient had daily seizures when he was having none.  I reprogrammed the device to stimulate every 0.8 minutes. I decreased the stimulus to 7 seconds in duration, the settings that he was on before i make changes.  I'm surprised that his seizure frequency changed so quickly.  I suspect that it has nothing to do with the VNS.  I asked mother to call me in about a week.  No charge for today.

## 2014-05-11 NOTE — Telephone Encounter (Signed)
We will reprogram his vagal nerve stimulator to 0.8 minute intervals and 7 second duration.  Mom will come about 4:30.  Please let Erie NoeVanessa know.

## 2014-05-11 NOTE — Telephone Encounter (Signed)
[name] the patient's mom called and stated that since the patient was seen on 11/18 and his VNS was reprogrammed he has been having seizures daily, he was also seen on 11/30 by [name] for instruction of how to use the nasal Versed for seizure activity, mom has asked that she be called back to discuss his daily seizures multiple times a day. [name] can be reached at [phone].    Thanks,  Belenda CruiseMichelle B.

## 2014-05-11 NOTE — Telephone Encounter (Signed)
I let Erie NoeVanessa know that Zachary [name] is coming at 4:30 pm. MB

## 2014-05-11 NOTE — Telephone Encounter (Signed)
Mom called back and stated she forgot to mention that when Zachary RuizJohn had his seizure this morning at 7:30 am he was standing and fell hitting his head on the kitchen counter and has a small knot on his head, she kept him home today she says that he is laying down now and blinking a lot as if his head hurts, he did eat breakfast this morning. MB

## 2014-05-13 ENCOUNTER — Ambulatory Visit: Payer: Medicaid Other | Admitting: Adult Health

## 2014-05-15 ENCOUNTER — Ambulatory Visit (INDEPENDENT_AMBULATORY_CARE_PROVIDER_SITE_OTHER): Payer: Medicaid Other | Admitting: Pediatrics

## 2014-05-15 VITALS — Wt 168.0 lb

## 2014-05-15 DIAGNOSIS — J069 Acute upper respiratory infection, unspecified: Secondary | ICD-10-CM

## 2014-05-15 DIAGNOSIS — B9789 Other viral agents as the cause of diseases classified elsewhere: Principal | ICD-10-CM

## 2014-05-15 MED ORDER — FLUTICASONE PROPIONATE 50 MCG/ACT NA SUSP: 1.0000 | Freq: Two times a day (BID) | NASAL | Status: DC

## 2014-05-15 NOTE — Progress Notes (Signed)
Subjective:     Patient ID: Zachary AlbertsJohn T [name], male   DOB: 1991-05-22, 23 y.o.   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  HENT:  Right Ear: External ear normal.  Left Ear: External ear normal.  Neck: Normal range of motion. Neck supple.  Cardiovascular: Normal rate, regular rhythm and normal heart sounds.   Pulmonary/Chest: Effort normal and breath sounds normal. No respiratory distress. He has no wheezes. He has no rales.  Lymphadenopathy:    He has no cervical adenopathy.   Assessment:     23 year old AAM with complex and chronic PMH (including sever autism) now with what appears to be viral URI with cough (no findings or history suggestive of bacterial infection).    Plan:     1. Discussed supportive care, including Flonase for nasal congestion 2. Continue to observe closely for any changes in condition 3. Follow-up as needed

## 2014-05-18 ENCOUNTER — Telehealth: Payer: Self-pay | Admitting: Pediatrics

## 2014-05-18 ENCOUNTER — Other Ambulatory Visit: Payer: Self-pay | Admitting: Pediatrics

## 2014-05-18 MED ORDER — AZITHROMYCIN 250 MG PO TABS: 250.0000 mg | ORAL_TABLET | Freq: Every day | ORAL | Status: DC

## 2014-05-18 MED ORDER — ALBUTEROL SULFATE (2.5 MG/3ML) 0.083% IN NEBU: 2.5000 mg | INHALATION_SOLUTION | Freq: Four times a day (QID) | RESPIRATORY_TRACT | Status: DC | PRN

## 2014-05-18 NOTE — Telephone Encounter (Signed)
Mom needs to talk to you about Obe's cough

## 2014-05-19 ENCOUNTER — Ambulatory Visit (INDEPENDENT_AMBULATORY_CARE_PROVIDER_SITE_OTHER): Payer: Medicaid Other | Admitting: Pediatrics

## 2014-05-19 VITALS — Wt 168.0 lb

## 2014-05-19 DIAGNOSIS — B9789 Other viral agents as the cause of diseases classified elsewhere: Principal | ICD-10-CM

## 2014-05-19 DIAGNOSIS — J069 Acute upper respiratory infection, unspecified: Secondary | ICD-10-CM

## 2014-05-19 NOTE — Progress Notes (Signed)
Subjective:     Patient ID: Zachary [name], male   DOB: 1990/06/28, 23 y.o.   MRN: [id number]  HPI Some improvement in past 24 hours after adding Albuterol and azithromycin Coughing continues, seems worse when he lays down and when he first gets up in the morning Coughing continues, seems to breathe in after coughing fit Worse when laying down, seems better when upright Has continued to eat okay, well for lunch No fever  Review of Systems  Constitutional: Positive for appetite change. Negative for fever and activity change.  HENT: Positive for postnasal drip. Negative for ear discharge, ear pain and sore throat.   Respiratory: Positive for cough. Negative for choking and shortness of breath.   Gastrointestinal: Negative.    Objective:   Physical Exam  Constitutional: He appears well-developed. No distress.  Cardiovascular: Normal rate, regular rhythm and normal heart sounds.   No murmur heard. Pulmonary/Chest: Effort normal and breath sounds normal. No respiratory distress. He has no wheezes. He has no rales.   Assessment:     23 year old AAM with severe autism spectrum disorder and other complex and chronic medical problems, now with improvement in apparent community acquired pneumonia versus viral URI with cough    Plan:     1. Continue full course of azithromycin 2. Only use Albuterol as needed, if he has coughing that seems more severe than current 3. Routine supportive care discussed 4. Follow-up as needed

## 2014-06-19 ENCOUNTER — Encounter: Payer: Self-pay | Admitting: Neurology

## 2014-06-25 ENCOUNTER — Encounter (HOSPITAL_COMMUNITY): Payer: Self-pay | Admitting: Emergency Medicine

## 2014-06-25 ENCOUNTER — Emergency Department (HOSPITAL_COMMUNITY)
Admission: EM | Admit: 2014-06-25 | Discharge: 2014-06-25 | Disposition: A | Discharge status: Home / Self Care | Payer: Medicaid Other | Attending: Emergency Medicine | Admitting: Emergency Medicine

## 2014-06-25 DIAGNOSIS — Z792 Long term (current) use of antibiotics: Secondary | ICD-10-CM | POA: Insufficient documentation

## 2014-06-25 DIAGNOSIS — Z7951 Long term (current) use of inhaled steroids: Secondary | ICD-10-CM | POA: Diagnosis not present

## 2014-06-25 DIAGNOSIS — F99 Mental disorder, not otherwise specified: Secondary | ICD-10-CM | POA: Diagnosis not present

## 2014-06-25 DIAGNOSIS — I10 Essential (primary) hypertension: Secondary | ICD-10-CM | POA: Insufficient documentation

## 2014-06-25 DIAGNOSIS — R569 Unspecified convulsions: Secondary | ICD-10-CM

## 2014-06-25 DIAGNOSIS — Z79899 Other long term (current) drug therapy: Secondary | ICD-10-CM | POA: Insufficient documentation

## 2014-06-25 DIAGNOSIS — G40909 Epilepsy, unspecified, not intractable, without status epilepticus: Secondary | ICD-10-CM | POA: Insufficient documentation

## 2014-06-25 DIAGNOSIS — Z8781 Personal history of (healed) traumatic fracture: Secondary | ICD-10-CM | POA: Diagnosis not present

## 2014-06-25 DIAGNOSIS — Z880 Allergy status to penicillin: Secondary | ICD-10-CM | POA: Insufficient documentation

## 2014-06-25 LAB — I-STAT CHEM 8, ED
BUN: 16 mg/dL (ref 6–23)
CHLORIDE: 103 mmol/L (ref 96–112)
CREATININE: 0.6 mg/dL (ref 0.50–1.35)
Calcium, Ion: 0.96 mmol/L — ABNORMAL LOW (ref 1.12–1.23)
Glucose, Bld: 141 mg/dL — ABNORMAL HIGH (ref 70–99)
HCT: 44 % (ref 39.0–52.0)
HEMOGLOBIN: 15 g/dL (ref 13.0–17.0)
POTASSIUM: 5.6 mmol/L — AB (ref 3.5–5.1)
SODIUM: 134 mmol/L — AB (ref 135–145)
TCO2: 24 mmol/L (ref 0–100)

## 2014-06-25 MED ORDER — LACOSAMIDE 200 MG/20ML IV SOLN
1.0000 mg/kg | Freq: Two times a day (BID) | INTRAVENOUS | Status: DC
  Filled 2014-06-25: qty 7.44

## 2014-06-25 MED ORDER — LORAZEPAM 2 MG/ML IJ SOLN
1.0000 mg | Freq: Once | INTRAMUSCULAR | Status: AC
  Administered 2014-06-25: 1 mg via INTRAMUSCULAR
  Filled 2014-06-25: qty 1

## 2014-06-25 MED ORDER — LEVETIRACETAM IN NACL 1500 MG/100ML IV SOLN
1500.0000 mg | Freq: Once | INTRAVENOUS | Status: DC
  Filled 2014-06-25: qty 100

## 2014-06-25 MED ORDER — SODIUM CHLORIDE 0.9 % IV BOLUS (SEPSIS)
500.0000 mL | Freq: Once | INTRAVENOUS | Status: AC
  Administered 2014-06-25: 500 mL via INTRAVENOUS

## 2014-06-25 MED ORDER — SODIUM CHLORIDE 0.9 % IV SOLN
INTRAVENOUS | Status: DC
Start: 2014-06-25 — End: 2014-06-25
  Administered 2014-06-25: 17:00:00 via INTRAVENOUS

## 2014-06-25 MED ORDER — ONDANSETRON 8 MG PO TBDP
8.0000 mg | ORAL_TABLET | Freq: Once | ORAL | Status: AC
  Administered 2014-06-25: 8 mg via ORAL
  Filled 2014-06-25: qty 1

## 2014-06-25 MED ORDER — SODIUM CHLORIDE 0.9 % IV BOLUS (SEPSIS): 500.0000 mL | Freq: Once | INTRAVENOUS | Status: DC

## 2014-06-25 MED ORDER — SODIUM CHLORIDE 0.9 % IV SOLN
150.0000 mg | Freq: Two times a day (BID) | INTRAVENOUS | Status: DC
  Administered 2014-06-25: 150 mg via INTRAVENOUS
  Filled 2014-06-25 (×2): qty 15

## 2014-06-25 MED ORDER — LORAZEPAM 2 MG/ML IJ SOLN
1.0000 mg | Freq: Once | INTRAMUSCULAR | Status: DC
  Filled 2014-06-25: qty 1

## 2014-06-25 MED ORDER — LORAZEPAM 1 MG PO TABS
1.0000 mg | ORAL_TABLET | Freq: Once | ORAL | Status: DC
  Filled 2014-06-25: qty 1

## 2014-06-25 NOTE — ED Notes (Addendum)
Dr. Effie ShyWentz updated, pt vomited small amt liquid.  Verbal orders rec'd at this time.  Per Dr. Effie ShyWentz ok to hold on potassium redraw at this time as pt is a very difficult stick and initial blood draw was slow.

## 2014-06-25 NOTE — ED Notes (Signed)
IVT at bedside.

## 2014-06-25 NOTE — ED Notes (Signed)
No further episodes of vomiting, however pt remains with intermittent small shaking movements of arms.

## 2014-06-25 NOTE — ED Notes (Signed)
HOLD ON BLOOD PER DR Effie ShyWENTZ

## 2014-06-25 NOTE — ED Notes (Signed)
PER EMS - pt from home with hx of sz with witnessed sz this AM, no fall or injuries, lasting approx 3-4 mins, with 30 mins post-ictal period per EMS.  Pt with MR and at baseline mentation on arrival to ED.

## 2014-06-25 NOTE — ED Notes (Signed)
Pt provided graham crackers , peanut butter, and beverage. Pts mother remains at bedside.

## 2014-06-25 NOTE — ED Notes (Addendum)
2nd RN attempted IV access unsuccessfully, Dr. Effie ShyWentz at bedside and aware.

## 2014-06-25 NOTE — ED Notes (Signed)
Bed: JX91WA19 Expected date:  Expected time:  Means of arrival:  Comments: EMS- seizures

## 2014-06-25 NOTE — ED Notes (Signed)
Vimpat completed 

## 2014-06-25 NOTE — ED Provider Notes (Signed)
CSN: [id number]     Arrival date & time 06/25/14  1037 History   First MD Initiated Contact with Patient 06/25/14 1042     Chief Complaint  Patient presents with  . Seizures    hx of same, witnessed by mom    (Consider location/radiation/quality/duration/timing/severity/associated sxs/prior Treatment) HPI Comments: Patient with h/o CP/MR, seizure disorder -- presents after seizure episode witnessed by mother this morning. Seizure lasted 3 minutes, full body shaking. Postictal 30 minutes afterwards. EMS called. Patient spit out his nighttime antiepileptics without mother knowing last night. He received Keppra this morning but not Vimpat. He received enema overnight and had BM. No other medical complaints. The onset of this condition was acute. The course is resolved. Aggravating factors: none. Alleviating factors: none. Level V caveat due to MR.    Patient is a 24 y.o. male presenting with seizures. The history is provided by a parent.  Seizures   Past Medical History  Diagnosis Date  . Seizures   . MR (mental retardation)   . CP (cerebral palsy)   . Hypertension   . Fracture of femur, intertrochanteric, closed   . Recurrent apnea   . 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 .   . 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 Theressa Stamps. Patient's AHI was 39.6 RDI is 43.6 positional component. Patient will need desensitization and a gentle approach to CPAP titration.    Past Surgical History  Procedure Laterality Date  . Hip surgery    . Foot surgery    . Implantation vagal nerve stimulator    . Tympanostomy tube placement  2009  . Tonsillectomy  2009  . Pediatomy  2009  . Fracture surgery     Family History  Problem Relation Age of Onset  . Cancer Paternal Grandmother     Died in her 95's  . Diabetes Paternal Grandfather     Died in  his 66's    History  Substance Use Topics  . Smoking status: Never Smoker   . Smokeless tobacco: Never Used  . Alcohol Use: No    Review of Systems  Unable to perform ROS: Patient nonverbal  Neurological: Positive for seizures.    Allergies  Depakote; Lyrica; Penicillins; and Topamax  Home Medications   Prior to Admission medications   Medication Sig Start Date End Date Taking? Authorizing Provider  albuterol (PROVENTIL) (2.5 MG/3ML) 0.083% nebulizer solution Take 3 mLs (2.5 mg total) by nebulization every 6 (six) hours as needed for wheezing or shortness of breath. 05/18/14   Preston Fleeting, MD  amLODipine (NORVASC) 5 MG tablet TAKE ONE TABLET TWICE DAILY 03/16/14   Preston Fleeting, MD  azithromycin (ZITHROMAX) 250 MG tablet Take 1 tablet (250 mg total) by mouth daily. Take 2 tablets on day 1, then 1 per day 05/18/14   Preston Fleeting, MD  cetirizine (ZYRTEC) 10 MG tablet Take 1 tablet (10 mg total) by mouth daily as needed for allergies. 12/30/13   [name], MD  DIASTAT ACUDIAL 20 MG GEL Give 15 mg rectally after 2 minutes of seizure. 04/21/14   Deetta Perla, MD  Fluocinolone Acetonide (DERMA-SMOOTHE/FS SCALP) 0.01 % OIL APPLY TWICE DAILY 04/23/14   Preston Fleeting, MD  fluticasone [facility]) 50 MCG/ACT nasal spray Place 1 spray into both nostrils 2 (two) times daily. 05/15/14   Fayrene Fearing  Eugene GarnetB Hooker, MD  hydrochlorothiazide (MICROZIDE) 12.5 MG capsule Take 12.5 mg by mouth as needed (Only gives this once or twice a month if ankles swell. Monitor BP at home). 03/20/13   Preston FleetingJames B Hooker, MD  KEPPRA 1000 MG tablet Take 1 and 1/2 tablets twice per day 04/08/14   [name], NP  midazolam (VERSED) 5 MG/ML injection Place 2 mLs (10 mg total) into the nose once. Draw up 1ml in 2 syringes. Remove blue vial access device. Attach syringe to nasal atomizer for intranasal administration. Give 1ml in each nostril for seizures lasting 2 minutes or longer. 05/03/14   [name], NP   ondansetron (ZOFRAN-ODT) 8 MG disintegrating tablet Take 1 tablet (8 mg total) by mouth every 8 (eight) hours as needed for nausea or vomiting. 01/29/14   Preston FleetingJames B Hooker, MD  OXTELLAR XR 600 MG TB24 Take 3 tablets at bedtime 01/05/14   [name], NP  polyethylene glycol (MIRALAX / GLYCOLAX) packet Take 17 g by mouth daily. Can increase to 4 capfuls a day if needed    Historical Provider, MD  VIMPAT 100 MG TABS Take 1 tab by mouth in the morning and 1 tab at 3pm 01/13/14   [name], NP  VIMPAT 200 MG TABS tablet Take 2 tabs by mouth at bedtime 01/13/14   [name], NP   BP 138/65 mmHg  Pulse 97  Temp(Src) 98.4 F (36.9 C) (Oral)  Resp 16  SpO2 99%   Physical Exam  Constitutional: He appears well-developed and well-nourished.  HENT:  Head: Normocephalic and atraumatic.  Mouth/Throat: Oropharynx is clear and moist.  Eyes: Conjunctivae are normal. Right eye exhibits no discharge. Left eye exhibits no discharge.  Neck: Normal range of motion. Neck supple.  Cardiovascular: Normal rate, regular rhythm and normal heart sounds.   No murmur heard. Pulmonary/Chest: Effort normal and breath sounds normal.  Vagal nerve stimulator upper left chest wall no complications.   Abdominal: Soft. There is no tenderness. There is no rebound and no guarding.  Neurological: He is alert.  Patient tracks movements. Baseline per mother. He does have some facial and neck twitching. Mother is concerned that he may have another seizure.   Skin: Skin is warm and dry.  Psychiatric:  Non-verbal.  Nursing note and vitals reviewed.   ED Course  Procedures (including critical care time) Labs Review Labs Reviewed  I-STAT CHEM 8, ED - Abnormal; Notable for the following:    Sodium 134 (*)    Potassium 5.6 (*)    Glucose, Bld 141 (*)    Calcium, Ion 0.96 (*)    All other components within normal limits  POTASSIUM    Imaging Review No results found.   EKG Interpretation None        11:02 AM Patient seen and examined. Work-up initiated. Medications ordered. Discussed with Dr. Effie ShyWentz. Patient seen by Dr. Effie ShyWentz.   Vital signs reviewed and are as follows: BP 138/65 mmHg  Pulse 97  Temp(Src) 98.4 F (36.9 C) (Oral)  Resp 16  SpO2 99%  3:22 PM Patient was given usual morning dose of Vimpat earlier but vomited approximately 10 minutes after taking it. I spoke with Dr. Sharene SkeansHickling. He recommends giving IV Vimpat to ensure patient gets his medication.   IV placed.   [facility] at shift change.   Plan: 150mg  IV Vimpat. Have patient eat and drink.  If he is able to eat and drink, mother is comfortable with discharge to home and  resuming usual meds. We discussed that patient may be higher risk for additional seizures.  If vomits again, admit. Call neurohospitalist to see where they want patient. Dr. Sharene Skeans states he will speak with neurohospitalist as needed. Likely hospitalist admit.   MDM   Final diagnoses:  Seizure   Plan as above. K slightly high. Suspect hemolysis.     Renne Crigler, PA-C 06/25/14 1527  Flint Melter, MD 06/25/14 [phone]

## 2014-06-25 NOTE — ED Notes (Signed)
IV access attempted x2, unsuccessful.  2nd RN to attempt.

## 2014-06-25 NOTE — ED Notes (Signed)
Pt having small twitching movements of lips/face and hands, pre-cursor to a seizure per mother, EDPA at bedside and aware.

## 2014-06-25 NOTE — ED Notes (Signed)
Initial Contact - pt resting on stretcher, awake, alert, at baseline per mother at bedside.  Mom reports pt has been coming down with a cold/nasal congestion "and not himself" x2 days, pt spit out his medications last night, which she didn't realize until later and had a witnessed approx 3-104min sz this AM, post ictal initially per EMS but mom reports pt is at baseline at this time.  Skin PWD.  MAEI.  RR even/un-lab.  NAD.

## 2014-06-25 NOTE — Discharge Instructions (Signed)
Epilepsy °Epilepsy is a disorder in which a person has repeated seizures over time. A seizure is a release of abnormal electrical activity in the brain. Seizures can cause a change in attention, behavior, or the ability to remain awake and alert (altered mental status). Seizures often involve uncontrollable shaking (convulsions).  °Most people with epilepsy lead normal lives. However, people with epilepsy are at an increased risk of falls, accidents, and injuries. Therefore, it is important to begin treatment right away. °CAUSES  °Epilepsy has many possible causes. Anything that disturbs the normal pattern of brain cell activity can lead to seizures. This may include:  °· Head injury. °· Birth trauma. °· High fever as a child. °· Stroke. °· Bleeding into or around the brain. °· Certain drugs. °· Prolonged low oxygen, such as what occurs after CPR efforts. °· Abnormal brain development. °· Certain illnesses, such as meningitis, encephalitis (brain infection), malaria, and other infections. °· An imbalance of nerve signaling chemicals (neurotransmitters).   °SIGNS AND SYMPTOMS  °The symptoms of a seizure can vary greatly from one person to another. Right before a seizure, you may have a warning (aura) that a seizure is about to occur. An aura may include the following symptoms: °· Fear or anxiety. °· Nausea. °· Feeling like the room is spinning (vertigo). °· Vision changes, such as seeing flashing lights or spots. °Common symptoms during a seizure include: °· Abnormal sensations, such as an abnormal smell or a bitter taste in the mouth.   °· Sudden, general body stiffness.   °· Convulsions that involve rhythmic jerking of the face, arm, or leg on one or both sides.   °· Sudden change in consciousness.   °· Appearing to be awake but not responding.   °· Appearing to be asleep but cannot be awakened.   °· Grimacing, chewing, lip smacking, drooling, tongue biting, or loss of bowel or bladder control. °After a seizure,  you may feel sleepy for a while.  °DIAGNOSIS  °Your health care provider will ask about your symptoms and take a medical history. Descriptions from any witnesses to your seizures will be very helpful in the diagnosis. A physical exam, including a detailed neurological exam, is necessary. Various tests may be done, such as:  °· An electroencephalogram (EEG). This is a painless test of your brain waves. In this test, a diagram is created of your brain waves. These diagrams can be interpreted by a specialist. °· An MRI of the brain.   °· A CT scan of the brain.   °· A spinal tap (lumbar puncture, LP). °· Blood tests to check for signs of infection or abnormal blood chemistry. °TREATMENT  °There is no cure for epilepsy, but it is generally treatable. Once epilepsy is diagnosed, it is important to begin treatment as soon as possible. For most people with epilepsy, seizures can be controlled with medicines. The following may also be used: °· A pacemaker for the brain (vagus nerve stimulator) can be used for people with seizures that are not well controlled by medicine. °· Surgery on the brain. °For some people, epilepsy eventually goes away. °HOME CARE INSTRUCTIONS  °· Follow your health care provider's recommendations on driving and safety in normal activities. °· Get enough rest. Lack of sleep can cause seizures. °· Only take over-the-counter or prescription medicines as directed by your health care provider. Take any prescribed medicine exactly as directed. °· Avoid any known triggers of your seizures. °· Keep a seizure diary. Record what you recall about any seizure, especially any possible trigger.   °· Make   sure the people you live and work with know that you are prone to seizures. They should receive instructions on how to help you. In general, a witness to a seizure should:   Cushion your head and body.   Turn you on your side.   Avoid unnecessarily restraining you.   Not place anything inside your  mouth.   Call for emergency medical help if there is any question about what has occurred.   Follow up with your health care provider as directed. You may need regular blood tests to monitor the levels of your medicine.  SEEK MEDICAL CARE IF:   You develop signs of infection or other illness. This might increase the risk of a seizure.   You seem to be having more frequent seizures.   Your seizure pattern is changing.  SEEK IMMEDIATE MEDICAL CARE IF:   You have a seizure that does not stop after a few moments.   You have a seizure that causes any difficulty in breathing.   You have a seizure that results in a very severe headache.   You have a seizure that leaves you with the inability to speak or use a part of your body.  Document Released: 05/21/2005 Document Revised: 03/11/2013 Document Reviewed: 12/31/2012 [facility] Patient Information 2015 [place], [place]. This information is not intended to replace advice given to you by your health care provider. Make sure you discuss any questions you have with your health care provider.  Cerebral Palsy Cerebral palsy (CP) is a broad term used to describe symptoms appearing in the first few years of life that impair (make difficult) control of movement and/or muscle tone.  The symptoms are caused by either faulty development or injury to the areas of the brain that control motor (movement) function and posture. Cerebral palsy may be passed on from parents (congenital) or acquired after birth. Common causes of cerebral palsy include:  Head Injury.  Meningitis.  Genetic Disorders.  Stroke before birth.  Lack of Oxygen to the Brain.  Prematurity. Cerebral palsy does not always cause profound handicap. Early signs of cerebral palsy usually appear before 45 years of age. Infants with cerebral palsy are frequently slow to reach developmental milestones. SYMPTOMS  Early symptoms - there are developmental delays in:  Rolling  over.  Sitting.  Crawling.  Cruising. Later symptoms:  Varied muscle tone (from too stiff to too floppy).  Exaggerated or diminished reflexes.  Lack of muscle coordination.  Difficulty with fine motor tasks (such as writing or using scissors).  Difficulty with gross motor tasks (such as balance or walking).  Involuntary movements.  Poor control of the mouth leading to drooling, chewing and swallowing problems. The symptoms differ from person to person and may change over time. Some people with cerebral palsy are also affected by other medical problems including:   Seizures (convulsions).  Mental impairment. DIAGNOSIS  Doctors diagnose cerebral palsy by:  Testing muscle tone, motor skills and reflexes.  Medical history.  Blood tests if necessary.  Imaging of the Brain and/or Spinal Cord (head ultrasound, CT, and/or MRI). Although symptoms may change over time, cerebral palsy by definition is not progressive (does not get worse). If a patient shows worsening problems, the diagnosis may be something other than cerebral palsy. CLASSIFICATION OF CEREBRAL PALSY BY LOCATION Cerebral palsy can be classified by the number of limbs involved or by the movement. There is also a combined classification that involves a mixture of different variations of CP. About one quarter of  people with CP have a mixed form of the disease.  Quadriplegia - All four of the limbs are involved.  Diplegia - The legs are involved with no arm problems.  Hemiplegia  - One side of the body is affected, usually the arm more than the leg.  Triplegia - Three limbs are involved, usually one leg and both arms.  Monoplegia - One limb is affected, usually an arm. CLASSIFICATION OF CEREBRAL PALSY BY TYPE  Spastic Cerebral Palsy: This is the most common form of CP. It affects 70 - 80 percent of sufferers. The muscles are in a constant state of spasticity. Tight and stiff muscles move in a jerky motion. Spastic  CP is usually due to damage to the cerebral cortex part of the brain.  Hypotonic Cerebral Palsy: Hypotonia means low muscle tone. These people have difficulty with motor delay and weakness. Hypotonic CP may be caused by injuries either to the brain or spinal cord.  Athetoid Cerebral Palsy: Athetosis leads to difficulty controlling and coordinating movement. Athetoid CP occurs when the muscle tone is mixed. Sometimes muscle tone is too high and sometimes it is too low. Involuntary writhing movements and constant motion are common to Athetoid CP. It is usually caused by damage to the basal ganglia in the midbrain.  Ataxic Cerebral Palsy: This is the least common form of CP. This form of CP is the result of damage to the cerebellum, the brain's major center for balance and coordination. Ataxic CP symptoms:  A disturbed sense of balance and depth perception.  Poor muscle tone.  Scanning speech (syllables are separated by pauses).  A staggering walk.  Unsteady hands.  Abnormal eye movements TREATMENT  There is no standard therapy that works for all patients. Treatment methods include:  Medications used to control seizures and muscle spasms.  Special braces to help with muscle imbalance.  Surgery - either to treat spasticity or to relax muscle tendons that are too tight.  Mechanical aids to help overcome impairments.  Counseling for emotional and psychological needs.  Physical, occupational, speech, and behavioral therapy. PROGNOSIS  At this time, cerebral palsy cannot be cured. Many patients can enjoy near-normal lives if their problems are properly managed. Document Released: 02/10/2002 Document Revised: 10/05/2013 Document Reviewed: 05/13/2008 [facility] Patient Information 2015 [place]. This information is not intended to replace advice given to you by your health care provider. Make sure you discuss any questions you have with your health care provider.

## 2014-06-25 NOTE — ED Provider Notes (Signed)
Patient signed out to me by Rhea BleacherJosh Geiple, PA-C with plan to follow-up after patient has finished IV Vimpat, and attempt by mouth challenge to see if patient is able to tolerate. Plan is to discharge home patient tolerates by mouth well. Patient continues to vomit in the ER admit patient.  Zachary [name] is a 24 year old male with past medical history of cerebral palsy and epilepsy, presenting to the ER today for a seizure. Patient's mother in the room reporting that patient missed an oral dose of his medication last night after spitting them out. Patient mother also reports patient seizure this morning occurred prior to patient receiving any of his oral Vimpat. Based on mother's description and prescription from EMS patient had a tonic-clonic seizure with clear postictal period. Oral Vimpat was attempted in the ER, however patient vomited immediately after. IV Vimpat was placed, and Dr. Sharene SkeansHickling who follows patient for cerebral palsy and epilepsy was contacted. Dr. Sharene SkeansHickling recommended giving patient IV Vimpat, and discharged home with patient is tolerating oral medications well as long as patient's mother is comfortable with continuing home care versus admission. Dr. Sharene SkeansHickling stated he'll also be reasonable to admit patient should the mother not be comfortable or should patient continue to vomit in the ER.  PE: Constitutional: well-developed, well-nourished, no apparent distress HENT: normocephalic, atraumatic Cardiovascular: normal rate and rhythm, distal pulses intact Pulmonary/Chest: effort normal; breath sounds clear and equal bilaterally; no wheezes or rales Abdominal: soft and nontender Musculoskeletal: full ROM, no edema Lymphadenopathy: no cervical adenopathy Neurological: alert , patient able to track movements. Patient nonverbal. Patient with twitching of head and neck), with purposeful movement of extremities. Patient's mother reporting this is baseline for patient Skin: warm and dry, no rash,  no diaphoresis  4:06 PM: Vimpat completed. Will have mother attempt by mouth challenge.  5:10 PM: On reassessment, patient remaining neurologically at baseline, no seizures since my contact with patient in the ER. Patient mother reporting patient has been tolerating by mouth well, without vomiting and that she is ready and comfortable to go home. Patient's mother verbalizes her backup plan and plan on going from here to follow with Dr. Sharene SkeansHickling in their primary care provider. She states she preferred to have patient at home as you can more comfortable and prefer to manage his medications there. She states she is comfortable with this and she feels this would be reasonable for the patient, and I agree that this would be reasonable for her to do based on her level of knowledge and comfort of patient's disorder and home care plan. I discussed return precautions with patient's mother, and patient's mother verbalized understanding and agreement of this plan. I encouraged him to call or return to ER should they have any worsening of symptoms or should they have any questions or concerns.  BP 134/87 mmHg  Pulse 79  Temp(Src) 98.4 F (36.9 C) (Oral)  Resp 16  Wt 164 lb (74.39 kg)  SpO2 98%  Signed,  Ladona MowJoe Abeera Flannery, PA-C 5:20 PM   Monte FantasiaJoseph W Shonta Phillis, PA-C 06/25/14 1720  Ethelda ChickMartha K Linker, MD 06/25/14 [phone]

## 2014-06-25 NOTE — ED Provider Notes (Signed)
  Face-to-face evaluation   History: Here for evaluation of seizure.  His mother feels like he spent about his usual nighttime medicines, last night.    Physical exam: Alert, calm, cooperative.  No evident focal asymmetry of strength  Medical screening examination/treatment/procedure(s) were conducted as a shared visit with non-physician practitioner(s) and myself.  I personally evaluated the patient during the encounter  Flint MelterElliott L Kiegan Macaraeg, MD 06/25/14 [phone]

## 2014-06-28 ENCOUNTER — Telehealth: Payer: Self-pay | Admitting: Pediatrics

## 2014-07-03 ENCOUNTER — Ambulatory Visit (INDEPENDENT_AMBULATORY_CARE_PROVIDER_SITE_OTHER): Payer: Medicaid Other | Admitting: Pediatrics

## 2014-07-03 VITALS — Wt 160.0 lb

## 2014-07-03 DIAGNOSIS — H66002 Acute suppurative otitis media without spontaneous rupture of ear drum, left ear: Secondary | ICD-10-CM

## 2014-07-03 MED ORDER — SULFAMETHOXAZOLE-TRIMETHOPRIM 800-160 MG PO TABS: 1.0000 | ORAL_TABLET | Freq: Two times a day (BID) | ORAL | Status: DC

## 2014-07-03 NOTE — Progress Notes (Signed)
Subjective:     Patient ID: Zachary [name], male   DOB: 30-Sep-1990, 24 y.o.   MRN: [id number]  HPI Shoulder pain, was seen by Dr. 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 Bactrim for 7 days (based on ENT recommendation to cover resistant organisms that have been cultured from Eliazar's middle ear in the past and to avoid interfering with AED metabolism and thus seizure threshold. 2. Supportive care discussed 3. Follow-up as needed

## 2014-07-09 ENCOUNTER — Other Ambulatory Visit: Payer: Self-pay

## 2014-07-09 DIAGNOSIS — G40319 Generalized idiopathic epilepsy and epileptic syndromes, intractable, without status epilepticus: Secondary | ICD-10-CM

## 2014-07-09 DIAGNOSIS — G40309 Generalized idiopathic epilepsy and epileptic syndromes, not intractable, without status epilepticus: Secondary | ICD-10-CM

## 2014-07-09 MED ORDER — VIMPAT 200 MG PO TABS: ORAL_TABLET | ORAL | Status: DC

## 2014-07-09 MED ORDER — VIMPAT 100 MG PO TABS: ORAL_TABLET | ORAL | Status: DC

## 2014-07-09 MED ORDER — OXTELLAR XR 600 MG PO TB24: ORAL_TABLET | ORAL | Status: DC

## 2014-07-17 ENCOUNTER — Ambulatory Visit (INDEPENDENT_AMBULATORY_CARE_PROVIDER_SITE_OTHER): Payer: Medicaid Other | Admitting: Pediatrics

## 2014-07-17 DIAGNOSIS — F840 Autistic disorder: Secondary | ICD-10-CM

## 2014-07-17 DIAGNOSIS — H66006 Acute suppurative otitis media without spontaneous rupture of ear drum, recurrent, bilateral: Secondary | ICD-10-CM

## 2014-07-17 MED ORDER — SULFAMETHOXAZOLE-TRIMETHOPRIM 800-160 MG PO TABS: 1.0000 | ORAL_TABLET | Freq: Two times a day (BID) | ORAL | Status: AC

## 2014-07-17 NOTE — Progress Notes (Signed)
Subjective:     Patient ID: Zachary [name], male   DOB: Aug 03, 1990, 24 y.o.   MRN: [id number]  HPI Confirmed MRSA Had tubes but have been removed Typically continues Bactrim for 14 days, only did 7 days last time Has been indicating ear pain recently, then seizure on Thursday night Mother treating with drops  Review of Systems  Constitutional: Positive for activity change. Negative for fever.  HENT: Positive for ear pain. Negative for ear discharge.   Eyes: Negative.   Respiratory: Negative.    Objective:   Physical Exam  Constitutional: He appears well-developed and well-nourished. No distress.  HENT:  Right Ear: There is tenderness. No drainage. Tympanic membrane is erythematous and bulging. A middle ear effusion is present.  Left Ear: There is tenderness. No drainage. Tympanic membrane is erythematous and bulging. A middle ear effusion is present.   Both TM inflamed, erythematous, pus behind    Assessment:     [name] with history significant for Autism Spectrum disorder, also recurrent middle ear infections and recently had T-tubes removed (culture from ear drainage grew MRSA), now with yet another bilateral suppurative otitis media just 2 weeks after treatment for the last bilateral infection.    Plan:     Bactrim DS 1 tablet PO twice daily for 14 days Mother will contact ENT regarding history of infections and need to reconsider tubes Supportive care discussed in detail Follow-up as needed

## 2014-07-22 ENCOUNTER — Other Ambulatory Visit: Payer: Self-pay | Admitting: Pediatrics

## 2014-07-29 ENCOUNTER — Telehealth: Payer: Self-pay | Admitting: Pediatrics

## 2014-07-29 NOTE — Telephone Encounter (Signed)
Mother called to ask you to [facility] for a PT assessment for home modification.If you have any questions please call mother. [facility] # [phone]

## 2014-07-31 ENCOUNTER — Encounter: Payer: Self-pay | Admitting: Pediatrics

## 2014-08-17 ENCOUNTER — Ambulatory Visit (INDEPENDENT_AMBULATORY_CARE_PROVIDER_SITE_OTHER): Payer: Medicaid Other | Admitting: Pediatrics

## 2014-08-17 DIAGNOSIS — K5900 Constipation, unspecified: Secondary | ICD-10-CM

## 2014-08-17 DIAGNOSIS — S0093XA Contusion of unspecified part of head, initial encounter: Secondary | ICD-10-CM | POA: Diagnosis not present

## 2014-08-17 NOTE — Progress Notes (Signed)
Subjective:     Patient ID: Zachary [name], male   DOB: 1991-04-01, 24 y.o.   MRN: [id number]  HPI Mother noted that Zachary [name] had a swelling on the L side of the top of his head near the back this morning Seems tender when se touches the area,  Coincides with the area that he will rest on a hard wooden head board when asleep Denies any significant fall, either witnessed or not No fever, no bruising, no bleeding  Does appear that he has become more constipated, no poop in 2+ days Seems to hold stool, has behaviors such as laying on stomach Gives him Miralax, sometimes seems to work other times just flows around something Has given molasses enemas in the past (effective, though very uncomfortable)  Review of Systems  Constitutional: Negative.   Respiratory: Negative.   Gastrointestinal: Positive for abdominal pain, constipation and abdominal distention.   Objective:   Physical Exam  Constitutional: He appears well-developed. No distress.  HENT:  Right Ear: External ear normal.  Left Ear: External ear normal.  Has area of soft tissue swelling on Right side, near back of head, seems tender to palpation but is not dicolored  Cardiovascular: Normal rate and regular rhythm.   No murmur heard. Pulmonary/Chest: Effort normal and breath sounds normal. He has no wheezes.  Abdominal: Soft. He exhibits distension.   Assessment:     [name] with sever autism, now with what appears to be a mild contusion on head and constipation    Plan:     Monitor contusion, seems to be getting better fairly quickly Reassured mother that swelling on head is not infection Getting T-tubes re-placed in ears tomorrow Continue with Miralax, mother will try molasses enema If can get the bulk of hard stool out, then will need to try and reconfigure bowel regimen to keep him from getting constipated again (seems to try and avoid Miralax) If mother unsuccessful, probably need to send him to GI

## 2014-08-23 ENCOUNTER — Ambulatory Visit: Payer: Medicaid Other | Attending: Pediatrics | Admitting: Physical Therapy

## 2014-08-23 DIAGNOSIS — M6289 Other specified disorders of muscle: Secondary | ICD-10-CM | POA: Diagnosis not present

## 2014-08-23 DIAGNOSIS — R293 Abnormal posture: Secondary | ICD-10-CM | POA: Insufficient documentation

## 2014-08-23 DIAGNOSIS — R269 Unspecified abnormalities of gait and mobility: Secondary | ICD-10-CM | POA: Diagnosis not present

## 2014-08-25 NOTE — Therapy (Signed)
[facility] [address] [place], [place], [address] Phone: [phone]   Fax:  [phone]  Physical Therapy Evaluation  Patient Details  Name: Zachary [name] MRN: [id number] Date of Birth: Jun 08, 1990 Referring Provider:  Preston Fleeting, MD  Encounter Date: 08/23/2014      PT End of Session - 08/25/14 0805    Visit Number 1   Number of Visits 1   Authorization Type Medicaid   PT Start Time 1319   PT Stop Time 1406   PT Time Calculation (min) 47 min   Equipment Utilized During Treatment Gait belt   Activity Tolerance Patient tolerated treatment well   Behavior During Therapy Flat affect  appears fatigued, multiple yawns during session; mom reports it's naptime      Past Medical History  Diagnosis Date  . Seizures   . MR (mental retardation)   . CP (cerebral palsy)   . Hypertension   . Fracture of femur, intertrochanteric, closed   . Recurrent apnea   . 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 .   . 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 Theressa Stamps. Patient's AHI was 39.6 RDI is 43.6 positional component. Patient will need desensitization and a gentle approach to CPAP titration.     Past Surgical History  Procedure Laterality Date  . Hip surgery    . Foot surgery    . Implantation vagal nerve stimulator    . Tympanostomy tube placement  2009  . Tonsillectomy  2009  . Pediatomy  2009  . Fracture surgery      There were no vitals filed for this visit.  Visit Diagnosis:  Abnormality of gait  Abnormal posture  Abnormal increased muscle tone      Subjective Assessment - 08/25/14 0821    Symptoms Pt is a 24 year old male who presents to OP PT for home modification evaluation.  Pt and mom present to OP PT with community guide advocate, [name].   Pt is total/dependent for care due to autism, quadripleigia, seizures.  Pt is ambulatory with assistance; however, when pt has had seizures, mom sometimes has to provide total lift.  Mom has shoulder pain and is concerned about her safety and safety of son during bathing and toileting in current bathroom situation.   Patient Stated Goals Improved ease of transfers for showering and toileting; increased safety and decreased caregiver burden for shower and toilet   Currently in Pain? No/denies            [facility] PT Assessment - 08/25/14 0757    Assessment   Medical Diagnosis cerebral palsy  L sided weakness, L visual field deficit   Precautions   Precautions Fall  seizure   Balance Screen   Has the patient fallen in the past 6 months Yes   How many times? 2  at least 2-3/wk, mom catches pt before fall, due to seizures   Has the patient had a decrease in activity level because of a fear of falling?  No   Is the patient reluctant to leave their home because of a fear of falling?  No   Home Environment   Living Enviornment Private residence   Living Arrangements Parent   Available Help at Discharge Family  mom and grandmother   Type of Home House   Home  Access Level entry  front; back deck has 1-3 steps no rails   Home Layout One level;Full bath on main level;Able to live on main level with bedroom/bathroom   Home Equipment Hand held shower head;Wheelchair - manual;Hospital bed   Prior Function   Level of Independence Needs assistance with transfers;Needs assistance with ADLs;Needs assistance with gait   Leisure Goes to [employer]  day program   ROM / Strength   AROM / PROM / Strength PROM   PROM   Overall PROM Comments R ankle dorsiflexion 5 degrees   PROM Assessment Site Ankle   Left Ankle Dorsiflexion 3  from neutral   RLE Tone   RLE Tone Moderate   LLE Tone   LLE Tone Moderate     Posture:  Sitting posture in his manual wheelchair:  Patient sits with slumped posture,  posterior pelvic tilt with rounded shoulders.  He sits with LLE crossed onto RLE, but is able to briefly reposition.  His feet and lower legs are in increased internal rotation, plantarflexed position, but with passive range of motion, pt is able to achieve near-neutral ankle dorsiflexion (see above measurement).                                  Plan - 08/25/14 0808    Clinical Impression Statement Pt is a 24 year old male who presents to OP PT with mother and community guide (advocate) with history of seizures, developmental delay, autism, MR, CP intertrochanteric fracture on LLE in the past.  Mother reports that she provides total care assistance with ADLs, showering, dressing for patient on a daily basis.  The bathroom in their current home is not wheelchair accessible and is posing safety hazards for patient and mother due to pt's seizures, causing significant fluctuations in mobility.  Pt presents in manual wheelchair.  While pt is ambulatory with HHA at times, seizures make mobility very unpredictable, with mother having do perform total lift of patient at times.  Mother desires the following modifications for their bathroom:   raised toilet seat with handles/rails, widened doorway for wheelchair accessibility, roll-in shower, rolling shower chair/wall mount shower chair, single sink and cabinet area for increased space for wheelchair/shower chair; new flooring for improved texture to prevent fall hazard, grab bars in shower/toilet area.  A full letter of medical necessity will be written to follow with justification of itemts requested.   Pt will benefit from skilled therapeutic intervention in order to improve on the following deficits Abnormal gait;Decreased balance;Decreased cognition;Decreased mobility;Decreased range of motion;Decreased safety awareness;Difficulty walking;Impaired flexibility;Impaired tone   Rehab Potential --  NA as this is eval only   PT Frequency  --  Eval only for home modification   PT Treatment/Interventions ADLs/Self Care Home Management;Patient/family education  letter of medical necessity for home modification requests   Consulted and Agree with Plan of Care Patient;Family member/caregiver;Other (Comment)  Neila Gear, community guide/advocate         Problem List Patient Active Problem List   Diagnosis Date Noted  . 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 Status post placement of VNS (vagus nerve stimulation) device 02/11/2014  . Medication monitoring encounter 02/11/2014  . 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 Rhinitis, allergic 12/30/2013  . 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 Congenital quadriplegia 12/30/2013  . Status post VNS (vagus nerve  stimulator) placement 11/09/2013  . Unspecified sleep apnea 11/09/2013  . Well adult health check 04/28/2013  . Fluid retention in legs 01/10/2013  . 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 Abnormality of gait 10/23/2012  . Congenital diplegia 10/23/2012  . Generalized hyperhidrosis 10/23/2012  . Other sleep disturbances 10/23/2012  . 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 Closed fracture of intertrochanteric section of femur 10/23/2012  . 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 Hypertension 06/25/2011  . Constipation, chronic 12/27/2010  . Tonic-clonic seizures, intractable 11/05/2010  . Development delay 11/05/2010  . MRSA (methicillin resistant Staphylococcus aureus) carrier 11/05/2010    Leasa Kincannon W. 08/25/2014, 8:23 AMfor evaluation performed on 08/23/14 Lonia Bloodmy Saliah Crisp, PT 08/25/2014 8:27 AM Phone: [phone] Fax: [phone]   [facility] [address] [place], [place], [address] Phone: [phone]   Fax:   [phone]

## 2014-08-31 ENCOUNTER — Ambulatory Visit (INDEPENDENT_AMBULATORY_CARE_PROVIDER_SITE_OTHER): Payer: Medicaid Other | Admitting: Pediatrics

## 2014-08-31 VITALS — Wt 168.0 lb

## 2014-08-31 DIAGNOSIS — J301 Allergic rhinitis due to pollen: Secondary | ICD-10-CM | POA: Diagnosis not present

## 2014-08-31 DIAGNOSIS — F840 Autistic disorder: Secondary | ICD-10-CM

## 2014-08-31 DIAGNOSIS — G40209 Localization-related (focal) (partial) symptomatic epilepsy and epileptic syndromes with complex partial seizures, not intractable, without status epilepticus: Secondary | ICD-10-CM

## 2014-08-31 MED ORDER — FLUTICASONE PROPIONATE 50 MCG/ACT NA SUSP: 1.0000 | Freq: Two times a day (BID) | NASAL | Status: DC

## 2014-08-31 MED ORDER — CETIRIZINE HCL 10 MG PO TABS: 10.0000 mg | ORAL_TABLET | Freq: Every day | ORAL | Status: DC | PRN

## 2014-08-31 NOTE — Progress Notes (Signed)
Subjective:     Patient ID: Zachary [name], male   DOB: July 14, 1990, 24 y.o.   MRN: [id number]  HPI Pulling at ears today 3 seizures at school yesterday Had T tubes placed in ears about 1.5 weeks ago (March 21st) No drainage, still putting drops in (about 1.5 more weeks)  Has been coughing, seems to have difficulty breathing through nose at night Not sleeping well, maybe contributing to more seizures  Review of Systems  Constitutional: Negative for fever and appetite change.  HENT: Positive for congestion, ear pain and rhinorrhea. Negative for ear discharge.   Respiratory: Negative.   Cardiovascular: Negative.   Gastrointestinal: Negative.    Objective:   Physical Exam T-tubes in place Ear drum appropriately irritated given surgery 1 week ago No drainage in external canal [Remainder of exam at baseline for this patient]    Assessment:     [name] with PMH of severe ASD,(non-verbal) and seizure disorder, also with chronic ear infections and history of MRSA in ears, now with what appears to be allergic rhinitis symptoms    Plan:     Discussed exam findings with mother Recommended starting Cetirizine and Flonase Also, discussed nasal saline irrigation Will follow up as needed

## 2014-09-02 ENCOUNTER — Encounter: Payer: Self-pay | Admitting: Pediatrics

## 2014-09-06 ENCOUNTER — Other Ambulatory Visit: Payer: Self-pay | Admitting: *Deleted

## 2014-09-06 DIAGNOSIS — G40319 Generalized idiopathic epilepsy and epileptic syndromes, intractable, without status epilepticus: Secondary | ICD-10-CM

## 2014-09-06 MED ORDER — OXTELLAR XR 600 MG PO TB24: ORAL_TABLET | ORAL | Status: DC

## 2014-09-09 ENCOUNTER — Telehealth: Payer: Self-pay | Admitting: Pediatrics

## 2014-09-09 NOTE — Telephone Encounter (Signed)
School states Zachary RuizJohn has gained 10# in the last 6 months and his blood pressure has been elevated . Mother would like to talk to you about these issues.

## 2014-09-13 ENCOUNTER — Other Ambulatory Visit: Payer: Self-pay | Admitting: Family

## 2014-09-13 DIAGNOSIS — G40309 Generalized idiopathic epilepsy and epileptic syndromes, not intractable, without status epilepticus: Secondary | ICD-10-CM

## 2014-09-13 MED ORDER — VIMPAT 100 MG PO TABS: ORAL_TABLET | ORAL | Status: DC

## 2014-09-13 MED ORDER — VIMPAT 200 MG PO TABS: ORAL_TABLET | ORAL | Status: DC

## 2014-09-24 ENCOUNTER — Encounter: Payer: Self-pay | Admitting: Neurology

## 2014-10-07 ENCOUNTER — Other Ambulatory Visit: Payer: Self-pay | Admitting: Family

## 2014-10-07 DIAGNOSIS — G40319 Generalized idiopathic epilepsy and epileptic syndromes, intractable, without status epilepticus: Secondary | ICD-10-CM

## 2014-10-07 MED ORDER — KEPPRA 1000 MG PO TABS: ORAL_TABLET | ORAL | Status: DC

## 2014-10-12 ENCOUNTER — Encounter: Payer: Self-pay | Admitting: Pediatrics

## 2014-10-12 ENCOUNTER — Ambulatory Visit (INDEPENDENT_AMBULATORY_CARE_PROVIDER_SITE_OTHER): Payer: Medicaid Other | Admitting: Pediatrics

## 2014-10-12 VITALS — BP 122/74 | HR 84 | Ht 61.0 in | Wt 170.2 lb

## 2014-10-12 DIAGNOSIS — G4737 Central sleep apnea in conditions classified elsewhere: Secondary | ICD-10-CM | POA: Diagnosis not present

## 2014-10-12 DIAGNOSIS — G4731 Primary central sleep apnea: Secondary | ICD-10-CM

## 2014-10-12 DIAGNOSIS — G40319 Generalized idiopathic epilepsy and epileptic syndromes, intractable, without status epilepticus: Secondary | ICD-10-CM

## 2014-10-12 DIAGNOSIS — G4733 Obstructive sleep apnea (adult) (pediatric): Secondary | ICD-10-CM

## 2014-10-12 DIAGNOSIS — G40309 Generalized idiopathic epilepsy and epileptic syndromes, not intractable, without status epilepticus: Secondary | ICD-10-CM

## 2014-10-12 DIAGNOSIS — G40209 Localization-related (focal) (partial) symptomatic epilepsy and epileptic syndromes with complex partial seizures, not intractable, without status epilepticus: Secondary | ICD-10-CM

## 2014-10-12 DIAGNOSIS — G808 Other cerebral palsy: Secondary | ICD-10-CM

## 2014-10-12 DIAGNOSIS — G40311 Generalized idiopathic epilepsy and epileptic syndromes, intractable, with status epilepticus: Secondary | ICD-10-CM

## 2014-10-12 DIAGNOSIS — F840 Autistic disorder: Secondary | ICD-10-CM | POA: Diagnosis not present

## 2014-10-12 MED ORDER — KEPPRA 1000 MG PO TABS: ORAL_TABLET | ORAL | Status: DC

## 2014-10-12 MED ORDER — VIMPAT 100 MG PO TABS: ORAL_TABLET | ORAL | Status: DC

## 2014-10-12 MED ORDER — VIMPAT 200 MG PO TABS: ORAL_TABLET | ORAL | Status: DC

## 2014-10-12 MED ORDER — OXTELLAR XR 600 MG PO TB24: ORAL_TABLET | ORAL | Status: DC

## 2014-10-12 NOTE — Progress Notes (Signed)
Patient: Zachary AlbertsJohn T [name] MRN: [id number] Sex: male DOB: Feb 08, 1991  Provider: Deetta PerlaHICKLING,Tris Howell H, MD [facility]  Note type: Routine return visit  History of Present Illness: Referral Source: Dr. Ferman HammingJames Hooker History [facility]: [facility] Chief Complaint: seizures  Zachary [name] is a 24 y.o. male who returns Oct 12, 2014, for the first time since April 14, 2014.  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  He takes four antiepileptic medications and also has a vagal nerve stimulator.  He has an episodes of sleep apnea caused by the VNS, not prolonged enough to cause desaturation.  He now has a CPAP device that he wears much of the time and when he does his sleep is better.  He takes and tolerates his antiepileptic medicines.  His mother tells me that there have been 12 seizures since January 2016.  These are tonic seizures that last for minutes at a time.  She feels that the frequency and severity of seizures has markedly diminished since the vagal nerve stimulator was implanted.  She feels that in general her friend is more alert.  I interrogated and reprogrammed his vagal nerve stimulator and that is noted below.  He gained about 10 pounds because he was eating too much and was not exercising.  His mother is aware of that and has begun to cut back.  His general health has been good.  Procedure: interrogation of vagal nerve stimulator  The implanted device is series 102, serial [id number], implanted May 23, 2011.  On interrogation: amplitude 2.50 mA, frequency 20 Hz, pulse width 250 s, stimulation duration 7 seconds, stimulation interval 0.5 minutes, magnet amplitude 2.75 mA, magnet stimulation duration 60 seconds, magnet pulse width 250 s.  Number of stimuli since last visit is 33; total  number since implantation: 565.  Normal diagnostics with an amplitude of 2.50 milliamps again showed intact communication, output, and impedance. DC/ DC conversion code was 3. There is no indication to change the battery.  I reprogrammed the device to stimulate every 0.8 minutes. I decreased the stimulus to 7 seconds in duration.  Review of Systems: 12 system review was unremarkable except as noted above  Past Medical History Diagnosis Date  . Seizures   . MR (mental retardation)   . CP (cerebral palsy)   . Hypertension   . Fracture of femur, intertrochanteric, closed   . Recurrent apnea   . Status post VNS (vagus nerve stimulator) placement 11/09/2013    Placed in 05/23/11, Dr Sharene SkeansHickling follows the settings and his seizure disorder. .   . 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 Theressa StampsJames Crowe. Patient's AHI was 39.6 RDI is 43.6 positional component. Patient will need desensitization and a gentle approach to CPAP titration.    Hospitalizations: No., Head Injury: No., Nervous System Infections: No., Immunizations up to date: Yes.    I began to see him on July 10, 2004. He apparently was seen by me when he was eight months of age with developmental delay. Seizure activity; however, did not began until he is 14, two days after starting amitriptyline. 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 His most recent CT scan of the brain was on  September 29, 2009, and showed no significant changes.  EEG showed diffuse background slowing.   He was treated with Depakote, Topamax, and Lyrica, but suffered side effects and agitation from those medications. He has taken Trileptal, Keppra, and Vimpat for quite some time. He [facility] on Oct 27, 2010 for 6  days.for a phase 1 evaluation. MRI of the brain showed ex-vacuo dilatation of his ventricles and cortical atrophy. Volume loss most pronounced in the parietal lobes bilaterally, temporal lobes with widening of the sylvian fissures and a paucity of white matter diffusely most pronounced again in the parietal lobes with patchy T2 hyperintensity. 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 There was no evidence of mesial temporal sclerosis.  Magnetoencephalogram showed epileptiform activity arising from the left posterior temple region with a field extending to the occipital lobe. This was not seen with concurrent routine EEG.  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 This was somewhat worse in the right than the left bilateral temporal cortex and temporal pole suggesting a longstanding insult.  Prolonged video EEG showed onset of bifrontal slowing that evolved into rhythmic activity with spikes of greater amplitude over the right central region with secondary generalization. Clinically, the patient has onset of a myoclonic jerk with his head turned to the left, movement of the left arm and leg, then tonic posturing followed by tonic-clonic generalized movements.  One day later the patient had bifrontal rhythmic beta range activity followed by decrement in voltage with fast beta range activity for about 30 seconds and then generalized rhythmic beta range activity. The patient had a slight head bob slowly lowered to the bed with stiffening of both upper extremities and tonic-clonic movement of both upper extremities followed by secondary generalization in his legs. A total of seven seizures were monitored. The last three were obscured by electrode artifact. Seizure onset appeared to occur from both frontal regions. This led to the recommendations to place the vagal nerve  stimulator.  .  He has frequent ear infections, urinary tract infections, constipation and a hip fracture. He has had problems with neutropenia and thrombocytopenia. He had some vomiting in June, 2010 and developed aspiration pneumonia. He tends to have a temperature of 99 in the late evening. He has had several episodes in which his left knee has been dislocated. This happened as recently as August 28, 2009. He sat down in a chair and his knee went out of place. EMS was called and he was taken to the ER. While waiting for the ER physician, Shahrukh stretched and his knee went back in to place. His mother said that he was subsequently seen by his orthopedist, who thinks that he may eventually need surgery on the knee.   Behavior History none  Surgical History Procedure Laterality Date  . Hip surgery    . Foot surgery    . Implantation vagal nerve stimulator    . Tympanostomy tube placement  2009  . Tonsillectomy  2009  . Pediatomy  2009  . Fracture surgery     Family History family history includes Cancer in his paternal grandmother; Diabetes in his paternal grandfather. Family history is negative for migraines, seizures, intellectual disabilities, blindness, deafness, birth defects, chromosomal disorder, or autism.  Social History . Marital Status: Single    Spouse Name: N/A  . Number of Children: N/A  .  Years of Education: N/A   Social History Main Topics  . Smoking status: Never Smoker   . Smokeless tobacco: Never Used  . Alcohol Use: No  . Drug Use: No  . Sexual Activity: No   Social History Narrative   Educational level Day program School Attending: [employer]  Living with mother and sibling   Hobbies/Interest: [place] likes to swim and having water therapy.  School comments Zachary RuizJohn is in a day program.  Allergies Allergen Reactions  . Depakote [Divalproex Sodium] Other (See Comments)    Low Platelets  . Lyrica [Pregabalin] Other (See Comments)     Sleepiness  . Penicillins Hives and Rash  . Topamax [Topiramate] Other (See Comments)    Weight Loss   Physical Exam BP 122/74 mmHg  Pulse 84  Ht 5\' 1"  (1.549 m)  Wt 170 lb 3.2 oz (77.202 kg)  BMI 32.18 kg/m2  General: well developed, obese young man, seated in wheelchair in exam room.  Head: head normocephalic and atraumatic.  Ears, Nose and Throat: oropharynx benign. 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  Neck: supple with no carotid or supraclavicular bruits.  Respiratory: lung clear to auscultation.  Cardiovascular: regular rate and rhythm, no murmurs; he has edema in both feet  Skin: facial acne. He has well healed surgical scars on his left neck and his left anterior chest from the VNS  Trunk: truncal obesity  Neurologic Exam  Mental Status: Awake and alert. He does not make eye contact. He does not speak. He resists some efforts at examination. He is unable to follow commands. He has very limited language.  Cranial Nerves: Fundoscopic exam reveals poorly visualized disc margins. Pupils equal, briskly reactive to light. Extraocular movements bilateral exotropia without nystagmus; disconjugate gaze with amblyopia on the right. He does not regard objects in the left visual field as well as the right, he has difficulties judging distance when coordinating movements. Hearing appears intact as he turns to localize sounds. Neck flexion and extension normal after VNS was implanted .  Motor: Normal bulk with increased tone in all extremities. decreased strength with formal strength testing in all extremities. wheelchair for transport . Clumsy fine motor movements. 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  Sensory: Intact to withdrawal x4.  Coordination: Poor quality fine motor movements.  Gait and Station: Arises from wheelchair with mild difficulty. Stance is narrow based. Gait demonstrates diplegic  gait that is slow with shortened stride length. Varus position of his feet . He walked fairly well today without any assistance.  Reflexes: Diminished and symmetric. Toes downgoing. He had no clonus today.  Assessment 1. Complex partial seizures involving generalized tonic-clonic seizures, G40.209. 2. General convulsive epilepsy with intractable epilepsy, G40.311. 3. 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 4. 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 5. Complex sleep apnea syndrome, G47.33, G47.37.  Discussion The patient's seizures are stable.  I do not think that we can improve much upon his current frequency.  I did not recommended increasing doses of his antiepileptic drugs.    Plan He will continue on four antiepileptic drugs.  I refilled them.  He will return to see me in six months' time.  I spent 30 minutes of face-to-face time with Zachary RuizJohn and his mother and interrogated and reprogrammed his vagal nerve stimulator.   Medication List   This list is accurate as of: 10/12/14 11:59 PM.  albuterol (2.5 MG/3ML) 0.083% nebulizer solution  Commonly known as:  PROVENTIL  Take 3 mLs (2.5 mg total) by nebulization every 6 (six) hours as needed for wheezing or shortness of breath.     cetirizine 10 MG tablet  Commonly known as:  ZYRTEC  Take 1 tablet (10 mg total) by mouth daily as needed for allergies.     DERMA-SMOOTHE/FS BODY 0.01 % Oil  Apply 1 application topically daily as needed (for dry skin).     DERMA-SMOOTHE/FS SCALP 0.01 % Oil  APPLY TWICE DAILY     DIASTAT ACUDIAL 20 MG Gel  Generic drug:  diazepam  Give 15 mg rectally after 2 minutes of seizure.     docusate sodium 100 MG capsule  Commonly known as:  COLACE  Take 200 mg by mouth daily.     fluticasone 50 MCG/ACT nasal spray  Commonly known as:  FLONASE  Place 1 spray into both nostrils 2 (two) times daily.     hydrochlorothiazide 12.5 MG  capsule  Commonly known as:  MICROZIDE  Take 12.5 mg by mouth 2 (two) times daily.     KEPPRA 1000 MG tablet  Generic drug:  levETIRAcetam  Take 1 and 1/2 tablets twice per day     midazolam 5 MG/ML injection  Commonly known as:  VERSED  Place 2 mLs (10 mg total) into the nose once. Draw up 1ml in 2 syringes. Remove blue vial access device. Attach syringe to nasal atomizer for intranasal administration. Give 1ml in each nostril for seizures lasting 2 minutes or longer.     ondansetron 8 MG disintegrating tablet  Commonly known as:  ZOFRAN-ODT  Take 1 tablet (8 mg total) by mouth every 8 (eight) hours as needed for nausea or vomiting.     OXTELLAR XR 600 MG Tb24  Generic drug:  OXcarbazepine ER  Take 3 tablets at bedtime     polyethylene glycol packet  Commonly known as:  MIRALAX / GLYCOLAX  Take 17 g by mouth daily as needed for mild constipation. Can increase to 4 capfuls a day if needed     VIMPAT 200 MG Tabs tablet  Generic drug:  lacosamide  Take 2 tabs by mouth at bedtime     VIMPAT 100 MG Tabs  Generic drug:  Lacosamide  Take 1 tab by mouth in the morning and 1 tab at 3pm      The medication list was reviewed and reconciled. All changes or newly prescribed medications were explained.  A complete medication list was provided to the patient/caregiver.  Deetta Perla MD

## 2014-10-18 ENCOUNTER — Other Ambulatory Visit: Payer: Self-pay | Admitting: Pediatrics

## 2014-10-18 ENCOUNTER — Telehealth: Payer: Self-pay

## 2014-10-18 ENCOUNTER — Telehealth: Payer: Self-pay | Admitting: Family

## 2014-10-18 DIAGNOSIS — R451 Restlessness and agitation: Secondary | ICD-10-CM

## 2014-10-18 MED ORDER — SULFAMETHOXAZOLE-TRIMETHOPRIM 800-160 MG PO TABS: 1.0000 | ORAL_TABLET | Freq: Two times a day (BID) | ORAL | Status: AC

## 2014-10-18 NOTE — Telephone Encounter (Signed)
"  Boil" in pubic hair distribution, does not seem at base of hair follicle Hard to touch, about "quarter-sized," tender to touch With past infection history is at much increased risk of MRSA Will call in Bactrim to start ASAP Come in to be seen if anything changes

## 2014-10-18 NOTE — Telephone Encounter (Signed)
I can't find them in the last time once we prescribed this medicine.  I'm little anxious about prescribing it.  I was unable to reach mother were left a message for her to call on her home phone number.  I don't know the dose that was given to him previously.

## 2014-10-18 NOTE — Telephone Encounter (Signed)
Zachary [name]'s mother called and stated that Zachary RuizJohn has a boil on his bottom. She would like you to call her please.

## 2014-10-18 NOTE — Telephone Encounter (Signed)
Mom Zachary [name] left message about Zachary [name]. Mom said that at his appointment last week, she forgot to ask for Rx for Lorazepam. Mom said that he gets very anxious and restless, especially in the afternoon. He used to have Rx for low dose that Mom used sparingly, but then he didn't need it for awhile, and Rx expired. Mom feels that he feels that he needs it again. Mom asks for [facility] to [facility]. Mom can be reached at [phone]. TG

## 2014-10-19 MED ORDER — LORAZEPAM 1 MG PO TABS: ORAL_TABLET | ORAL | Status: DC

## 2014-10-19 NOTE — Telephone Encounter (Signed)
This was prescribed in 2011 mother had a 30 tablet bubble pack of which she had used only 19 over a period of 5 years.Prescription was issued.

## 2014-11-19 ENCOUNTER — Ambulatory Visit (INDEPENDENT_AMBULATORY_CARE_PROVIDER_SITE_OTHER): Payer: Medicaid Other | Admitting: Pediatrics

## 2014-11-19 ENCOUNTER — Other Ambulatory Visit: Payer: Self-pay | Admitting: Pediatrics

## 2014-11-19 DIAGNOSIS — R600 Localized edema: Secondary | ICD-10-CM

## 2014-11-19 DIAGNOSIS — F840 Autistic disorder: Secondary | ICD-10-CM | POA: Diagnosis not present

## 2014-11-19 DIAGNOSIS — H6092 Unspecified otitis externa, left ear: Secondary | ICD-10-CM

## 2014-11-19 DIAGNOSIS — G808 Other cerebral palsy: Secondary | ICD-10-CM

## 2014-11-19 DIAGNOSIS — Z22322 Carrier or suspected carrier of Methicillin resistant Staphylococcus aureus: Secondary | ICD-10-CM

## 2014-11-19 MED ORDER — HYDROCORTISONE-ACETIC ACID 1-2 % OT SOLN: 4.0000 [drp] | Freq: Three times a day (TID) | OTIC | Status: AC

## 2014-11-19 MED ORDER — MUPIROCIN 2 % EX OINT: 1.0000 "application " | TOPICAL_OINTMENT | Freq: Two times a day (BID) | CUTANEOUS | Status: DC

## 2014-11-19 NOTE — Progress Notes (Signed)
Subjective:     Patient ID: Zachary [name], male   DOB: [birth year], 24 y.o.   MRN: [id number]  HPI Past 3-4 days has been agitated Had not had seizure in over 1.5 months til had one last week Last week started smacking at head, more agitated Another seizure this morning Seems to have "stridor" when waking up in the morning Feet have also been looking more swollen Has been having small pustules, had a larger one under his arm  Weight gain? About 11 pounds in past 5-6 months Has not noted any drainage from ears (consistently hitting at the R ear) On HCTZ 12.5 mg BID Amlodipine only as needed DBP has been running in the low 90's  Review of Systems See HPI    Objective:   Physical Exam R external auditory canal is irritated, tube in place without drainage, exudate from external canal walls Tubes are in place bilaterally, no drainage from either 1+ pitting edema in both lower extremities, feet visibly swollen    Assessment:     Severely autistic male in mid-20's with bilateral lower extremity fluid retention, now with left-sided acute otitis externa and concern for folliculitis (MRSA carrier)    Plan:     Mupirocin ointment, apply as directed to affected skin Ear drops, HC Otic as directed for L-sided otitis externa Blood pressure actually appears to be under good control on current medications, lower extremity edema may be as much of a function of his general lack of exercise, that he sits in a wheelchair, rather than sequela of hypertension. Ordered compression stockings to be measured and fit for lower extremities Follow-up as needed Compression stockings

## 2014-11-22 ENCOUNTER — Other Ambulatory Visit: Payer: Self-pay | Admitting: Pediatrics

## 2014-11-22 MED ORDER — ACETIC ACID 2 % OT SOLN: 5.0000 [drp] | Freq: Three times a day (TID) | OTIC | Status: AC

## 2014-11-30 ENCOUNTER — Telehealth: Payer: Self-pay

## 2014-11-30 DIAGNOSIS — G40319 Generalized idiopathic epilepsy and epileptic syndromes, intractable, without status epilepticus: Secondary | ICD-10-CM

## 2014-11-30 NOTE — Telephone Encounter (Signed)
We'll obtain [facility]ep[facility] from [facility]  I told mother about the new lab location.If his seizures continue at the same frequency of I want to see him sooner than 6 months which would be November, 2016.

## 2014-11-30 NOTE — Telephone Encounter (Signed)
Zachary [name], mom, lvm stating that patient had 6 szs this month, latest sz occurred yesterday. The szs are lasting approximately 1.5 mins in length. Zachary [name] would like to speak to a provider: [phone].

## 2015-01-07 ENCOUNTER — Other Ambulatory Visit: Payer: Self-pay

## 2015-01-07 DIAGNOSIS — G40209 Localization-related (focal) (partial) symptomatic epilepsy and epileptic syndromes with complex partial seizures, not intractable, without status epilepticus: Secondary | ICD-10-CM

## 2015-01-07 DIAGNOSIS — G40319 Generalized idiopathic epilepsy and epileptic syndromes, intractable, without status epilepticus: Secondary | ICD-10-CM

## 2015-01-07 MED ORDER — VIMPAT 100 MG PO TABS: ORAL_TABLET | ORAL | Status: DC

## 2015-01-07 NOTE — Telephone Encounter (Signed)
Please enter recall. Patient needs f/u in November according to last office note. Thanks

## 2015-01-10 ENCOUNTER — Other Ambulatory Visit: Payer: Self-pay | Admitting: Family

## 2015-01-10 ENCOUNTER — Other Ambulatory Visit: Payer: Self-pay

## 2015-01-10 DIAGNOSIS — G40319 Generalized idiopathic epilepsy and epileptic syndromes, intractable, without status epilepticus: Secondary | ICD-10-CM

## 2015-01-10 DIAGNOSIS — G40209 Localization-related (focal) (partial) symptomatic epilepsy and epileptic syndromes with complex partial seizures, not intractable, without status epilepticus: Secondary | ICD-10-CM

## 2015-01-10 MED ORDER — OXTELLAR XR 600 MG PO TB24: ORAL_TABLET | ORAL | Status: DC

## 2015-01-10 MED ORDER — VIMPAT 200 MG PO TABS: ORAL_TABLET | ORAL | Status: DC

## 2015-01-31 ENCOUNTER — Ambulatory Visit (INDEPENDENT_AMBULATORY_CARE_PROVIDER_SITE_OTHER): Payer: Medicaid Other | Admitting: Pediatrics

## 2015-01-31 ENCOUNTER — Encounter: Payer: Self-pay | Admitting: Pediatrics

## 2015-01-31 VITALS — BP 116/74 | Ht 60.75 in | Wt 172.1 lb

## 2015-01-31 DIAGNOSIS — Z23 Encounter for immunization: Secondary | ICD-10-CM

## 2015-01-31 DIAGNOSIS — Z0000 Encounter for general adult medical examination without abnormal findings: Secondary | ICD-10-CM

## 2015-01-31 DIAGNOSIS — Z00129 Encounter for routine child health examination without abnormal findings: Secondary | ICD-10-CM

## 2015-01-31 MED ORDER — AMLODIPINE BESYLATE 5 MG PO TABS: 5.0000 mg | ORAL_TABLET | Freq: Two times a day (BID) | ORAL | Status: AC

## 2015-01-31 MED ORDER — DERMA-SMOOTHE/FS BODY 0.01 % EX OIL: TOPICAL_OIL | CUTANEOUS | Status: AC

## 2015-01-31 MED ORDER — CETIRIZINE HCL 10 MG PO TABS: 10.0000 mg | ORAL_TABLET | Freq: Every day | ORAL | Status: DC

## 2015-01-31 MED ORDER — DERMA-SMOOTHE/FS SCALP 0.01 % EX OIL: TOPICAL_OIL | CUTANEOUS | Status: AC

## 2015-01-31 NOTE — Patient Instructions (Signed)

## 2015-01-31 NOTE — Progress Notes (Signed)
Zachary [name] is a 24 y.o. male who presents for well check and filling out of yearly forms for continued medical care and school.  Present issues: 1.  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  3. He takes four antiepileptic medications and also has a vagal nerve stimulator.  4. He has an episodes of sleep apnea caused by the VNS, not prolonged enough to cause desaturation now on CPAP device to help wit sleep 5. Hypertension   Past Medical History Diagnosis Date  . Seizures   . MR (mental retardation)   . CP (cerebral palsy)   . Hypertension   . Fracture of femur, intertrochanteric, closed   . Recurrent apnea   . 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 .   . 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 Theressa Stamps. Patient's AHI was          Routine Well-Adolescent Visit   PCP: [name], MD   History was provided by the mother.  Zachary [name] is a 24 y.o. male who is here for well visit.   Current concerns: see above medical conditions   Adolescent Assessment:  Confidentiality was discussed with the patient and if applicable, with caregiver as well.  Home and Environment:  Lives with: lives in a group home under the supervision of mother Parental relations: good Friends/Peers: stable Nutrition/Eating Behaviors: okay Sports/Exercise:  poor  Education and Employment:  School Status: Mental retardation wit CP and seizures School History: special ed Work: n/a Activities:   With parent out of the room and confidentiality discussed:   Patient reports being comfortable and safe at school and at home? Yes  Smoking: no Secondhand  smoke exposure? no Drugs/EtOH: no   Sexuality:   - Sexually active? no    - Violence/Abuse: no  Mood: Suicidality and Depression: no   Screenings:  In addition, the following topics were discussed as part of anticipatory guidance healthy eating, exercise, seatbelt use, abuse/trauma, tobacco use, marijuana use and drug use.  PHQ-9 completed and results indicated N/A  Physical Exam:  BP 116/74 mmHg  Ht 5' 0.75" (1.543 m)  Wt 172 lb 1.6 oz (78.064 kg)  BMI 32.79 kg/m2 Facility age limit for growth percentiles is 20 years.  General Appearance:   well nourished  HENT: Normocephalic, no obvious abnormality, PERRL, EOM's intact, conjunctiva clear  Mouth:   Normal appearing teeth, no obvious discoloration, dental caries, or dental caps  Neck:   Supple; thyroid: no enlargement, symmetric, no tenderness/mass/nodules  Lungs:   Clear to auscultation bilaterally, normal work of breathing  Heart:   Regular rate and rhythm, S1 and S2 normal, no murmurs;   Abdomen:   Soft, non-tender, no mass, or organomegaly  GU normal male genitals, no testicular masses or hernia  Musculoskeletal:   Tone and strength strong and symmetrical, all extremities               Lymphatic:   No cervical adenopathy  Skin/Hair/Nails:   Skin warm, dry and intact, no rashes, no bruises or petechiae  Neurologic:   Strength, gait, and coordination normal and age-appropriate    Assessment/Plan:  BMI: is appropriate for age  Immunizations today: per orders. History of previous adverse reactions to immunizations? no Counseling completed for all  of the vaccine components. Orders Placed This Encounter  Procedures  . Flu Vaccine QUAD with presevative   - Follow-up visit in 1 year for next visit, or sooner as needed.   [name], MD

## 2015-02-09 ENCOUNTER — Other Ambulatory Visit: Payer: Self-pay

## 2015-02-09 DIAGNOSIS — R451 Restlessness and agitation: Secondary | ICD-10-CM

## 2015-02-09 MED ORDER — LORAZEPAM 1 MG PO TABS: ORAL_TABLET | ORAL | Status: DC

## 2015-03-17 NOTE — Telephone Encounter (Signed)
, °

## 2015-03-26 ENCOUNTER — Ambulatory Visit (INDEPENDENT_AMBULATORY_CARE_PROVIDER_SITE_OTHER): Payer: Medicaid Other | Admitting: Pediatrics

## 2015-03-26 DIAGNOSIS — H6593 Unspecified nonsuppurative otitis media, bilateral: Secondary | ICD-10-CM | POA: Diagnosis not present

## 2015-03-26 DIAGNOSIS — K5900 Constipation, unspecified: Secondary | ICD-10-CM | POA: Diagnosis not present

## 2015-03-26 DIAGNOSIS — K5909 Other constipation: Secondary | ICD-10-CM

## 2015-03-26 MED ORDER — DERMA-SMOOTHE/FS BODY 0.01 % EX OIL: 1.0000 "application " | TOPICAL_OIL | Freq: Every day | CUTANEOUS | Status: AC | PRN

## 2015-03-26 MED ORDER — SULFAMETHOXAZOLE-TRIMETHOPRIM 400-80 MG PO TABS: 1.0000 | ORAL_TABLET | Freq: Two times a day (BID) | ORAL | Status: DC

## 2015-03-26 MED ORDER — MUPIROCIN 2 % EX OINT: 1.0000 "application " | TOPICAL_OINTMENT | Freq: Two times a day (BID) | CUTANEOUS | Status: DC

## 2015-03-26 NOTE — Patient Instructions (Signed)

## 2015-03-27 ENCOUNTER — Encounter: Payer: Self-pay | Admitting: Pediatrics

## 2015-03-27 DIAGNOSIS — H6590 Unspecified nonsuppurative otitis media, unspecified ear: Secondary | ICD-10-CM | POA: Insufficient documentation

## 2015-03-27 NOTE — Progress Notes (Signed)
Subjective   Zachary [name], 24 y.o. male, with MRCP/Developmental delay who presents with irritability and slapping his ears for the past day.  Symptoms started 1 days ago.  He is taking fluids well.  Mom says that he has chronic constipation and wanted him evaluated by GI for follow up  The patient's history has been marked as reviewed and updated as appropriate.  Objective   There were no vitals taken for this visit.  General appearance:  well developed and well nourished and well hydrated  Nasal: Neck:  Mild nasal congestion with clear rhinorrhea Neck is supple  Ears:  External ears are normal Right TM - erythematous, dull and bulging Left TM - erythematous, dull and bulging  Oropharynx:  Mucous membranes are moist; there is mild erythema of the posterior pharynx  Lungs:  Lungs are clear to auscultation  Heart:  Regular rate and rhythm; no murmurs or rubs  Skin:  No rashes or lesions noted   Assessment   Acute bilateral otitis media  Chronic constipation  Plan   1) Antibiotics per orders 2) Fluids, acetaminophen as needed 3) Recheck if symptoms persist for 2 or more days, symptoms worsen, or new symptoms develop. 4) Refer to GI for constipation

## 2015-03-28 ENCOUNTER — Telehealth: Payer: Self-pay | Admitting: Pediatrics

## 2015-03-28 MED ORDER — SULFAMETHOXAZOLE-TRIMETHOPRIM 800-160 MG PO TABS: 1.0000 | ORAL_TABLET | Freq: Two times a day (BID) | ORAL | Status: AC

## 2015-03-28 NOTE — Telephone Encounter (Signed)
Patient was seen in our office Saturday for an ear infection and mother has questions about meds

## 2015-03-28 NOTE — Telephone Encounter (Signed)
Called in double strength bactrim

## 2015-03-29 NOTE — Addendum Note (Signed)
Addended by: Saul FordyceLOWE, CRYSTAL M on: 03/29/2015 11:57 AM   Modules accepted: Orders

## 2015-04-05 ENCOUNTER — Encounter: Payer: Self-pay | Admitting: Pediatrics

## 2015-04-05 ENCOUNTER — Ambulatory Visit (INDEPENDENT_AMBULATORY_CARE_PROVIDER_SITE_OTHER): Payer: Medicaid Other | Admitting: Pediatrics

## 2015-04-05 VITALS — BP 126/84 | HR 68 | Ht 60.0 in | Wt 176.4 lb

## 2015-04-05 DIAGNOSIS — G4731 Primary central sleep apnea: Secondary | ICD-10-CM

## 2015-04-05 DIAGNOSIS — G4737 Central sleep apnea in conditions classified elsewhere: Secondary | ICD-10-CM

## 2015-04-05 DIAGNOSIS — G808 Other cerebral palsy: Secondary | ICD-10-CM | POA: Diagnosis not present

## 2015-04-05 DIAGNOSIS — G4733 Obstructive sleep apnea (adult) (pediatric): Secondary | ICD-10-CM

## 2015-04-05 DIAGNOSIS — G40209 Localization-related (focal) (partial) symptomatic epilepsy and epileptic syndromes with complex partial seizures, not intractable, without status epilepticus: Secondary | ICD-10-CM | POA: Diagnosis not present

## 2015-04-05 DIAGNOSIS — F71 Moderate intellectual disabilities: Secondary | ICD-10-CM | POA: Diagnosis not present

## 2015-04-05 DIAGNOSIS — F840 Autistic disorder: Secondary | ICD-10-CM | POA: Diagnosis not present

## 2015-04-05 NOTE — Progress Notes (Signed)
Patient: ONAJE [name] MRN: [id number] Sex: male DOB: 1990-08-05  Provider: Deetta Perla, MD Location of Care: [name]  Note type: Routine return visit  History of Present Illness: Referral Source: Dr. [name] History from: mother and Zachary [name] chart Chief Complaint: Seizures  Zachary [name] is a 24 y.o. male who returns April 05, 2015, for the first time since Oct 12, 2014.  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  He takes four antiepileptic medications and also a vagal nerve stimulator.  As time has passed, his seizure control has improved.  He has not experienced any seizures in the past six weeks.  He is very active during the day.  He falls quickly asleep between 9:30 and 10.  It is not uncommon for him to wet himself once during the night, but after he is changed, he quickly goes back to sleep.  He is awake around 6:30 in the morning when his mother comes in to get him.  He had an otitis media two weeks ago.  He has otherwise been healthy.  He attends [employer].  He is not wheelchair bound although his mother uses a wheelchair to transport him from one place to another.  Procedure: interrogation of vagal nerve stimulator  The implanted device is series 102, serial [id number], implanted May 23, 2011.  On interrogation: amplitude 2.50 mA, frequency 20 Hz, pulse width 250 s, stimulation duration 7 seconds, stimulation interval 0.8 minutes, magnet amplitude 2.75 mA, magnet stimulation duration 60 seconds, magnet pulse width 250 s.  Number of stimuli since last visit is 18; total number since implantation: 583.  Normal diagnostics with an amplitude of 2.50 milliamps again showed intact  communication, output, and impedance. DC/ DC conversion code was 3. There is no indication to change the battery.  I reprogrammed the device to stimulate every 0.8 minutes. I decreased the stimulus to 7 seconds in duration.  Review of Systems: 12 system review was unremarkable  Past Medical History Diagnosis Date  . Seizures (HCC)   . MR (mental retardation)   . CP (cerebral palsy) (HCC)   . Hypertension   . Fracture of femur, intertrochanteric, closed (HCC)   . Recurrent apnea   . 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 .   . 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 Theressa Stamps. Patient's AHI was 39.6 RDI is 43.6 positional component. Patient will need desensitization and a gentle approach to CPAP titration.    Hospitalizations: No., Head Injury: No., Nervous System Infections: No., Immunizations up to date: Yes.    I began to see him on July 10, 2004. He apparently was seen by me when he was eight months of age with developmental delay. Seizure activity; however, did not began until he is 14, two days after starting amitriptyline. 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 His most recent CT scan of the brain was on September 29, 2009, and showed no significant changes.  EEG showed diffuse background slowing.   He was  treated with Depakote, Topamax, and Lyrica, but suffered side effects and agitation from those medications. He has taken Trileptal, Keppra, and Vimpat for quite some time. He was seen at [name] on Oct 27, 2010 for 6 days.for a phase 1 evaluation. MRI of the brain showed ex-vacuo dilatation of his ventricles and cortical atrophy.  Volume loss most pronounced in the parietal lobes bilaterally, temporal lobes with widening of the sylvian fissures and a paucity of white matter diffusely most pronounced again in the parietal lobes with patchy T2 hyperintensity. 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 There was no evidence of mesial temporal sclerosis.  Magnetoencephalogram showed epileptiform activity arising from the left posterior temple region with a field extending to the occipital lobe. This was not seen with concurrent routine EEG.  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 This was somewhat worse in the right than the left bilateral temporal cortex and temporal pole suggesting a longstanding insult.  Prolonged video EEG showed onset of bifrontal slowing that evolved into rhythmic activity with spikes of greater amplitude over the right central region with secondary generalization. Clinically, the patient has onset of a myoclonic jerk with his head turned to the left, movement of the left arm and leg, then tonic posturing followed by tonic-clonic generalized movements.  One day later the patient had bifrontal rhythmic beta range activity followed by decrement in voltage with fast beta range activity for about 30 seconds and then generalized rhythmic beta range activity. The patient had a slight head bob slowly lowered to the bed with stiffening of both upper extremities and tonic-clonic movement of both upper extremities followed by secondary generalization in his legs. A total of seven seizures were monitored. The last three were obscured by electrode artifact. Seizure onset appeared to occur from both frontal regions. This led to the recommendations to place the vagal nerve stimulator.  .  He has frequent ear infections, urinary tract infections, constipation and a hip fracture. He has  had problems with neutropenia and thrombocytopenia. He had some vomiting in June, 2010 and developed aspiration pneumonia. He tends to have a temperature of 99 in the late evening. He has had several episodes in which his left knee has been dislocated. This happened as recently as August 28, 2009. He sat down in a chair and his knee went out of place. EMS was called and he was taken to the ER. While waiting for the ER physician, Zachary [name] stretched and his knee went back in to place. His mother said that he was subsequently seen by his orthopedist, who thinks that he may eventually need surgery on the knee.   Behavior History none  Surgical History Procedure Laterality Date  . Hip surgery    . Foot surgery    . Implantation vagal nerve stimulator    . Tympanostomy tube placement  2009  . Tonsillectomy  2009  . Pediatomy  2009  . Fracture surgery     Family History family history includes Cancer in his paternal grandmother; Diabetes in his paternal grandfather. Family history is negative for migraines, seizures, intellectual disabilities, blindness, deafness, birth defects, chromosomal disorder, or autism.  Social History . Marital Status: Single    Spouse Name: N/A  . Number of Children: N/A  . Years of Education: N/A   Social History Main Topics  . Smoking status: Never Smoker   .  Smokeless tobacco: Never Used  . Alcohol Use: No  . Drug Use: No  . 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   Allergies Allergen Reactions  . Depakote [Divalproex Sodium] Other (See Comments)    Low Platelets  . Lyrica [Pregabalin] Other (See Comments)    Sleepiness  . Penicillins Hives and Rash  . Topamax [Topiramate] Other (See Comments)    Weight Loss   Physical Exam BP 126/84 mmHg  Pulse 68  Ht 5' (1.524 m)  Wt 176 lb 6.4 oz (80.015 kg)  BMI 34.45 kg/m2  General: well developed, obese young man, seated  in wheelchair in exam room.  Head: head normocephalic and atraumatic.  Ears, Nose and Throat: oropharynx benign. 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  Neck: supple with no carotid or supraclavicular bruits.  Respiratory: lung clear to auscultation.  Cardiovascular: regular rate and rhythm, no murmurs; he has edema in both feet  Skin: facial acne. He has well healed surgical scars on his left neck and his left anterior chest from the VNS  Trunk: truncal obesity  Neurologic Exam  Mental Status: Awake and alert. He does not make eye contact. He does not speak. He resists some efforts at examination. He is unable to follow commands. He has very limited language.  Cranial Nerves: Fundoscopic exam reveals poorly visualized disc margins. Pupils equal, briskly reactive to light. Extraocular movements bilateral exotropia without nystagmus; disconjugate gaze with amblyopia on the right. He does not regard objects in the left visual field as well as the right, he has difficulties judging distance when coordinating movements. Hearing appears intact as he turns to localize sounds. Neck flexion and extension normal after VNS was implanted .  Motor: Normal bulk with increased tone in all extremities. decreased strength with formal strength testing in all extremities. wheelchair for transport . Clumsy fine motor movements. 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  Sensory: Intact to withdrawal x4.  Coordination: Poor quality fine motor movements.  Gait and Station: Arises from wheelchair with mild difficulty. Stance is narrow based. Gait demonstrates diplegic gait that is slow with shortened stride length. Varus position of his feet . He walked fairly well today without any assistance.  Reflexes: Diminished and symmetric. Toes downgoing. He had no clonus today.  Assessment 1. Complex partial seizures involving to  generalized tonic-clonic seizures, G40.209. 2. 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 3. 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 4. Complex sleep apnea syndrome, G47.33, G47.37. 5. 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  Discussion Giavonni is doing well as regards to his seizures.  There has been no significant change in his static encephalopathy.  His general health has been good.  He has had no significant side effects from his antiepileptic medications.  Plan He did not need medications refilled today.  I evaluated his vagal nerve stimulator, which is noted above.  He tolerated that procedure well.  I made no changes in his stimulation parameters.  He will return to see me in three months' time.  I spent 15 minutes of face-to-face time with Zachary [name] and in addition interrogated his vagal nerve stimulator.   Medication List   This list is accurate as of: 04/05/15 11:59 PM.       albuterol (2.5 MG/3ML) 0.083% nebulizer solution  Commonly known as:  PROVENTIL  Take 3 mLs (2.5 mg total) by nebulization every 6 (six) hours as needed for wheezing or shortness of breath.     cetirizine 10 MG tablet  Commonly known as:  ZYRTEC  Take 1 tablet (10 mg total) by mouth daily as needed for allergies.     DERMA-SMOOTHE/FS SCALP 0.01 % Oil  APPLY TWICE DAILY     DERMA-SMOOTHE/FS BODY 0.01 % Oil  Apply 1 application topically daily as needed (for dry skin).     DIASTAT ACUDIAL 20 MG Gel  Generic drug:  diazepam  Give 15 mg rectally after 2 minutes of seizure.     docusate sodium 100 MG capsule  Commonly known as:  COLACE  Take 200 mg by mouth daily.     fluticasone 50 MCG/ACT nasal spray  Commonly known as:  FLONASE  Place 1 spray into both nostrils 2 (two) times daily.     hydrochlorothiazide 12.5 MG capsule  Commonly known as:  MICROZIDE  Take 12.5 mg by mouth 2 (two) times daily.     KEPPRA 1000 MG tablet  Generic  drug:  levETIRAcetam  Take 1 and 1/2 tablets twice per day     LORazepam 1 MG tablet  Commonly known as:  ATIVAN  Take 1 tablet by mouth as needed for agitation.     midazolam 5 MG/ML injection  Commonly known as:  VERSED  Place 2 mLs (10 mg total) into the nose once. Draw up 1ml in 2 syringes. Remove blue vial access device. Attach syringe to nasal atomizer for intranasal administration. Give 1ml in each nostril for seizures lasting 2 minutes or longer.     mupirocin ointment 2 %  Commonly known as:  BACTROBAN  Apply 1 application topically 2 (two) times daily. To affected skin.     OXTELLAR XR 600 MG Tb24  Generic drug:  OXcarbazepine ER  Take 3 tablets at bedtime     polyethylene glycol packet  Commonly known as:  MIRALAX / GLYCOLAX  Take 17 g by mouth daily as needed for mild constipation. Can increase to 4 capfuls a day if needed     VIMPAT 100 MG Tabs  Generic drug:  Lacosamide  Take 1 tab by mouth in the morning and 1 tab at 3pm     VIMPAT 200 MG Tabs tablet  Generic drug:  lacosamide  Take 2 tabs by mouth at bedtime      The medication list was reviewed and reconciled. All changes or newly prescribed medications were explained.  A complete medication list was provided to the patient/caregiver.  Deetta PerlaWilliam H Tamakia Porto MD

## 2015-04-08 ENCOUNTER — Telehealth: Payer: Self-pay | Admitting: Pediatrics

## 2015-04-08 NOTE — Telephone Encounter (Signed)
Form filled

## 2015-04-08 NOTE — Telephone Encounter (Signed)
Form on your desk to fill out

## 2015-04-14 ENCOUNTER — Other Ambulatory Visit: Payer: Self-pay

## 2015-04-14 DIAGNOSIS — G40209 Localization-related (focal) (partial) symptomatic epilepsy and epileptic syndromes with complex partial seizures, not intractable, without status epilepticus: Secondary | ICD-10-CM

## 2015-04-14 DIAGNOSIS — G40319 Generalized idiopathic epilepsy and epileptic syndromes, intractable, without status epilepticus: Secondary | ICD-10-CM

## 2015-04-14 MED ORDER — KEPPRA 1000 MG PO TABS: ORAL_TABLET | ORAL | Status: DC

## 2015-05-09 ENCOUNTER — Telehealth: Payer: Self-pay

## 2015-05-09 ENCOUNTER — Encounter: Payer: Self-pay | Admitting: Family

## 2015-05-09 ENCOUNTER — Ambulatory Visit (INDEPENDENT_AMBULATORY_CARE_PROVIDER_SITE_OTHER): Payer: Medicaid Other | Admitting: Family

## 2015-05-09 DIAGNOSIS — H6504 Acute serous otitis media, recurrent, right ear: Secondary | ICD-10-CM

## 2015-05-09 MED ORDER — SULFAMETHOXAZOLE-TRIMETHOPRIM 800-160 MG PO TABS: 1.0000 | ORAL_TABLET | Freq: Two times a day (BID) | ORAL | Status: AC

## 2015-05-09 NOTE — Telephone Encounter (Signed)
Mother needs a note from patients PCP. Mom says his funds are about to be cut and mom needs letter stating about his problems. Ex: disability, frequent ear infections and other problems that Zachary Ruizjohn has faced or faces. Mother needs letter before the 1st of the year. If you have any questions mother says to give her a call.

## 2015-05-09 NOTE — Patient Instructions (Signed)

## 2015-05-09 NOTE — Progress Notes (Signed)
24 y.o. Male with autism and developmental delay presents with mother for complaint of pulling at ears and cough. Symptoms include: congestion, cough, ear pain, hitting ears. Onset of symptoms was 2 days ago. Symptoms have been gradually worsening since that time. Past history is significant for  history of pneumonia. Patient is a non-smoker.  The following portions of the patient's history were reviewed and updated as appropriate: allergies, current medications, past family history, past medical history, past social history, past surgical history and problem list.  Review of Systems Pertinent items are noted in HPI.   Objective:    General Appearance:    Alert, cooperative, no distress, appears stated age  Head:    Normocephalic, without obvious abnormality, atraumatic  Eyes:    PERRL, conjunctiva/corneas clear  Ears:    Right TM dull bulginh and erythematous   Nose:   Nares normal, septum midline, mucosa red and swollen with mucoid drainage     Throat:   Lips, mucosa, and tongue normal; teeth and gums normal        Lungs:     Clear to auscultation bilaterally, respirations unlabored     Heart:    Regular rate and rhythm, S1 and S2 normal, no murmur, rub   or gallop                 Skin:   Skin color, texture, turgor normal, no rashes or lesions            Assessment:    Acute otitis media, right ear    Plan:   Bactrim as prescribed for 7 days  Tylenol or ibuprofen as needed.  Follow up if symptoms worsen or fail to improve.

## 2015-05-17 ENCOUNTER — Telehealth: Payer: Self-pay | Admitting: *Deleted

## 2015-05-17 ENCOUNTER — Telehealth: Payer: Self-pay | Admitting: Pediatrics

## 2015-05-17 NOTE — Telephone Encounter (Signed)
Letter for continued care

## 2015-05-17 NOTE — Telephone Encounter (Signed)
Patient's mother called and states that she needs a letter about Stacy's health and seizure activity for [facility] because his funding is being cut. They want to know why he needs all the services he has.   CB: [phone] appt with us February 1st.

## 2015-05-17 NOTE — Telephone Encounter (Signed)
letter for continued care

## 2015-05-17 NOTE — Telephone Encounter (Signed)
I called Mom Aquilla Solianonya Mumford and let her know that I will write a letter for Dr Sharene SkeansHickling to sign and will let her know when it is ready to be picked up. TG

## 2015-05-17 NOTE — Telephone Encounter (Signed)
Mrs Malen GauzeFoster needs a letter with all of Zachary [name]'s diagnosis on it. Please put everything on it you can. They want to cut his funding and she needs a letter so they want. She talked to [place] about it if you have any questions.

## 2015-05-18 NOTE — Telephone Encounter (Signed)
I called Mom to let her know that the letter was signed and ready to be picked up. TG

## 2015-05-19 ENCOUNTER — Other Ambulatory Visit: Payer: Self-pay | Admitting: Pediatrics

## 2015-05-24 ENCOUNTER — Ambulatory Visit (INDEPENDENT_AMBULATORY_CARE_PROVIDER_SITE_OTHER): Payer: Medicaid Other | Admitting: Pediatrics

## 2015-05-24 ENCOUNTER — Encounter: Payer: Self-pay | Admitting: Pediatrics

## 2015-05-24 VITALS — Wt 169.0 lb

## 2015-05-24 DIAGNOSIS — J310 Chronic rhinitis: Secondary | ICD-10-CM | POA: Diagnosis not present

## 2015-05-24 MED ORDER — FLUTICASONE PROPIONATE 50 MCG/ACT NA SUSP: 1.0000 | Freq: Every day | NASAL | Status: DC

## 2015-05-24 MED ORDER — HYDROXYZINE HCL 25 MG PO TABS: 25.0000 mg | ORAL_TABLET | Freq: Two times a day (BID) | ORAL | Status: AC | PRN

## 2015-05-24 MED ORDER — ALBUTEROL SULFATE (2.5 MG/3ML) 0.083% IN NEBU: 2.5000 mg | INHALATION_SOLUTION | Freq: Four times a day (QID) | RESPIRATORY_TRACT | Status: DC | PRN

## 2015-05-24 NOTE — Progress Notes (Signed)
Subjective:     Zachary [name] is a 24 y.o. male with history of mental retardation and cerebral palsy who presents for evaluation and treatment of recurrent nasal congestion. Mom had decided to stop the zyrtec because it was making his skin too dry and he was scratching all the time. Symptoms include: clear rhinorrhea, cough, nasal congestion, postnasal drip and sneezing and are present in a seasonal pattern. Precipitants include: grass. Treatment currently includes nasal saline and is not effective. The following portions of the patient's history were reviewed and updated as appropriate: allergies, current medications, past family history, past medical history, past social history, past surgical history and problem list.  Review of Systems Pertinent items are noted in HPI.    Objective:    Wt 169 lb (76.658 kg) General appearance: basline mental satus in wheelchair Ears: abnormal external canal right ear - debris in canal Nose: Nares normal. Septum midline. Mucosa normal. No drainage or sinus tenderness., clear discharge, mild congestion, turbinates swollen Throat: unable to examine Neck: no adenopathy, supple, symmetrical, trachea midline and thyroid not enlarged, symmetric, no tenderness/mass/nodules Lungs: clear to auscultation bilaterally Heart: regular rate and rhythm, S1, S2 normal, no murmur, click, rub or gallop Skin: Skin color, texture, turgor normal. No rashes or lesions Neurologic: Mental status: Alert, oriented, thought content appropriate, baseline stable for MRCP    Assessment:    Allergic rhinitis.    Plan:    Medications: intranasal steroids: flonase, oral antihistamines: claritin. Allergen avoidance discussed. Follow-up in a few weeks.

## 2015-05-24 NOTE — Patient Instructions (Signed)
Allergic Rhinitis Allergic rhinitis is when the mucous membranes in the nose respond to allergens. Allergens are particles in the air that cause your body to have an allergic reaction. This causes you to release allergic antibodies. Through a chain of events, these eventually cause you to release histamine into the blood stream. Although meant to protect the body, it is this release of histamine that causes your discomfort, such as frequent sneezing, congestion, and an itchy, runny nose.  CAUSES Seasonal allergic rhinitis (hay fever) is caused by pollen allergens that may come from grasses, trees, and weeds. Year-round allergic rhinitis (perennial allergic rhinitis) is caused by allergens such as house dust mites, pet dander, and mold spores. SYMPTOMS  Nasal stuffiness (congestion).  Itchy, runny nose with sneezing and tearing of the eyes. DIAGNOSIS Your health care provider can help you determine the allergen or allergens that trigger your symptoms. If you and your health care provider are unable to determine the allergen, skin or blood testing may be used. Your health care provider will diagnose your condition after taking your health history and performing a physical exam. Your health care provider may assess you for other related conditions, such as asthma, pink eye, or an ear infection. TREATMENT Allergic rhinitis does not have a cure, but it can be controlled by:  Medicines that block allergy symptoms. These may include allergy shots, nasal sprays, and oral antihistamines.  Avoiding the allergen. Hay fever may often be treated with antihistamines in pill or nasal spray forms. Antihistamines block the effects of histamine. There are over-the-counter medicines that may help with nasal congestion and swelling around the eyes. Check with your health care provider before taking or giving this medicine. If avoiding the allergen or the medicine prescribed do not work, there are many new medicines  your health care provider can prescribe. Stronger medicine may be used if initial measures are ineffective. Desensitizing injections can be used if medicine and avoidance does not work. Desensitization is when a patient is given ongoing shots until the body becomes less sensitive to the allergen. Make sure you follow up with your health care provider if problems continue. HOME CARE INSTRUCTIONS It is not possible to completely avoid allergens, but you can reduce your symptoms by taking steps to limit your exposure to them. It helps to know exactly what you are allergic to so that you can avoid your specific triggers. SEEK MEDICAL CARE IF:  You have a fever.  You develop a cough that does not stop easily (persistent).  You have shortness of breath.  You start wheezing.  Symptoms interfere with normal daily activities.   This information is not intended to replace advice given to you by your health care provider. Make sure you discuss any questions you have with your health care provider.   Document Released: 02/13/2001 Document Revised: 06/11/2014 Document Reviewed: 01/26/2013 Elsevier Interactive Patient Education 2016 [employer]  

## 2015-06-07 ENCOUNTER — Other Ambulatory Visit: Payer: Self-pay

## 2015-06-07 DIAGNOSIS — G40209 Localization-related (focal) (partial) symptomatic epilepsy and epileptic syndromes with complex partial seizures, not intractable, without status epilepticus: Secondary | ICD-10-CM

## 2015-06-07 DIAGNOSIS — G40319 Generalized idiopathic epilepsy and epileptic syndromes, intractable, without status epilepticus: Secondary | ICD-10-CM

## 2015-06-07 MED ORDER — VIMPAT 100 MG PO TABS: ORAL_TABLET | ORAL | Status: DC

## 2015-06-14 ENCOUNTER — Telehealth: Payer: Self-pay | Admitting: *Deleted

## 2015-06-14 ENCOUNTER — Other Ambulatory Visit: Payer: Self-pay | Admitting: Family

## 2015-06-14 DIAGNOSIS — G40319 Generalized idiopathic epilepsy and epileptic syndromes, intractable, without status epilepticus: Secondary | ICD-10-CM

## 2015-06-14 DIAGNOSIS — G40209 Localization-related (focal) (partial) symptomatic epilepsy and epileptic syndromes with complex partial seizures, not intractable, without status epilepticus: Secondary | ICD-10-CM

## 2015-06-14 MED ORDER — VIMPAT 200 MG PO TABS: ORAL_TABLET | ORAL | Status: DC

## 2015-06-14 NOTE — Telephone Encounter (Signed)
Patient's mother called and states that she forgot to give Zachary [name] his Oxtellar and Vimpat last night and he had two small seizures. She states that she knows that she is to blame for this but would like to know if she should go ahead and give him the missed dose or wait until tonight to give programmed dose. Please advise.  CB: [phone]

## 2015-06-14 NOTE — Telephone Encounter (Signed)
I recommended doubling up on the doses that were missed and to give those in addition to his current treatment.  He may get somewhat sleepy, but he will take several days for him to return to steady state if this dose is completely missed.

## 2015-07-06 ENCOUNTER — Encounter: Payer: Self-pay | Admitting: Pediatrics

## 2015-07-06 ENCOUNTER — Ambulatory Visit (INDEPENDENT_AMBULATORY_CARE_PROVIDER_SITE_OTHER): Payer: Medicaid Other | Admitting: Pediatrics

## 2015-07-06 VITALS — BP 106/78 | HR 92

## 2015-07-06 DIAGNOSIS — F840 Autistic disorder: Secondary | ICD-10-CM | POA: Diagnosis not present

## 2015-07-06 DIAGNOSIS — G808 Other cerebral palsy: Secondary | ICD-10-CM

## 2015-07-06 DIAGNOSIS — G40209 Localization-related (focal) (partial) symptomatic epilepsy and epileptic syndromes with complex partial seizures, not intractable, without status epilepticus: Secondary | ICD-10-CM | POA: Diagnosis not present

## 2015-07-06 DIAGNOSIS — G40319 Generalized idiopathic epilepsy and epileptic syndromes, intractable, without status epilepticus: Secondary | ICD-10-CM | POA: Diagnosis not present

## 2015-07-06 MED ORDER — KEPPRA 1000 MG PO TABS: ORAL_TABLET | ORAL | Status: DC

## 2015-07-06 MED ORDER — VIMPAT 100 MG PO TABS: ORAL_TABLET | ORAL | Status: DC

## 2015-07-06 MED ORDER — VIMPAT 200 MG PO TABS: ORAL_TABLET | ORAL | Status: DC

## 2015-07-06 NOTE — Progress Notes (Signed)
Patient: Zachary [name] MRN: [id number] Sex: male DOB: Mar 21, 1991  Provider: Deetta Perla, MD Location of Care: [facility] [address] [facility]  Note type: Routine return visit  History of Present Illness: Referral Source: [name], MD History from: mother and [facility] chart Chief Complaint: VNS/Epilepsy  Zachary [name] is a 25 y.o. male who was evaluated on July 06, 2015 for the first time since April 05, 2015.  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  He takes four antiepileptic medications and has a vagal nerve stimulator.  His seizure control has been quite good.  On June 13, 2015, he had a seizures when his mother forgot to treating with medication.  He also had a single episode this past Sunday.  In general; however, this is the best seizure control that Zachary [name] has had.  He has good appetite.  He is sleeping well.  His health has been good.  His mother had no other concerns today.  Procedure: interrogation of vagal nerve stimulator  The implanted device is series 102, serial [id number], implanted May 23, 2011.  On interrogation: amplitude 2.50 mA, frequency 20 Hz, pulse width 250 s, stimulation duration 7 seconds, stimulation interval 0.8 minutes, magnet amplitude 2.75 mA, magnet stimulation duration 60 seconds, magnet pulse width 250 s.  Number of stimuli since last visit is 23; total number since implantation: 606.  Normal diagnostics with an amplitude of 2.50 milliamps again showed intact communication, output, and impedance. DC/ DC conversion code was 3. There is no indication to change the battery.  Review of Systems: 12 system review was unremarkable except as noted below  Past Medical History Diagnosis Date  . Seizures  (HCC)   . MR (mental retardation)   . CP (cerebral palsy) (HCC)   . Hypertension   . Fracture of femur, intertrochanteric, closed (HCC)   . Recurrent apnea   . 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 .   . 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 Theressa Stamps. Patient's AHI was 39.6 RDI is 43.6 positional component. Patient will need desensitization and a gentle approach to CPAP titration.    Hospitalizations: No., Head Injury: No., Nervous System Infections: No., Immunizations up to date: Yes.    I began to see him on July 10, 2004. He apparently was seen by me when he was eight months of age with developmental delay. Seizure activity; however, did not began until he is 14, two days after starting amitriptyline. 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 His most recent CT scan of the brain was on September 29, 2009, and showed no significant changes.  EEG showed diffuse background slowing.   He was treated with Depakote, Topamax, and Lyrica, but suffered side effects and agitation from those medications. He has taken Trileptal, Keppra, and Vimpat for quite some time. He was seen at [facility] on Oct 27, 2010 for 6 days.for a phase 1 evaluation. MRI of the brain showed ex-vacuo dilatation of his ventricles and cortical atrophy. Volume loss most pronounced in the parietal  lobes bilaterally, temporal lobes with widening of the sylvian fissures and a paucity of white matter diffusely most pronounced again in the parietal lobes with patchy T2 hyperintensity. 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 There was no evidence of mesial temporal  sclerosis.  Magnetoencephalogram showed epileptiform activity arising from the left posterior temple region with a field extending to the occipital lobe. This was not seen with concurrent routine EEG.  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 This was somewhat worse in the right than the left bilateral temporal cortex and temporal pole suggesting a longstanding insult.  Prolonged video EEG showed onset of bifrontal slowing that evolved into rhythmic activity with spikes of greater amplitude over the right central region with secondary generalization. Clinically, the patient has onset of a myoclonic jerk with his head turned to the left, movement of the left arm and leg, then tonic posturing followed by tonic-clonic generalized movements.  One day later the patient had bifrontal rhythmic beta range activity followed by decrement in voltage with fast beta range activity for about 30 seconds and then generalized rhythmic beta range activity. The patient had a slight head bob slowly lowered to the bed with stiffening of both upper extremities and tonic-clonic movement of both upper extremities followed by secondary generalization in his legs. A total of seven seizures were monitored. The last three were obscured by electrode artifact. Seizure onset appeared to occur from both frontal regions. This led to the recommendations to place the vagal nerve stimulator.  .  He has frequent ear infections, urinary tract infections, constipation and a hip fracture. He has had problems with neutropenia and thrombocytopenia. He had some vomiting in June, 2010 and developed aspiration pneumonia. He tends to have a temperature of 99 in the late evening. He has had several episodes in which his left knee has been dislocated. This happened as recently as August 28, 2009. He sat down in a chair and his  knee went out of place. EMS was called and he was taken to the ER. While waiting for the ER physician, Zachary [name] stretched and his knee went back in to place. His mother said that he was subsequently seen by his orthopedist, who thinks that he may eventually need surgery on his knee.   Behavior History none  Surgical History Procedure Laterality Date  . Hip surgery    . Foot surgery    . Implantation vagal nerve stimulator    . Tympanostomy tube placement  2009  . Tonsillectomy  2009  . Pediatomy  2009  . Fracture surgery     Family History family history includes Cancer in his paternal grandmother; Diabetes in his paternal grandfather. Family history is negative for migraines, seizures, intellectual disabilities, blindness, deafness, birth defects, chromosomal disorder, or autism.  Social History . Marital Status: Single    Spouse Name: N/A  . Number of Children: N/A  . Years of Education: N/A   Social History Main Topics  . Smoking status: Never Smoker   . Smokeless tobacco: Never Used  . Alcohol Use: No  . Drug Use: No  . 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 He enjoys swimming and going to the beach.   Allergies Allergen Reactions  . Depakote [Divalproex Sodium] Other (See Comments)  Low Platelets  . Lyrica [Pregabalin] Other (See Comments)    Sleepiness  . Penicillins Hives and Rash  . Topamax [Topiramate] Other (See Comments)    Weight Loss   Physical Exam BP 106/78 mmHg  Pulse 92  General: well developed, obese young man, seated in wheelchair in exam room.  Head: head normocephalic and atraumatic.  Ears, Nose and Throat: oropharynx benign. 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  Neck: supple with no carotid or supraclavicular bruits.  Respiratory: lung clear to auscultation.  Cardiovascular: regular rate and rhythm, no murmurs; he has edema in  both feet  Skin: facial acne. He has well healed surgical scars on his left neck and his left anterior chest from the VNS  Trunk: truncal obesity  Neurologic Exam  Mental Status: Awake and alert. He does not make eye contact. He does not speak. He resists some efforts at examination. He is unable to follow commands. He has very limited language.  Cranial Nerves: Fundoscopic exam reveals poorly visualized disc margins. Pupils equal, briskly reactive to light. Extraocular movements bilateral exotropia without nystagmus; disconjugate gaze with amblyopia on the right. He does not regard objects in the left visual field as well as the right, he has difficulties judging distance when coordinating movements. Hearing appears intact as he turns to localize sounds. Neck flexion and extension normal after VNS was implanted .  Motor: Normal bulk with increased tone in all extremities. decreased strength with formal strength testing in all extremities. wheelchair for transport . Clumsy fine motor movements. 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  Sensory: Intact to withdrawal x4.  Coordination: Poor quality fine motor movements.  Gait and Station: Arises from wheelchair with mild difficulty. Stance is narrow based. Gait demonstrates diplegic gait that is slow with shortened stride length. Varus position of his feet . He walked fairly well today without any assistance.  Reflexes: Diminished and symmetric. Toes downgoing. He had no clonus today.   Assessment 1. 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 2. Complex partial seizures involving generalized tonic-clonic seizures, G40.209. 3. 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 4. Autism spectrum disorder with accompanying intellectual impairment, requiring very substantial support (level 3), F84.0.  Discussion I am pleased with his response to treatment.  I interrogated his  vagal nerve stimulator, which is noted above.  The device continues to work well.  Plan He will return to see me in three months' time sooner depending upon clinical need.  I spent 30 minutes of face-to-face time with [name] and reprogrammed his vagal nerve stimulator.  I also refilled prescriptions for Keppra and both tablets of Vimpat.   Medication List   This list is accurate as of: 07/06/15  4:10 PM.       albuterol (2.5 MG/3ML) 0.083% nebulizer solution  Commonly known as:  PROVENTIL  Take 3 mLs (2.5 mg total) by nebulization every 6 (six) hours as needed for wheezing or shortness of breath.     cetirizine 10 MG tablet  Commonly known as:  ZYRTEC  Take 1 tablet (10 mg total) by mouth daily as needed for allergies.     DERMA-SMOOTHE/FS SCALP 0.01 % Oil  APPLY TWICE DAILY     DIASTAT ACUDIAL 20 MG Gel  Generic drug:  diazepam  Give 15 mg rectally after 2 minutes of seizure.     docusate sodium 100 MG capsule  Commonly known as:  COLACE  Take 200 mg by mouth daily.  fluticasone 50 MCG/ACT nasal spray  Commonly known as:  FLONASE  Place 1 spray into both nostrils daily.     hydrochlorothiazide 12.5 MG capsule  Commonly known as:  MICROZIDE  Take 12.5 mg by mouth 2 (two) times daily.     KEPPRA 1000 MG tablet  Generic drug:  levETIRAcetam  Take 1 and 1/2 tablets twice per day     LORazepam 1 MG tablet  Commonly known as:  ATIVAN  Take 1 tablet by mouth as needed for agitation.     midazolam 5 MG/ML injection  Commonly known as:  VERSED  Place 2 mLs (10 mg total) into the nose once. Draw up 1ml in 2 syringes. Remove blue vial access device. Attach syringe to nasal atomizer for intranasal administration. Give 1ml in each nostril for seizures lasting 2 minutes or longer.     mupirocin ointment 2 %  Commonly known as:  BACTROBAN  Apply 1 application topically 2 (two) times daily. To affected skin.     OXTELLAR XR 600 MG Tb24  Generic drug:  OXcarbazepine ER  Take 3  tablets at bedtime     polyethylene glycol packet  Commonly known as:  MIRALAX / GLYCOLAX  Take 17 g by mouth daily as needed for mild constipation. Can increase to 4 capfuls a day if needed     VIMPAT 100 MG Tabs  Generic drug:  Lacosamide  Take 1 tab by mouth in the morning and 1 tab at 3pm     VIMPAT 200 MG Tabs tablet  Generic drug:  lacosamide  Take 2 tabs by mouth at bedtime      The medication list was reviewed and reconciled. All changes or newly prescribed medications were explained.  A complete medication list was provided to the patient/caregiver.  Deetta Perla MD

## 2015-07-18 ENCOUNTER — Other Ambulatory Visit: Payer: Self-pay | Admitting: Family

## 2015-08-09 ENCOUNTER — Ambulatory Visit (INDEPENDENT_AMBULATORY_CARE_PROVIDER_SITE_OTHER): Payer: Medicaid Other | Admitting: Pediatrics

## 2015-08-09 VITALS — Wt 163.0 lb

## 2015-08-09 DIAGNOSIS — H6693 Otitis media, unspecified, bilateral: Secondary | ICD-10-CM | POA: Diagnosis not present

## 2015-08-09 MED ORDER — SULFAMETHOXAZOLE-TRIMETHOPRIM 800-160 MG PO TABS: 1.0000 | ORAL_TABLET | Freq: Two times a day (BID) | ORAL | Status: AC

## 2015-08-09 MED ORDER — CIPROFLOXACIN-DEXAMETHASONE 0.3-0.1 % OT SUSP: 4.0000 [drp] | Freq: Two times a day (BID) | OTIC | Status: AC

## 2015-08-09 MED ORDER — FLEET ENEMA 7-19 GM/118ML RE ENEM: 1.0000 | ENEMA | Freq: Every day | RECTAL | Status: AC | PRN

## 2015-08-09 NOTE — Patient Instructions (Signed)

## 2015-08-10 ENCOUNTER — Encounter: Payer: Self-pay | Admitting: Pediatrics

## 2015-08-10 DIAGNOSIS — H6690 Otitis media, unspecified, unspecified ear: Secondary | ICD-10-CM | POA: Insufficient documentation

## 2015-08-10 NOTE — Progress Notes (Signed)
Subjective   Zachary AlbertsJohn T [name], 25 y.o. male, with HISTORY OF DEVELOPMENTAL DELAY, NON verbal and wheelchair bound who presents with irritability.  Symptoms started 2 days ago.  He is taking fluids well.  There are no other significant complaints.  The patient's history has been marked as reviewed and updated as appropriate.  Objective   Wt 163 lb (73.936 kg)  General appearance:  well developed and well nourished and well hydrated  Nasal: Neck:  Mild nasal congestion with clear rhinorrhea Neck is supple  Ears:  External ears are normal Right TM - erythematous, dull and bulging Left TM - erythematous, dull and bulging  Oropharynx:  Mucous membranes are moist; there is mild erythema of the posterior pharynx  Lungs:  Lungs are clear to auscultation  Heart:  Regular rate and rhythm; no murmurs or rubs  Skin:  No rashes or lesions noted   Assessment   Acute bilateral otitis media  Plan   1) Antibiotics per orders 2) Fluids, acetaminophen as needed 3) Recheck if symptoms persist for 2 or more days, symptoms worsen, or new symptoms develop.

## 2015-08-19 ENCOUNTER — Telehealth: Payer: Self-pay | Admitting: Pediatrics

## 2015-08-19 DIAGNOSIS — G809 Cerebral palsy, unspecified: Secondary | ICD-10-CM

## 2015-08-19 NOTE — Telephone Encounter (Signed)
Pt order printed

## 2015-08-19 NOTE — Telephone Encounter (Signed)
Jovaughn need a PT order sent to [facility] on [address] They are getting started on the bathroom remodel

## 2015-08-23 ENCOUNTER — Telehealth: Payer: Self-pay | Admitting: Pediatrics

## 2015-08-23 DIAGNOSIS — G808 Other cerebral palsy: Secondary | ICD-10-CM

## 2015-08-23 NOTE — Telephone Encounter (Signed)
Need  Letter for PT eval. For medical necessity for bathroom modification. This is what it needs to say please.

## 2015-08-24 NOTE — Telephone Encounter (Signed)
Letter and script written

## 2015-09-09 ENCOUNTER — Ambulatory Visit: Payer: Medicaid Other | Attending: Pediatrics | Admitting: Physical Therapy

## 2015-09-09 DIAGNOSIS — R2689 Other abnormalities of gait and mobility: Secondary | ICD-10-CM | POA: Diagnosis present

## 2015-09-09 DIAGNOSIS — R293 Abnormal posture: Secondary | ICD-10-CM | POA: Insufficient documentation

## 2015-09-10 DIAGNOSIS — R2689 Other abnormalities of gait and mobility: Secondary | ICD-10-CM | POA: Diagnosis not present

## 2015-09-12 NOTE — Therapy (Signed)
[facility] [address] [place], [place], [address] Phone: [phone]   Fax:  [phone]  Physical Therapy Evaluation  Patient Details  Name: Zachary [name] MRN: [id number] Date of Birth: 07-May-1991 No Data Recorded  Encounter Date: 09/09/2015      PT End of Session - 09/12/15 1516    Visit Number 1   Number of Visits 2  possible w/c assessment   Authorization Type Medicaid   PT Start Time 0846   PT Stop Time 0916   PT Time Calculation (min) 30 min   Activity Tolerance Patient tolerated treatment well   Behavior During Therapy [facility] for tasks assessed/performed      Past Medical History  Diagnosis Date  . Seizures (HCC)   . MR (mental retardation)   . CP (cerebral palsy) (HCC)   . Hypertension   . Fracture of femur, intertrochanteric, closed (HCC)   . Recurrent apnea   . 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 .   . 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 Theressa Stamps. Patient's AHI was 39.6 RDI is 43.6 positional component. Patient will need desensitization and a gentle approach to CPAP titration.     Past Surgical History  Procedure Laterality Date  . Hip surgery    . Foot surgery    . Implantation vagal nerve stimulator    . Tympanostomy tube placement  2009  . Tonsillectomy  2009  . Pediatomy  2009  . Fracture surgery      There were no vitals filed for this visit.       Subjective Assessment - 09/12/15 1516    Subjective Mom and case manager present-want to complete home modification eval (initiated previously) for bathroom/home modifications.   Patient is accompained by: Family member  mom, case manager   Patient Stated Goals as above, per mom   Currently in Pain? No/denies          Letter of Medical Necessity Patient:  Zachary [name] Date  of Birth:  17-Jan-1991 Diagnosis:  Autism, Seizure disorder, CP with congenital quadriplegia Referring Physician:  Ferman Hamming, MD Onset:  Congenital Date of Evaluation:  09/09/2015  To Whom It May Concern: [name] is a 25 year old male who presents to Outpatient PT in a transport wheelchair.  He is accompanied by mother, Zachary [name], and his community guide advocate.  He is dependent for mobility all ADLS due to seizure disorder (complex partial seizures/tonic-clonic seizures), CP with congenital quadriplegia, MR, epilepsy, Autism spectrum disorder with accompanying intellectual impairment requiring very substantial support, and developmental delay.  He is nonverbal and has seizures approximately once every other week.  The seizures can be petit or grand mal in nature, and at times, he has hit his head during a seizure.   His functional mobility fluctuates based on seizures.  While he ambulatory with assistance, he has spent progressively more time in his wheelchair since his intertrochanteric fracture over a year ago.  For gait, he uses mother's HHA, with decreased foot clearance, increased lower extremity internal rotation, and Trendelenburg gait pattern.  Again, due to seizures, his functional mobility fluctuates, with his mother having to provide a total assist lift and carry from bedroom into the bathroom at times. [name] is in need of bathroom/home modifications including a counter, raised toilet seat, grab bars,  widened doorways, roll-in shower with folding shower chair, accessible sink area, tile floor, bedroom floor, and accessible shelving in bathroom, associated electrical changes.  These items are medically necessary in order to increase safety with bathing and grooming and decrease burden on his caregiver.  Currently his mother provides all assistance needed for bathing and ADLs.  Please see the following for justification: . Raised toilet and seat with grab bars-In the current  bathroom set-up, the commode is low requiring additional caregiver assistance for low surface transfer.  The raised toilet seat would allow for improved ease of transfers for decreased caregiver burden and improved positioning. Zachary [name] bars-Current bathroom set-up does not have any grab bars in shower or near toilet.  Grab bars in shower and at commode provide safety and stability for patient and caregiver. . Widening of Doorways-Current bathroom set up does not allow for wheelchair accessibility into bathroom.  Door needs to be widened in order to allow Zachary [name]'s wheelchair to enter bathroom on days where he is non-ambulatory and needs extra assistance due to effects of seizures. . Roll-in shower-Current bathroom set up is a narrow shower, where mother (caregiver) stands patient to assist with bathing.  At times, she has to use a folding chair to assist with bathing due to safety concerns from effect of seizures.  A roll-in shower is needed to accommodate wheelchair or shower chair for ease of showering and improved safety for patient and for caregiver and to decrease risk of falls.  A folding shower seat is required to provide support/seating for ease of showering/safety. . Accessible sink area-Current bathroom set up is a double sink and vanity area which would not allow for easy access.  Currently, mom has to have patient sit on toilet in order to shave him.  A wheelchair accessible sink would allow for improved wheelchair access to sink area for shaving and other ADL activities, decreasing caregiver burden and improving safety of patient and caregiver. . Tile floor-Current bathroom set up includes a linoleum floor, which is slippery when wet.  Often, mother (caregiver) has to make multiple trips across bathroom and wet floor to access where patient is sitting on toilet or folding chair for completing ADLS.  New floor tile is needed so that is safer and less slippery than the current linoleum floor.  The  more slip-resistant tile will reduce fall risk and risk of injury.   Theora Master floor-current flooring can be slippery, causing a fall hazard.  Pt has fallen on bedroom floor.  Carpeted floor would be beneficial to decrease fall risk and injury. . Accessible shelving at wheelchair level for incontinence, medical and personal care supplies. These bathroom/home modifications will greatly increase the ease of bathing and ADLs and decrease burden of care on Zachary [name]'s mother/caregiver.  Risk of injury to Zachary [name] and his mother/caregiver will be minimized and safety with these ADLs will be significantly increased. Please contact me at [phone] with any questions and/or concerns you may have regarding this information.  Thank you for your assistance and cooperation in this matter.  Sincerely,  Zachary [name], Physical Therapist [facility]                           PT Long Term Goals - 09/12/15 1522    PT LONG TERM GOAL #1   Title Pt/mother/vendor will be present for skilled wheelchair assessment for new wheelchair.     Baseline Mom feels pt's current  w/c too small, pt sits in posterior pelvic tilt in current wheelchair, with slumped posture.   Time 12   Period Weeks   Status New               Plan - 09/12/15 1517    Clinical Impression Statement Pt is a 25 year old male known to this therapist from previous visit > 1 year ago.  Mother and CAP case manager still are trying to get home modifications for bathroom and bedroom,a nd this was purpose of visit today was to update LMN needed for home modifications.  In addition, mom reports that he is due for another wheelchair, voicing concerns over current wheelchair fit and position.  She may discuses with physician need to return wtih vendor for scheduled wheelchair eval.   Rehab Potential Fair   PT Frequency --  1 visit over next 12 weeks for wheelchair assessment if needed   PT  Treatment/Interventions Wheelchair mobility training;ADLs/Self Care Home Management   PT Next Visit Plan Wheelchair assessment if needed; mom and CAP case manager to follow up with physician and vendor   Consulted and Agree with Plan of Care Patient;Other (Comment)  CAP case manager      Patient will benefit from skilled therapeutic intervention in order to improve the following deficits and impairments:  Decreased balance, Abnormal gait, Difficulty walking, Decreased safety awareness, Impaired tone  Visit Diagnosis: Other abnormalities of gait and mobility  Abnormal posture     Problem List Patient Active Problem List   Diagnosis Date Noted  . Otitis media in pediatric patient 08/10/2015  . Rhinitis, purulent, chronic 05/24/2015  . Otitis media with effusion 03/27/2015  . Well child check 01/31/2015  . Need for prophylactic vaccination and inoculation against influenza 01/31/2015  . 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 Status post placement of VNS (vagus nerve stimulation) device 02/11/2014  . Medication monitoring encounter 02/11/2014  . 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 Rhinitis, allergic 12/30/2013  . 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 Well adult health check 04/28/2013  . Fluid retention in legs 01/10/2013  . 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 Generalized hyperhidrosis 10/23/2012  . 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 Hypertension 06/25/2011  . Constipation, chronic 12/27/2010  . Tonic-clonic seizures, intractable (HCC) 11/05/2010  . MRSA (methicillin resistant Staphylococcus aureus) carrier 11/05/2010    Gean MaidensMARRIOTT,Maddeline Roorda W. 09/12/2015, 3:24 PM   Gean MaidensMARRIOTT,Makenlee Mckeag W., PT [facility] [address] [place], [place], [address] Phone: [phone]   Fax:  [phone]  Name: Rexene AlbertsJohn T [name] MRN: [id number] Date of Birth: 1991-05-31

## 2015-10-12 ENCOUNTER — Ambulatory Visit (INDEPENDENT_AMBULATORY_CARE_PROVIDER_SITE_OTHER): Payer: Medicaid Other | Admitting: Pediatrics

## 2015-10-12 ENCOUNTER — Telehealth: Payer: Self-pay

## 2015-10-12 VITALS — Wt 163.0 lb

## 2015-10-12 DIAGNOSIS — H66003 Acute suppurative otitis media without spontaneous rupture of ear drum, bilateral: Secondary | ICD-10-CM

## 2015-10-12 DIAGNOSIS — G809 Cerebral palsy, unspecified: Secondary | ICD-10-CM

## 2015-10-12 MED ORDER — AMOXICILLIN-POT CLAVULANATE 500-125 MG PO TABS: 500.0000 mg | ORAL_TABLET | Freq: Two times a day (BID) | ORAL | Status: AC

## 2015-10-12 MED ORDER — CEFTRIAXONE SODIUM 500 MG IJ SOLR
500.0000 mg | Freq: Once | INTRAMUSCULAR | Status: AC
  Administered 2015-10-12: 500 mg via INTRAMUSCULAR

## 2015-10-12 MED ORDER — KETOCONAZOLE 2 % EX CREA: 1.0000 "application " | TOPICAL_CREAM | Freq: Every day | CUTANEOUS | Status: DC

## 2015-10-12 NOTE — Progress Notes (Signed)
Patient received rocephin IM in left deltoid 500 mg. No reaction noted. Lot #: [id number] Expire: 04/04/2018 NDC: [id number]

## 2015-10-12 NOTE — Telephone Encounter (Signed)
Patient's mother called stating that the patient had seizures yesterday and today as well as a few last week. She states that he is on antibiotics but she does not know if that is what is causing his seizures. She states that they are at [facility] for an appointment to see what is going on. She is not requesting a call back.  [phone]

## 2015-10-13 ENCOUNTER — Encounter: Payer: Self-pay | Admitting: Pediatrics

## 2015-10-13 DIAGNOSIS — H6691 Otitis media, unspecified, right ear: Secondary | ICD-10-CM | POA: Insufficient documentation

## 2015-10-13 DIAGNOSIS — G809 Cerebral palsy, unspecified: Secondary | ICD-10-CM | POA: Insufficient documentation

## 2015-10-13 NOTE — Patient Instructions (Signed)
Earache An earache, also called otalgia, can be caused by many things. Pain from an earache can be sharp, dull, or burning. The pain may be temporary or constant. Earaches can be caused by problems with the ear, such as infection in either the middle ear or the ear canal, injury, impacted ear wax, middle ear pressure, or a foreign body in the ear. Ear pain can also result from problems in other areas. This is called referred pain. For example, pain can come from a sore throat, a tooth infection, or problems with the jaw or the joint between the jaw and the skull (temporomandibular joint, or TMJ). The cause of an earache is not always easy to identify. Watchful waiting may be appropriate for some earaches until a clear cause of the pain can be found. HOME CARE INSTRUCTIONS Watch your condition for any changes. The following actions may help to lessen any discomfort that you are feeling:  Take medicines only as directed by your health care provider. This includes ear drops.  Apply ice to your outer ear to help reduce pain.  Put ice in a plastic bag.  Place a towel between your skin and the bag.  Leave the ice on for 20 minutes, 2-3 times per day.  Do not put anything in your ear other than medicine that is prescribed by your health care provider.  Try resting in an upright position instead of lying down. This may help to reduce pressure in the middle ear and relieve pain.  Chew gum if it helps to relieve your ear pain.  Control any allergies that you have.  Keep all follow-up visits as directed by your health care provider. This is important. SEEK MEDICAL CARE IF:  Your pain does not improve within 2 days.  You have a fever.  You have new or worsening symptoms. SEEK IMMEDIATE MEDICAL CARE IF:  You have a severe headache.  You have a stiff neck.  You have difficulty swallowing.  You have redness or swelling behind your ear.  You have drainage from your ear.  You have hearing  loss.  You feel dizzy.   This information is not intended to replace advice given to you by your health care provider. Make sure you discuss any questions you have with your health care provider.   Document Released: 01/06/2004 Document Revised: 06/11/2014 Document Reviewed: 12/20/2013 Elsevier Interactive Patient Education 2016 [employer]  

## 2015-10-13 NOTE — Progress Notes (Signed)
Subjective   Rexene AlbertsJohn T Cerrito, 25 y.o. male, with GLOBAL developmental delay, paraplegic, mental retardation, cerebral palsy who presents with bilateral ear pain and this is shown by him hitting his ears with his palms constantly. He has had TM tubes--mom had been using topical antibiotic ear drops which has not been helping .  Symptoms started 2 days ago.  He is not taking fluids well.  There are no other significant complaints.  The patient's history has been marked as reviewed and updated as appropriate.  Objective   Wt 163 lb (73.936 kg)  General appearance:  well hydrated and fretful  Nasal: Neck:  Mild nasal congestion with clear rhinorrhea Neck is supple  Ears:  External ears are normal Right TM - tympanostomy tube patent and in proper position and purulent middle ear fluid Left TM - tympanostomy tube patent and in proper position with serous fluid in canal  Oropharynx:  Mucous membranes are moist; there is mild erythema of the posterior pharynx  Lungs:  Lungs are clear to auscultation  Heart:  Regular rate and rhythm; no murmurs or rubs  Skin:  No rashes or lesions noted   Assessment   Acute bilateral otitis media  Plan   1) Antibiotics per orders--rocephin and augmentin  2) Fluids, acetaminophen as needed 3) Recheck if symptoms persist for 2 or more days, symptoms worsen, or new symptoms develop.

## 2015-10-13 NOTE — Telephone Encounter (Signed)
Noted, he often has increased seizures during times when he is sick.  Contact will be made with her seizures continue.

## 2015-10-17 DIAGNOSIS — H9201 Otalgia, right ear: Secondary | ICD-10-CM | POA: Insufficient documentation

## 2015-11-25 ENCOUNTER — Encounter: Payer: Self-pay | Admitting: Family

## 2015-11-25 ENCOUNTER — Ambulatory Visit (INDEPENDENT_AMBULATORY_CARE_PROVIDER_SITE_OTHER): Payer: Medicaid Other | Admitting: Family

## 2015-11-25 VITALS — Wt 162.0 lb

## 2015-11-25 DIAGNOSIS — G808 Other cerebral palsy: Secondary | ICD-10-CM

## 2015-11-25 DIAGNOSIS — J0100 Acute maxillary sinusitis, unspecified: Secondary | ICD-10-CM | POA: Diagnosis not present

## 2015-11-25 DIAGNOSIS — I10 Essential (primary) hypertension: Secondary | ICD-10-CM | POA: Diagnosis not present

## 2015-11-25 DIAGNOSIS — G809 Cerebral palsy, unspecified: Secondary | ICD-10-CM | POA: Diagnosis not present

## 2015-11-25 MED ORDER — AMOXICILLIN-POT CLAVULANATE 500-125 MG PO TABS: 1.0000 | ORAL_TABLET | Freq: Two times a day (BID) | ORAL | Status: AC

## 2015-11-25 NOTE — Patient Instructions (Signed)

## 2015-11-25 NOTE — Progress Notes (Signed)
Subjective:     Rexene AlbertsJohn T Darco is a 25 y.o. male who presents for evaluation of sinus pain. Symptoms include: clear rhinorrhea, congestion, frequent clearing of the throat, itchy nose, mouth breathing, nasal congestion, periorbital venous congestion, puffiness of the eyes and sinus pressure. Onset of symptoms was 3 weeks ago. Symptoms have been gradually worsening since that time. Patient is a non-smoker. Mother is also concerned because she has noticed that he has been dealing with swelling of his ankles, he was placed on Amlodipine by Dr. Ane PaymentHooker about a year ago and she fears that may be the cause. He has never been seen by nephrology   The following portions of the patient's history were reviewed and updated as appropriate: allergies, current medications, past family history, past medical history, past social history, past surgical history and problem list.  Review of Systems Constitutional: negative Eyes: negative Ears, nose, mouth, throat, and face: positive for nasal congestion and sinus pressure Respiratory: positive for cough Cardiovascular: negative Gastrointestinal: negative Musculoskeletal:negative Neurological: negative   Objective:    General appearance: cooperative Head: Normocephalic, without obvious abnormality, atraumatic Eyes: conjunctivae/corneas clear. PERRL, EOM's intact. Fundi benign. Ears: normal TM's and external ear canals both ears Nose: yellow discharge, moderate congestion Throat: lips, mucosa, and tongue normal; teeth and gums normal Neck: no adenopathy, no carotid bruit, no JVD, supple, symmetrical, trachea midline and thyroid not enlarged, symmetric, no tenderness/mass/nodules Lungs: clear to auscultation bilaterally and normal percussion bilaterally Heart: regular rate and rhythm, S1, S2 normal, no murmur, click, rub or gallop Extremities: Mild edema to bilateral ankles.  Pulses: 2+ and symmetric Skin: Skin color, texture, turgor normal. No rashes or  lesions    Assessment:    Acute bacterial sinusitis.    Plan:  Augmentin BID x 10 days  Tylenol or Motrin for pain/fever  Refer to Nephrology for blood pressure management   Nasal steroids per medication orders.

## 2015-11-25 NOTE — Addendum Note (Signed)
Addended by: Saul FordyceLOWE, CRYSTAL M on: 11/25/2015 12:07 PM   Modules accepted: Orders

## 2015-12-08 ENCOUNTER — Ambulatory Visit: Payer: Medicaid Other | Attending: Pediatrics | Admitting: Physical Therapy

## 2015-12-08 DIAGNOSIS — R293 Abnormal posture: Secondary | ICD-10-CM | POA: Diagnosis present

## 2015-12-08 DIAGNOSIS — M6281 Muscle weakness (generalized): Secondary | ICD-10-CM

## 2015-12-08 DIAGNOSIS — R29818 Other symptoms and signs involving the nervous system: Secondary | ICD-10-CM

## 2015-12-09 NOTE — Therapy (Signed)
[facility] [address] [place], [place], [address] Phone: [phone]   Fax:  [phone]  Physical Therapy Treatment  Patient Details  Name: Zachary [name] MRN: [id number] Date of Birth: 29-Jan-1991 No Data Recorded  Encounter Date: 12/08/2015      PT End of Session - 12/09/15 1232    PT Start Time 1016   PT Stop Time 1110   PT Time Calculation (min) 54 min      Past Medical History  Diagnosis Date  . Seizures ([place])   . MR (mental retardation)   . CP (cerebral palsy) ([place])   . Hypertension   . Fracture of femur, intertrochanteric, closed ([place])   . Recurrent apnea   . 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 .   . 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 Hyman Bower. Patient's AHI was 39.6 RDI is 43.6 positional component. Patient will need desensitization and a gentle approach to CPAP titration.     Past Surgical History  Procedure Laterality Date  . Hip surgery    . Foot surgery    . Implantation vagal nerve stimulator    . Tympanostomy tube placement  2009  . Tonsillectomy  2009  . Pediatomy  2009  . Fracture surgery      There were no vitals filed for this visit.       Mobility/Seating Evaluation    PATIENT INFORMATION: Name: Zachary [name] DOB: 12/22/1990  Sex: Male Date seen: 12/08/15 Time: 1015  Address:  [place] [address] Physician: Marcha Solders This evaluation/justification form will serve as the LMN for the following suppliers: __________________________ Supplier: [facility] Contact Person: Zachary [name], Wess Botts Phone:  [phone]   Seating Therapist: Mady [name], PT Phone:   [phone]   Phone: [phone] (Home) [phone] (Mobile) *Preferred*    Spouse/Parent/Caregiver name: Zachary [name], mom  Phone  number: [phone] Insurance/Payer: Medicaid     Reason for Referral: new manual wheelchair  Patient/Caregiver Goals: Would like to have new manual wheelchair that is wider with seat belt, and elevating leg rests  Patient was seen for face-to-face evaluation for new manual wheelchair.  Also present was Zachary [name], mom, and Zachary [name], grandmother, and Zachary [name], ATP to discuss recommendations and wheelchair options.  Further paperwork was completed and sent to vendor.  Patient appears to qualify for manual mobility device at this time per objective findings.   MEDICAL HISTORY: Diagnosis: Primary Diagnosis: Congenital quadriplegia     ICD-10-CM: G80.8     Cerebral palsy, unspecified   ICD-10-CM: G80.9    Onset: congenital-birth Diagnosis: Seizures, Mental retardation, HTN,Fracture of femur, intertrochanteric, closed (HCC)(4-5 years ago, LLE)    Recurrent apnea      _0 Progressive Disease Relevant past and future surgeries: NA   Height: 5'3 1/2" Weight: 160 lbs Explain recent changes or trends in weight: NA   History including Falls: L hip fracture 4-5 years ago due to fall; 1 fall approximately 6 months ago due to a seizure    HOME ENVIRONMENT: _1 House  _2 Condo/town home  _3 Apartment  _4 Assisted Living    _5 Lives Alone _6  Lives with Others  Hours with caregiver: 24 hour supervision; CAP 20 hrs/week  _0 Home is accessible to patient           Stairs      _1 Yes _2  No     Ramp _3 Yes _4 No Comments:  Level entry to home; home currently undergoing bathroom modifications   COMMUNITY ADL: TRANSPORTATION: _5 Car    _6 Van    <WCBJSEGBTDVVOHYW>_7<\/PXTGGYIRSWNIOEVO>_3 Public Transportation    _8 Adapted w/c Lift    _9 Ambulance    _10 Other:       _11 Sits in wheelchair during transport  Employment/School: After [employer] during the day year-round Specific requirements pertaining to mobility ?????  Other: Pt rides in SUV with mother at times;  other times, SCAT    FUNCTIONAL/SENSORY PROCESSING SKILLS:  Handedness:   _12 Right     _13 Left    _14 NA  Comments:  Uses either hand-caregiver propels chair-pt does not propel chair at all  Functional Processing Skills for Wheeled Mobility _15 Processing Skills are adequate for safe wheelchair operation  Areas of concern than may interfere with safe operation of wheelchair Description of problem   _16  Attention to environment      _17 Judgment      _18  Hearing  _19  Vision or visual processing      _20 Motor Planning  _21  Fluctuations in Behavior  ?????    VERBAL COMMUNICATION: _22 WFL receptive _23  WFL expressive _24 Understandable  _25 Difficult to understand  _26 non-communicative _27  Uses an augmented communication device  CURRENT SEATING / MOBILITY: Current Mobility Base:  _28 None _29 Dependent _30 Manual _31 Scooter _32 Power  Type of Control: ?????  Manufacturer:  BreezySize:  18" x 18"Age: 23+ years  Current Condition of Mobility Base:  Does not have functioning seat belt, brakes lock insufficiently; wheels wobbly during propulsion-well-used chair   Current Wheelchair components:  manual wheelchair with elevating leg rests  Describe posture in present seating system:  Pt holds feet below chair with feet in plantar flexed position, holds arms in flexed posture while sitting      SENSATION and SKIN ISSUES: Sensation _33 Intact  _34 Impaired _35 Absent  Level of sensation: ????? Pressure Relief: Able to perform effective pressure relief :    _36 Yes  _37  No Method: change of positions or chairs with caregiver supervision If not, Why?: ?????  Skin Issues/Skin Integrity Current Skin Issues  _38 Yes _39 No _40 Intact _41  Red area_42  Open Area  _43 Scar Tissue _44 At risk from prolonged sitting Where  R inner thigh, groin area  History of Skin Issues  _45 Yes _46 No Where  posterior thigh behind knees When  periodically  Hx of skin flap surgeries  _47 Yes _48 No Where  ????? When  ?????  Limited sitting tolerance _49 Yes _50 No  Hours spent sitting in wheelchair daily: Sits in wheelchair during school ([employer]) 2hours at a time; 5-6 hours total during the day.  Complaint of Pain:  Please describe: Pt non-verbal; does not appear in pain during eval.   Swelling/Edema: bilateral lower extremity swelling; feet swelling-pt unable to wear shoes   ADL STATUS (in reference to wheelchair use):  Indep Assist Unable Indep with Equip Not assessed Comments  Dressing ????? X ????? ????? ????? Needs assistance for all ADLs  Eating ????? X ????? ????? ????? ?????  Toileting ????? X ????? ????? ????? ?????  Bathing ????? X ????? ????? ????? ?????  Grooming/Hygiene ????? X ????? ????? ????? ?????  Meal Prep ????? ????? X ????? ????? ?????  IADLS ????? ????? ????? ????? X ?????  Bowel Management: _51 Continent  _52 Incontinent  _53 Accidents Comments:  Wears diapers  Bladder Management: _54 Continent  [  x]Incontinent  _0 Accidents Comments:  Needs enema or Miralax to assist     WHEELCHAIR SKILLS: Manual w/c Propulsion: _1 UE or LE strength and endurance sufficient to participate in ADLs using manual wheelchair Arm : _2 left _3 right   _4 Both      Distance: ????? Foot:  _5 left _6 right   _7 Both  Operate Scooter: _8  Strength, hand grip, balance and transfer appropriate for use _9 Living environment is accessible for use of scooter  Operate Power w/c:  _10  Std. Joystick   _11  Alternative Controls Indep _12  Assist _13  Dependent/unable _14  N/A _15   _16 Safe          _17  Functional      Distance: ?????  Bed confined without wheelchair _18  Yes _19  No   STRENGTH/RANGE OF MOTION:  Passive Range of Motion Strength  Shoulder Flexion WFL; Abduction to 90 degrees bilaterally Difficult to fully assess strength throughout due to increased tone.  Pt able to periodically move UEs through partial ROM  Elbow holds elbows flexed, able to passively move to full elbow extension ?????  Wrist/Hand Holds wrists and finders flexed; able to passively move to [facility]  ?????  Hip WFL ?????  Knee WFL into full knee extension ?????  Ankle passive ankle dorsiflexion at least to neutral ?????     MOBILITY/BALANCE:  _20  Patient is totally dependent for mobility  ?????    Balance Transfers Ambulation  Sitting Balance: Standing Balance: _21  Independent _22  Independent/Modified Independent  _23  WFL     _24  WFL _25  Supervision _26  Supervision  _27  Uses UE for balance  _28  Supervision _29  Min Assist _30  Ambulates with Assist  assist of mom    _31  Min Assist _32  Min assist _33  Mod Assist _34  Ambulates with Device:      _35  RW  _36  StW  _37  Cane  _38  ?????  _39  Mod Assist _40  Mod assist _41  Max assist   _42  Max Assist _43  Max assist _44  Dependent _45  Indep. Short Distance Only  _46  Unable _47  Unable _48  Lift / Sling Required Distance (in feet)  ?????   _49  Sliding board _50  Unable to Ambulate (see explanation below)  Cardio Status:  _51 Intact  _52  Impaired   _53  NA     ?????  Respiratory Status:  _54 Intact   _55 Impaired   _56 NA     ?????  Orthotics/Prosthetics: ?????  Comments (Address manual vs power w/c vs scooter): Pt's functional mobility fluctuates based on seizures.  While he is ambulatory with assistance, he has spent progressively more time in his wheelchair since his intertrochanteric fracture several years ago.  For gait, he uses mother's HHA, with decreased foot clearance, increased lower extremity internal rotation, and Trendelenburg gait pattern.  Again, due to seizures, his functional mobility fluctuates, with his mother having to provide a total assist lift and carry from bedroom into the bathroom at times.  Pt is dependent for wheelchair propulsion.  He currently does not and is not able to self-propel his manual wheelchair.  His mother or caregivers propel wheelchair.         Anterior / Posterior Obliquity Rotation-Pelvis Slight Lateral trunk flexion to L  PELVIS    _57  _58  _59   Neutral Posterior Anterior  _60  _61  _62   WFL Rt elev Lt elev  _63  _64  _65   WFL Right Left                       Anterior    Anterior     _66  Fixed _67  Other [  x] Partly Flexible _0  Flexible   _1  Fixed _2  Other _3  Partly Flexible  _4  Flexible  _5  Fixed _6  Other _7  Partly Flexible  _8  Flexible   TRUNK  _9  _10  _11   WFL ? Thoracic ? Lumbar  Kyphosis Lordosis  _12  _13  _14   WFL Convex Convex  Right Left _15 c-curve _16 s-curve _17 multiple  _18  Neutral _19  Left-anterior _20  Right-anterior     _21  Fixed _22  Flexible _23  Partly Flexible _24  Other  _25  Fixed _26  Flexible _27  Partly Flexible _28  Other  _29  Fixed             _30  Flexible _31  Partly Flexible _32  Other    Position Windswept  ?????  HIPS          _33            _34               _35    Neutral       Abduct        ADduct         _36           _37            _38   Neutral Right           Left      _39  Fixed _40  Subluxed _41  Partly Flexible _42  Dislocated _43  Flexible  _44  Fixed _45  Other _46  Partly Flexible  _47  Flexible                 Foot Positioning Knee Positioning  In current w/c, holds feet in plantar flexion and supination.  Able to place feet flat on floor during assessment.    _48  WFL  _49 Lt _50 Rt _51  WFL  _52 Lt _53 Rt    KNEES ROM concerns: ROM concerns:    & Dorsi-Flexed _54 Lt _55 Rt ?????    FEET Plantar Flexed _56 Lt _57 Rt      Inversion                 _58 Lt _59 Rt      Eversion                 _60 Lt _61 Rt     HEAD _62  Functional _63  Good Head Control  ?????  & _64  Flexed         _65  Extended _66  Adequate Head Control    NECK _67  Rotated  Lt  _68  Lat Flexed Lt _69  Rotated  Rt _70  Lat Flexed Rt _71  Limited Head Control     _72  Cervical Hyperextension _73  Absent  Head Control     SHOULDERS ELBOWS WRIST& HAND Holds elbows and wrists flexed while sitting in w/c; able to passivelya nd sometimes actively move out of those positions.        Left     Right    Left     Right    Left     Right   U/E _74 Functional           _75 Functional ????? ????? _76 Fisting             _77 Fisting      _78 elev   _79 dep      _80 elev   _81 dep       _82 pro -_83 retract     _84 pro  _85 retract _86 subluxed              _87 subluxed           Goals for Wheelchair Mobility  _88  Independence with mobility in the home with motor related  ADLs (MRADLs)  _0  Independence with MRADLs in the community _1  Provide dependent mobility  _2  Provide recline     _3 Provide tilt   Goals for Seating system _4  Optimize pressure distribution _5  Provide support needed to facilitate function or safety _6  Provide corrective forces to assist with maintaining or improving posture _7  Accommodate client's posture:   current seated postures and positions are not flexible or will not tolerate corrective forces _8  Client to be independent with relieving pressure in the wheelchair _9 Enhance physiological function such as breathing, swallowing, digestion  Simulation ideas/Equipment trials:????? State why other equipment was unsuccessful:?????   MOBILITY BASE RECOMMENDATIONS and JUSTIFICATION: MOBILITY COMPONENT JUSTIFICATION  Manufacturer: Ki MobilityModel: Catalyst 5   Size: Width 19"Seat Depth 18" _10 provide transport from point A to B      _11 promote Indep mobility  _12 is not a safe, functional ambulator _13 walker or cane inadequate _14 non-standard width/depth necessary to accommodate anatomical measurement _15  ?????  _16 Manual Mobility Base _17 non-functional ambulator    _18 Scooter/POV  _19 can safely operate  _20 can safely transfer   _21 has adequate trunk stability  _22 cannot functionally propel manual w/c  _23 Power Mobility Base  _24 non-ambulatory  _25 cannot functionally propel manual wheelchair  _26  cannot functionally and safely operate scooter/POV _27 can safely operate and willing to  _28 Stroller Base _29 infant/child  _30 unable to propel manual wheelchair _31 allows for growth _32 non-functional ambulator _33 non-functional UE _34 Indep mobility is not a goal at this time  _35 Tilt  _36 Forward _37 Backward _38 Powered tilt  _39 Manual tilt  _40 change position against gravitational force on head and shoulders  _41 change position for  pressure relief/cannot weight shift _42 transfers  _43 management of tone _44 rest periods _45 control edema _46 facilitate postural control  _47  ?????  _48 Recline  _49 Power recline on power base _50 Manual recline on manual base  _51 accommodate femur to back angle  _52 bring to full recline for ADL care  _53 change position for pressure relief/cannot weight shift _54 rest periods _55 repositioning for transfers or clothing/diaper /catheter changes _56 head positioning  _57 Lighter weight required _58 self- propulsion  _59 lifting _60  ?????  _61 Heavy Duty required _62 user weight greater than 250# _63 extreme tone/ over active movement _64 broken frame on previous chair _65  ?????  _66  Back  _67  Angle Adjustable _68  Custom molded Tension adjustable _69 postural control _70 control of tone/spasticity _71 accommodation of range of motion _72 UE functional control _73 accommodation for seating system _74  ????? _75 provide lateral trunk support _76 accommodate deformity _77 provide posterior trunk support _78 provide lumbar/sacral support _79 support trunk in midline _80 Pressure relief over spinal processes  _81  Seat [place] Skin protection with incontinence liner _82 impaired sensation  _83 decubitus ulcers present _84 history of pressure ulceration _85 prevent pelvic extension _86 low maintenance  _87 stabilize pelvis  _88 accommodate obliquity _89 accommodate multiple deformity _90 neutralize lower extremity position _91 increase pressure distribution _92  ?????  _93  Pelvic/thigh support  _94  Lateral thigh guide _95  Distal medial pad  _96  Distal lateral pad _97  pelvis in neutral _98 accommodate pelvis _99  position upper legs _100  alignment _101  accommodate ROM _102  decr adduction _103 accommodate tone _104 removable for transfers _105 decr abduction  _106  Lateral trunk Supports _107  Lt     _108  Rt _109 decrease lateral trunk leaning _110 control tone _111 contour for increased contact _112 safety  _113 accommodate asymmetry _114  ?????  _115  Mounting hardware   _116 lateral trunk supports  _117 back   _118 seat _119 headrest      _120  thigh support _121 fixed   _122 swing away _123 attach seat platform/cushion to w/c frame _124 attach back cushion to w/c frame _125 mount postural supports _126 mount headrest  _127 swing medial thigh support away _128 swing lateral supports away for transfers  _129  ?????    Armrests  _130 fixed _131 adjustable  height _0 removable   _1 swing away  _2 flip back   _3 reclining _4 full length pads _5 desk    _6 pads tubular  _7 provide support with elbow at 90   _8 provide support for w/c tray _9 change of height/angles for variable activities _10 remove for transfers _11 allow to come closer to table top _12 remove for access to tables _13  ?????  Hangers/ Leg rests  _14 60 _15 70 _16 90 _17 elevating _18 heavy duty  _19 articulating _20 fixed _21 lift off _22 swing away     _23 power _24 provide LE support  _25 accommodate to hamstring tightness _26 elevate legs during recline   _27 provide change in position for Legs _28 Maintain placement of feet on footplate _29 durability _30 enable transfers _31 decrease edema _32 Accommodate lower leg length _33  ?????  Foot support Footplate    <ZOXWRUEAVWUJWJXB>_1<\/YNWGNFAOZHYQMVHQ>_46 Lt  _35  Rt  _36  Center mount _37 flip up     _38 depth/angle adjustable _39 Amputee adapter    _40  Lt     _41  Rt _42 provide foot support _43 accommodate to ankle ROM _44 transfers _45 Provide support for residual extremity _46  allow foot to go under wheelchair base _47  decrease tone  _48  ?????  _49  Ankle strap/heel loops _50 support foot on foot support _51 decrease extraneous movement _52 provide input to heel  _53 protect foot  Tires: _54 pneumatic  _55 flat free inserts  _56 solid  _57 decrease maintenance  _58 prevent frequent flats _59 increase shock absorbency _60 decrease pain from road shock _61 decrease spasms from road shock _62  ?????  _63  Headrest  _64 provide posterior head support _65 provide posterior neck support _66 provide lateral head support _67 provide anterior head support _68 support during tilt and  recline _69 improve feeding   _70 improve respiration _71 placement of switches _72 safety  _73 accommodate ROM  _74 accommodate tone _75 improve visual orientation  _76  Anterior chest strap _77  Vest _78  Shoulder retractors  _79 decrease forward movement of shoulder _80 accommodation of TLSO _81 decrease forward movement of trunk _82 decrease shoulder elevation _83 added abdominal support _84 alignment _85 assistance with shoulder control  _86  ?????  Pelvic Positioner _87 Belt _88 SubASIS bar _89 Dual Pull _90 stabilize tone _91 decrease falling out of chair/ **will not Decr potential for sliding due to pelvic tilting _92 prevent excessive rotation _93 pad for protection over boney prominence _94 prominence comfort _95 special pull angle to control rotation _96  ?????  Upper Extremity Support _97 L   _98  R _99 Arm trough    _100 hand support _101  tray       _102 full tray _103 swivel mount _104 decrease edema      _105 decrease subluxation   _106 control tone   _107 placement for AAC/Computer/EADL _108 decrease gravitational pull on shoulders _109 provide midline positioning _110 provide support to increase UE function _111 provide hand support in natural position _112 provide work surface   POWER WHEELCHAIR CONTROLS  _113 Proportional  _114 Non-Proportional Type ????? _115 Left  _116 Right _117 provides access for controlling wheelchair   _118 lacks motor control to operate proportional drive control <NGEXBMWUXLKGMWNU>_2<\/VOZDGUYQIHKVQQVZ>_563 unable to understand proportional controls  Actuator Control Module  _120 Single  _121 Multiple   _122 Allow the client to operate the power seat function(s) through the joystick control   _123 Safety Reset Switches _124 Used to change modes and stop the wheelchair when driving in latch mode    _125 Upgraded Electronics   _126 programming for accurate control _127 progressive Disease/changing condition _128 non-proportional drive control needed _129 Needed in order to operate power seat functions through joystick control   _130 Display box _131 Allows user to see in which mode and drive the wheelchair is set   _132 necessary for alternate controls    _133 Digital interface electronics _134 Allows w/c to operate when using alternative drive controls  <OVFIEPPIRJJOACZY>_6<\/AYTKZSWFUXNATFTD>_322 ASL Head Array _136 Allows client to operate wheelchair  through switches placed in tri-panel headrest  _137 Sip and puff with tubing kit _138 needed to operate sip and puff drive  controls  _0 Upgraded tracking electronics _1 increase safety when driving <VZDGLOVFIEPPIRJJ>_8<\/ACZYSAYTKZSWFUXN>_2 correct tracking when on uneven surfaces  _3 Mount for switches or joystick _4 Attaches switches to w/c  _5 Swing away for access or transfers _6 midline for optimal placement _7 provides for consistent access  _8 Attendant controlled joystick plus mount _9 safety _10 long distance driving <TFTDDUKGURKYHCWC>_3<\/JSEGBTDVVOHYWVPX>_10 operation of seat functions _12 compliance with transportation regulations _13  ?????    Rear wheel placement/Axle adjustability _14 None _15 semi adjustable _16 fully adjustable  _17 improved UE access to wheels _18 improved stability _19 changing angle in space for improvement of postural stability _20 1-arm drive access <GYIRSWNIOEVOJJKK>_9<\/FGHWEXHBZJIRCVEL>_38 amputee pad placement _22  ?????  Wheel rims/ hand rims  _23 metal  _24 plastic coated _25 oblique projections _26 vertical projections _27 Provide ability to propel manual wheelchair  _28  Increase self-propulsion with hand weakness/decreased grasp  Push handles _29 extended  _30 angle adjustable  _31 standard _32 caregiver access _33 caregiver assist _34 allows "hooking" to enable increased ability to perform ADLs or maintain balance  One armed device  _35 Lt   _36 Rt _37 enable propulsion of manual wheelchair with one arm   _38  ?????   Brake/wheel lock extension _39  Lt   _40  Rt _41 increase indep in applying wheel locks   _42 Side guards _43 prevent clothing getting caught in wheel or becoming soiled _44  prevent skin tears/abrasions  Battery: ????? _45 to power wheelchair ?????  Other: ????? ????? ?????  The above equipment has a life- long use expectancy. Growth and changes in medical and/or functional conditions would be the exceptions. This is to certify  that the therapist has no financial relationship with durable medical provider or manufacturer. The therapist will not receive remuneration of any kind for the equipment recommended in this evaluation.   Patient has mobility limitation that significantly impairs safe, timely participation in one or more mobility related ADL's.  (bathing, toileting, feeding, dressing, grooming, moving from room to room)                                                             _46  Yes _47  No Will mobility device sufficiently improve ability to participate and/or be aided in participation of MRADL's?         _48  Yes _49  No Can limitation be compensated for with use of a cane or walker?                                                                                _50  Yes _51  No Does patient or caregiver demonstrate ability/potential ability & willingness to safely use the mobility device?   _52  Yes _53  No Does patient's home environment support use of recommended mobility device?                                                    _54  Yes _55  No Does patient have sufficient upper extremity function necessary to functionally propel a manual wheelchair?    _56  Yes _57  No Does patient have sufficient  strength and trunk stability to safely operate a POV (scooter)?                                  _0  Yes _1  No Does patient need additional features/benefits provided by a power wheelchair for MRADL's in the home?       _2  Yes _3  No Does the patient demonstrate the ability to safely use a power wheelchair?                                                              _4  Yes _5  No  Therapist Name Printed: ????? Date: ?????  Therapist's Signature:   Date:   Supplier's Name Printed: ????? Date: ?????  Supplier's Signature:   Date:  Patient/Caregiver Signature:   Date:     This is to certify that I have read this evaluation and do agree with the content within:    Physician's Name Printed: ?????  Physician's Signature:   Date:     This is to certify that I, the above signed therapist have the following affiliations: _6  This DME provider _7  Manufacturer of recommended equipment _8  Patient's long term care facility _9  None of the above                                PT Long Term Goals - 09/12/15 1522    PT LONG TERM GOAL #1   Title Pt/mother/vendor will be present for skilled wheelchair assessment for new wheelchair.     Baseline Mom feels pt's current w/c too small, pt sits in posterior pelvic tilt in current wheelchair, with slumped posture.   Time 12   Period Weeks   Status New             Patient will benefit from skilled therapeutic intervention in order to improve the following deficits and impairments:     Visit Diagnosis: Abnormal posture  Muscle weakness (generalized)  Other symptoms and signs involving the nervous system     Problem List Patient Active Problem List   Diagnosis Date Noted  . Acute purulent otitis media 10/13/2015  . Cerebral palsy, unspecified ([place]) 10/13/2015  . Otitis media in pediatric patient 08/10/2015  . Rhinitis, purulent, chronic 05/24/2015  . Otitis media with effusion 03/27/2015  . Well child check 01/31/2015  . Need for prophylactic vaccination and inoculation against influenza 01/31/2015  . 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 Status post placement of VNS (vagus nerve stimulation) device 02/11/2014  . Medication monitoring encounter 02/11/2014  . 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 Rhinitis, allergic 12/30/2013  . 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 Congenital quadriplegia ([place]) 12/30/2013  . 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 Well adult health check 04/28/2013  . Fluid retention in legs 01/10/2013  . 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 Generalized hyperhidrosis 10/23/2012  . 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 Hypertension 06/25/2011  . Constipation, chronic 12/27/2010  . Tonic-clonic seizures, intractable ([place]) 11/05/2010  . MRSA (methicillin resistant Staphylococcus aureus) carrier  11/05/2010    Elver Stadler W. 12/09/2015, 12:33 PM Frazier Butt., PT    [facility] [address] [place], [place], [address] Phone: [phone]   Fax:  [phone]  Name: SHAYNE DIGUGLIELMO MRN: [id number] Date of Birth: Oct 22, 1990

## 2016-01-11 ENCOUNTER — Other Ambulatory Visit: Payer: Self-pay | Admitting: Nephrology

## 2016-01-11 DIAGNOSIS — R601 Generalized edema: Secondary | ICD-10-CM

## 2016-01-11 DIAGNOSIS — N289 Disorder of kidney and ureter, unspecified: Secondary | ICD-10-CM

## 2016-01-12 ENCOUNTER — Telehealth: Payer: Self-pay

## 2016-01-12 DIAGNOSIS — G40209 Localization-related (focal) (partial) symptomatic epilepsy and epileptic syndromes with complex partial seizures, not intractable, without status epilepticus: Secondary | ICD-10-CM

## 2016-01-12 DIAGNOSIS — G40319 Generalized idiopathic epilepsy and epileptic syndromes, intractable, without status epilepticus: Secondary | ICD-10-CM

## 2016-01-12 MED ORDER — VIMPAT 100 MG PO TABS: ORAL_TABLET | ORAL | 5 refills | Status: DC

## 2016-01-12 MED ORDER — VIMPAT 200 MG PO TABS: ORAL_TABLET | ORAL | 5 refills | Status: DC

## 2016-01-12 NOTE — Telephone Encounter (Signed)
Patient's pharmacy sent over a refill request for Vimpat  and .   CB:[phone]

## 2016-01-12 NOTE — Telephone Encounter (Signed)
Rx faxed as requested. TG 

## 2016-01-18 ENCOUNTER — Encounter: Payer: Medicaid Other | Admitting: Pediatrics

## 2016-01-19 ENCOUNTER — Other Ambulatory Visit: Payer: Medicaid Other

## 2016-01-19 ENCOUNTER — Other Ambulatory Visit: Payer: Self-pay | Admitting: Family

## 2016-01-20 NOTE — Telephone Encounter (Signed)
Faxed to pharmacy

## 2016-01-24 ENCOUNTER — Encounter: Payer: Medicaid Other | Admitting: Pediatrics

## 2016-01-27 ENCOUNTER — Ambulatory Visit
Admission: RE | Admit: 2016-01-27 | Discharge: 2016-01-27 | Disposition: A | Discharge status: Home / Self Care | Payer: Medicaid Other | Source: Ambulatory Visit | Attending: Nephrology | Admitting: Nephrology

## 2016-01-27 DIAGNOSIS — N289 Disorder of kidney and ureter, unspecified: Secondary | ICD-10-CM

## 2016-01-27 DIAGNOSIS — R601 Generalized edema: Secondary | ICD-10-CM

## 2016-01-30 ENCOUNTER — Encounter: Payer: Self-pay | Admitting: Pediatrics

## 2016-01-30 ENCOUNTER — Ambulatory Visit (INDEPENDENT_AMBULATORY_CARE_PROVIDER_SITE_OTHER): Payer: Medicaid Other | Admitting: Pediatrics

## 2016-01-30 VITALS — Wt 164.0 lb

## 2016-01-30 DIAGNOSIS — H66001 Acute suppurative otitis media without spontaneous rupture of ear drum, right ear: Secondary | ICD-10-CM

## 2016-01-30 MED ORDER — AMOXICILLIN-POT CLAVULANATE 500-125 MG PO TABS: 1.0000 | ORAL_TABLET | Freq: Two times a day (BID) | ORAL | 0 refills | Status: AC

## 2016-01-30 NOTE — Progress Notes (Signed)
Subjective:    Zachary [name] is a 25 y.o. old male here with his mother for Otalgia .    HPI: Zachary [name] has a history of CP and global delay and seizure d/o.  Presents with history of 2 seizures yesterday and 2 the previous weeks and mom reports has had some papules on his back and neck skin over the weekend and she has been putting abx ointment on.  He has had a history of many ear infections.  No change in seizure meds but has changed some blood pressure medications which mom reports does sometimes cause increase in breakthrough seizures.  He has been hitting his ears lately and mom thinks there might be an ear infection.  He has had these symptoms before and needed to be treated.  He has had a dry cough intermittently for about 1 week.  He take zyrtec and has been taking it when the cough started and it has helped.  Mom reports that he is not having difficulty breathing but does have moments where he takes deep breaths and sighs.  Not noticed any fluid drainage from ears.  Has an appointment for Neurology this week to discuss medication.   -Denies fevers, runny nose, congestion, ear pain, eye drainage, difficulty breathing, wheezing, dysuria, decreased fluid intake/output, swollen joints, lethargy   Review of Systems Pertinent items are noted in HPI.   Allergies: Allergies  Allergen Reactions  . Depakote [Divalproex Sodium] Other (See Comments)    Low Platelets  . Lyrica [Pregabalin] Other (See Comments)    Sleepiness  . Topamax [Topiramate] Other (See Comments)    Weight Loss     Current Outpatient Prescriptions on File Prior to Visit  Medication Sig Dispense Refill  . albuterol (PROVENTIL) (2.5 MG/3ML) 0.083% nebulizer solution Take 3 mLs (2.5 mg total) by nebulization every 6 (six) hours as needed for wheezing or shortness of breath. 75 mL 12  . amLODipine (NORVASC) 5 MG tablet Take 5 mg by mouth 2 (two) times daily.    . cetirizine (ZYRTEC) 10 MG tablet Take 1 tablet (10 mg total) by mouth  daily as needed for allergies. 30 tablet 12  . DIASTAT ACUDIAL 20 MG GEL Give 15 mg rectally after 2 minutes of seizure. 2 Package 5  . docusate sodium (COLACE) 100 MG capsule Take 200 mg by mouth daily.    . Fluocinolone Acetonide (DERMA-SMOOTHE/FS SCALP) 0.01 % OIL APPLY TWICE DAILY 120 mL 3  . fluticasone (FLONASE) 50 MCG/ACT nasal spray Place 1 spray into both nostrils daily. 16 g 1  . hydrochlorothiazide (MICROZIDE) 12.5 MG capsule Take 12.5 mg by mouth 2 (two) times daily.     Marland Kitchen. KEPPRA 1000 MG tablet Take 1 and 1/2 tablets twice per day 93 tablet 5  . ketoconazole (NIZORAL) 2 % cream Apply 1 application topically daily. 30 g 4  . LORazepam (ATIVAN) 1 MG tablet Take 1 tablet by mouth as needed for agitation. 30 tablet 0  . midazolam (VERSED) 5 MG/ML injection Place 2 mLs (10 mg total) into the nose once. Draw up 1ml in 2 syringes. Remove blue vial access device. Attach syringe to nasal atomizer for intranasal administration. Give 1ml in each nostril for seizures lasting 2 minutes or longer. 6 mL 0  . mupirocin ointment (BACTROBAN) 2 % Apply 1 application topically 2 (two) times daily. To affected skin. 22 g 2  . OXTELLAR XR 600 MG TB24 TAKE 3 TABLETS AT BEDTIME 93 tablet 1  . polyethylene glycol (MIRALAX / GLYCOLAX)  packet Take 17 g by mouth daily as needed for mild constipation. Can increase to 4 capfuls a day if needed    . VIMPAT 100 MG TABS Take 1 tab by mouth in the morning and 1 tab at 3pm 62 tablet 5  . VIMPAT 200 MG TABS tablet Take 2 tabs by mouth at bedtime 62 tablet 5   No current facility-administered medications on file prior to visit.     History and Problem List: Past Medical History:  Diagnosis Date  . 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 Theressa Stamps. Patient's AHI was 39.6 RDI is 43.6 positional component. Patient will need desensitization and a gentle approach to CPAP titration.   . CP (cerebral palsy) ([facility])   . Fracture of  femur, intertrochanteric, closed ([facility])   . Hypertension   . MR (mental retardation)   . Recurrent apnea   . Seizures ([facility])   . 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 .     Patient Active Problem List   Diagnosis Date Noted  . Acute purulent otitis media 10/13/2015  . Cerebral palsy, unspecified ([facility]) 10/13/2015  . Otitis media in pediatric patient 08/10/2015  . Rhinitis, purulent, chronic 05/24/2015  . Otitis media with effusion 03/27/2015  . Well child check 01/31/2015  . Need for prophylactic vaccination and inoculation against influenza 01/31/2015  . 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 Status post placement of VNS (vagus nerve stimulation) device 02/11/2014  . Medication monitoring encounter 02/11/2014  . 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 Rhinitis, allergic 12/30/2013  . 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 Well adult health check 04/28/2013  . Fluid retention in legs 01/10/2013  . 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 Generalized hyperhidrosis 10/23/2012  . 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 Hypertension 06/25/2011  . Constipation, chronic 12/27/2010  . Tonic-clonic seizures, intractable ([facility]) 11/05/2010  . MRSA (methicillin resistant Staphylococcus aureus) carrier 11/05/2010        Objective:    Wt 164 lb (74.4 kg)   BMI 32.03 kg/m   General: alert, active, cooperative, wheelchair bound, delayed Head: Normocephalic, atraumatic ENT: oropharynx moist, no lesions, no caries present, nares without discharge Eye:  PERRL, EOMI, conjunctivae clear, no  discharge Ears: right ear with mild amount fluid seen in canal difficult to visualize opening of tube, left tube patent TM clear not bulging Neck: supple, no sig LAD Lungs: clear to auscultation, no wheeze or crackles Heart: RRR, Nl S1, S2, no murmurs Abd: soft, non tender, non distended, normal BS, no organomegaly, no masses appreciated Skin: no rash Neuro: No focal deficits  No results found for this or any previous visit (from the past 2160 hour(s)).     Assessment:   Juanmanuel is a 25 y.o. old male with  1. Acute suppurative otitis media of right ear without spontaneous rupture of tympanic membrane, recurrence not specified     Plan:   1.  Start Augmentin 500mg  bid x10 days for AOM.  Analgesics as needed for pain/fever.  Return of no improvement or concerns in 2-3 days.   2.  Discussed to return for worsening symptoms or further concerns.   -  f/u with neurology to discuss medication adjustment if needed.   Patient's Medications  New Prescriptions   AMOXICILLIN-CLAVULANATE (AUGMENTIN) 500-125 MG TABLET    Take 1 tablet (500 mg total) by mouth 2 (two) times daily.  Previous Medications   ALBUTEROL (PROVENTIL) (2.5 MG/3ML) 0.083% NEBULIZER SOLUTION    Take 3 mLs (2.5 mg total) by nebulization every 6 (six) hours as needed for wheezing or shortness of breath.   AMLODIPINE (NORVASC) 5 MG TABLET    Take 5 mg by mouth 2 (two) times daily.   CETIRIZINE (ZYRTEC) 10 MG TABLET    Take 1 tablet (10 mg total) by mouth daily as needed for allergies.   DIASTAT ACUDIAL 20 MG GEL    Give 15 mg rectally after 2 minutes of seizure.   DOCUSATE SODIUM (COLACE) 100 MG CAPSULE    Take 200 mg by mouth daily.   FLUOCINOLONE ACETONIDE (DERMA-SMOOTHE/FS SCALP) 0.01 % OIL    APPLY TWICE DAILY   FLUTICASONE (FLONASE) 50 MCG/ACT NASAL SPRAY    Place 1 spray into both nostrils daily.   HYDROCHLOROTHIAZIDE (MICROZIDE) 12.5 MG CAPSULE    Take 12.5 mg by mouth 2 (two) times daily.    KEPPRA 1000 MG TABLET     Take 1 and 1/2 tablets twice per day   KETOCONAZOLE (NIZORAL) 2 % CREAM    Apply 1 application topically daily.   LORAZEPAM (ATIVAN) 1 MG TABLET    Take 1 tablet by mouth as needed for agitation.   MIDAZOLAM (VERSED) 5 MG/ML INJECTION    Place 2 mLs (10 mg total) into the nose once. Draw up 1ml in 2 syringes. Remove blue vial access device. Attach syringe to nasal atomizer for intranasal administration. Give 1ml in each nostril for seizures lasting 2 minutes or longer.   MUPIROCIN OINTMENT (BACTROBAN) 2 %    Apply 1 application topically 2 (two) times daily. To affected skin.   OXTELLAR XR 600 MG TB24    TAKE 3 TABLETS AT BEDTIME   POLYETHYLENE GLYCOL (MIRALAX / GLYCOLAX) PACKET    Take 17 g by mouth daily as needed for mild constipation. Can increase to 4 capfuls a day if needed   VIMPAT 100 MG TABS    Take 1 tab by mouth in the morning and 1 tab at 3pm   VIMPAT 200 MG TABS TABLET    Take 2 tabs by mouth at bedtime  Modified Medications   No medications on file  Discontinued Medications   No medications on file     Return if symptoms worsen or fail to improve. in 2-3 days  Myles Gip, DO

## 2016-01-30 NOTE — Patient Instructions (Signed)

## 2016-02-01 ENCOUNTER — Encounter: Payer: Self-pay | Admitting: Pediatrics

## 2016-02-01 ENCOUNTER — Ambulatory Visit (INDEPENDENT_AMBULATORY_CARE_PROVIDER_SITE_OTHER): Payer: Medicaid Other | Admitting: Pediatrics

## 2016-02-01 VITALS — BP 120/98 | HR 76 | Wt 164.8 lb

## 2016-02-01 DIAGNOSIS — F840 Autistic disorder: Secondary | ICD-10-CM

## 2016-02-01 DIAGNOSIS — G808 Other cerebral palsy: Secondary | ICD-10-CM | POA: Diagnosis not present

## 2016-02-01 DIAGNOSIS — I10 Essential (primary) hypertension: Secondary | ICD-10-CM | POA: Diagnosis not present

## 2016-02-01 DIAGNOSIS — F71 Moderate intellectual disabilities: Secondary | ICD-10-CM | POA: Diagnosis not present

## 2016-02-01 DIAGNOSIS — G40209 Localization-related (focal) (partial) symptomatic epilepsy and epileptic syndromes with complex partial seizures, not intractable, without status epilepticus: Secondary | ICD-10-CM | POA: Diagnosis not present

## 2016-02-01 NOTE — Progress Notes (Signed)
Patient: Zachary [name] MRN: [id number] Sex: male DOB: 07/02/1990  Provider: Deetta Perla, MD Location of Care: [facility]  Note type: Routine return visit  History of Present Illness: Referral Source: [name], MD History from: mother and [facility] chart Chief Complaint: VNS/Epilepsy  Zachary [name] is a 25 y.o. male who returns on February 01, 2016, for the first time since July 06, 2015.  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  This is considered intractable, but he has responded well to three antiepileptic drugs plus vagal nerve stimulator.  He went six weeks without having any seizures.  Recently, he had a change in his antihypertensive medicines.  Amlodipine and hydrochlorothiazide were discontinued.  Nifedipine extended release was started.  This happened because he had significant swelling in his feet and ankles, which is diminished.  The change took place on January 11, 2016.    On January 17, 2016, he had a three to four-minute generalized tonic-clonic seizure at 4:17 p.m.  On January 24, 2016, he had a complex partial seizure of two minutes in duration at 6:22 a.m.  On January 28, 2016, he had a 30-second to one minute complex partial seizure at 6:30 a.m. and four minute generalized tonic-clonic seizure at 4 p.m.  This represents a marked increase in seizure frequency.  Zachary [name] had clusters of seizures like this in the past.  I am not certain that it has anything to do with the changes in his antihypertensive medicines.  His blood pressure today was higher than it had been.  He had a bowel movement before coming and had to be changed.  Overall, Zachary [name] is doing well.  He has a good appetite.  He sleeps well.  His level of activity is unchanged.  His mother told me that he knows how to open and close the garage door and also the car door.  He also loves to turn on and off lights.  She is very  pleased with his seizure control after implantation of his vagal nerve stimulator.  He returns today for interrogation of his vagal nerve stimulator.  The procedure was described below.  Procedure: interrogation of vagal nerve stimulator  The implanted device is series 102, serial [id number], implanted May 23, 2011.  On interrogation: amplitude 2.50 mA, frequency 20 Hz, pulse width 250 s, stimulation duration 7 seconds, stimulation interval 0.8 minutes, magnet amplitude 2.75 mA, magnet stimulation duration 60 seconds, magnet pulse width 250 s.  Number of stimuli since last visit is 37; total number since implantation: 643.  System diagnostics with an amplitude of 1.0 milliamps again showed intact communication, output, and impedance. DC/ DC conversion code was 1. There is no indication to change the battery.  Review of Systems: 12 system review was remarkable for change in medication, increased seizures; the remainder was assessed and was negative  Past Medical History Diagnosis Date  . 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 Theressa Stamps. Patient's AHI was 39.6 RDI is 43.6 positional component. Patient will need desensitization and a gentle approach to CPAP titration.   . CP (cerebral palsy) (HCC)   . Fracture of femur, intertrochanteric, closed (HCC)   . Hypertension   . MR (mental retardation)   . Recurrent apnea   . Seizures (HCC)   . 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 Marland Kitchen  Hospitalizations: No., Head Injury: No., Nervous System Infections: No., Immunizations up to date: Yes.    I began to see him on July 10, 2004. He apparently was seen by me when he was eight months of age with developmental delay. Seizure activity; however, did not began until he is 14, two days after starting amitriptyline. 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 His most recent CT scan of the brain was on September 29, 2009, and showed no significant changes.  EEG showed diffuse background slowing.   He was treated with Depakote, Topamax, and Lyrica, but suffered side effects and agitation from those medications. He has taken Trileptal, Keppra, and Vimpat for quite some time. [facility] for 6 days.for a phase 1 evaluation. MRI of the brain showed ex-vacuo dilatation of his ventricles and cortical atrophy. Volume loss most pronounced in the parietal lobes bilaterally, temporal lobes with widening of the sylvian fissures and a paucity of white matter diffusely most pronounced again in the parietal lobes with patchy T2 hyperintensity. 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 There was no evidence of mesial temporal sclerosis.  Magnetoencephalogram showed epileptiform activity arising from the left posterior temple region with a field extending to the occipital lobe. This was not seen with concurrent routine EEG.  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 This was somewhat worse in the right than the left bilateral temporal cortex and temporal pole suggesting a longstanding insult.  Prolonged video EEG showed onset of bifrontal slowing that evolved into rhythmic activity with spikes of greater amplitude over the right central region with secondary generalization. Clinically, the patient has onset of a myoclonic jerk with his head turned to the left, movement of the left arm and leg, then tonic posturing followed by tonic-clonic generalized movements.  One day later the patient had  bifrontal rhythmic beta range activity followed by decrement in voltage with fast beta range activity for about 30 seconds and then generalized rhythmic beta range activity. The patient had a slight head bob slowly lowered to the bed with stiffening of both upper extremities and tonic-clonic movement of both upper extremities followed by secondary generalization in his legs. A total of seven seizures were monitored. The last three were obscured by electrode artifact. Seizure onset appeared to occur from both frontal regions. This led to the recommendations to place the vagal nerve stimulator.  .  He has frequent ear infections, urinary tract infections, constipation and a hip fracture. He has had problems with neutropenia and thrombocytopenia. He had some vomiting in June, 2010 and developed aspiration pneumonia. He tends to have a temperature of 99 in the late evening. He has had several episodes in which his left knee has been dislocated. This happened as recently as August 28, 2009. He sat down in a chair and his knee went out of place. EMS was called and he was taken to the ER. While waiting for the ER physician, Zachary [name] stretched and his knee went back in to place. His mother said that he was subsequently seen by his orthopedist, who thinks that he may eventually need surgery on his knee.   Behavior History none  Surgical History  Procedure Laterality Date  . FOOT SURGERY    . FRACTURE SURGERY    . HIP SURGERY    . IMPLANTATION VAGAL NERVE STIMULATOR    . Pediatomy  2009  . TONSILLECTOMY  2009  . TYMPANOSTOMY TUBE PLACEMENT  2009   Family History family history includes Cancer in his paternal grandmother; Diabetes in his paternal grandfather. Family history is negative for migraines, seizures, intellectual disabilities, blindness, deafness, birth defects, chromosomal disorder, or autism.  Social History . Marital status: Single    Spouse name: N/A  . Number of children: N/A  . Years of  education: N/A   Social History Main Topics  . Smoking status: Never Smoker  . Smokeless tobacco: Never Used  . Alcohol use No  . Drug use: No  . 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     He enjoys swimming and going to the beach.   Allergies Allergen Reactions  . Depakote [Divalproex Sodium] Other (See Comments)    Low Platelets  . Lyrica [Pregabalin] Other (See Comments)    Sleepiness  . Topamax [Topiramate] Other (See Comments)    Weight Loss   Physical Exam BP (!) 120/98   Pulse 76   Wt 164 lb 12.8 oz (74.8 kg)   BMI 32.19 kg/m   General: alert, well developed, obese, in no acute distress, black hair, brown eyes, left handed Head: normocephalic, no dysmorphic features Ears, Nose and Throat: Otoscopic: tympanic membranes normal; pharynx: oropharynx is pink without exudates or tonsillar hypertrophy Neck: supple, full range of motion, no cranial or cervical bruits Respiratory: auscultation clear Cardiovascular: no murmurs, pulses are normal Musculoskeletal: no skeletal deformities or apparent scoliosis Skin: no rashes or neurocutaneous lesions  Neurologic Exam  Mental Status: alert; oriented to person, place and year; knowledge is normal for age; language is normal Cranial Nerves: visual fields are full to double simultaneous stimuli; extraocular movements are full and conjugate; pupils are round reactive to light; funduscopic examination shows sharp disc margins with normal vessels; symmetric facial strength; midline tongue and uvula; air conduction is greater than bone conduction bilaterally Motor: Normal strength, tone and mass; good fine motor movements; no pronator drift Sensory: intact responses to cold, vibration, proprioception and stereognosis Coordination: good finger-to-nose, rapid repetitive alternating movements and finger apposition Gait and Station: normal gait and station:  patient is able to walk on heels, toes and tandem without difficulty; balance is adequate; Romberg exam is negative; Gower response is negative Reflexes: symmetric and diminished bilaterally; no clonus; bilateral flexor plantar responses  Assessment 1. 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 2. 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 3. Essential hypertension, I10. 4. 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 5. 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  Discussion Zachary [name] is physically and neurologically stable.  I am concerned about the increased frequency of his seizures, but do not know what is causing it.  At present, the only medication that yields a meaningful antiepileptic drug level is Oxtellar.  I did not make changes in his vagal nerve stimulator.  Plan We will observe without making changes in his medications or his vagal nerve stimulator.  He will return in three months' time for routine visit.  He has now had over four and a half years of use of his vagal nerve stimulator battery.  There is no way for me to  know how far the battery has been drawn down.  I want to make certain that seizure frequency does not increase because the device fails when the battery is depleted.  I interrogated and reprogrammed his vagal nerve stimulator.  He did not require prescription refills.  I spent 15 minutes of face-to-face time beyond the interrogation of vagal nerve stimulator, taking a history and examining him.   Medication List   Accurate as of 02/01/16 11:59 PM.      albuterol (2.5 MG/3ML) 0.083% nebulizer solution Commonly known as:  PROVENTIL Take 3 mLs (2.5 mg total) by nebulization every 6 (six) hours as needed for wheezing or shortness of breath.   amoxicillin-clavulanate 500-125 MG tablet Commonly known as:  AUGMENTIN Take 1 tablet (500 mg total) by mouth 2 (two) times  daily.   cetirizine 10 MG tablet Commonly known as:  ZYRTEC Take 1 tablet (10 mg total) by mouth daily as needed for allergies.   DERMA-SMOOTHE/FS SCALP 0.01 % Oil APPLY TWICE DAILY   DIASTAT ACUDIAL 20 MG Gel Generic drug:  diazepam Give 15 mg rectally after 2 minutes of seizure.   docusate sodium 100 MG capsule Commonly known as:  COLACE Take 200 mg by mouth daily.   fluticasone 50 MCG/ACT nasal spray Commonly known as:  FLONASE Place 1 spray into both nostrils daily.   KEPPRA 1000 MG tablet Generic drug:  levETIRAcetam Take 1 and 1/2 tablets twice per day   ketoconazole 2 % cream Commonly known as:  NIZORAL Apply 1 application topically daily.   LORazepam 1 MG tablet Commonly known as:  ATIVAN Take 1 tablet by mouth as needed for agitation.   midazolam 5 MG/ML injection Commonly known as:  VERSED Place 2 mLs (10 mg total) into the nose once. Draw up 1ml in 2 syringes. Remove blue vial access device. Attach syringe to nasal atomizer for intranasal administration. Give 1ml in each nostril for seizures lasting 2 minutes or longer.   mupirocin ointment 2 % Commonly known as:  BACTROBAN Apply 1 application topically 2 (two) times daily. To affected skin.   NIFEdipine 60 MG 24 hr tablet Commonly known as:  PROCARDIA XL/ADALAT-CC Take 60 mg by mouth daily.   OXTELLAR XR 600 MG Tb24 Generic drug:  OXcarbazepine ER TAKE 3 TABLETS AT BEDTIME   polyethylene glycol packet Commonly known as:  MIRALAX / GLYCOLAX Take 17 g by mouth daily as needed for mild constipation. Can increase to 4 capfuls a day if needed   VIMPAT 100 MG Tabs Generic drug:  Lacosamide Take 1 tab by mouth in the morning and 1 tab at 3pm   VIMPAT 200 MG Tabs tablet Generic drug:  lacosamide Take 2 tabs by mouth at bedtime     The medication list was reviewed and reconciled. All changes or newly prescribed medications were explained.  A complete medication list was provided to the  patient/caregiver.  Deetta Perla MD

## 2016-02-08 ENCOUNTER — Encounter: Payer: Medicaid Other | Admitting: Pediatrics

## 2016-03-12 ENCOUNTER — Encounter: Payer: Self-pay | Admitting: Pediatrics

## 2016-03-12 ENCOUNTER — Ambulatory Visit: Payer: Medicaid Other

## 2016-03-12 ENCOUNTER — Ambulatory Visit (INDEPENDENT_AMBULATORY_CARE_PROVIDER_SITE_OTHER): Payer: Medicaid Other | Admitting: Pediatrics

## 2016-03-12 VITALS — BP 112/72 | Ht 60.5 in | Wt 182.3 lb

## 2016-03-12 DIAGNOSIS — G800 Spastic quadriplegic cerebral palsy: Secondary | ICD-10-CM | POA: Diagnosis not present

## 2016-03-12 DIAGNOSIS — G40319 Generalized idiopathic epilepsy and epileptic syndromes, intractable, without status epilepticus: Secondary | ICD-10-CM

## 2016-03-12 DIAGNOSIS — G808 Other cerebral palsy: Secondary | ICD-10-CM

## 2016-03-12 DIAGNOSIS — B356 Tinea cruris: Secondary | ICD-10-CM | POA: Insufficient documentation

## 2016-03-12 DIAGNOSIS — Z23 Encounter for immunization: Secondary | ICD-10-CM | POA: Diagnosis not present

## 2016-03-12 DIAGNOSIS — H6593 Unspecified nonsuppurative otitis media, bilateral: Secondary | ICD-10-CM

## 2016-03-12 MED ORDER — POLYETHYLENE GLYCOL 3350 17 G PO PACK: 17.0000 g | PACK | Freq: Every day | ORAL | 12 refills | Status: DC | PRN

## 2016-03-12 MED ORDER — SULFAMETHOXAZOLE-TRIMETHOPRIM 400-80 MG PO TABS: 1.0000 | ORAL_TABLET | Freq: Two times a day (BID) | ORAL | 0 refills | Status: AC

## 2016-03-12 MED ORDER — KETOCONAZOLE 2 % EX CREA: 1.0000 "application " | TOPICAL_CREAM | Freq: Every day | CUTANEOUS | 4 refills | Status: DC

## 2016-03-12 MED ORDER — CETIRIZINE HCL 10 MG PO TABS: 10.0000 mg | ORAL_TABLET | Freq: Every day | ORAL | 12 refills | Status: DC | PRN

## 2016-03-12 NOTE — Progress Notes (Signed)
Reason for visit----Today's visit is for Wheelchair Mobility Assessment  Subjective:    Zachary [name] is a 25 y.o. male who had concerns including Wheelchair Mobility Assessment and Annual Exam.    He has a GMFCS level of V. Gross Motor function Classification Level---V means patient is wheelchair bound and needs to be transported via wheelchair for all mobility needs.  History of Present Illness Main concerns today are : need for new wheelchair and banging ears with possible ear infection.   Developmental History Head/Trunk control: delayed (severe) Speech: delayed (severe delay) Swallowing issues: appropriate for age Hand function: delayed (severe) Mobility: delayed (wheelchair bound)  Function: Mobility: manual wheelchair Pain concerns: no Hand function:  Right: grope, grasp, release  Left: grope, grasp, release Spine curvature: mild Swallowing: modified diet (multiple diet restictions) Toileting: dependent Community activities: minimal  Equipment: AFOs, bath chair, gait trainer, hand/wrist splint(s) and wheelchair  Review of Systems: Vision: normal Hearing: impaired Seizures: yes - controlled on medications Constipation: yes - on miralax GE reflux: no Fractures: no Musculoskeletal---Quadriplegia with contractures CNS--Seizures with CP Ears--banging his ears which represents possible pain.  The following portions of the patient's history were reviewed and updated as appropriate: allergies, current medications, past family history, past medical history, past social history, past surgical history and problem list.  Keppra--100 mg BID Oxtellar 600 mg daily Vimpat 100/200 am and pm Nifedipine 60 mg at 8:30 am Midzazolam--5 mg PRN Neomycin PRN  Dr Hickling--seizures No ortho No GI ENT--[facility]--DR Signe ColtUpton --[facility]--for BP Dental--Dr [facility]  [facility]--Wheelchair/Diapers/Ensure  Therapy--No OT and no PT  Family/Social  History Living arrangements: with mother     Objective:    Physical Exam BP 112/72   Ht 5' 0.5" (1.537 m)   Wt 182 lb 4.8 oz (82.7 kg)   BMI 35.02 kg/m  Cognition: non-interactive Respiratory: normal, no increased effort Upper extremity function:  Right: minimal purposeful movements  Left: minimal purposeful movements  HEENT--Both TM red and bulging Chest--clear, no wheezing and good air entry bilaterally CVS--No murmurs with normal rate and rhythm  Abdomen: soft, no masses Spine scoliosis: mild  Sitting Ability: assisted Gait: wheelchair bound Skin--normal except for groin with scaly itchy rash between legs    Musculoskeletal--spastic quadriplegia with contractures and unable to ambulate  Assessment:     1. Spastic quadriplegia with CP 2. Seizures 3. Bilateral otitis media 4. Tinea cruris     Plan:    1. Quadriplegia--- ----a. Reviewed and agree with Physical Therapy Evaluation ----b. Patient needs ULTRA Insurance risk surveyorLIGHTWEIGHT Manual Wheelchair for PROPER POSITIONING, EASIER PROPELLING, and ADEQUATE TRANSPORT.  2. Otitis media--Restart Bactrim BID X 10 days  3. Tinea Cruris --nizoral cream BID X 2 weeks  4. Refill meds--zyrtec/miralax.

## 2016-03-12 NOTE — Patient Instructions (Signed)
Cerebral Palsy Cerebral palsy is a disorder that affects a person's ability to control movement and posture. This condition results from damage to the areas of the brain that control the use of muscles. It appears in the first few years of life. The condition may affect the whole body or just certain parts. Cerebral palsy cannot be cured. However, the condition will not progress or get worse, and symptoms can be improved with treatment. The effects of cerebral palsy can range from mild to severe. Many people with cerebral palsy can live near-normal lives if their problems are properly managed. In other cases, the condition can cause severe disabilities. There are three main types of cerebral palsy:  Spastic. This is the most common type. The muscles are in a constant state of tightness. This leads to stiff or jerky movements. Arms, legs, and other areas of the body may be affected to varying degrees.  Athetoid or dyskinetic. The muscle tone varies, sometimes being too tight and sometimes too loose. This leads to difficulty controlling and coordinating movement. Involuntary movements and constant motion are common.  Ataxic. This type leads to problems with balance and coordination. The person may have a staggering walk, unsteady hands, and abnormal eye movements. Some people may have a combination of these types. CAUSES Cerebral palsy is caused by injury to--or abnormal development of--the parts of the brain that control the use of muscles. In most cases, the damage to the brain occurs before birth (congenital). It can also occur during or after birth. Some common causes of this damage include:  Head injury.  Meningitis or encephalitis.  Infections that were passed from the mother.  Genetic disorders.  Stroke before birth.  Lack of oxygen to the brain during birth. This is rare.  Severe jaundice after birth. RISK FACTORS Some things that can increase a child's risk for cerebral palsy  include:  Premature birth.  Low birth weight.  Infections in the mother during pregnancy. SIGNS AND SYMPTOMS Signs and symptoms vary from person to person and can change over time. They can range from mild to severe. Early signs of cerebral palsy usually appear before 3 years of age. Children with cerebral palsy are often slow to reach developmental milestones, such as:  Rolling over.  Sitting.  Crawling.  Walking.  Talking. Later symptoms may include:  Stiff or jerky movements of the legs, arms, and back.  Abnormal movements that can be stiff or floppy.  Slow movements.  Trouble with muscle coordination and balance.  Difficulty walking.  Difficulty sitting straight.  Difficulty with fine motor tasks, such as writing or using scissors.  Exaggerated or reduced reflexes.  Poor control of the mouth, which an lead to drooling, problems with chewing and swallowing, or problems with speech. Some people with cerebral palsy are also affected by other medical problems, such as:  Seizures.  Mental impairment.  Problems with vision, hearing, or speech.  Dental problems and oral diseases. DIAGNOSIS To make a diagnosis, your child's health care provider may:  Do a physical exam and take your child's medical history.  Test your child's muscle tone, motor skills, and reflexes.  Order imaging tests of your child's brain, such as an ultrasound, CT scan, or MRI.  Order blood tests to check for other conditions. TREATMENT There is no cure for cerebral palsy. Treatment will be aimed at helping to control your child's symptoms and helping to improve his or her ability to function. Your child may be referred to an early intervention   program to help coordinate treatment. Treatment may include:  Medicines to control seizures and muscle spasms.  Braces or splints to help with muscle imbalance.  Surgery to treat muscle stiffness or to relax muscle tendons that are too  tight.  Mechanical aids to help perform tasks that are difficult because of impairments.  Physical, occupational, and speech therapy.  Counseling for emotional and psychological needs. HOME CARE INSTRUCTIONS  Give medicines only as directed by your child's health care provider.   Work closely with your child's team of health care providers. This team may include:  A physical therapist to help your child build stronger muscles and improve balance and coordination.  A speech and language therapist to help your child with speech, language, and feeding.  An occupational therapist to help your child with skills that are needed for tasks such as eating, dressing, and writing.  A counselor or a behavioral therapist to help your child with emotional or behavioral issues.  A neurologist or a developmental pediatrician to help coordinate care, treat medical conditions, and manage medicines.  Keep all follow-up visits as directed by your child's health care provider. This is important. SEEK MEDICAL CARE IF:  Your child has a fever.  Your child has chills.  Your child's symptoms are getting worse.  Your child develops new symptoms.  Your child has a cough that will not go away.  Your child is having trouble getting enough nutrition.  Your child has shortness of breath.  Your child is anxious or depressed. SEEK IMMEDIATE MEDICAL CARE IF:  Your child chokes, coughs, or has trouble breathing after eating or drinking.  Your child gets hurt in a fall.  Your child has new seizures or a significant change in his or her usual seizure pattern.   This information is not intended to replace advice given to you by your health care provider. Make sure you discuss any questions you have with your health care provider.   Document Released: 02/10/2002 Document Revised: 06/11/2014 Document Reviewed: 12/30/2013 Elsevier Interactive Patient Education 2016 Elsevier Inc.  

## 2016-03-13 NOTE — Addendum Note (Signed)
Addended by: Saul FordyceLOWE, CRYSTAL M on: 03/13/2016 08:29 AM   Modules accepted: Orders

## 2016-03-19 ENCOUNTER — Other Ambulatory Visit (INDEPENDENT_AMBULATORY_CARE_PROVIDER_SITE_OTHER): Payer: Self-pay | Admitting: Family

## 2016-03-19 DIAGNOSIS — G40209 Localization-related (focal) (partial) symptomatic epilepsy and epileptic syndromes with complex partial seizures, not intractable, without status epilepticus: Secondary | ICD-10-CM

## 2016-03-19 DIAGNOSIS — G40319 Generalized idiopathic epilepsy and epileptic syndromes, intractable, without status epilepticus: Secondary | ICD-10-CM

## 2016-03-19 MED ORDER — OXTELLAR XR 600 MG PO TB24: 3.0000 | ORAL_TABLET | Freq: Every day | ORAL | 1 refills | Status: DC

## 2016-04-13 ENCOUNTER — Other Ambulatory Visit (INDEPENDENT_AMBULATORY_CARE_PROVIDER_SITE_OTHER): Payer: Self-pay | Admitting: Family

## 2016-04-13 DIAGNOSIS — G40209 Localization-related (focal) (partial) symptomatic epilepsy and epileptic syndromes with complex partial seizures, not intractable, without status epilepticus: Secondary | ICD-10-CM

## 2016-04-13 DIAGNOSIS — G40319 Generalized idiopathic epilepsy and epileptic syndromes, intractable, without status epilepticus: Secondary | ICD-10-CM

## 2016-04-13 MED ORDER — KEPPRA 1000 MG PO TABS: ORAL_TABLET | ORAL | 5 refills | Status: DC

## 2016-05-24 ENCOUNTER — Other Ambulatory Visit: Payer: Self-pay | Admitting: Family

## 2016-05-24 ENCOUNTER — Telehealth (INDEPENDENT_AMBULATORY_CARE_PROVIDER_SITE_OTHER): Payer: Self-pay | Admitting: Pediatrics

## 2016-05-24 NOTE — Telephone Encounter (Signed)
-----   Message from [name], NP sent at 05/24/2016  9:25 AM EST ----- Regarding: Needs appointment Zachary [name] needs an appointment with Dr Sharene SkeansHickling for VNS maintenance Thanks,  [name]

## 2016-05-24 NOTE — Telephone Encounter (Signed)
LVM to CB to schedule VNS appt

## 2016-06-18 ENCOUNTER — Telehealth: Payer: Self-pay | Admitting: Pediatrics

## 2016-06-18 MED ORDER — SULFAMETHOXAZOLE-TRIMETHOPRIM 800-160 MG PO TABS: 1.0000 | ORAL_TABLET | Freq: Two times a day (BID) | ORAL | 2 refills | Status: AC

## 2016-06-18 NOTE — Telephone Encounter (Signed)
Refill for bactrim sent

## 2016-06-20 ENCOUNTER — Ambulatory Visit (INDEPENDENT_AMBULATORY_CARE_PROVIDER_SITE_OTHER): Payer: Medicaid Other

## 2016-07-04 ENCOUNTER — Telehealth: Payer: Self-pay | Admitting: Pediatrics

## 2016-07-04 ENCOUNTER — Ambulatory Visit (INDEPENDENT_AMBULATORY_CARE_PROVIDER_SITE_OTHER): Payer: Medicaid Other | Admitting: Pediatrics

## 2016-07-04 NOTE — Telephone Encounter (Signed)
Form filled

## 2016-07-05 ENCOUNTER — Ambulatory Visit (INDEPENDENT_AMBULATORY_CARE_PROVIDER_SITE_OTHER): Payer: Medicaid Other | Admitting: Family

## 2016-07-10 ENCOUNTER — Encounter (INDEPENDENT_AMBULATORY_CARE_PROVIDER_SITE_OTHER): Payer: Self-pay | Admitting: Pediatrics

## 2016-07-10 ENCOUNTER — Telehealth (INDEPENDENT_AMBULATORY_CARE_PROVIDER_SITE_OTHER): Payer: Self-pay | Admitting: *Deleted

## 2016-07-10 ENCOUNTER — Telehealth: Payer: Self-pay | Admitting: Pediatrics

## 2016-07-10 NOTE — Telephone Encounter (Signed)
  Who's calling (name and relationship to patient) : Zachary [name], Mother  Best contact number: [phone]  Provider they see: Dr. Sharene SkeansHickling  Reason for call: Mother called requesting an updated letter like the one that was written in Dec. 2016(copy in Epic).  She stated she needs the letter by Thursday, Feb. 8.  Please give her a call for any questions and/or to pick up the letter.     PRESCRIPTION REFILL ONLY  Name of prescription:  Pharmacy:

## 2016-07-10 NOTE — Telephone Encounter (Signed)
Letter was created and printed, Tiffanie will contact mom.

## 2016-07-10 NOTE — Telephone Encounter (Signed)
Mom called and said she needed a letter written for Zachary RuizJohn for his services stating he needs 24 hour care, etc  Mom stated that Dr Barney Drainamgoolam wrote a letter a year ago for the same reason.

## 2016-07-14 ENCOUNTER — Other Ambulatory Visit: Payer: Self-pay | Admitting: Family

## 2016-07-14 ENCOUNTER — Other Ambulatory Visit (INDEPENDENT_AMBULATORY_CARE_PROVIDER_SITE_OTHER): Payer: Self-pay | Admitting: Pediatrics

## 2016-07-14 DIAGNOSIS — G40209 Localization-related (focal) (partial) symptomatic epilepsy and epileptic syndromes with complex partial seizures, not intractable, without status epilepticus: Secondary | ICD-10-CM

## 2016-07-14 DIAGNOSIS — G40319 Generalized idiopathic epilepsy and epileptic syndromes, intractable, without status epilepticus: Secondary | ICD-10-CM

## 2016-07-16 NOTE — Telephone Encounter (Signed)
Letter written

## 2016-07-18 ENCOUNTER — Encounter (INDEPENDENT_AMBULATORY_CARE_PROVIDER_SITE_OTHER): Payer: Medicaid Other | Admitting: Pediatrics

## 2016-07-18 ENCOUNTER — Ambulatory Visit (INDEPENDENT_AMBULATORY_CARE_PROVIDER_SITE_OTHER): Payer: Medicaid Other | Admitting: Pediatrics

## 2016-07-18 ENCOUNTER — Encounter (INDEPENDENT_AMBULATORY_CARE_PROVIDER_SITE_OTHER): Payer: Self-pay | Admitting: Pediatrics

## 2016-07-18 VITALS — BP 118/90 | HR 76 | Wt 172.0 lb

## 2016-07-18 DIAGNOSIS — F840 Autistic disorder: Secondary | ICD-10-CM | POA: Diagnosis not present

## 2016-07-18 DIAGNOSIS — F71 Moderate intellectual disabilities: Secondary | ICD-10-CM | POA: Diagnosis not present

## 2016-07-18 DIAGNOSIS — G808 Other cerebral palsy: Secondary | ICD-10-CM | POA: Diagnosis not present

## 2016-07-18 DIAGNOSIS — R451 Restlessness and agitation: Secondary | ICD-10-CM

## 2016-07-18 DIAGNOSIS — G40209 Localization-related (focal) (partial) symptomatic epilepsy and epileptic syndromes with complex partial seizures, not intractable, without status epilepticus: Secondary | ICD-10-CM

## 2016-07-18 MED ORDER — LORAZEPAM 1 MG PO TABS: ORAL_TABLET | ORAL | 5 refills | Status: DC

## 2016-07-18 NOTE — Patient Instructions (Signed)
Please sign up for My Chart so that it can facilitate communications with our office

## 2016-07-18 NOTE — Progress Notes (Signed)
Patient: Zachary [name] MRN: [id number] Sex: male DOB: 1991/01/28  Provider: Ellison Carwin, MD Location of Care: [facility]  Note type: Routine return visit  History of Present Illness: Referral Source: [name], MD History from: mother and [facility] chart Chief Complaint: VNS/Epilepsy  [name] is a 26 y.o. male who returns on July 18, 2016 for the first time since February 01, 2016.  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  He has responded well to three antiepileptic drugs plus vagal nerve stimulator.  In truth the vagal nerve stimulator was the most important treatment modality that has brought about much better control in his seizures.  His mother had concerns that his neck is being irritated.  He tends to scratch left side of his neck which is where the VNS lead is buried and has created some abrasions to his skin.  Mother believes that his left ear has been swollen and bleeding and the right ear has been leaking some sort of fluid.  I looked at his ears today and I am unable to find any significant abnormalities.  He  had infrequent generalized tonic-clonic seizures; two or three in January.  Thus far in February, he did not have a convulsive seizure, but he became wobbly getting out of bed and went to the floor.  Mother swiped the VNS and he came around quickly.  I suspect that this was a complex partial seizure that may have been aborted by stimulating the VNS.  He has periods of time when he becomes very agitated and lorazepam has done well to lessen the agitation and allow him to sleep.  In general, he sleeps well.  His appetite is good.  I am concerned that he gained another 8 pounds.  He has fairly significant truncal obesity.  This is more concerning because he is not able to be physically active even though he can walk.  He has limited means to burn calories unless he swims.   Fortunately, he likes to do that and I encouraged his mother to facilitate that.  I interrogated his vagal nerve stimulator, but did not change it.  This is recorded below.  Procedure: interrogation of vagal nerve stimulator  The implanted device is series 102, serial [id number], implanted May 23, 2011.  On interrogation: amplitude 2.50 mA, frequency 20 Hz, pulse width 250 s, stimulation duration 7 seconds, stimulation interval 0.8 minutes, magnet amplitude 2.75 mA, magnet stimulation duration 60 seconds, magnet pulse width 250 s.  Number of stimuli since last visit is 18; total number since implantation: 661.  System diagnostics with an amplitude of 1.0 milliamps again showed intact communication, output, and impedance. DC/ DC conversion code was 3. There is no indication to change the battery.  Review of Systems: 12 system review was remarkable for left ear swollen as well left side of face/neck, right ear leakage; the remainder was assessed and was negative  Past Medical History Diagnosis Date  . 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 Theressa Stamps. Patient's AHI was 39.6 RDI is 43.6 positional component. Patient will need desensitization and a gentle approach to CPAP titration.   . CP (cerebral palsy) (HCC)   . Fracture of femur, intertrochanteric, closed (HCC)   . Hypertension   . MR (mental retardation)   . Recurrent apnea   . Seizures (HCC)   . 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 Marland Kitchen    Hospitalizations: No., Head Injury: No., Nervous System Infections: No., Immunizations up to date: Yes.    I began to see him on July 10, 2004. He apparently was seen by me when he was eight months of age with developmental delay. Seizure activity; however, did not began until he is 14, two days after starting amitriptyline. 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 His most recent CT scan of the brain was on September 29, 2009, and showed no significant changes.  EEG showed diffuse background slowing.   He was treated with Depakote, Topamax, and Lyrica, but suffered side effects and agitation from those medications. He has taken Trileptal, Keppra, and Vimpat for quite some time. He was seen at [facility] on Oct 27, 2010 for 6 days.for a phase 1 evaluation. MRI of the brain showed ex-vacuo dilatation of his ventricles and cortical atrophy. Volume loss most pronounced in the parietal lobes bilaterally, temporal lobes with widening of the sylvian fissures and a paucity of white matter diffusely most pronounced again in the parietal lobes with patchy T2 hyperintensity. 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 There was no evidence of mesial temporal sclerosis.  Magnetoencephalogram showed epileptiform activity arising from the left posterior temple region with a field extending to the occipital lobe. This was not seen with concurrent routine EEG.  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 This was somewhat worse in the right than the left bilateral temporal cortex and temporal pole suggesting a longstanding insult.  Prolonged video EEG showed onset of bifrontal slowing that evolved into rhythmic activity with spikes of greater amplitude over the right central region with secondary generalization. Clinically, the patient has onset of a myoclonic jerk with his head turned to the left, movement of the left arm and leg, then tonic posturing followed by tonic-clonic generalized movements.  One day later he had  bifrontal rhythmic beta range activity followed by decrement in voltage with fast beta range activity for about 30 seconds and then generalized rhythmic beta range activity. The patient had a slight head bob slowly lowered to the bed with stiffening of both upper extremities and tonic-clonic movement of both upper extremities followed by secondary generalization in his legs. A total of seven seizures were monitored. The last three were obscured by electrode artifact. Seizure onset appeared to occur from both frontal regions. This led to the recommendations to place the vagal nerve stimulator.  .  He has frequent ear infections, urinary tract infections, constipation and a hip fracture. He has had problems with neutropenia and thrombocytopenia. He had some vomiting in June, 2010 and developed aspiration pneumonia. He tends to have a temperature of 99 in the late evening. He has had several episodes in which his left knee has been dislocated. This happened as recently as August 28, 2009. He sat down in a chair and his knee went out of place. EMS was called and he was taken to the ER. While waiting for the ER physician, Arvon stretched and his knee went back in to place. His mother said that he was subsequently seen by his orthopedist, who thinks that he  may eventually need surgery on his knee.   Behavior History none  Surgical History Procedure Laterality Date  . FOOT SURGERY    . FRACTURE SURGERY    . HIP SURGERY    . IMPLANTATION VAGAL NERVE STIMULATOR    . Pediatomy  2009  . TONSILLECTOMY  2009  . TYMPANOSTOMY TUBE PLACEMENT  2009   Family History family history includes Cancer in his paternal grandmother; Diabetes in his paternal grandfather. Family history is negative for migraines, seizures, intellectual disabilities, blindness, deafness, birth defects, chromosomal disorder, or autism.  Social History . Marital status: Single    Spouse name: N/A  . Number of children: N/A  . Years of  education: N/A   Social History Main Topics  . Smoking status: Never Smoker  . Smokeless tobacco: Never Used  . Alcohol use No  . Drug use: No  . 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     He enjoys swimming and going to the beach.   Allergies Allergen Reactions  . Depakote [Divalproex Sodium] Other (See Comments)    Low Platelets  . Lyrica [Pregabalin] Other (See Comments)    Sleepiness  . Topamax [Topiramate] Other (See Comments)    Weight Loss   Physical Exam BP 118/90   Pulse 76   Wt 172 lb (78 kg)   BMI 33.04 kg/m   General: alert, well developed, well nourished, in no acute distress, black hair, brown eyes, right handed Head: normocephalic, no dysmorphic features Ears, Nose and Throat: Otoscopic: tympanic membranes normal; pharynx: oropharynx is pink without exudates or tonsillar hypertrophy Neck: supple, full range of motion, no cranial or cervical bruits Respiratory: auscultation clear Cardiovascular: no murmurs, pulses are normal Musculoskeletal: no skeletal deformities or apparent scoliosis Skin: no rashes or neurocutaneous lesions  Neurologic Exam  Mental Status: alert; He makes limited eye contact, he does not speak, uses efforts to examine him, he is unable to follow commands Cranial Nerves: visual fields suggest some neglect in the left visual field; extraocular movements show bilateral exotropia without nystagmus; disconjugate gaze with amblyopia in his right eye; pupils are round reactive to light; funduscopic examination shows positive reflexes bilaterally; impassive face; midline tongue and uvula; he occasionally turned to localize sounds bilaterally Motor: spastic left hemiparesis with flexion of his arm and tight fisting; he is able to use his right arm with clumsy fine motor movements Sensory: withdrawal 4 Coordination: unable to test  Gait and Station: arises from his  wheelchair with assistance, relatively narrow-based gait which is diplegic and shortened stride; he was able to walk without assistance Reflexes: symmetric and diminished bilaterally; no clonus  Assessment 1. Complex partial seizures involving to generalized tonic-clonic seizures, G40.209. 2. 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 3. 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 4. 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 5. Agitation, R45.1.  Discussion Ki is doing quite well.  I am concerned about his weight gain and hope that his mother is able to decrease his intake so that his weight is maintained he possibly loses a little.  The vagal nerve stimulator is working well and his seizures are infrequent.  I do not think the itching his neck or digging into his ears is caused by his vagal nerve stimulator or otitis media or externa.  I think that it is a self-stimulating behavior.    Plan I made  no changes in his medications and only needed to refill his lorazepam for agitation.  He will return to see me in three months' time.  I spent 15 minutes of face-to-face time with Jonny RuizJohn and his mother and in addition interrogated, but did not reprogram his vagal nerve stimulator.   Medication List   Accurate as of 07/18/16  9:35 AM      albuterol (2.5 MG/3ML) 0.083% nebulizer solution Commonly known as:  PROVENTIL Take 3 mLs (2.5 mg total) by nebulization every 6 (six) hours as needed for wheezing or shortness of breath.   cetirizine 10 MG tablet Commonly known as:  ZYRTEC Take 1 tablet (10 mg total) by mouth daily.   cetirizine 10 MG tablet Commonly known as:  ZYRTEC Take 1 tablet (10 mg total) by mouth daily as needed for allergies.   DERMA-SMOOTHE/FS SCALP 0.01 % Oil Generic drug:  Fluocinolone Acetonide Scalp APPLY TWICE DAILY   DIASTAT ACUDIAL 20 MG Gel Generic drug:  diazepam Give 15 mg rectally after 2 minutes of  seizure.   docusate sodium 100 MG capsule Commonly known as:  COLACE Take 200 mg by mouth daily.   fluticasone 50 MCG/ACT nasal spray Commonly known as:  FLONASE Place 1 spray into both nostrils daily.   KEPPRA 1000 MG tablet Generic drug:  levETIRAcetam Take 1 and 1/2 tablets twice per day   ketoconazole 2 % cream Commonly known as:  NIZORAL Apply 1 application topically daily.   LORazepam 1 MG tablet Commonly known as:  ATIVAN Take 1 tablet by mouth as needed for agitation.   midazolam 5 MG/ML injection Commonly known as:  VERSED Place 2 mLs (10 mg total) into the nose once. Draw up 1ml in 2 syringes. Remove blue vial access device. Attach syringe to nasal atomizer for intranasal administration. Give 1ml in each nostril for seizures lasting 2 minutes or longer.   mupirocin ointment 2 % Commonly known as:  BACTROBAN Apply 1 application topically 2 (two) times daily. To affected skin.   NIFEdipine 60 MG 24 hr tablet Commonly known as:  PROCARDIA XL/ADALAT-CC Take 60 mg by mouth daily.   OXTELLAR XR 600 MG Tb24 Generic drug:  OXcarbazepine ER TAKE 3 TABLETS AT BEDTIME   VIMPAT 100 MG Tabs Generic drug:  Lacosamide TAKE ONE TABLET IN THE MORNING AND TAKE ONE TABLET AT 3PM   VIMPAT 200 MG Tabs tablet Generic drug:  lacosamide TAKE 2 TABLETS AT BEDTIME    The medication list was reviewed and reconciled. All changes or newly prescribed medications were explained.  A complete medication list was provided to the patient/caregiver.  Deetta PerlaWilliam H Tammy Wickliffe MD

## 2016-07-24 ENCOUNTER — Telehealth: Payer: Self-pay | Admitting: Pediatrics

## 2016-07-24 NOTE — Telephone Encounter (Signed)
[facility] called and would like Dr Barney Drainamgoolam to sign the Certification of Medical Necessity for Jonny RuizJohn and date the form 07-05-16. The representative from [facility] stated that Dr Barney Drainamgoolam signed the form covering the dates 07-19-16 but needs a form dated 07-05-13 to cover services 07-05-13 through 07-19-16. She would like the forms faxed to [phone]  The form is on  Dr [employer] Corporationamgoolam's desk

## 2016-07-25 NOTE — Telephone Encounter (Signed)
Form filled and dated 07/05/16

## 2016-08-16 ENCOUNTER — Other Ambulatory Visit: Payer: Self-pay | Admitting: Family

## 2016-08-16 ENCOUNTER — Other Ambulatory Visit (INDEPENDENT_AMBULATORY_CARE_PROVIDER_SITE_OTHER): Payer: Self-pay | Admitting: Pediatrics

## 2016-08-16 DIAGNOSIS — G40319 Generalized idiopathic epilepsy and epileptic syndromes, intractable, without status epilepticus: Secondary | ICD-10-CM

## 2016-08-16 DIAGNOSIS — G40209 Localization-related (focal) (partial) symptomatic epilepsy and epileptic syndromes with complex partial seizures, not intractable, without status epilepticus: Secondary | ICD-10-CM

## 2016-09-26 ENCOUNTER — Telehealth (INDEPENDENT_AMBULATORY_CARE_PROVIDER_SITE_OTHER): Payer: Self-pay | Admitting: *Deleted

## 2016-09-26 NOTE — Telephone Encounter (Signed)
  Who's calling (name and relationship to patient) : Archie Patten, mother  Best contact number: [phone]  Provider they see: Dr. Sharene Skeans  Reason for call: Mother called in stating Brogan is having an increase in seizures.  She stated that he has had 3 today and is on her way to pick him up from school.  She said he has had a total of 5 in the past 2 weeks.  She would like a return call as soon as possible.  She can be reached at [phone].     PRESCRIPTION REFILL ONLY  Name of prescription:  Pharmacy:

## 2016-09-26 NOTE — Telephone Encounter (Signed)
I offered return visit on April 27 at 10:15.  I asked mother to be here at 73.  I'll be happy to speak to her if she needs to speak to me.

## 2016-09-27 NOTE — Telephone Encounter (Signed)
Floy has been scheduled for April 27 @ 10:15

## 2016-09-28 ENCOUNTER — Encounter (INDEPENDENT_AMBULATORY_CARE_PROVIDER_SITE_OTHER): Payer: Self-pay | Admitting: Pediatrics

## 2016-09-28 ENCOUNTER — Ambulatory Visit (INDEPENDENT_AMBULATORY_CARE_PROVIDER_SITE_OTHER): Payer: Medicaid Other | Admitting: Pediatrics

## 2016-09-28 VITALS — BP 116/96 | HR 88 | Wt 176.0 lb

## 2016-09-28 DIAGNOSIS — G4731 Primary central sleep apnea: Secondary | ICD-10-CM

## 2016-09-28 DIAGNOSIS — F71 Moderate intellectual disabilities: Secondary | ICD-10-CM | POA: Diagnosis not present

## 2016-09-28 DIAGNOSIS — I10 Essential (primary) hypertension: Secondary | ICD-10-CM | POA: Diagnosis not present

## 2016-09-28 DIAGNOSIS — G40209 Localization-related (focal) (partial) symptomatic epilepsy and epileptic syndromes with complex partial seizures, not intractable, without status epilepticus: Secondary | ICD-10-CM

## 2016-09-28 DIAGNOSIS — F840 Autistic disorder: Secondary | ICD-10-CM | POA: Diagnosis not present

## 2016-09-28 DIAGNOSIS — G808 Other cerebral palsy: Secondary | ICD-10-CM

## 2016-09-28 NOTE — Patient Instructions (Signed)
Please let me know if the seizures continue.  Also let me know when a BUN/creatinine is performed so that we can discuss side whether or not to change the dose of Keppra.  I do not feel comfortable increasing the doses of any of the medications.  Vagal nerve stimulator is at an optimal setting.  We are going to have to observe without changes.  Fycompa would be a possible alternative if seizures persist.

## 2016-09-28 NOTE — Progress Notes (Signed)
Patient: Zachary [name] MRN: [id number] Sex: male DOB: 1990-12-09  Provider: Ellison Carwin, MD Location of Care: [facility]  Note type: Routine return visit  History of Present Illness: Referral Source: [name], MD History from: mother, patient and [facility] chart Chief Complaint: VNS/Epilepsy  Zachary [name] is a 26 y.o. male who was evaluated on September 28, 2016 for the first time since July 18, 2016.  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  In the past couple of years he has responded well to three antiepileptic drugs plus vagal nerve stimulator.  The latter was more important in controlling his seizures and the medications.  He is here today because there have been seven seizures since September 16, 2016.  He had two nocturnal seizures each a minute in duration where he became stiff and had labored breathing and his eyes were open and unresponsive.  On September 21, 2016 the same thing happened.  There were two more nocturnal brief tonic seizures.  On September 26, 2016, he had one in the early morning hours as he was still asleep and then he had two at school that were two minutes apart lasting for about 45 seconds each with a minute in between.  He stiffened, had labored breathing with his eyes were open.  He has been switched from nifedipine and losartan for hypertension.  He has continued to have persistent hypertension.  He is followed by Dr. Signe Colt who is a nephrologist at [facility].  She said that the medication might lower his seizure threshold.  I have no personal experience with that medicine, but no reason to think that it would cause that problem.  He was switched off nifedipine because he had brawny edema in the feet and ankles.  It is not pitting.  It has not changed significantly since he came off the medication.  He does not show any signs of congestive heart failure.  I think that his  nutrition is good enough that this does not represent protein calorie malnutrition.  His health has generally been good.  I am concerned, however, that there may be some form of kidney issue and if there is, levetiracetam becomes problematic because it is eliminated from the body by the kidneys and levels will increase if creatinine clearance declines.  I interrogated his vagal nerve stimulator, but did not reprogram the results are below.  Procedure: interrogation of vagal nerve stimulator  The implanted device is series 102, serial [id number], implanted May 23, 2011.  On interrogation: amplitude 2.50 mA, frequency 20 Hz, pulse width 250 s, stimulation duration 7 seconds, stimulation interval 0.8 minutes, magnet amplitude 2.75 mA, magnet stimulation duration 60 seconds, magnet pulse width 250 s.  Number of stimuli since last visit is 13; total number since implantation: 674.  Systemdiagnostics with an amplitude of 1.32milliamps again showed intact communication, output, and impedance. DC/ DC conversion code was 1. There is no indication to change the battery.  Review of Systems: 12 system review was remarkable for five seizures in the past two weeks, new blood pressure medication; the remainder was assessed and was negative  Past Medical History Diagnosis Date  . 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 Theressa Stamps. Patient's AHI was 39.6 RDI is 43.6 positional component. Patient will need desensitization and a gentle approach to CPAP titration.   . CP (cerebral palsy) (HCC)   . Fracture of femur,  intertrochanteric, closed (HCC)   . Hypertension   . MR (mental retardation)   . Recurrent apnea   . Seizures (HCC)   . 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 Marland Kitchen    Hospitalizations: No., Head Injury: No., Nervous System Infections: No.,  Immunizations up to date: Yes.    I began to see him on July 10, 2004. He apparently was seen by me when he was eight months of age with developmental delay. Seizure activity; however, did not began until he is 14, two days after starting amitriptyline. 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 His most recent CT scan of the brain was on September 29, 2009, and showed no significant changes.  EEG showed diffuse background slowing.   He was treated with Depakote, Topamax, and Lyrica, but suffered side effects and agitation from those medications. He has taken Trileptal, Keppra, and Vimpat for quite some time. He was seen at [facility] on Oct 27, 2010 for 6 days.for a phase 1 evaluation. MRI of the brain showed ex-vacuo dilatation of his ventricles and cortical atrophy. Volume loss most pronounced in the parietal lobes bilaterally, temporal lobes with widening of the sylvian fissures and a paucity of white matter diffusely most pronounced again in the parietal lobes with patchy T2 hyperintensity. 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 There was no evidence of mesial temporal sclerosis.  Magnetoencephalogram showed epileptiform activity arising from the left posterior temple region with a field extending to the occipital lobe. This was not seen with concurrent routine EEG.  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 This was somewhat worse in the right than the left bilateral temporal cortex and temporal pole suggesting a longstanding insult.  Prolonged video EEG showed onset of bifrontal slowing that evolved into rhythmic activity with spikes of greater amplitude over  the right central region with secondary generalization. Clinically, the patient has onset of a myoclonic jerk with his head turned to the left, movement of the left arm and leg, then tonic posturing followed by tonic-clonic generalized movements.  One day later he had bifrontal rhythmic beta range activity followed by decrement in voltage with fast beta range activity for about 30 seconds and then generalized rhythmic beta range activity. The patient had a slight head bob slowly lowered to the bed with stiffening of both upper extremities and tonic-clonic movement of both upper extremities followed by secondary generalization in his legs. A total of seven seizures were monitored. The last three were obscured by electrode artifact. Seizure onset appeared to occur from both frontal regions. This led to the recommendations to place the vagal nerve stimulator.  .  He has frequent ear infections, urinary tract infections, constipation and a hip fracture. He has had problems with neutropenia and thrombocytopenia. He had some vomiting in June, 2010 and developed aspiration pneumonia. He tends to have a temperature of 99 in the late evening. He has had several episodes in which his left knee has been dislocated. This happened as recently as August 28, 2009. He sat down in a chair and his knee went out of place. EMS was called and he  was taken to the ER. While waiting for the ER physician, Zachary [name] stretched and his knee went back in to place. His mother said that he was subsequently seen by his orthopedist, who thinks that he may eventually need surgery on his knee.   Behavior History none  Surgical History Procedure Laterality Date  . FOOT SURGERY    . FRACTURE SURGERY    . HIP SURGERY    . IMPLANTATION VAGAL NERVE STIMULATOR    . Pediatomy  2009  . TONSILLECTOMY  2009  . TYMPANOSTOMY TUBE PLACEMENT  2009   Family History family history includes Cancer in his paternal grandmother; Diabetes in his  paternal grandfather. Family history is negative for migraines, seizures, intellectual disabilities, blindness, deafness, birth defects, chromosomal disorder, or autism.  Social History Social History Main Topics  . Smoking status: Never Smoker  . Smokeless tobacco: Never Used  . Alcohol use No  . Drug use: No  . 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     He enjoys swimming and going to the beach.   Allergies Allergen Reactions  . Depakote [Divalproex Sodium] Other (See Comments)    Low Platelets  . Lyrica [Pregabalin] Other (See Comments)    Sleepiness  . Topamax [Topiramate] Other (See Comments)    Weight Loss   Physical Exam BP (!) 130/112   Pulse 88   Wt 176 lb (79.8 kg)   BMI 33.81 kg/m   General: alert, well developed, well nourished, in no acute distress, black hair, brown eyes, right handed Head: normocephalic, no dysmorphic features Ears, Nose and Throat: Otoscopic: tympanic membranes normal; pharynx: oropharynx is pink without exudates or tonsillar hypertrophy Neck: supple, full range of motion, no cranial or cervical bruits Respiratory: auscultation clear Cardiovascular: no murmurs, pulses are normal Musculoskeletal: no skeletal deformities or apparent scoliosis; tight heel cords, increased tone in all 4 extremities Skin: no rashes or neurocutaneous lesions  Neurologic Exam  Mental Status: alert; made eye contact, does not speak, refuses efforts to examine him, unable to follow commands Cranial Nerves: visual fields are full to double simultaneous stimuli; he did not show neglect to objects in his left visual field today; bilateral exotropia without nystagmus disconjugate gaze, amblyopia in his right eye; pupils are round reactive to light; funduscopic examination shows sharp disc margins with normal vessels; symmetric facial strength; midline tongue and uvula; air conduction is  greater than bone conduction bilaterally Motor: Spastic left hemiparesis with flexion of his left arm and tightfisted increase able to use his right arm was clumsy fine motor movements, legs are weak, with increased tone; diplegia Sensory: Withdraw 4 Coordination: Unable to test Gait and Station: Diplegic gait, broad-based, shortened stride can walk without assistance once he stands Reflexes: symmetric and diminished bilaterally; no clonus; bilateral flexor plantar responses  Assessment 1. 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 2. 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 3. Complex sleep apnea syndrome, G47.31. 4. 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 5. 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 6. Essential hypertension, I10.  Discussion I am not certain why Zachary [name]'s seizures have worsened.  His nephrologist thinks that it may be the losartan.  I have no history with this medicine.  He has experienced exacerbations of his seizures before and things usually settle down.  He is at very high doses of levetiracetam, oxcarbazepine, and Vimpat.  I am reluctant to increase any of those doses.  Similarly he is near is the top duty cycle for his vagal nerve stimulator.  Plan We are going to observe now without making changes.  I am less optimistic than his mother that this is just a matter of getting his blood pressure under control and things will settle down.  His congenital quadriplegia is unchanged as is his autism with moderate intellectual disability.  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  He has sleep apnea that was diagnosed by Dr. Vickey Huger.  He wears CPAP but he does not keep it on all night long.  I think it still improves his sleep and it may very well have improved his seizure control.  I do not know if he has essential hypertension or some other etiology.  I am worried  that there may be some renovascular issues and if there are, then we may need to cut his levetiracetam.  I interrogated and restored the vagal nerve stimulator to the settings that he had when he came to the office today.  I spent 25 minutes of face-to-face time with Zachary [name] in addition considering the issues related to his increased seizures.  I discussed other possibilities which might include the medication change.  He will return to see me in three months' time, but I will see him sooner based on clinical need.   Medication List   Accurate as of 09/28/16 10:14 AM.      albuterol (2.5 MG/3ML) 0.083% nebulizer solution Commonly known as:  PROVENTIL Take 3 mLs (2.5 mg total) by nebulization every 6 (six) hours as needed for wheezing or shortness of breath.   cetirizine 10 MG tablet Commonly known as:  ZYRTEC Take 1 tablet (10 mg total) by mouth daily.   cetirizine 10 MG tablet Commonly known as:  ZYRTEC Take 1 tablet (10 mg total) by mouth daily as needed for allergies.   DERMA-SMOOTHE/FS SCALP 0.01 % Oil Generic drug:  Fluocinolone Acetonide Scalp APPLY TWICE DAILY   DIASTAT ACUDIAL 20 MG Gel Generic drug:  diazepam Give 15 mg rectally after 2 minutes of seizure.   docusate sodium 100 MG capsule Commonly known as:  COLACE Take 200 mg by mouth daily.   fluticasone 50 MCG/ACT nasal spray Commonly known as:  FLONASE Place 1 spray into both nostrils daily.   KEPPRA 1000 MG tablet Generic drug:  levETIRAcetam Take 1 and 1/2 tablets twice per day   ketoconazole 2 % cream Commonly known as:  NIZORAL Apply 1 application topically daily.   LORazepam 1 MG tablet Commonly known as:  ATIVAN Take 1 tablet by mouth as needed for agitation.   midazolam 5 MG/ML injection Commonly known as:  VERSED Place 2 mLs (10 mg total) into the nose once. Draw up 1ml in 2 syringes. Remove blue vial access device. Attach syringe to nasal atomizer for intranasal administration. Give 1ml in each  nostril for seizures lasting 2 minutes or longer.   mupirocin ointment 2 % Commonly known as:  BACTROBAN Apply 1 application topically 2 (two) times daily. To affected skin.   NIFEdipine 60 MG 24 hr tablet Commonly known as:  PROCARDIA XL/ADALAT-CC Take 60 mg by mouth daily.   OXTELLAR XR 600 MG Tb24 Generic drug:  OXcarbazepine ER TAKE 3 TABLETS AT BEDTIME   VIMPAT 100 MG Tabs Generic drug:  Lacosamide TAKE ONE TABLET IN THE MORNING AND TAKE ONE TABLET AT 3PM   VIMPAT 200 MG Tabs tablet Generic drug:  lacosamide TAKE 2 TABLETS AT BEDTIME    The medication list was reviewed and reconciled. All changes or newly prescribed medications were explained.  A complete medication list was provided to the patient/caregiver.  Deetta Perla MD

## 2016-10-16 ENCOUNTER — Ambulatory Visit (INDEPENDENT_AMBULATORY_CARE_PROVIDER_SITE_OTHER): Payer: Medicaid Other | Admitting: Pediatrics

## 2016-10-20 ENCOUNTER — Other Ambulatory Visit (INDEPENDENT_AMBULATORY_CARE_PROVIDER_SITE_OTHER): Payer: Self-pay | Admitting: Pediatrics

## 2016-10-20 DIAGNOSIS — G40209 Localization-related (focal) (partial) symptomatic epilepsy and epileptic syndromes with complex partial seizures, not intractable, without status epilepticus: Secondary | ICD-10-CM

## 2016-10-20 DIAGNOSIS — G40319 Generalized idiopathic epilepsy and epileptic syndromes, intractable, without status epilepticus: Secondary | ICD-10-CM

## 2016-10-22 ENCOUNTER — Encounter (INDEPENDENT_AMBULATORY_CARE_PROVIDER_SITE_OTHER): Payer: Self-pay

## 2016-10-22 ENCOUNTER — Ambulatory Visit (INDEPENDENT_AMBULATORY_CARE_PROVIDER_SITE_OTHER): Payer: Medicaid Other | Admitting: Pediatrics

## 2016-10-22 ENCOUNTER — Other Ambulatory Visit (INDEPENDENT_AMBULATORY_CARE_PROVIDER_SITE_OTHER): Payer: Self-pay

## 2016-10-22 VITALS — Temp 97.6°F

## 2016-10-22 DIAGNOSIS — K529 Noninfective gastroenteritis and colitis, unspecified: Secondary | ICD-10-CM

## 2016-10-22 MED ORDER — PROMETHAZINE HCL 25 MG RE SUPP: 25.0000 mg | Freq: Four times a day (QID) | RECTAL | 3 refills | Status: DC | PRN

## 2016-10-22 MED ORDER — LORATADINE 10 MG PO TABS: 10.0000 mg | ORAL_TABLET | Freq: Every day | ORAL | 12 refills | Status: DC

## 2016-10-22 NOTE — Telephone Encounter (Signed)
Call to mom [name]- advised rx refill on Keppra sent to the pharm. Requested she bring copy of guardianship paperwork to next OV for staff to scan into his chart. Mom states understanding and agrees. She report he had the labs drawn but has not had a urinalysis yet. She reports he did have 2 major seizures on Saturday and has been vomiting so he is going to his PCP today. Mom reports will call back to schedule July appt. She is not at home at this time.

## 2016-10-22 NOTE — Progress Notes (Signed)
Chest clear   Subjective:     Zachary [name] is a 26 y.o. male with significant developmental delay and spatic quadriplegia who presents for evaluation of vomiting X 3 last night. No fever, no diarrhea and no rash. Mom is worried that with his underlying condition he may have aspirated. Came in today to get checked and for medication for vomiting.  The following portions of the patient's history were reviewed and updated as appropriate: allergies, current medications, past family history, past medical history, past social history, past surgical history and problem list.  Review of Systems Pertinent items are noted in HPI.   Objective:    Temp 97.6 F (36.4 C) (Temporal)   SpO2 97%  General appearance: no distress Ears: normal TM's and external ear canals both ears Nose: no discharge Lungs: clear to auscultation bilaterally and no evidence of aspiration Chest wall: no tenderness Heart: regular rate and rhythm, S1, S2 normal, no murmur, click, rub or gallop Abdomen: soft, non-tender; bowel sounds normal; no masses,  no organomegaly Pulses: 2+ and symmetric Skin: Skin color, texture, turgor normal. No rashes or lesions Neurologic: baseline mental status  Assessment:    Gastroenteritis   Plan:    Antiemetics per medication orders. Dietary guidelines discussed. PRN antiemetic per medication orders. Follow up in a few days if not improving.   IF ANY WHEEZING OR DIFFICULTY BREATHING TO COME IN TO OFFICE OR GO TO ER RIGHT AWAY--explained to mom that signs of aspiration may take a few days to develop.

## 2016-10-22 NOTE — Telephone Encounter (Signed)
I reviewed the notes and agree with this plan.

## 2016-10-24 ENCOUNTER — Encounter: Payer: Self-pay | Admitting: Pediatrics

## 2016-10-24 DIAGNOSIS — K529 Noninfective gastroenteritis and colitis, unspecified: Secondary | ICD-10-CM | POA: Insufficient documentation

## 2016-10-24 NOTE — Patient Instructions (Signed)

## 2016-10-25 ENCOUNTER — Other Ambulatory Visit: Payer: Self-pay | Admitting: Pediatrics

## 2016-11-06 ENCOUNTER — Telehealth: Payer: Self-pay | Admitting: Pediatrics

## 2016-11-06 NOTE — Telephone Encounter (Signed)
Mother states child is walking on his ankles and when they get sore he start crawling. Ref[facility] to [facility] for 6 months,Has appointment on Friday 11/09/16

## 2016-11-13 NOTE — Telephone Encounter (Signed)
Agree with plan 

## 2016-11-19 ENCOUNTER — Other Ambulatory Visit: Payer: Self-pay | Admitting: Nephrology

## 2016-11-19 DIAGNOSIS — R600 Localized edema: Secondary | ICD-10-CM

## 2016-11-20 ENCOUNTER — Other Ambulatory Visit: Payer: Self-pay | Admitting: Family

## 2016-11-28 ENCOUNTER — Other Ambulatory Visit: Payer: Medicaid Other

## 2016-12-04 ENCOUNTER — Ambulatory Visit
Admission: RE | Admit: 2016-12-04 | Discharge: 2016-12-04 | Disposition: A | Discharge status: Home / Self Care | Payer: Medicaid Other | Source: Ambulatory Visit | Attending: Nephrology | Admitting: Nephrology

## 2016-12-04 DIAGNOSIS — R600 Localized edema: Secondary | ICD-10-CM

## 2016-12-16 IMAGING — US US RENAL
1 series · 14 of 25 positions shown · non-contrast
Comparison: CT 08/28/2012

CLINICAL DATA: Generalized edema.  Renal disease.

EXAM:
RENAL / URINARY TRACT ULTRASOUND COMPLETE

[Series 1: us renal · 0.25mm/px · 14 of 29 slices shown]
[im 1/29]
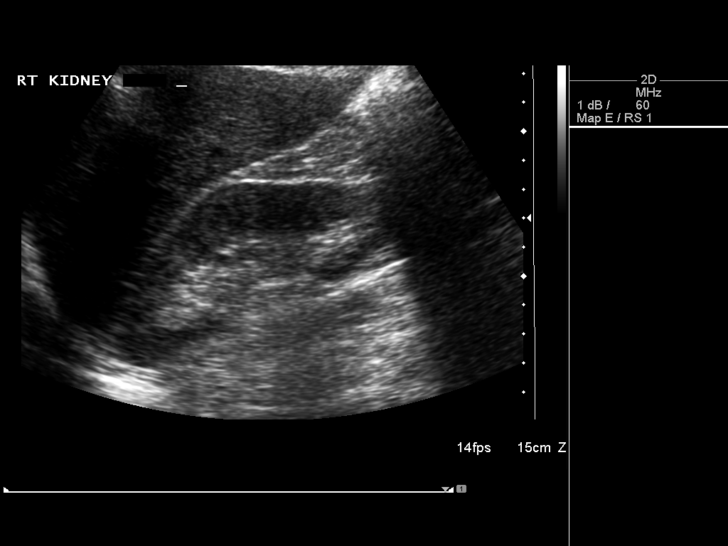
[im 3/29]
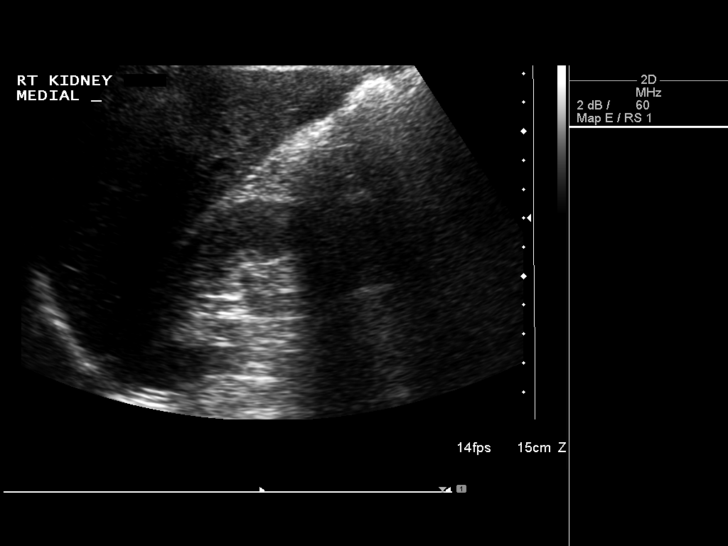
[im 5/29]
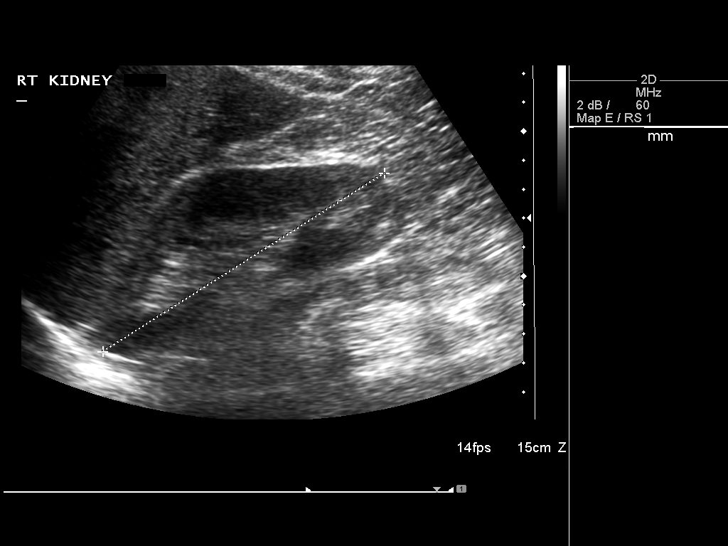
[im 8/29]
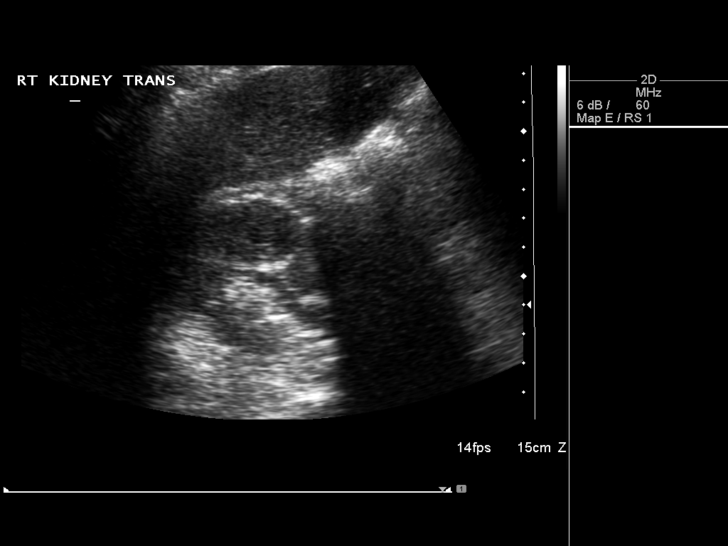
[im 10/29]
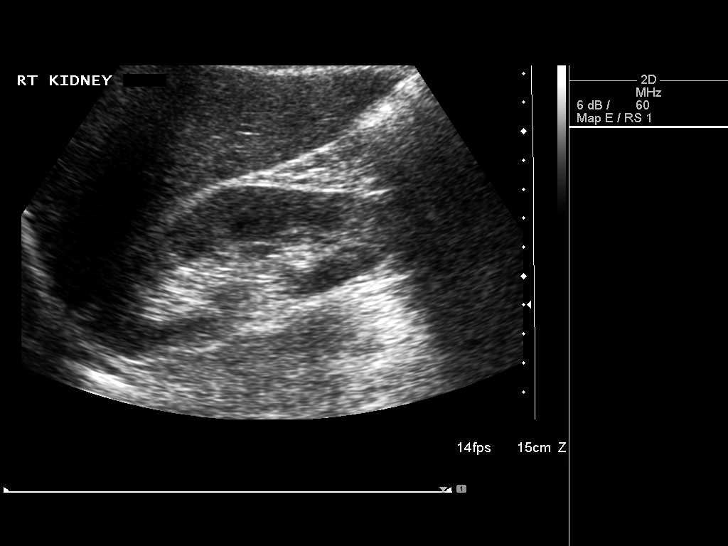
[im 11/29]
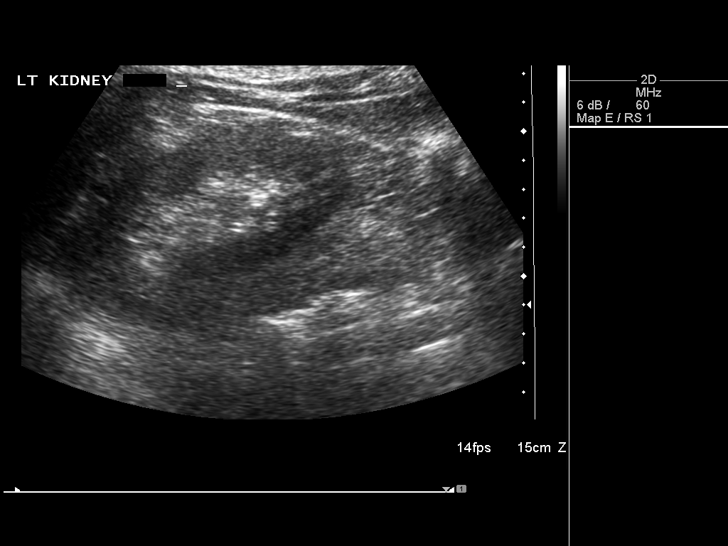
[im 13/29]
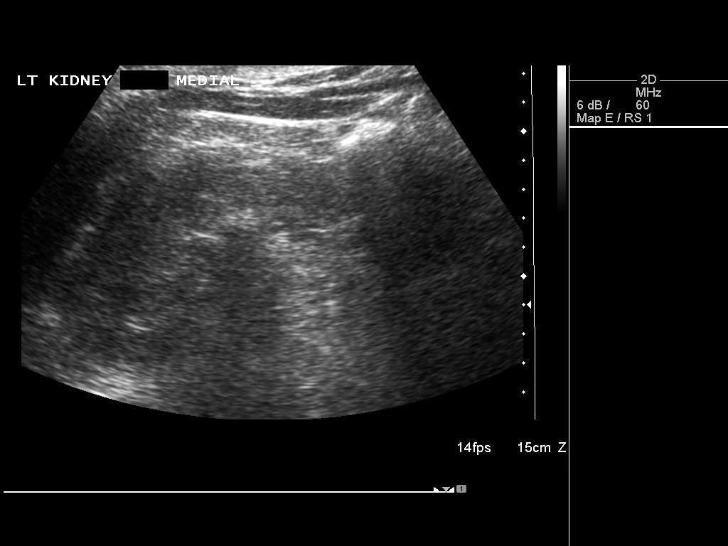
[im 16/29]
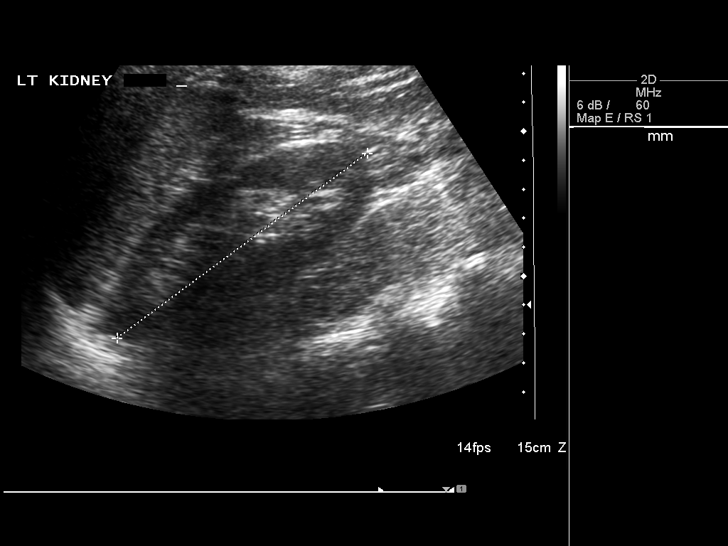
[im 18/29]
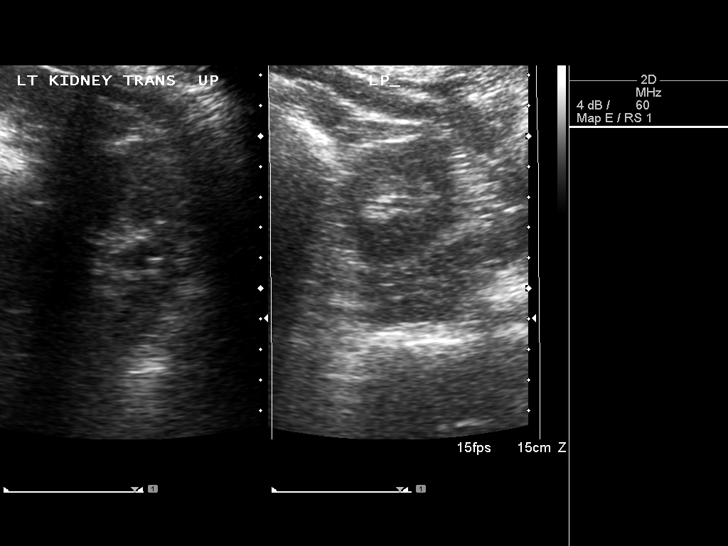
[im 19/29]
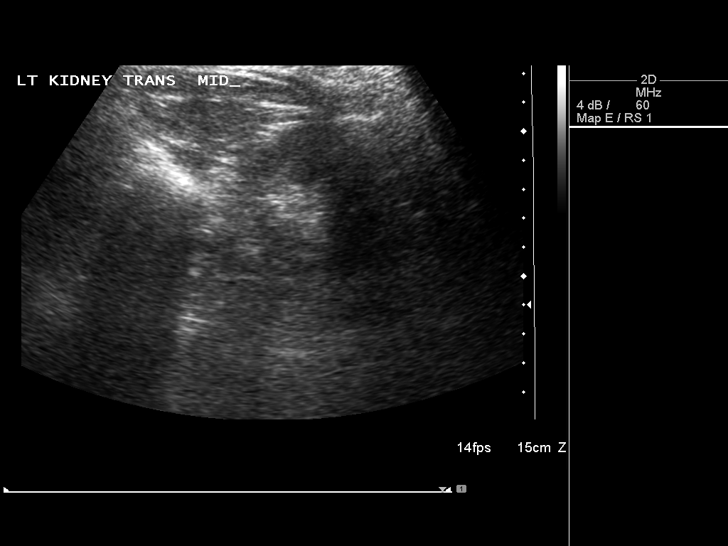
[im 22/29]
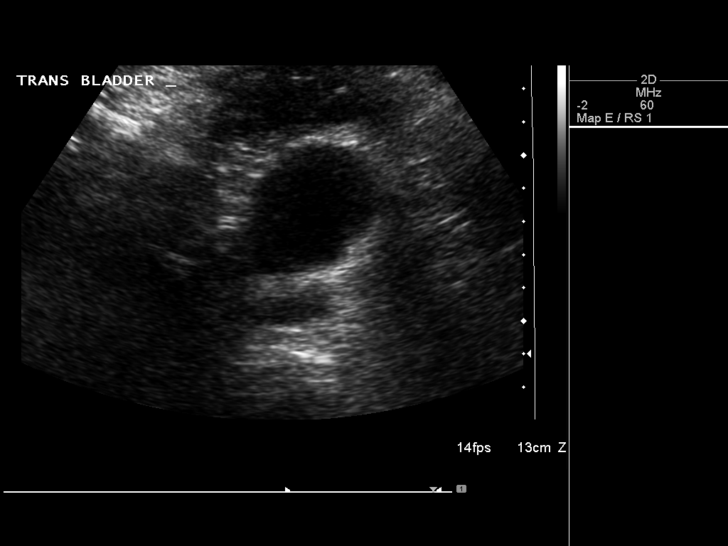
[im 24/29]
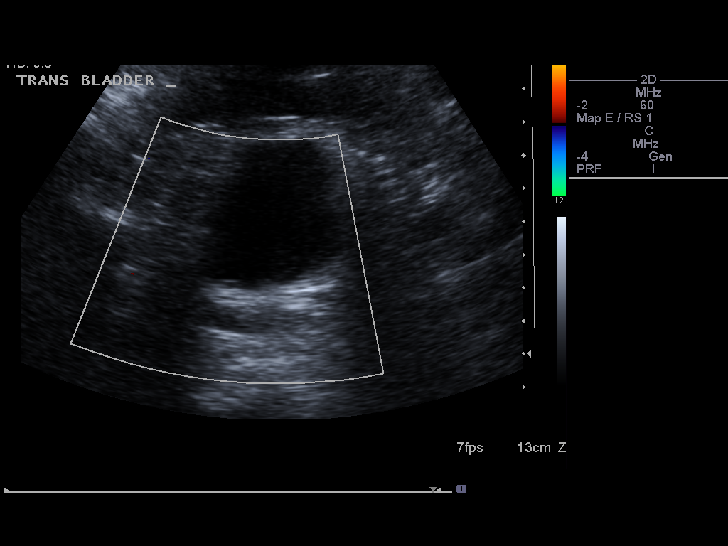
[im 26/29]
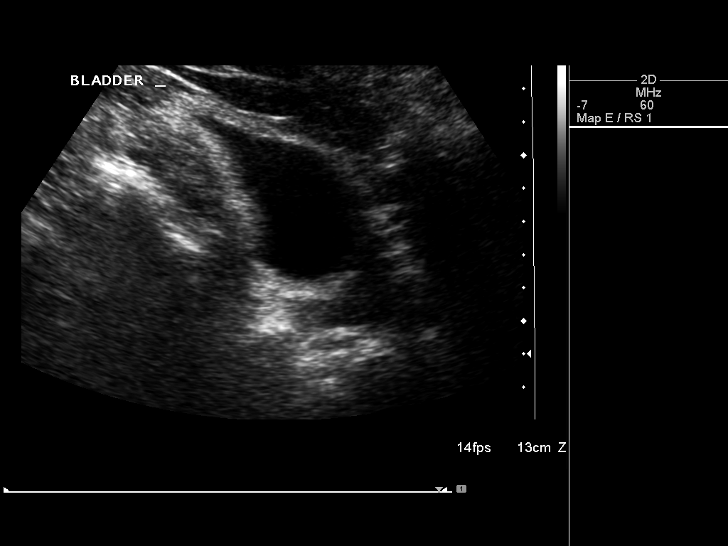
[im 29/29]
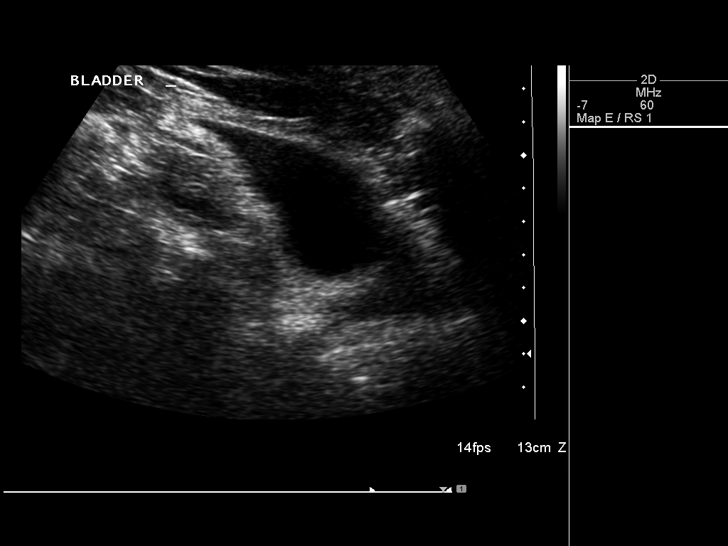

[14 of 25 positions shown; findings below may reference images not displayed]

FINDINGS: Right Kidney:

Length: 11.9 cm. Echogenicity within normal limits. No mass or
hydronephrosis visualized.

Left Kidney:

Length: 11.0 cm. Echogenicity within normal limits. No mass or
hydronephrosis visualized.

Bladder:

Appears normal for degree of bladder distention.
IMPRESSION: Unremarkable renal ultrasound.

## 2016-12-19 ENCOUNTER — Other Ambulatory Visit: Payer: Self-pay | Admitting: Family

## 2017-01-16 ENCOUNTER — Other Ambulatory Visit: Payer: Self-pay | Admitting: Pediatrics

## 2017-01-30 ENCOUNTER — Ambulatory Visit (INDEPENDENT_AMBULATORY_CARE_PROVIDER_SITE_OTHER): Payer: Medicaid Other | Admitting: Pediatrics

## 2017-01-30 VITALS — Wt 181.0 lb

## 2017-01-30 DIAGNOSIS — H109 Unspecified conjunctivitis: Secondary | ICD-10-CM

## 2017-01-30 MED ORDER — ERYTHROMYCIN 5 MG/GM OP OINT: 1.0000 "application " | TOPICAL_OINTMENT | Freq: Three times a day (TID) | OPHTHALMIC | 3 refills | Status: AC

## 2017-01-30 MED ORDER — HYDROXYZINE HCL 25 MG PO TABS: 25.0000 mg | ORAL_TABLET | Freq: Three times a day (TID) | ORAL | 2 refills | Status: AC | PRN

## 2017-01-30 NOTE — Progress Notes (Signed)
To call pharmacist for dermasmooothe replacement.  26 year old male with history of developmental delay/seizures/autism presents with nasal congestion and intermittent redness and tearing left eye for two days. No fever, no cough, no sore throat and no rash. No vomiting and no diarrhea.  The following portions of the patient's history were reviewed and updated as appropriate: allergies, current medications, past family history, past medical history, past social history, past surgical history and problem list.  Review of Systems Pertinent items are noted in HPI.     Objective:   General Appearance:    Baseline mental status  Head:    Normocephalic, without obvious abnormality, atraumatic  Eyes:    PERRL, conjunctiva/corneas mild erythema, tearing and mucoid discharge from left eye--right eye normal  Ears:    Normal TM tubes bilaterally--no fluid and no erythema  Nose:   Nares normal, septum midline, mucosa with erythema and mild congestion           Lungs:     Clear to auscultation bilaterally, respirations unlabored      Heart:    Regular rate and rhythm, S1 and S2 normal, no murmur, rub   or gallop     Abdomen:     Soft, non-tender, bowel sounds active all four quadrants,    no masses, no organomegaly        Extremities:   Extremities normal, atraumatic, no cyanosis or edema     Skin:   Skin color, texture, turgor normal, no rashes or lesions     Neurologic:   Alert, playful and active.       Assessment:    Acute conjunctivitis   Plan:   Topical ophthalmic antibiotic drops and follow as needed.

## 2017-01-31 ENCOUNTER — Encounter: Payer: Self-pay | Admitting: Pediatrics

## 2017-01-31 DIAGNOSIS — H109 Unspecified conjunctivitis: Secondary | ICD-10-CM | POA: Insufficient documentation

## 2017-01-31 NOTE — Patient Instructions (Signed)

## 2017-02-13 ENCOUNTER — Other Ambulatory Visit: Payer: Self-pay | Admitting: Family

## 2017-02-13 ENCOUNTER — Other Ambulatory Visit (INDEPENDENT_AMBULATORY_CARE_PROVIDER_SITE_OTHER): Payer: Self-pay | Admitting: Pediatrics

## 2017-02-13 DIAGNOSIS — G40209 Localization-related (focal) (partial) symptomatic epilepsy and epileptic syndromes with complex partial seizures, not intractable, without status epilepticus: Secondary | ICD-10-CM

## 2017-02-13 DIAGNOSIS — G40319 Generalized idiopathic epilepsy and epileptic syndromes, intractable, without status epilepticus: Secondary | ICD-10-CM

## 2017-02-27 ENCOUNTER — Telehealth: Payer: Self-pay | Admitting: Pediatrics

## 2017-02-27 NOTE — Telephone Encounter (Signed)
SCAT papers on your desk to fill out please

## 2017-02-28 NOTE — Telephone Encounter (Signed)
SCAT Form for filled out

## 2017-03-18 ENCOUNTER — Other Ambulatory Visit: Payer: Self-pay | Admitting: Pediatrics

## 2017-03-18 ENCOUNTER — Other Ambulatory Visit (INDEPENDENT_AMBULATORY_CARE_PROVIDER_SITE_OTHER): Payer: Self-pay | Admitting: Pediatrics

## 2017-03-18 DIAGNOSIS — G40319 Generalized idiopathic epilepsy and epileptic syndromes, intractable, without status epilepticus: Secondary | ICD-10-CM

## 2017-03-18 DIAGNOSIS — G40209 Localization-related (focal) (partial) symptomatic epilepsy and epileptic syndromes with complex partial seizures, not intractable, without status epilepticus: Secondary | ICD-10-CM

## 2017-03-29 ENCOUNTER — Encounter (INDEPENDENT_AMBULATORY_CARE_PROVIDER_SITE_OTHER): Payer: Self-pay | Admitting: Pediatrics

## 2017-03-29 ENCOUNTER — Ambulatory Visit (INDEPENDENT_AMBULATORY_CARE_PROVIDER_SITE_OTHER): Payer: Medicaid Other | Admitting: Pediatrics

## 2017-03-29 VITALS — BP 110/70 | HR 72 | Wt 182.0 lb

## 2017-03-29 DIAGNOSIS — G808 Other cerebral palsy: Secondary | ICD-10-CM | POA: Diagnosis not present

## 2017-03-29 DIAGNOSIS — G40209 Localization-related (focal) (partial) symptomatic epilepsy and epileptic syndromes with complex partial seizures, not intractable, without status epilepticus: Secondary | ICD-10-CM

## 2017-03-29 DIAGNOSIS — G40901 Epilepsy, unspecified, not intractable, with status epilepticus: Secondary | ICD-10-CM | POA: Diagnosis not present

## 2017-03-29 DIAGNOSIS — F840 Autistic disorder: Secondary | ICD-10-CM | POA: Diagnosis not present

## 2017-03-29 DIAGNOSIS — F71 Moderate intellectual disabilities: Secondary | ICD-10-CM | POA: Diagnosis not present

## 2017-03-29 DIAGNOSIS — G4731 Primary central sleep apnea: Secondary | ICD-10-CM | POA: Diagnosis not present

## 2017-03-29 MED ORDER — VIMPAT 200 MG PO TABS: 400.0000 mg | ORAL_TABLET | Freq: Every day | ORAL | 5 refills | Status: DC

## 2017-03-29 MED ORDER — MIDAZOLAM 5 MG/ML PEDIATRIC INJ FOR INTRANASAL/SUBLINGUAL USE: 10.0000 mg | Freq: Once | INTRAMUSCULAR | 0 refills | Status: DC

## 2017-03-29 MED ORDER — KEPPRA 1000 MG PO TABS: ORAL_TABLET | ORAL | 5 refills | Status: DC

## 2017-03-29 MED ORDER — OXTELLAR XR 600 MG PO TB24: 3.0000 | ORAL_TABLET | Freq: Every day | ORAL | 5 refills | Status: DC

## 2017-03-29 MED ORDER — VIMPAT 100 MG PO TABS: ORAL_TABLET | ORAL | 5 refills | Status: DC

## 2017-03-29 NOTE — Progress Notes (Signed)
Patient: Zachary [name] MRN: [id number] Sex: male DOB: 04-10-1991  Provider: Ellison Carwin, MD Location of Care: [facility]  Note type: Routine return visit  History of Present Illness: Referral Source: Zachary Hahn, MD History from: mother, patient and Zachary [name] chart Chief Complaint: VNS/Epilepsy  Zachary [name] is a 26 y.o. male who returns on March 29, 2017, for the first time since September 28, 2016.  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  He has done extremely well since his last visit about 6 months ago.  There have only been 2 mild seizures at that time.  This good control has come about as a result of 3 antiepileptic drugs plus vagal nerve stimulator.  I think that the latter was more important in controlling his seizures in that occasion.    This comes Zachary he had 7 seizures between September 16, 2016, and September 28, 2016.  The reason for the increased frequency is as mysterious as the reason for decreased frequency.  He has autism spectrum disorder characterized by significant problems of electrical disability, limited receptive language and very poor expressive language, and attendant problems with socialization  Other medications include treatment for central hypertension, a variety of skin conditions, allergic rhinitis, and constipation.  His health has been good.  He has sleep apnea which has been treated with CPAP.  He keeps it on about 4 hours at night and during that time, he sleeps well.  Zachary [name] is able to walk about at home and is allowed to do so.  He has not had any significant falls.  He attends [name] between 9 a.m. and 2:30 p.m. and takes the bus home.  Either his mother cares for him or a Zachary [name] worker provides respite care.  He goes swimming with his mother at least once a week for about 30 to 45 minutes.  Usually, this is [employer] church on Sundays.  I mentioned this, because he  has gained 6 pounds.  It is very difficult to control Zachary [name]'s intake and also difficult for him to have increased physical activity.  I am pleased that his mother allows him to go into the garage to get into the car when they are getting ready to go out.  He needs some help buckling himself in.  He finger feeds.  He drinks from an open cup.  He is assisted with dressing.  He needs help with hygiene.  He is always an escape risk and for that reason, every door in the house has an alarm on it that notifies his mother if he has opened a door.  I interrogated his vagal nerve stimulator and the results are as noted below.  Procedure: interrogation of vagal nerve stimulator  The implanted device is series 102, serial [id number], implanted May 23, 2011.  On interrogation: amplitude 2.50 mA, frequency 20 Hz, pulse width 250 s, stimulation duration 7 seconds, stimulation interval 0.8 minutes, magnet amplitude 2.75 mA, magnet stimulation duration 60 seconds, magnet pulse width 250 s.  Number of stimuli since last visit is 37; total number since implantation: 711.  Systemdiagnostics with an amplitude of 1.71milliamps again showed intact communication, output, and impedance. DC/ DC conversion code was 1. There is no indication to change the battery.  Review of Systems: A complete review of systems was remarkable for two small seizures since last visit, all other systems reviewed and negative.  Past Medical History Diagnosis Date  . 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 Theressa StampsJames [name]. Patient's AHI was 39.6 RDI is 43.6 positional component. Patient will need desensitization and a gentle approach to CPAP titration.   . CP (cerebral palsy) (HCC)   . Fracture of femur, intertrochanteric, closed (HCC)   . Hypertension   . MR (mental retardation)   . Recurrent apnea   . Seizures (HCC)   . 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 Marland Kitchen.    Hospitalizations: No., Head Injury: No., Nervous System Infections: No., Immunizations up to date: Yes.    I began to see him on July 10, 2004. He apparently was seen by me when he was eight months of age with developmental delay. Seizure activity; however, did not began until he is 14, two days Zachary starting amitriptyline. 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 His most recent CT scan of the brain was on September 29, 2009, and showed no significant changes.  EEG showed diffuse background slowing.   He was treated with Depakote, Topamax, and Lyrica, but suffered side effects and agitation from those medications. He has taken Trileptal, Keppra, and Vimpat for quite some time. [facility] Oct [facility], 2010 for 6 days.for a phase 1 evaluation. MRI of the brain showed ex-vacuo dilatation of his ventricles and cortical atrophy. Volume loss most pronounced in the parietal lobes bilaterally, temporal lobes with widening of the sylvian fissures and a paucity of white matter diffusely most pronounced again in the parietal lobes with patchy T2 hyperintensity. 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 There was no evidence of mesial temporal sclerosis.  Magnetoencephalogram showed epileptiform activity arising from the left posterior temple region with a field extending to the occipital lobe. This was not seen with concurrent routine EEG.  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 This was somewhat worse in the right than the left bilateral temporal cortex and temporal pole suggesting a  longstanding insult.  Prolonged video EEG showed onset of bifrontal slowing that evolved into rhythmic activity with spikes of greater amplitude over the right central region with secondary generalization. Clinically, the patient has onset of a myoclonic jerk with his head turned to the left, movement of the left arm and leg, then tonic posturing followed by tonic-clonic generalized movements.  One day later he had bifrontal rhythmic beta range activity followed by decrement in voltage with fast beta range activity for about 30 seconds and then generalized rhythmic beta range activity. The patient had a slight head bob slowly lowered to the bed with stiffening of both upper extremities and tonic-clonic movement of both upper extremities followed by secondary generalization in his legs. A total of seven seizures were monitored. The last three were obscured by electrode artifact. Seizure onset appeared to occur from both frontal regions. This led to the recommendations to place the vagal nerve stimulator.  .  He has frequent ear infections, urinary tract infections, constipation and a hip fracture. He has had problems with neutropenia and thrombocytopenia. He had some vomiting in June, 2010 and developed aspiration pneumonia. He tends to have a  temperature of 99 in the late evening. He has had several episodes in which his left knee has been dislocated. This happened as recently as August 28, 2009. He sat down in a chair and his knee went out of place. EMS was called and he was taken to the ER. While waiting for the ER physician, Zachary [name] stretched and his knee went back in to place. His mother said that he was subsequently seen by his orthopedist, who thinks that he may eventually need surgery on his knee.   Behavior History none  Surgical History Procedure Laterality Date  . FOOT SURGERY    . FRACTURE SURGERY    . HIP SURGERY    . IMPLANTATION VAGAL NERVE STIMULATOR    . Pediatomy  2009  .  TONSILLECTOMY  2009  . TYMPANOSTOMY TUBE PLACEMENT  2009   Family History family history includes Cancer in his paternal grandmother; Diabetes in his paternal grandfather. Family history is negative for migraines, seizures, intellectual disabilities, blindness, deafness, birth defects, chromosomal disorder, or autism.  Social History . Marital status: Single  . Years of education: 53   Social History Main Topics  . Smoking status: Never Smoker  . Smokeless tobacco: Never Used  . Alcohol use No  . Drug use: No  . 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     He enjoys swimming and going to the beach.   Allergies Allergen Reactions  . Depakote [Divalproex Sodium] Other (See Comments)    Low Platelets  . Lyrica [Pregabalin] Other (See Comments)    Sleepiness  . Topamax [Topiramate] Other (See Comments)    Weight Loss   Physical Exam BP 110/70   Pulse 72   Wt 182 lb (82.6 kg)   BMI 34.96 kg/m   General: alert, well developed, obese, in no acute distress, black hair, brown eyes, right handed Head: normocephalic, no dysmorphic features Ears, Nose and Throat: Otoscopic: tympanic membranes normal; pharynx: oropharynx is pink without exudates or tonsillar hypertrophy Neck: supple, full range of motion, no cranial or cervical bruits Respiratory: auscultation clear Cardiovascular: no murmurs, pulses are normal Musculoskeletal: no skeletal deformities or apparent scoliosis Skin: no rashes or neurocutaneous lesions  Neurologic Exam  Mental Status: alert; makes intermittent eye contact, does not speak, resists efforts to examine him, unable to follow anything but simple commands Cranial Nerves: visual fields are full to double simultaneous stimuli; extraocular movements are full and dysconjugate; bilateral exotropia without nystagmus, amblyopia in his right eye pupils are round, reactive to light;  funduscopic examination shows positive red reflexes bilaterally; symmetric facial strength, impassive face; midline tongue; localizes sound bilaterally Motor: Spastic left hemiparesis with flexion of his left arm and left claw hand deformity, clumsy fine motor movements and mild weakness in the right arm, triplegic legs Sensory: intact responses to cold, vibration, proprioception and stereognosis Coordination: good finger-to-nose, rapid repetitive alternating movements and finger apposition Gait and Station: diplegic slightly broad-based gait Reflexes: symmetric and diminished bilaterally; no clonus; bilateral flexor plantar responses  Assessment 1. 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 2. 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 3. Complex sleep apnea syndrome, G47.31. 4. 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 5. 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 6. Status epilepticus, G40.901.  Discussion Fortunately, Daevon has not experienced status epilepticus in quite some time.  I am very pleased with his  seizure control.    His other problems appear to be stable at this time.  His mother continues to try to Venne son independence.  Plan I refilled his prescriptions for Vimpat, both 100 and 200 mg, Oxtellar, and Keppra.  These are all brand name medically necessary.  I also refilled his prescription for Midazolam because the expiration date on the vials had passed.  There is no reason to make any changes in his medication or in his vagal nerve stimulator.  However, I interrogated to make sure that it was functioning well and it is.  I spent 30 minutes of face-to-face time with Zachary [name] and his mother, more than half of it discussing his many health issues including his apnea.  I do not think there is anything that we can do to keep the CPAP in place, but he seems to be fairly well  rested.  His quadriplegia is stable and there is nothing that needs to be done for it.  His autism and intellectual disability are also stable at this time.  I am pleased that he is getting physical activity but remain concerned about his weight gain.  He will return in 6 months for routine visit.  I will see him sooner based on clinical need.   Medication List   Accurate as of 03/29/17 11:59 PM.      albuterol (2.5 MG/3ML) 0.083% nebulizer solution Commonly known as:  PROVENTIL Take 3 mLs (2.5 mg total) by nebulization every 6 (six) hours as needed for wheezing or shortness of breath.   cetirizine 10 MG tablet Commonly known as:  ZYRTEC Take 1 tablet (10 mg total) by mouth daily as needed for allergies.   DIASTAT ACUDIAL 20 MG Gel Generic drug:  diazepam Give 15 mg rectally Zachary 2 minutes of seizure.   docusate sodium 100 MG capsule Commonly known as:  COLACE Take 200 mg by mouth daily.   fluticasone 50 MCG/ACT nasal spray Commonly known as:  FLONASE Place 1 spray into both nostrils daily.   KEPPRA 1000 MG tablet Generic drug:  levETIRAcetam TAKE ONE AND ONE-HALF TABLET TWICE DAILY   ketoconazole 2 % cream Commonly known as:  NIZORAL Apply 1 application topically daily.   LORazepam 1 MG tablet Commonly known as:  ATIVAN Take 1 tablet by mouth as needed for agitation.   midazolam 5 MG/ML injection Commonly known as:  VERSED Place 2 mLs (10 mg total) into the nose once. Draw up 1ml in 2 syringes. Remove blue vial access device. Attach syringe to nasal atomizer for intranasal administration. Give 1ml in each nostril for seizures lasting 2 minutes or longer.   mupirocin ointment 2 % Commonly known as:  BACTROBAN Apply 1 application topically 2 (two) times daily. To affected skin.   OXTELLAR XR 600 MG Tb24 Generic drug:  OXcarbazepine ER Take 3 tablets by mouth at bedtime.   promethazine 25 MG suppository Commonly known as:  PHENERGAN Place 1 suppository (25 mg total)  rectally every 6 (six) hours as needed for nausea or vomiting.   Spironolactone 25 MG/5ML Susp Take by mouth daily.   VIMPAT 200 MG Tabs tablet Generic drug:  lacosamide Take 2 tablets (400 mg total) by mouth at bedtime.   VIMPAT 100 MG Tabs Generic drug:  Lacosamide TAKE ONE TABLET IN THE MORNING AND TAKE ONE TABLET AT 3PM    The medication list was reviewed and reconciled. All changes or newly prescribed medications were explained.  A complete medication list was provided to  the patient/caregiver.  Jodi Geralds MD

## 2017-04-04 ENCOUNTER — Encounter: Payer: Self-pay | Admitting: Pediatrics

## 2017-04-04 ENCOUNTER — Ambulatory Visit (INDEPENDENT_AMBULATORY_CARE_PROVIDER_SITE_OTHER): Payer: Medicaid Other | Admitting: Pediatrics

## 2017-04-04 VITALS — BP 110/70 | Wt 184.0 lb

## 2017-04-04 DIAGNOSIS — Z23 Encounter for immunization: Secondary | ICD-10-CM | POA: Diagnosis not present

## 2017-04-04 DIAGNOSIS — F840 Autistic disorder: Secondary | ICD-10-CM

## 2017-04-04 DIAGNOSIS — G808 Other cerebral palsy: Secondary | ICD-10-CM

## 2017-04-04 DIAGNOSIS — Z0001 Encounter for general adult medical examination with abnormal findings: Secondary | ICD-10-CM | POA: Diagnosis not present

## 2017-04-04 MED ORDER — POLYETHYLENE GLYCOL 3350 17 GM/SCOOP PO POWD: 1.0000 | Freq: Once | ORAL | 12 refills | Status: AC

## 2017-04-04 MED ORDER — ERYTHROMYCIN 5 MG/GM OP OINT: 1.0000 "application " | TOPICAL_OINTMENT | Freq: Three times a day (TID) | OPHTHALMIC | 6 refills | Status: AC

## 2017-04-04 NOTE — Patient Instructions (Signed)
Autism Spectrum Disorder, Adult Autism spectrum disorder (ASD) is a group of developmental disorders that affect communication, social interactions, and behavior. The disorders start in early childhood and continue throughout life. ASD affects each person differently. Some people with ASD have above-average intelligence. Others have severe intellectual disabilities. Some people can do most basic activities or learn to do them. Others require a lot of assistance. What are the causes? The exact cause of this condition is not known. Most experts believe that ASD is caused by genes that are passed down through families. What increases the risk? This condition is more likely to develop in people who:  Are male.  Have a family history of the condition.  Were born before 26 weeks of pregnancy (prematurely).  Were born with another genetic disorder.  Were conceived when their parents were older than 4135-27 years of age.  Were exposed to a seizure medicine called valproic acid while in their mother's womb.  What are the signs or symptoms? Symptoms of this condition include:  Not interacting with other people.  Poor eye contact.  Inappropriate facial expressions.  Trouble making friends.  Repetitive movements, such as hand flapping, rocking back and forth, or head movements.  Arranging items in an order.  Echoing what other people say (echolalia).  Always wanting things to be the same. You may want to eat the same foods, take the same route to school or work, or follow the same order of activities each day.  Being completely focused on an object or topic of interest.  Unusually strong or mild response when experiencing certain things, such as sounds, pain, extreme temperatures, certain textures, or scents.  Some people with ASD also have learning problems, depression, anxiety, or seizures. How is this diagnosed? This condition is diagnosed with a comprehensive assessment. You may  need to see a team of health care providers, which may include:  A psychologist or psychiatrist.  A speech and language therapist.  A neurologist.  Your health care providers will assess your behavior and development. They will determine whether you have level 1, level 2, or level 3 ASD. Level 1 ASD is the mildest form of the condition. With treatment, this form may not be noticeable. If you have this form, you may:  Speak in full sentences.  Have no repetitive behaviors.  Have trouble starting interactions or friendships with others.  Have trouble switching between two or more activities.  Level 2 ASD is a moderate form of the condition. If you have this form, you may:  Speak in simple sentences.  Repeat certain behaviors, which interferes with daily activities from time to time.  Only interact with others about specific, shared interests.  Have trouble coping with change.  Have unusual nonverbal communication skills.  Level 3 ASD is the most severe form of the condition. This form interferes with daily life. If you have this form, you may:  Speak rarely or use very few understandable words.  Repeat certain behaviors often, which gets in the way of daily activities.  Interact with others awkwardly and not very often.  Have extreme difficulty coping with change.  How is this treated? There is no cure for this condition, but treatment can make symptoms less severe. A team of health care providers will design a treatment program to meet your needs. Treatment usually involves a combination of therapies that address the following:  Social skills.  Language and communication.  Behavior.  Skills for daily living.  Movement and coordination.  Sometimes medicines are prescribed to treat depression and anxiety, seizures, or certain behavioral problems. Training and support for your family can also be part of your treatment program. Follow these instructions at  home:  Learn as much as you can about ASD. Make sure you understand your condition.  Work closely with your health care providers.  Take over-the-counter and prescription medicines only as told by your health care provider.  Check with your health care provider before taking any new medicines.  Keep all follow-up visits as told by your team of health care providers. This is important. Contact a health care provider if:  You have new symptoms.  Your symptoms get worse or do not respond to treatment.  You are behaving in ways that harm you or others.  You develop convulsions. Signs of convulsions include: ? Jerking and twitching. ? Sudden falls for no reason. ? Lack of response. ? Dazed behavior for brief periods. ? Staring. ? Rapid blinking. ? Unusual sleepiness. ? Irritability when waking.  You become depressed. Signs of depression include: ? Unusual sadness. ? Decreased appetite. ? Weight loss. ? Lack of interest in things that are normally enjoyed. ? Trouble sleeping.  You become anxious. Signs of anxiety include: ? Worrying a lot. ? Restlessness. ? Irritability. ? Trembling. ? Trouble sleeping. This information is not intended to replace advice given to you by your health care provider. Make sure you discuss any questions you have with your health care provider. Document Released: 02/10/2002 Document Revised: 01/18/2016 Document Reviewed: 12/26/2015 Elsevier Interactive Patient Education  [employer]2018 Elsevier Inc.

## 2017-04-04 NOTE — Progress Notes (Signed)
Subjective:     Rexene AlbertsJohn T Klenke is a 26 y.o. male and is here for a comprehensive physical exam. The patient reports follow up for seizures, CP, MR and autism.  Social History   Social History  . Marital status: Single    Spouse name: N/A  . Number of children: N/A  . Years of education: N/A   Occupational History  . Not on file.   Social History Main Topics  . Smoking status: Never Smoker  . Smokeless tobacco: Never Used  . Alcohol use No  . Drug use: No  . Sexual activity: No   Other Topics Concern  . 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    He enjoys swimming and going to the beach.   Health Maintenance  Topic Date Due  . HIV Screening  07/21/2004  . INFLUENZA VACCINE  01/02/2017  . TETANUS/TDAP  11/18/2017    The following portions of the patient's history were reviewed and updated as appropriate: allergies, current medications, past family history, past medical history, past social history, past surgical history and problem list.  Review of Systems Pertinent items are noted in HPI.   Objective:    BP 110/70   Wt 184 lb (83.5 kg)   BMI 35.34 kg/m  General appearance: no distress--non verbal, non cooperative Eyes: negative Ears: normal TM's and external ear canals both ears Nose: Nares normal. Septum midline. Mucosa normal. No drainage or sinus tenderness. Back: negative Lungs: clear to auscultation bilaterally Heart: normal apical impulse Abdomen: soft, non-tender; bowel sounds normal; no masses,  no organomegaly Extremities: spastic quadriplegia Skin: Skin color, texture, turgor normal. No rashes or lesions Neurologic: baseline developmental delay with wheelchair bound   Assessment:   Autism with developmental delay for routine check up   Plan:   Medications refilled Equipment discussed and need for continued: Wheelchair Modified bath tub Oxygen-tank plus portable Nebulizer    See After Visit Summary for Counseling Recommendations

## 2017-04-17 ENCOUNTER — Other Ambulatory Visit: Payer: Self-pay | Admitting: Family

## 2017-04-17 DIAGNOSIS — G40209 Localization-related (focal) (partial) symptomatic epilepsy and epileptic syndromes with complex partial seizures, not intractable, without status epilepticus: Secondary | ICD-10-CM

## 2017-04-29 ENCOUNTER — Other Ambulatory Visit: Payer: Self-pay | Admitting: Family

## 2017-05-14 ENCOUNTER — Other Ambulatory Visit (INDEPENDENT_AMBULATORY_CARE_PROVIDER_SITE_OTHER): Payer: Self-pay | Admitting: Pediatrics

## 2017-05-14 DIAGNOSIS — G40209 Localization-related (focal) (partial) symptomatic epilepsy and epileptic syndromes with complex partial seizures, not intractable, without status epilepticus: Secondary | ICD-10-CM

## 2017-06-19 ENCOUNTER — Encounter: Payer: Self-pay | Admitting: Pediatrics

## 2017-07-03 ENCOUNTER — Encounter: Payer: Self-pay | Admitting: Pediatrics

## 2017-07-15 ENCOUNTER — Ambulatory Visit
Admission: RE | Admit: 2017-07-15 | Discharge: 2017-07-15 | Disposition: A | Discharge status: Home / Self Care | Payer: Medicaid Other | Source: Ambulatory Visit | Attending: Pediatrics | Admitting: Pediatrics

## 2017-07-15 ENCOUNTER — Encounter: Payer: Self-pay | Admitting: Pediatrics

## 2017-07-15 ENCOUNTER — Ambulatory Visit (INDEPENDENT_AMBULATORY_CARE_PROVIDER_SITE_OTHER): Payer: Medicaid Other | Admitting: Pediatrics

## 2017-07-15 VITALS — Wt 186.0 lb

## 2017-07-15 DIAGNOSIS — J329 Chronic sinusitis, unspecified: Secondary | ICD-10-CM

## 2017-07-15 DIAGNOSIS — R059 Cough, unspecified: Secondary | ICD-10-CM | POA: Insufficient documentation

## 2017-07-15 DIAGNOSIS — R05 Cough: Secondary | ICD-10-CM

## 2017-07-15 DIAGNOSIS — B9689 Other specified bacterial agents as the cause of diseases classified elsewhere: Secondary | ICD-10-CM

## 2017-07-15 LAB — POCT INFLUENZA A: RAPID INFLUENZA A AGN: NEGATIVE

## 2017-07-15 LAB — POCT INFLUENZA B: Rapid Influenza B Ag: NEGATIVE

## 2017-07-15 MED ORDER — AMOXICILLIN-POT CLAVULANATE 500-125 MG PO TABS: 1.0000 | ORAL_TABLET | Freq: Three times a day (TID) | ORAL | 0 refills | Status: DC

## 2017-07-15 MED ORDER — ALBUTEROL SULFATE (2.5 MG/3ML) 0.083% IN NEBU: 2.5000 mg | INHALATION_SOLUTION | Freq: Four times a day (QID) | RESPIRATORY_TRACT | 12 refills | Status: DC | PRN

## 2017-07-15 NOTE — Patient Instructions (Signed)

## 2017-07-15 NOTE — Progress Notes (Signed)
27 year old male with cerebral palsy who presents  with nasal congestion, cough and nasal discharge off and on for the past two weeks. Mom says she is also having fever X 2 days and now has thick green mucoid nasal discharge. Cough is keeping her up at night and he has decreased appetite.    Some post tussive vomiting but no diarrhea, no rash and no wheezing. Symptoms are persistent (>10 days), Severe (affecting sleep and feeding) and Severe (associated fever).    Review of Systems  Constitutional:  Negative for chills, activity change and appetite change.  HENT:  Negative for  trouble swallowing, voice change and ear discharge.   Eyes: Negative for discharge, redness and itching.  Respiratory:  Negative for  wheezing.   Cardiovascular: Negative for chest pain.  Gastrointestinal: Negative for vomiting and diarrhea.  Musculoskeletal: Negative for arthralgias.  Skin: Negative for rash.  Neurological: Negative for weakness.       Objective:   Physical Exam  Constitutional: Appears well-developed and well-nourished.   HENT:  Ears: Both TM's normal Nose: Profuse purulent nasal discharge.  Mouth/Throat: Mucous membranes are moist. No dental caries. No tonsillar exudate. Pharynx is normal..  Eyes: Pupils are equal, round, and reactive to light.  Neck: Normal range of motion.  Cardiovascular: Regular rhythm.  No murmur heard. Pulmonary/Chest: Effort normal and breath sounds normal. No nasal flaring. No respiratory distress. No wheezes with  no retractions.  Abdominal: Soft. Bowel sounds are normal. No distension and no tenderness.  Musculoskeletal: Normal range of motion.  Neurological: Active and alert.  Skin: Skin is warm and moist. No rash noted.   Chest X ray--negative  Flu A and B negative     Assessment:      Sinusitis--bacterial  Plan:     Will treat with oral antibiotics and follow as needed

## 2017-07-29 ENCOUNTER — Encounter: Payer: Self-pay | Admitting: Pediatrics

## 2017-07-29 ENCOUNTER — Ambulatory Visit: Payer: Medicaid Other | Admitting: Pediatrics

## 2017-07-29 VITALS — BP 130/84 | Wt 187.0 lb

## 2017-07-29 DIAGNOSIS — H6691 Otitis media, unspecified, right ear: Secondary | ICD-10-CM | POA: Diagnosis not present

## 2017-07-29 MED ORDER — SULFAMETHOXAZOLE-TRIMETHOPRIM 800-160 MG PO TABS: 1.0000 | ORAL_TABLET | Freq: Two times a day (BID) | ORAL | 0 refills | Status: AC

## 2017-07-29 NOTE — Progress Notes (Signed)
  Subjective   Zachary [name], 27 y.o. male with cerebral palsy/autism/ presents with slapping his ears and increased seizures.  The patient's history has been marked as reviewed and updated as appropriate.  Objective   BP 130/84   Wt 187 lb (84.8 kg)   BMI 35.92 kg/m   General appearance:  well developed and well nourished and well hydrated  Nasal: Neck:  Mild nasal congestion with clear rhinorrhea Neck is supple  Ears:  External ears are normal Right TM - erythematous, dull and bulging Left TM - erythematous  Oropharynx:  Mucous membranes are moist; there is mild erythema of the posterior pharynx  Lungs:  Lungs are clear to auscultation  Heart:  Regular rate and rhythm; no murmurs or rubs  Skin:  No rashes or lesions noted   Assessment   Acute right otitis media  Plan   1) Antibiotics per orders 2) Fluids, acetaminophen as needed 3) Recheck if symptoms persist for 2 or more days, symptoms worsen, or new symptoms develop.

## 2017-07-29 NOTE — Patient Instructions (Signed)
Otitis Media, Adult Otitis media is redness, soreness, and puffiness (swelling) in the space just behind your eardrum (middle ear). It may be caused by allergies or infection. It often happens along with a cold. Follow these instructions at home:  Take your medicine as told. Finish it even if you start to feel better.  Only take over-the-counter or prescription medicines for pain, discomfort, or fever as told by your doctor.  Follow up with your doctor as told. Contact a doctor if:  You have otitis media only in one ear, or bleeding from your nose, or both.  You notice a lump on your neck.  You are not getting better in 3-5 days.  You feel worse instead of better. Get help right away if:  You have pain that is not helped with medicine.  You have puffiness, redness, or pain around your ear.  You get a stiff neck.  You cannot move part of your face (paralysis).  You notice that the bone behind your ear hurts when you touch it. This information is not intended to replace advice given to you by your health care provider. Make sure you discuss any questions you have with your health care provider. Document Released: 11/07/2007 Document Revised: 10/27/2015 Document Reviewed: 12/16/2012 Elsevier Interactive Patient Education  2017 [employer]vier Inc.  

## 2017-07-30 ENCOUNTER — Encounter: Payer: Self-pay | Admitting: Pediatrics

## 2017-07-31 ENCOUNTER — Other Ambulatory Visit: Payer: Self-pay

## 2017-07-31 ENCOUNTER — Emergency Department (HOSPITAL_COMMUNITY)
Admission: EM | Admit: 2017-07-31 | Discharge: 2017-07-31 | Disposition: A | Discharge status: Home / Self Care | Payer: Medicaid Other | Attending: Emergency Medicine | Admitting: Emergency Medicine

## 2017-07-31 ENCOUNTER — Telehealth (INDEPENDENT_AMBULATORY_CARE_PROVIDER_SITE_OTHER): Payer: Self-pay | Admitting: Pediatrics

## 2017-07-31 ENCOUNTER — Emergency Department (HOSPITAL_COMMUNITY): Payer: Medicaid Other

## 2017-07-31 DIAGNOSIS — I10 Essential (primary) hypertension: Secondary | ICD-10-CM | POA: Diagnosis not present

## 2017-07-31 DIAGNOSIS — F840 Autistic disorder: Secondary | ICD-10-CM | POA: Diagnosis not present

## 2017-07-31 DIAGNOSIS — Z79899 Other long term (current) drug therapy: Secondary | ICD-10-CM | POA: Insufficient documentation

## 2017-07-31 DIAGNOSIS — F79 Unspecified intellectual disabilities: Secondary | ICD-10-CM | POA: Insufficient documentation

## 2017-07-31 DIAGNOSIS — G40909 Epilepsy, unspecified, not intractable, without status epilepticus: Secondary | ICD-10-CM | POA: Diagnosis not present

## 2017-07-31 DIAGNOSIS — R112 Nausea with vomiting, unspecified: Secondary | ICD-10-CM | POA: Insufficient documentation

## 2017-07-31 DIAGNOSIS — R569 Unspecified convulsions: Secondary | ICD-10-CM | POA: Diagnosis present

## 2017-07-31 LAB — COMPREHENSIVE METABOLIC PANEL
ALBUMIN: 4.1 g/dL (ref 3.5–5.0)
ALK PHOS: 54 U/L (ref 38–126)
ALT: 29 U/L (ref 17–63)
ANION GAP: 12 (ref 5–15)
AST: 20 U/L (ref 15–41)
BUN: 18 mg/dL (ref 6–20)
CHLORIDE: 99 mmol/L — AB (ref 101–111)
CO2: 28 mmol/L (ref 22–32)
CREATININE: 0.93 mg/dL (ref 0.61–1.24)
Calcium: 9.3 mg/dL (ref 8.9–10.3)
GFR calc non Af Amer: >60 mL/min (ref >60)
GLUCOSE: 109 mg/dL — AB (ref 65–99)
Potassium: 3.1 mmol/L — ABNORMAL LOW (ref 3.5–5.1)
SODIUM: 139 mmol/L (ref 135–145)
Total Bilirubin: 0.5 mg/dL (ref 0.3–1.2)
Total Protein: 7.5 g/dL (ref 6.5–8.1)

## 2017-07-31 LAB — CBC WITH DIFFERENTIAL/PLATELET
BASOS PCT: 0 %
Basophils Absolute: 0 10*3/uL (ref 0.0–0.1)
EOS ABS: 0 10*3/uL (ref 0.0–0.7)
EOS PCT: 0 %
HCT: 46.8 % (ref 39.0–52.0)
HEMOGLOBIN: 16.3 g/dL (ref 13.0–17.0)
LYMPHS ABS: 0.9 10*3/uL (ref 0.7–4.0)
Lymphocytes Relative: 10 %
MCH: 29 pg (ref 26.0–34.0)
MCHC: 34.8 g/dL (ref 30.0–36.0)
MCV: 83.3 fL (ref 78.0–100.0)
Monocytes Absolute: 0.8 10*3/uL (ref 0.1–1.0)
Monocytes Relative: 8 %
NEUTROS PCT: 82 %
Neutro Abs: 7.6 10*3/uL (ref 1.7–7.7)
PLATELETS: 243 10*3/uL (ref 150–400)
RBC: 5.62 MIL/uL (ref 4.22–5.81)
RDW: 13.1 % (ref 11.5–15.5)
WBC: 9.3 10*3/uL (ref 4.0–10.5)

## 2017-07-31 LAB — URINALYSIS, ROUTINE W REFLEX MICROSCOPIC
BACTERIA UA: NONE SEEN
Bilirubin Urine: NEGATIVE
GLUCOSE, UA: NEGATIVE mg/dL
HGB URINE DIPSTICK: NEGATIVE
KETONES UR: 5 mg/dL — AB
LEUKOCYTES UA: NEGATIVE
NITRITE: NEGATIVE
PROTEIN: NEGATIVE mg/dL
SQUAMOUS EPITHELIAL / LPF: NONE SEEN
Specific Gravity, Urine: 1.021 (ref 1.005–1.030)
pH: 6 (ref 5.0–8.0)

## 2017-07-31 LAB — CBG MONITORING, ED: Glucose-Capillary: 127 mg/dL — ABNORMAL HIGH (ref 65–99)

## 2017-07-31 MED ORDER — LORAZEPAM 2 MG/ML IJ SOLN
1.0000 mg | Freq: Once | INTRAMUSCULAR | Status: AC
  Administered 2017-07-31: 1 mg via INTRAVENOUS
  Filled 2017-07-31: qty 1

## 2017-07-31 MED ORDER — ONDANSETRON HCL 4 MG/2ML IJ SOLN
4.0000 mg | Freq: Once | INTRAMUSCULAR | Status: AC
Start: 2017-07-31 — End: 2017-07-31
  Administered 2017-07-31: 4 mg via INTRAVENOUS
  Filled 2017-07-31: qty 2

## 2017-07-31 MED ORDER — SODIUM CHLORIDE 0.9 % IV BOLUS (SEPSIS)
1000.0000 mL | Freq: Once | INTRAVENOUS | Status: AC
  Administered 2017-07-31: 1000 mL via INTRAVENOUS

## 2017-07-31 NOTE — ED Triage Notes (Addendum)
Pt to ED via [facility] with complaints of two seizures today from not keeping medications down. Pt has been N/V for the last 4 days. EMS tempt 99.8 and Pt has ear infection that is being treated with Bactrim. Pt is nonverbal as his norm,  138/96 Pulse 96 96 CBG 97% RA

## 2017-07-31 NOTE — ED Provider Notes (Signed)
[facility]-EMERGENCY DEPT Provider Note   CSN: [id number] Arrival date & time: 07/31/17  1858     History   Chief Complaint Chief Complaint  Patient presents with  . Seizures    HPI Zachary [name] is a 27 y.o. male.  Level 5 caveat for autism, cerebral palsy, mental retardation.  History obtained from mother.  Patient lives at home with his mother.  He has multiple complex medical problems well documented in the past medical history.  He has had nausea and vomiting for 4 days and he is dehydrated.  He has not been able to keep his seizure medications down and subsequently has had several seizures today.  He has had a recent ear infection and is presently on Bactrim for this.  He is normally nonverbal.  He has urinated once today.      Past Medical History:  Diagnosis Date  . 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 Theressa Stamps. Patient's AHI was 39.6 RDI is 43.6 positional component. Patient will need desensitization and a gentle approach to CPAP titration.   . CP (cerebral palsy) (HCC)   . Fracture of femur, intertrochanteric, closed (HCC)   . Hypertension   . MR (mental retardation)   . Recurrent apnea   . Seizures (HCC)   . 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 .     Patient Active Problem List   Diagnosis Date Noted  . Bacterial sinusitis 07/15/2017  . Cough 07/15/2017  . Bacterial conjunctivitis of left eye 01/31/2017  . Gastroenteritis 10/24/2016  . Tinea cruris 03/12/2016  . Acute otitis media of right ear in pediatric patient 10/13/2015  . Cerebral palsy, unspecified (HCC) 10/13/2015  . Otitis media with effusion 03/27/2015  . Encounter for well adult exam with abnormal findings 01/31/2015  . Need for prophylactic vaccination and inoculation against influenza 01/31/2015  . 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 Status post placement of VNS (vagus nerve stimulation) device 02/11/2014  . Medication monitoring encounter 02/11/2014  . 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 Rhinitis, allergic 12/30/2013  . 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 Well adult health check 04/28/2013  . Fluid retention in legs 01/10/2013  . 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 Generalized hyperhidrosis 10/23/2012  . 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 Hypertension 06/25/2011  . Constipation, chronic 12/27/2010  . Tonic-clonic seizures, intractable (HCC) 11/05/2010  . MRSA (methicillin resistant Staphylococcus aureus) carrier 11/05/2010    Past Surgical History:  Procedure Laterality Date  . FOOT SURGERY    . FRACTURE SURGERY    . HIP SURGERY    . IMPLANTATION VAGAL NERVE STIMULATOR    . Pediatomy  2009  . TONSILLECTOMY  2009  . TYMPANOSTOMY TUBE PLACEMENT  2009       Home Medications    Prior to Admission medications   Medication Sig Start Date End Date Taking? Authorizing Provider  acetaminophen (TYLENOL) 500 MG tablet Take 500 mg by mouth every 6 (six) hours as needed for mild pain, moderate pain, fever or headache.   Yes [provider]  albuterol (PROVENTIL) (2.5 MG/3ML) 0.083% nebulizer solution Take 3 mLs (2.5 mg  total) by nebulization every 6 (six) hours as needed for wheezing or shortness of breath. 07/15/17 08/15/17 Yes Ramgoolam, Emeline Gins, MD  cetirizine (ZYRTEC) 10 MG tablet Take 1 tablet (10 mg total) by mouth daily as needed for allergies. 03/12/16  Yes Ramgoolam, Emeline Gins, MD  chlorthalidone (HYGROTON) 25 MG tablet Take 25 mg by mouth 2 (two) times daily.   Yes [provider]  cholecalciferol (VITAMIN D) 1000 units tablet Take 1,000 Units by mouth daily.   Yes [provider]  DIASTAT ACUDIAL 20 MG GEL Give 15 mg rectally after 2 minutes of seizure. 04/21/14  Yes Deetta Perla, MD  docusate sodium (COLACE) 100 MG capsule Take 200 mg by mouth daily.   Yes [provider]  fluticasone (FLONASE) 50 MCG/ACT nasal spray Place 1 spray into both nostrils daily. Patient taking differently: Place 1 spray into both nostrils daily as needed for allergies.  04/27/13 07/31/17 Yes Faylene Kurtz, MD  KEPPRA 1000 MG tablet TAKE ONE AND ONE-HALF TABLET TWICE DAILY 04/17/17  Yes Hickling, Deanna Artis, MD  ketoconazole (NIZORAL) 2 % cream Apply 1 application topically daily. Patient taking differently: Apply 1 application topically daily as needed for irritation.  03/12/16  Yes Ramgoolam, Emeline Gins, MD  LORazepam (ATIVAN) 1 MG tablet Take 1 tablet by mouth as needed for agitation. 07/18/16  Yes Deetta Perla, MD  midazolam (VERSED) 5 MG/ML injection Place 2 mLs (10 mg total) into the nose once. Draw up 1ml in 2 syringes. Remove blue vial access device. Attach syringe to nasal atomizer for intranasal administration. Give 1ml in each nostril for seizures lasting 2 minutes or longer. 03/29/17 07/31/17 Yes Deetta Perla, MD  mupirocin ointment (BACTROBAN) 2 % Apply 1 application topically 2 (two) times daily. To affected skin. Patient taking differently: Apply 1 application topically 2 (two) times daily as needed (for skin). To affected skin. 03/26/15  Yes Ramgoolam, Emeline Gins, MD  neomycin-polymyxin-hydrocortisone (CORTISPORIN) 3.5-10000-1 OTIC suspension Place 1 drop into both ears 3 (three) times daily as needed (for ear pain).   Yes [provider]  OXTELLAR XR 600 MG TB24 TAKE 3 TABLETS AT BEDTIME 04/29/17  Yes Hickling, Deanna Artis, MD  polyethylene glycol (MIRALAX / GLYCOLAX) packet Take 17 g by mouth daily.   Yes [provider]    promethazine (PHENERGAN) 25 MG suppository Place 1 suppository (25 mg total) rectally every 6 (six) hours as needed for nausea or vomiting. 10/22/16 07/31/17 Yes Ramgoolam, Emeline Gins, MD  spironolactone (ALDACTONE) 25 MG tablet Take 25 mg by mouth at bedtime.   Yes [provider]  sulfamethoxazole-trimethoprim (BACTRIM DS) 800-160 MG tablet Take 1 tablet by mouth 2 (two) times daily for 14 days. Patient taking differently: Take 1 tablet by mouth 2 (two) times daily. Started 02/25 07/29/17 08/12/17 Yes [name], MD  VIMPAT 100 MG TABS TAKE ONE TABLET IN THE MORNING AND TAKE ONE TABLET AT 3PM 05/14/17  Yes Deetta Perla, MD  VIMPAT 200 MG TABS tablet TAKE 2 TABLETS AT BEDTIME 05/14/17  Yes Deetta Perla, MD  amoxicillin-clavulanate (AUGMENTIN) 500-125 MG tablet Take 1 tablet (500 mg total) by mouth 3 (three) times daily. Patient not taking: Reported on 07/31/2017 07/15/17   [name], MD    Family History Family History  Problem Relation Age of Onset  . Cancer Paternal Grandmother        Died in her 78's  . Diabetes Paternal Grandfather        Died in his 74's  Social History Social History   Tobacco Use  . Smoking status: Never Smoker  . Smokeless tobacco: Never Used  Substance Use Topics  . Alcohol use: No  . Drug use: No     Allergies   Depakote [divalproex sodium]; Lyrica [pregabalin]; Penicillins; and Topamax [topiramate]   Review of Systems Review of Systems  Unable to perform ROS: Other (Autism, nonverbal)     Physical Exam Updated Vital Signs BP 119/70   Pulse 90   Temp 99.1 F (37.3 C) (Oral)   Resp 19   Ht 5\' 3"  (1.6 m)   Wt 84.8 kg (187 lb)   SpO2 96%   BMI 33.13 kg/m   Physical Exam  Constitutional:  Nonverbal, good color  HENT:  Head: Normocephalic and atraumatic.  Eyes: Conjunctivae are normal.  Neck: Neck supple.  Cardiovascular: Normal rate and regular rhythm.  Pulmonary/Chest: Effort normal and breath  sounds normal.  Abdominal: Soft. Bowel sounds are normal.  Musculoskeletal:  Unable  Neurological:  Unable  Skin: Skin is warm and dry.  Psychiatric:  Unable  Nursing note and vitals reviewed.    ED Treatments / Results  Labs (all labs ordered are listed, but only abnormal results are displayed) Labs Reviewed  COMPREHENSIVE METABOLIC PANEL - Abnormal; Notable for the following components:      Result Value   Potassium 3.1 (*)    Chloride 99 (*)    Glucose, Bld 109 (*)    All other components within normal limits  URINALYSIS, ROUTINE W REFLEX MICROSCOPIC - Abnormal; Notable for the following components:   Ketones, ur 5 (*)    All other components within normal limits  CBG MONITORING, ED - Abnormal; Notable for the following components:   Glucose-Capillary 127 (*)    All other components within normal limits  CBC WITH DIFFERENTIAL/PLATELET  CBG MONITORING, ED    EKG  EKG Interpretation None       Radiology Dg Chest Port 1 View  Result Date: 07/31/2017 CLINICAL DATA:  Seizure EXAM: PORTABLE CHEST 1 VIEW COMPARISON:  July 15, 2017 FINDINGS: No edema or consolidation. Heart size and pulmonary vascularity are normal. No adenopathy. No pneumothorax. No bone lesions. Stimulator noted on the left with leads extending into the left cervical-thoracic junction region. IMPRESSION: No edema or consolidation.  No evident pneumothorax. Electronically Signed   By: Bretta Bang III M.D.   On: 07/31/2017 20:17    Procedures Procedures (including critical care time)  Medications Ordered in ED Medications  sodium chloride 0.9 % bolus 1,000 mL (0 mLs Intravenous Stopped 07/31/17 2242)  ondansetron (ZOFRAN) injection 4 mg (4 mg Intravenous Given 07/31/17 2014)  sodium chloride 0.9 % bolus 1,000 mL (0 mLs Intravenous Stopped 07/31/17 2242)  LORazepam (ATIVAN) injection 1 mg (1 mg Intravenous Given 07/31/17 2015)     Initial Impression / Assessment and Plan / ED Course  I have  reviewed the triage vital signs and the nursing notes.  Pertinent labs & imaging results that were available during my care of the patient were reviewed by me and considered in my medical decision making (see chart for details).     Complex patient with multiple medical problems presents with nausea, vomiting, seizures.  Workup reveals a mildly low potassium; otherwise acceptable.  He has responded well to IV fluids.  Mother states he is back to baseline.  He was able to take his seizure medications in the emergency department.  Mother will take him home tonight and follow-up with his  primary doctor.  Final Clinical Impressions(s) / ED Diagnoses   Final diagnoses:  Seizure disorder (HCC)  Intractable vomiting with nausea, unspecified vomiting type    ED Discharge Orders    None       Donnetta Hutchingook, Naimah Yingst, MD 07/31/17 2340

## 2017-07-31 NOTE — ED Notes (Signed)
Bed: WA04 Expected date:  Expected time:  Means of arrival:  Comments: 27 yo weakness and seizures

## 2017-07-31 NOTE — Telephone Encounter (Signed)
Called mom back to get more information about her phone message. Starting Monday, her was diagnosed with an ear infection, so he is on antibiotics. She states that she thought he was good to go back to school yesterday but he is not able to keep any medication down (seizure medication). She states that they are behind two Oxtellar pills and it is time for him to take his three o'clock pill but she does not think he will be able to tolerate the meds. She states that today he is dry heaving. She just wants to know what to do.

## 2017-07-31 NOTE — Telephone Encounter (Signed)
°  Who's calling (name and relationship to patient) : [name] (mother)  Best contact number: [phone]  Provider they see: Dr. Sharene SkeansHickling  Reason for call: Patients mother called and stated that patient is vomiting and unable to keep medication down. Having numerous small seizures. Requesting a call back right away.

## 2017-07-31 NOTE — Discharge Instructions (Signed)
Lab work, urinalysis, chest x-ray all were acceptable.  Encourage fluids.  Give him his nausea medication as needed.

## 2017-07-31 NOTE — Telephone Encounter (Signed)
2-minute phone call to mother.  Lacosamide (Vimpat), and levetiracetam (Keppra) can be given intravenously.  Oxtellar cannot.  In addition he may have had enough vomiting that he did.  I recommended an emergency room evaluation possibly an IV and possibly giving him the 2 antiepileptic medicines that can be given IV.  Once his stomach is settled, he will be able to take his medications and the brief seizures should stop.

## 2017-07-31 NOTE — ED Notes (Signed)
Pts mother signed for pt due to mental handicap

## 2017-08-02 ENCOUNTER — Encounter: Payer: Self-pay | Admitting: Pediatrics

## 2017-08-05 ENCOUNTER — Telehealth: Payer: Self-pay | Admitting: Pediatrics

## 2017-08-05 ENCOUNTER — Telehealth (INDEPENDENT_AMBULATORY_CARE_PROVIDER_SITE_OTHER): Payer: Self-pay | Admitting: Pediatrics

## 2017-08-05 NOTE — Telephone Encounter (Signed)
MOm called and after Zachary RuizJohn has a seizure he needs oxygen and [facility] needs a letter stating this so the can have oxygen for him  Per mom

## 2017-08-05 NOTE — Telephone Encounter (Signed)
I left a message on the nurse voicemail.  I have no idea exactly what they are asking for.  See anything scanned in her media section.  I asked him to call back to clarify what they need tomorrow unless you already know thank you.

## 2017-08-05 NOTE — Telephone Encounter (Signed)
Who's calling (name and relationship to patient) : Aram Beechamynthia or Lupita LeashDonna [facility] Best contact number: [phone] Provider they see: Sharene SkeansHickling Reason for call: Need fax order over for patient oxygen.  Please call. Fax to [phone]      PRESCRIPTION REFILL ONLY  Name of prescription:  Pharmacy:

## 2017-08-09 ENCOUNTER — Encounter: Payer: Self-pay | Admitting: Pediatrics

## 2017-10-21 ENCOUNTER — Other Ambulatory Visit (INDEPENDENT_AMBULATORY_CARE_PROVIDER_SITE_OTHER): Payer: Self-pay | Admitting: Pediatrics

## 2017-10-21 DIAGNOSIS — G40209 Localization-related (focal) (partial) symptomatic epilepsy and epileptic syndromes with complex partial seizures, not intractable, without status epilepticus: Secondary | ICD-10-CM

## 2017-11-22 ENCOUNTER — Other Ambulatory Visit (INDEPENDENT_AMBULATORY_CARE_PROVIDER_SITE_OTHER): Payer: Self-pay | Admitting: Pediatrics

## 2017-11-22 DIAGNOSIS — G40209 Localization-related (focal) (partial) symptomatic epilepsy and epileptic syndromes with complex partial seizures, not intractable, without status epilepticus: Secondary | ICD-10-CM

## 2017-11-22 NOTE — Telephone Encounter (Signed)
Please send to pharmacy, if possible

## 2017-11-25 ENCOUNTER — Telehealth: Payer: Self-pay | Admitting: Pediatrics

## 2017-11-25 DIAGNOSIS — G40209 Localization-related (focal) (partial) symptomatic epilepsy and epileptic syndromes with complex partial seizures, not intractable, without status epilepticus: Secondary | ICD-10-CM

## 2017-11-25 MED ORDER — LACOSAMIDE 200 MG PO TABS
400.0000 mg | ORAL_TABLET | Freq: Every day | ORAL | 0 refills | Status: DC
Start: 2017-11-25 — End: 2017-12-21

## 2017-11-25 MED ORDER — LACOSAMIDE 100 MG PO TABS: ORAL_TABLET | ORAL | 0 refills | Status: DC

## 2017-11-25 NOTE — Telephone Encounter (Signed)
This was signed and placed on Zachary [name]'s desk on Friday.

## 2017-11-25 NOTE — Telephone Encounter (Signed)
The prescription has been reprinted and placed on Dr. Darl HouseholderHickling's desk

## 2017-11-25 NOTE — Telephone Encounter (Signed)
Rx has been faxed to the pharmacy requested 

## 2017-11-25 NOTE — Telephone Encounter (Signed)
°  Who's calling (name and relationship to patient) : Kearney HardMaryanne- [facility]  Best contact number: [phone]  Provider they see: Sharene SkeansHickling  Reason for call: Needs to know if we will be refilling medications below.     PRESCRIPTION REFILL ONLY  Name of prescription: VIMPAT 100 MG TABS & VIMPAT 200 MG TABS tablet  Pharmacy: [facility] -

## 2017-12-16 ENCOUNTER — Other Ambulatory Visit: Payer: Self-pay | Admitting: Pediatrics

## 2017-12-16 DIAGNOSIS — G40209 Localization-related (focal) (partial) symptomatic epilepsy and epileptic syndromes with complex partial seizures, not intractable, without status epilepticus: Secondary | ICD-10-CM

## 2017-12-20 ENCOUNTER — Telehealth: Payer: Self-pay | Admitting: Pediatrics

## 2017-12-20 MED ORDER — SULFAMETHOXAZOLE-TRIMETHOPRIM 800-160 MG PO TABS: 1.0000 | ORAL_TABLET | Freq: Two times a day (BID) | ORAL | 0 refills | Status: AC

## 2017-12-20 MED ORDER — MUPIROCIN 2 % EX OINT: TOPICAL_OINTMENT | CUTANEOUS | 2 refills | Status: DC

## 2017-12-20 NOTE — Telephone Encounter (Signed)
Zachary [name] is broken out on his chest back and face and mom would like to talk to you

## 2017-12-21 ENCOUNTER — Other Ambulatory Visit: Payer: Self-pay | Admitting: Pediatrics

## 2017-12-21 DIAGNOSIS — G40209 Localization-related (focal) (partial) symptomatic epilepsy and epileptic syndromes with complex partial seizures, not intractable, without status epilepticus: Secondary | ICD-10-CM

## 2017-12-26 ENCOUNTER — Emergency Department (HOSPITAL_COMMUNITY)
Admission: EM | Admit: 2017-12-26 | Discharge: 2017-12-26 | Disposition: A | Discharge status: Home / Self Care | Payer: Medicaid Other | Attending: Emergency Medicine | Admitting: Emergency Medicine

## 2017-12-26 ENCOUNTER — Other Ambulatory Visit: Payer: Self-pay

## 2017-12-26 ENCOUNTER — Emergency Department (HOSPITAL_COMMUNITY): Payer: Medicaid Other

## 2017-12-26 DIAGNOSIS — I10 Essential (primary) hypertension: Secondary | ICD-10-CM | POA: Insufficient documentation

## 2017-12-26 DIAGNOSIS — Z87891 Personal history of nicotine dependence: Secondary | ICD-10-CM | POA: Diagnosis not present

## 2017-12-26 DIAGNOSIS — L42 Pityriasis rosea: Secondary | ICD-10-CM | POA: Insufficient documentation

## 2017-12-26 DIAGNOSIS — F79 Unspecified intellectual disabilities: Secondary | ICD-10-CM | POA: Insufficient documentation

## 2017-12-26 DIAGNOSIS — E876 Hypokalemia: Secondary | ICD-10-CM | POA: Diagnosis not present

## 2017-12-26 DIAGNOSIS — R4182 Altered mental status, unspecified: Secondary | ICD-10-CM | POA: Diagnosis present

## 2017-12-26 LAB — COMPREHENSIVE METABOLIC PANEL
ALT: 34 U/L (ref 0–44)
AST: 20 U/L (ref 15–41)
Albumin: 4 g/dL (ref 3.5–5.0)
Alkaline Phosphatase: 52 U/L (ref 38–126)
Anion gap: 11 (ref 5–15)
BILIRUBIN TOTAL: 0.8 mg/dL (ref 0.3–1.2)
BUN: 15 mg/dL (ref 6–20)
CHLORIDE: 97 mmol/L — AB (ref 98–111)
CO2: 30 mmol/L (ref 22–32)
Calcium: 9.1 mg/dL (ref 8.9–10.3)
Creatinine, Ser: 0.87 mg/dL (ref 0.61–1.24)
GFR calc Af Amer: >60 mL/min (ref >60)
Glucose, Bld: 100 mg/dL — ABNORMAL HIGH (ref 70–99)
Potassium: 2.8 mmol/L — ABNORMAL LOW (ref 3.5–5.1)
Sodium: 138 mmol/L (ref 135–145)
Total Protein: 7.1 g/dL (ref 6.5–8.1)

## 2017-12-26 LAB — CBC WITH DIFFERENTIAL/PLATELET
BASOS ABS: 0 10*3/uL (ref 0.0–0.1)
Basophils Relative: 0 %
Eosinophils Absolute: 0 10*3/uL (ref 0.0–0.7)
Eosinophils Relative: 1 %
HEMATOCRIT: 47 % (ref 39.0–52.0)
Hemoglobin: 16.1 g/dL (ref 13.0–17.0)
LYMPHS PCT: 18 %
Lymphs Abs: 1 10*3/uL (ref 0.7–4.0)
MCH: 28.6 pg (ref 26.0–34.0)
MCHC: 34.3 g/dL (ref 30.0–36.0)
MCV: 83.6 fL (ref 78.0–100.0)
Monocytes Absolute: 0.6 10*3/uL (ref 0.1–1.0)
Monocytes Relative: 9 %
NEUTROS ABS: 4.2 10*3/uL (ref 1.7–7.7)
NEUTROS PCT: 72 %
PLATELETS: 218 10*3/uL (ref 150–400)
RBC: 5.62 MIL/uL (ref 4.22–5.81)
RDW: 13.1 % (ref 11.5–15.5)
WBC: 5.9 10*3/uL (ref 4.0–10.5)

## 2017-12-26 LAB — URINALYSIS, ROUTINE W REFLEX MICROSCOPIC
Bilirubin Urine: NEGATIVE
Glucose, UA: NEGATIVE mg/dL
HGB URINE DIPSTICK: NEGATIVE
Ketones, ur: NEGATIVE mg/dL
Nitrite: NEGATIVE
Protein, ur: NEGATIVE mg/dL
Specific Gravity, Urine: 1.027 (ref 1.005–1.030)
pH: 7 (ref 5.0–8.0)

## 2017-12-26 MED ORDER — POTASSIUM CHLORIDE 10 MEQ/100ML IV SOLN
10.0000 meq | Freq: Once | INTRAVENOUS | Status: AC
  Administered 2017-12-26: 10 meq via INTRAVENOUS
  Filled 2017-12-26: qty 100

## 2017-12-26 MED ORDER — POTASSIUM CHLORIDE CRYS ER 20 MEQ PO TBCR
40.0000 meq | EXTENDED_RELEASE_TABLET | Freq: Once | ORAL | Status: AC
  Administered 2017-12-26: 40 meq via ORAL
  Filled 2017-12-26: qty 2

## 2017-12-26 NOTE — Discharge Instructions (Addendum)
Increase your potassium so you are taking 2 pills once a day.  Follow-up with your doctor as planned on the 30th.  You can use that lotion for itching 3-4 times a day for the next 2 weeks only

## 2017-12-26 NOTE — ED Notes (Signed)
Bed: WA21 Expected date:  Expected time:  Means of arrival:  Comments: EMS General malaise (autistic)

## 2017-12-26 NOTE — ED Provider Notes (Signed)
[facility]-EMERGENCY DEPT Provider Note   CSN: [id number] Arrival date & time: 12/26/17  1001     History   Chief Complaint Chief Complaint  Patient presents with  . Weakness  . Altered Mental Status    HPI Zachary [name] is a 27 y.o. male.  Patient was brought into the emergency department by his mother because he was sleeping a lot this morning for about 20 minutes he had already gotten up and had his breakfast is acting normal and then became very lethargic.  He is back to his normal self patient has significant mental problems from his cerebral palsy and does not communicate orally  The history is provided by a relative. No language interpreter was used.  Illness  This is a new problem. The current episode started less than 1 hour ago. The problem occurs rarely. The problem has been resolved. Pertinent negatives include no chest pain. Nothing aggravates the symptoms. Nothing relieves the symptoms. He has tried nothing for the symptoms. The treatment provided no relief.    Past Medical History:  Diagnosis Date  . 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 Theressa Stamps. Patient's AHI was 39.6 RDI is 43.6 positional component. Patient will need desensitization and a gentle approach to CPAP titration.   . CP (cerebral palsy) (HCC)   . Fracture of femur, intertrochanteric, closed (HCC)   . Hypertension   . MR (mental retardation)   . Recurrent apnea   . Seizures (HCC)   . 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 .     Patient Active Problem List   Diagnosis Date Noted  . Bacterial sinusitis 07/15/2017  . Cough 07/15/2017  . Bacterial conjunctivitis of left eye 01/31/2017  . Gastroenteritis 10/24/2016  . Tinea cruris 03/12/2016  . Acute otitis media of right ear in pediatric patient 10/13/2015  . Cerebral palsy,  unspecified (HCC) 10/13/2015  . Otitis media with effusion 03/27/2015  . Encounter for well adult exam with abnormal findings 01/31/2015  . Need for prophylactic vaccination and inoculation against influenza 01/31/2015  . 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 Status post placement of VNS (vagus nerve stimulation) device 02/11/2014  . Medication monitoring encounter 02/11/2014  . 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 Rhinitis, allergic 12/30/2013  . 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 Well adult health check 04/28/2013  . Fluid retention in legs 01/10/2013  . 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 Generalized hyperhidrosis 10/23/2012  . 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 Hypertension 06/25/2011  . Constipation, chronic 12/27/2010  . Tonic-clonic seizures, intractable (HCC) 11/05/2010  . MRSA (methicillin resistant Staphylococcus aureus) carrier 11/05/2010    Past Surgical History:  Procedure Laterality Date  . FOOT SURGERY    . FRACTURE SURGERY    . HIP SURGERY    . IMPLANTATION VAGAL NERVE STIMULATOR    . Pediatomy  2009  . TONSILLECTOMY  2009  . TYMPANOSTOMY TUBE PLACEMENT  2009        Home Medications    Prior to Admission medications   Medication Sig Start Date End Date Taking? Authorizing Provider  acetaminophen (TYLENOL) 500 MG tablet Take 500 mg  by mouth every 6 (six) hours as needed for mild pain, moderate pain, fever or headache.   Yes [provider]  cetirizine (ZYRTEC) 10 MG tablet Take 1 tablet (10 mg total) by mouth daily as needed for allergies. 03/12/16  Yes Ramgoolam, Emeline Gins, MD  chlorthalidone (HYGROTON) 25 MG tablet Take 25 mg by mouth 2 (two)  times daily.   Yes [provider]  cholecalciferol (VITAMIN D) 1000 units tablet Take 1,000 Units by mouth daily.   Yes [provider]  docusate sodium (COLACE) 100 MG capsule Take 200 mg by mouth daily.   Yes [provider]  KEPPRA 1000 MG tablet TAKE ONE AND ONE-HALF TABLET TWICE DAILY 04/17/17  Yes Hickling, Deanna Artis, MD  Lacosamide (VIMPAT) 100 MG TABS TAKE ONE TABLET IN THE MORNING AND TAKE ONE TABLET AT 3PM 12/16/17  Yes [name], MD  LORazepam (ATIVAN) 1 MG tablet Take 1 tablet by mouth as needed for agitation. 07/18/16  Yes Deetta Perla, MD  mupirocin ointment (BACTROBAN) 2 % Apply 1 application topically 2 (two) times daily. To affected skin. Patient taking differently: Apply 1 application topically 2 (two) times daily as needed (for skin). To affected skin. 03/26/15  Yes Ramgoolam, Emeline Gins, MD  OXTELLAR XR 600 MG TB24 TAKE 3 TABLETS AT BEDTIME 04/29/17  Yes Hickling, Deanna Artis, MD  polyethylene glycol (MIRALAX / GLYCOLAX) packet Take 17 g by mouth daily.   Yes [provider]  spironolactone (ALDACTONE) 25 MG tablet Take 25 mg by mouth at bedtime.   Yes [provider]  sulfamethoxazole-trimethoprim (BACTRIM DS,SEPTRA DS) 800-160 MG tablet Take 1 tablet by mouth 2 (two) times daily for 10 days. 12/20/17 12/30/17 Yes Ramgoolam, Emeline Gins, MD  VIMPAT 200 MG TABS tablet TAKE 2 TABLETS AT BEDTIME 12/23/17  Yes Elveria Rising, NP  amoxicillin-clavulanate (AUGMENTIN) 500-125 MG tablet Take 1 tablet (500 mg total) by mouth 3 (three) times daily. Patient not taking: Reported on 07/31/2017 07/15/17   [name], MD  DIASTAT ACUDIAL 20 MG GEL Give 15 mg rectally after 2 minutes of seizure. 04/21/14   Deetta Perla, MD  midazolam (VERSED) 5 MG/ML injection Place 2 mLs (10 mg total) into the nose once. Draw up 1ml in 2 syringes. Remove blue vial access device. Attach syringe to nasal atomizer for intranasal administration. Give 1ml in  each nostril for seizures lasting 2 minutes or longer. 03/29/17 07/31/17  Deetta Perla, MD  promethazine (PHENERGAN) 25 MG suppository Place 1 suppository (25 mg total) rectally every 6 (six) hours as needed for nausea or vomiting. 10/22/16 07/31/17  [name], MD    Family History Family History  Problem Relation Age of Onset  . Cancer Paternal Grandmother        Died in her 50's  . Diabetes Paternal Grandfather        Died in his 38's     Social History Social History   Tobacco Use  . Smoking status: Never Smoker  . Smokeless tobacco: Never Used  Substance Use Topics  . Alcohol use: No  . Drug use: No     Allergies   Depakote [divalproex sodium]; Lyrica [pregabalin]; Penicillins; and Topamax [topiramate]   Review of Systems Review of Systems  Unable to perform ROS: Other  Cardiovascular: Negative for chest pain.     Physical Exam Updated Vital Signs BP 116/83 (BP Location: Right Arm)   Pulse 78   Resp 16   Ht 5\' 3"  (1.6 m)   Wt  84.8 kg (187 lb)   SpO2 100%   BMI 33.13 kg/m   Physical Exam  Constitutional: He appears well-developed.  HENT:  Head: Normocephalic.  Eyes: Conjunctivae and EOM are normal. No scleral icterus.  Neck: Neck supple. No thyromegaly present.  Cardiovascular: Normal rate and regular rhythm. Exam reveals no gallop and no friction rub.  No murmur heard. Pulmonary/Chest: No stridor. He has no wheezes. He has no rales. He exhibits no tenderness.  Abdominal: He exhibits no distension. There is no tenderness. There is no rebound.  Musculoskeletal: Normal range of motion. He exhibits no edema.  Lymphadenopathy:    He has no cervical adenopathy.  Neurological: He exhibits normal muscle tone. Coordination normal.  Patient alert but cannot communicate this is his normal  Skin: No rash noted. There is erythema.  Rash consistent with pityriasis rosea     ED Treatments / Results  Labs (all labs ordered are listed, but only  abnormal results are displayed) Labs Reviewed  COMPREHENSIVE METABOLIC PANEL - Abnormal; Notable for the following components:      Result Value   Potassium 2.8 (*)    Chloride 97 (*)    Glucose, Bld 100 (*)    All other components within normal limits  URINALYSIS, ROUTINE W REFLEX MICROSCOPIC - Abnormal; Notable for the following components:   Leukocytes, UA TRACE (*)    Bacteria, UA RARE (*)    All other components within normal limits  URINE CULTURE  CBC WITH DIFFERENTIAL/PLATELET    EKG None  Radiology Ct Head Wo Contrast  Result Date: 12/26/2017 CLINICAL DATA:  27 year old male with fatigue and weakness. Unresponsive for about 20 minutes. History of seizures. EXAM: CT HEAD WITHOUT CONTRAST TECHNIQUE: Contiguous axial images were obtained from the base of the skull through the vertex without intravenous contrast. COMPARISON:  Head CTs 09/30/2010 and earlier.  Brain MRI 09/26/2004. 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 Biparietal atrophy has developed since 2004/10/16 but has not significantly changed since 2010-10-17. Mild colpocephaly and an angular configuration of the lateral ventricles are stable. No midline shift, ventriculomegaly, mass effect, evidence of mass lesion, intracranial hemorrhage or evidence of cortically based acute infarction. No other encephalomalacia, outside of the parietal lobes gray-white matter differentiation is within normal limits throughout the brain. Vascular: No suspicious intracranial vascular hyperdensity. Skull: Stable and negative. Sinuses/Orbits: Visualized paranasal sinuses and mastoids are stable and well pneumatized. Other: There is a wide, broad-based area of chronic scalp soft tissue thickening at the vertex which appears unchanged since 10/17/2010. No acute orbit or scalp soft tissue finding. Chronic Disconjugate gaze. IMPRESSION: 1. No acute intracranial abnormality and stable non contrast CT appearance of the  brain since Oct 17, 2010. 2. Pronounced chronic biparietal atrophy. Angular appearance of the lateral ventricles which can be seen with periventricular leukomalacia (PVL). 3. Chronic nonspecific scalp soft tissue thickening/swelling at the vertex. Electronically Signed   By: Odessa Fleming M.D.   On: 12/26/2017 10:58    Procedures Procedures (including critical care time)  Medications Ordered in ED Medications  potassium chloride SA (K-DUR,KLOR-CON) CR tablet 40 mEq (40 mEq Oral Given 12/26/17 1315)  potassium chloride 10 mEq in 100 mL IVPB (0 mEq Intravenous Stopped 12/26/17 1448)  potassium chloride 10 mEq in 100 mL IVPB (0 mEq Intravenous Stopped 12/26/17 1415)     Initial Impression / Assessment and Plan / ED Course  I have reviewed the triage vital signs and the nursing notes.  Pertinent labs &  imaging results that were available during my care of the patient were reviewed by me and considered in my medical decision making (see chart for details). CRITICAL CARE Performed by: Bethann BerkshireJoseph Henleigh Robello Total critical care time: 35minutes Critical care time was exclusive of separately billable procedures and treating other patients. Critical care was necessary to treat or prevent imminent or life-threatening deterioration. Critical care was time spent personally by me on the following activities: development of treatment plan with patient and/or surrogate as well as nursing, discussions with consultants, evaluation of patient's response to treatment, examination of patient, obtaining history from patient or surrogate, ordering and performing treatments and interventions, ordering and review of laboratory studies, ordering and review of radiographic studies, pulse oximetry and re-evaluation of patient's condition.     Patient with pityriasis rosea.  He will be given triamcinolone lotion to help with the pruritus.  He also has hypokalemia which has been treated with IV potassium and p.o. potassium and he will increase  his potassium to take 2 pills a day and follow-up with his doctor in a week as planned patient orientation is completely back to normal I am not sure what caused his lethargy is possible he has a nonclinical seizure  Final Clinical Impressions(s) / ED Diagnoses   Final diagnoses:  Hypokalemia  Pityriasis rosea    ED Discharge Orders    None       Bethann BerkshireZammit, Broughton Eppinger, MD 12/26/17 1501

## 2017-12-26 NOTE — ED Notes (Signed)
Gave Pt

## 2017-12-26 NOTE — ED Notes (Signed)
Mother verbalized understanding of discharge instructions, no questions. Patient out of ED via wheelchair in no distress.

## 2017-12-26 NOTE — ED Triage Notes (Signed)
Patient here with c/o fatigue and weakness. Mother states patient was normal this morning, walking. In car, patient was unresponsive, unable to follow commands, ~20 minutes. Patient has hx of seizures. Patient on antibiotics for skin infection. Patient did take medication this morning. Patient currently at baseline. Hx autism, nonverbal.   BP: 142/88 HR: 86 RR: 18 O2: 98% CBG: 101

## 2017-12-26 NOTE — ED Notes (Signed)
UNSUCCESSFUL LAB COLLECTION ATTEMPT MADE

## 2017-12-27 LAB — URINE CULTURE: CULTURE: NO GROWTH

## 2018-01-22 ENCOUNTER — Other Ambulatory Visit: Payer: Self-pay | Admitting: Pediatrics

## 2018-01-22 ENCOUNTER — Other Ambulatory Visit: Payer: Self-pay | Admitting: Family

## 2018-01-22 DIAGNOSIS — G40209 Localization-related (focal) (partial) symptomatic epilepsy and epileptic syndromes with complex partial seizures, not intractable, without status epilepticus: Secondary | ICD-10-CM

## 2018-01-23 ENCOUNTER — Other Ambulatory Visit: Payer: Self-pay | Admitting: Family

## 2018-01-23 DIAGNOSIS — G40209 Localization-related (focal) (partial) symptomatic epilepsy and epileptic syndromes with complex partial seizures, not intractable, without status epilepticus: Secondary | ICD-10-CM

## 2018-02-20 ENCOUNTER — Other Ambulatory Visit: Payer: Self-pay | Admitting: Family

## 2018-02-20 ENCOUNTER — Other Ambulatory Visit: Payer: Self-pay | Admitting: Pediatrics

## 2018-02-20 DIAGNOSIS — G40209 Localization-related (focal) (partial) symptomatic epilepsy and epileptic syndromes with complex partial seizures, not intractable, without status epilepticus: Secondary | ICD-10-CM

## 2018-02-21 MED ORDER — LACOSAMIDE 100 MG PO TABS: ORAL_TABLET | ORAL | 0 refills | Status: DC

## 2018-02-25 ENCOUNTER — Ambulatory Visit (INDEPENDENT_AMBULATORY_CARE_PROVIDER_SITE_OTHER): Payer: Medicaid Other | Admitting: Pediatrics

## 2018-03-17 ENCOUNTER — Telehealth: Payer: Self-pay | Admitting: Pediatrics

## 2018-03-17 NOTE — Telephone Encounter (Signed)
Form from the [facility] on your desk to fill out please

## 2018-03-18 NOTE — Telephone Encounter (Signed)
DMV form filled

## 2018-03-20 ENCOUNTER — Encounter (INDEPENDENT_AMBULATORY_CARE_PROVIDER_SITE_OTHER): Payer: Self-pay | Admitting: Pediatrics

## 2018-03-20 ENCOUNTER — Ambulatory Visit (INDEPENDENT_AMBULATORY_CARE_PROVIDER_SITE_OTHER): Payer: Medicaid Other | Admitting: Pediatrics

## 2018-03-20 DIAGNOSIS — R451 Restlessness and agitation: Secondary | ICD-10-CM | POA: Diagnosis not present

## 2018-03-20 DIAGNOSIS — G40209 Localization-related (focal) (partial) symptomatic epilepsy and epileptic syndromes with complex partial seizures, not intractable, without status epilepticus: Secondary | ICD-10-CM | POA: Diagnosis not present

## 2018-03-20 DIAGNOSIS — G40319 Generalized idiopathic epilepsy and epileptic syndromes, intractable, without status epilepticus: Secondary | ICD-10-CM

## 2018-03-20 MED ORDER — VIMPAT 200 MG PO TABS: 400.0000 mg | ORAL_TABLET | Freq: Every day | ORAL | 5 refills | Status: DC

## 2018-03-20 MED ORDER — KEPPRA 1000 MG PO TABS: ORAL_TABLET | ORAL | 5 refills | Status: DC

## 2018-03-20 MED ORDER — LORAZEPAM 1 MG PO TABS: ORAL_TABLET | ORAL | 5 refills | Status: DC

## 2018-03-20 MED ORDER — OXTELLAR XR 600 MG PO TB24: 3.0000 | ORAL_TABLET | Freq: Every day | ORAL | 5 refills | Status: DC

## 2018-03-20 MED ORDER — VIMPAT 100 MG PO TABS: ORAL_TABLET | ORAL | 5 refills | Status: DC

## 2018-03-20 MED ORDER — DIASTAT ACUDIAL 20 MG RE GEL: RECTAL | 5 refills | Status: DC

## 2018-03-20 NOTE — Progress Notes (Signed)
Patient: Zachary [name] MRN: [id number] Sex: male DOB: 1991/04/20  Provider: Ellison Carwin, MD Location of Care: [facility]  Note type: Routine return visit  History of Present Illness: Referral Source: [name], MD History from: mother and [facility] chart Chief Complaint: VNS/Epilepsy  Zachary [name] is a 27 y.o. male who was evaluated on March 20, 2018 for the first time since March 29, 2017.  I was quite surprised that it has been a whole year since I saw him.  I felt certain that I had seen him before then.    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  He has done well since his last visit.  They are very 26 2019 he presented to the emergency department evaluation after he had nausea and vomiting of 4 days and had evidence of dehydration.  He was evaluated and noted to have a potassium of 3.1.  He also had ketones in his urine.  No other significant physical abnormalities were seen except for his quadriparesis and intellectual dysfunction which were unchanged.  He responded to IV fluids and returned to baseline and therefore was discharged home.     He had another emergency department visit with hypokalemia and had become just extremely lethargic.  I am not certain that he had a seizure, but I suspect that his mother thinks that he had an unwitnessed one.  His potassium at that time was 2.8.  There was one episode that apparently was not severe enough to require an ED visit.  He is receiving 2 medications that are potassium-sparing of antihypertensives, and also receives 20 mEq twice daily of potassium chloride.  This is an unusual situation.  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  He also has essential hypertension, a variety of skin conditions, allergic rhinitis, and  constipation.  He has sleep apnea which has been treated with CPAP.  This apparently is exacerbated by his vagal nerve stimulator but the VNS has been singularly responsible for his good seizure control.  He takes and tolerates his multiple antiepileptic medications that include Oxtellar XR, Vimpat, and Keppra, all trade drugs.  He receives lorazepam when he is agitated and he has both Diastat and midazolam for breakthrough seizures.  His appetite is good.  He has been sleeping well.  He occasionally will slap his head when he is frustrated.  I think this is a manifestation of his autism.  In general, he looks well and his mother did not have any significant concerns today.  Procedure: interrogation of vagal nerve stimulator  The implanted device is series 102, serial [id number], implanted May 23, 2011.  On interrogation: Normal amplitude 2.50 mA, frequency 20 Hz, pulse width 250 s, stimulation duration 7 seconds, stimulation interval 0.8 minutes Magnet amplitude 2.75 mA, magnet stimulation duration 60 seconds, magnet pulse width 250 s.  Number of stimuli since last visit is 77; total number since implantation: 788.  Systemdiagnostics with an amplitude of 1.50milliamps again showed intact communication, output, and impedance. DC/ DC conversion code was 1. There is no indication to change the battery.  Review of Systems: A complete review of systems was remarkable for mom reports that patient has had 3 seizures due to potassium dropping. She also states that the patient has been slapping the side of his head more , all other systems reviewed and negative.  Past Medical History  Diagnosis Date  . 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 Theressa Stamps. Patient's AHI was 39.6 RDI is 43.6 positional component. Patient will need desensitization and a gentle approach to CPAP titration.   . CP (cerebral palsy) (HCC)   . Fracture of femur,  intertrochanteric, closed (HCC)   . Hypertension   . MR (mental retardation)   . Recurrent apnea   . Seizures (HCC)   . 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 Marland Kitchen    Hospitalizations: No., Head Injury: No., Nervous System Infections: No., Immunizations up to date: Yes.    I began to see him on July 10, 2004. He apparently was seen by me when he was eight months of age with developmental delay. Seizure activity; however, did not began until he is 14, two days after starting amitriptyline. 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 His most recent CT scan of the brain was on September 29, 2009, and showed no significant changes.  EEG showed diffuse background slowing.   He was treated with Depakote, Topamax, and Lyrica, but suffered side effects and agitation from those medications. He has taken Trileptal, Keppra, and Vimpat for quite some time. He was seen at [facility] on Oct 27, 2010 for 6 days.for a phase 1 evaluation. MRI of the brain showed ex-vacuo dilatation of his ventricles and cortical atrophy. Volume loss most pronounced in the parietal lobes bilaterally, temporal lobes with widening of the sylvian fissures and a paucity of white matter diffusely most pronounced again in the parietal lobes with patchy T2 hyperintensity. 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 There was no evidence of mesial temporal sclerosis.  Magnetoencephalogram showed epileptiform activity arising from the left posterior temple region with a field extending to the occipital lobe. This was not seen with concurrent routine EEG.  PET showed decreased accumulation of fluorodeoxyglucose in bilateral  parietal and temporal lobes in comparison with the remainder of the cerebral hemispheres. This was somewhat worse in the right than the left bilateral temporal cortex and temporal pole suggesting a longstanding insult.  Prolonged video EEG showed onset of bifrontal slowing that evolved into rhythmic activity with spikes of greater amplitude over the right central region with secondary generalization. Clinically, the patient has onset of a myoclonic jerk with his head turned to the left, movement of the left arm and leg, then tonic posturing followed by tonic-clonic generalized movements.  One day later he had bifrontal rhythmic beta range activity followed by decrement in voltage with fast beta range activity for about 30 seconds and then generalized rhythmic beta range activity. The patient had a slight head bob slowly lowered to the bed with stiffening of both upper extremities and tonic-clonic movement of both upper extremities followed by secondary generalization in his legs. A total of seven seizures were monitored. The last three were obscured by electrode artifact. Seizure onset appeared to occur from both frontal regions. This led to the recommendations to place the vagal nerve stimulator.  .  He has frequent ear infections, urinary tract infections, constipation and a hip fracture. He has had problems with neutropenia and thrombocytopenia. He had some vomiting in June, 2010 and developed  aspiration pneumonia. He tends to have a temperature of 99 in the late evening. He has had several episodes in which his left knee has been dislocated. This happened as recently as August 28, 2009. He sat down in a chair and his knee went out of place. EMS was called and he was taken to the ER. While waiting for the ER physician, Zachary [name] stretched and his knee went back in to place. His mother said that he was subsequently seen by his orthopedist, who thinks that he may eventually need surgery on his knee.    Behavior History none  Surgical History Procedure Laterality Date  . FOOT SURGERY    . FRACTURE SURGERY    . HIP SURGERY    . IMPLANTATION VAGAL NERVE STIMULATOR    . Pediatomy  2009  . TONSILLECTOMY  2009  . TYMPANOSTOMY TUBE PLACEMENT  2009   Family History family history includes Cancer in his paternal grandmother; Diabetes in his paternal grandfather. Family history is negative for migraines, seizures, intellectual disabilities, blindness, deafness, birth defects, chromosomal disorder, or autism.  Social History Social Needs  . Financial resource strain: Not on file  . Food insecurity:    Worry: Not on file    Inability: Not on file  . 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     He enjoys swimming and going to the beach.   Allergies Allergen Reactions  . Depakote [Divalproex Sodium] Other (See Comments)    Low Platelets  . Lyrica [Pregabalin] Other (See Comments)    Sleepiness  . Penicillins Hives and Rash    Has patient had a PCN reaction causing immediate rash, facial/tongue/throat swelling, SOB or lightheadedness with hypotension: Unknown Has patient had a PCN reaction causing severe rash involving mucus membranes or skin necrosis: Yes Has patient had a PCN reaction that required hospitalization: No Has patient had a PCN reaction occurring within the last 10 years: No If all of the above answers are "NO", then may proceed with Cephalosporin use.   . Topamax [Topiramate] Other (See Comments)    Weight Loss   Physical Exam BP 120/82   Pulse 68   Wt 187 lb (84.8 kg)   BMI 33.13 kg/m   General: alert, well developed, well nourished, in no acute distress, black hair, brown eyes, right handed (poor use of both hands) Head: normocephalic, no dysmorphic features Ears, Nose and Throat: Otoscopic: tympanic membranes normal;  pharynx: oropharynx is pink without exudates or tonsillar hypertrophy Neck: supple, full range of motion, no cranial or cervical bruits Respiratory: auscultation clear Cardiovascular: no murmurs, pulses are normal Musculoskeletal: no skeletal deformities or apparent scoliosis Skin: no rashes or neurocutaneous lesions  Neurologic Exam  Mental Status: alert; poor eye contact, unable to follow commands, resists examination Cranial Nerves: visual fields are difficult to evaluate but appear full to double simultaneous stimuli; extraocular movements are full and dysconjugate; pupils are round, reactive to light; funduscopic examination shows positive red reflex with photophobia; symmetric impassive facial strength; midline tongue; does not localize sound, but I do not believe that he is deaf Motor: Diminished spastic strength, worse on the left side with fisting of both hands left greater than right, increased spastic tone and generally normal mass; for fine motor movements Sensory: withdrawal x4 Coordination: unable to test Gait and Station: broad-based,  diplegia gait, requires close contact support Reflexes: symmetric and diminished bilaterally; no clonus; bilateral flexor plantar responses  Assessment 1. 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 2. 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 3. Agitation, R45.1. 4. 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 5. Complex sleep apnea syndrome, G47.31.  Discussion The patient is doing well.  There is no reason to make any changes in his medication.  I evaluated and did not reprogram his vagal nerve stimulator.  He has had it now for 7 years.  I expect that we are going to see some changes but have not seen them yet.  His VNS was implanted at [facility] and we will send him back there for reimplantation.  I am not  certain why he is having hypokalemia and if that really is responsible for seizures.  Plan I refilled his prescriptions for Oxtellar, Keppra both doses of Vimpat, brand name medically necessary, lorazepam.  He did not need midazolam refilled.  He will return to see me in 3 months' time.  I will see him sooner based on clinical need.  Greater than 50% of a 25 minute visit was spent in counseling and coordination of care, and in addition, I evaluated his vagal nerve stimulator.   Medication List    Accurate as of 03/20/18 11:59 PM.      acetaminophen 500 MG tablet Commonly known as:  TYLENOL Take 500 mg by mouth every 6 (six) hours as needed for mild pain, moderate pain, fever or headache.   cetirizine 10 MG tablet Commonly known as:  ZYRTEC Take 1 tablet (10 mg total) by mouth daily as needed for allergies.   chlorthalidone 25 MG tablet Commonly known as:  HYGROTON Take 25 mg by mouth 2 (two) times daily.   cholecalciferol 1000 units tablet Commonly known as:  VITAMIN D Take 1,000 Units by mouth daily.   DIASTAT ACUDIAL 20 MG Gel Generic drug:  diazepam Give 15 mg rectally after 2 minutes of seizure.   docusate sodium 100 MG capsule Commonly known as:  COLACE Take 200 mg by mouth daily.   KEPPRA 1000 MG tablet Generic drug:  levETIRAcetam Take 1-1/2 tablets twice daily   LORazepam 1 MG tablet Commonly known as:  ATIVAN Take 1 tablet by mouth as needed for agitation.   mupirocin ointment 2 % Commonly known as:  BACTROBAN Apply 1 application topically 2 (two) times daily. To affected skin.   OXTELLAR XR 600 MG Tb24 Generic drug:  OXcarbazepine ER Take 3 tablets by mouth at bedtime.   polyethylene glycol packet Commonly known as:  MIRALAX / GLYCOLAX Take 17 g by mouth daily.   potassium chloride 20 MEQ packet Commonly known as:  KLOR-CON Take 20 mEq by mouth 2 (two) times daily.   promethazine 25 MG suppository Commonly known as:  PHENERGAN Place 1 suppository  (25 mg total) rectally every 6 (six) hours as needed for nausea or vomiting.   spironolactone 25 MG tablet Commonly known as:  ALDACTONE Take 25 mg by mouth at bedtime.   VIMPAT 200 MG Tabs tablet Generic drug:  lacosamide Take 2 tablets (400 mg total) by mouth at bedtime.   VIMPAT 100 MG Tabs Generic drug:  Lacosamide TAKE ONE TABLET IN THE MORNING AND TAKE ONE TABLET AT 3PM    The medication list was reviewed and reconciled. All changes or newly prescribed medications were explained.  A complete medication list was provided  to the patient/caregiver.  Jodi Geralds MD

## 2018-03-20 NOTE — Patient Instructions (Signed)
I am glad that Zachary [name] is doing well.  I can find the potassium chloride in my drug formulary.  Please give me details about what it says on the label and I will included in the drug list.

## 2018-03-26 ENCOUNTER — Other Ambulatory Visit: Payer: Self-pay | Admitting: Family

## 2018-03-26 DIAGNOSIS — G40209 Localization-related (focal) (partial) symptomatic epilepsy and epileptic syndromes with complex partial seizures, not intractable, without status epilepticus: Secondary | ICD-10-CM

## 2018-03-29 ENCOUNTER — Other Ambulatory Visit: Payer: Self-pay | Admitting: Pediatrics

## 2018-04-15 ENCOUNTER — Ambulatory Visit (INDEPENDENT_AMBULATORY_CARE_PROVIDER_SITE_OTHER): Payer: Medicaid Other | Admitting: Pediatrics

## 2018-04-15 ENCOUNTER — Encounter: Payer: Self-pay | Admitting: Pediatrics

## 2018-04-15 VITALS — BP 120/82 | Ht 63.0 in | Wt 187.0 lb

## 2018-04-15 DIAGNOSIS — Z0100 Encounter for examination of eyes and vision without abnormal findings: Secondary | ICD-10-CM

## 2018-04-15 DIAGNOSIS — Z23 Encounter for immunization: Secondary | ICD-10-CM

## 2018-04-15 DIAGNOSIS — Z0001 Encounter for general adult medical examination with abnormal findings: Secondary | ICD-10-CM | POA: Diagnosis not present

## 2018-04-15 DIAGNOSIS — F840 Autistic disorder: Secondary | ICD-10-CM

## 2018-04-15 DIAGNOSIS — Z0000 Encounter for general adult medical examination without abnormal findings: Secondary | ICD-10-CM

## 2018-04-15 DIAGNOSIS — G40209 Localization-related (focal) (partial) symptomatic epilepsy and epileptic syndromes with complex partial seizures, not intractable, without status epilepticus: Secondary | ICD-10-CM

## 2018-04-15 MED ORDER — POLYETHYLENE GLYCOL 3350 17 G PO PACK: 17.0000 g | PACK | Freq: Every day | ORAL | 12 refills | Status: AC

## 2018-04-15 MED ORDER — FLUOCINOLONE ACETONIDE BODY 0.01 % EX OIL: TOPICAL_OIL | CUTANEOUS | 12 refills | Status: AC

## 2018-04-15 NOTE — Progress Notes (Signed)
Adolescent Well Care Visit Zachary [name] is a 27 y.o. male who is here for well care.    PCP:  Georgiann Hahnamgoolam, Dannetta Lekas, MD   History was provided by the mother.     Current Issues: Wheelchair bound Universal Health[employer]'s Bilaterally Bath chair Chillout chair Nebulizer with tubings Pulse ox Oxygen tubes Oxygen tanks Diapers Wipes/Gloves G -tube with tubings    Nutrition: NOURISH and ensure  Chopped feeding    Sleep:  Sleep: ok  Social Screening: Guardian  Education: [employer]: [employer]  Followed by: Neuro--Dr Sharene [facility]--Dr Lazarus SalinesWol[facility]--Dr Vinson MoselleBryan [facility]--DR Emeline GinsSara Upton   PHQ-9 completed and results indicated N/A  Physical Exam:  Vitals:   04/15/18 0955  BP: 120/82  Weight: 187 lb (84.8 kg)  Height: 5\' 3"  (1.6 m)   BP 120/82   Ht 5\' 3"  (1.6 m)   Wt 187 lb (84.8 kg)   BMI 33.13 kg/m  Body mass index: body mass index is 33.13 kg/m. Growth percentile SmartLinks can only be used for patients less than 28 years old.  No exam data present  General Appearance:   no distress  HENT: Normocephalic, no obvious abnormality, conjunctiva clear  Mouth:   Normal appearing teeth, no obvious discoloration, dental caries, or dental caps     Chest normal  Lungs:   Clear to auscultation bilaterally, normal work of breathing  Heart:   Regular rate and rhythm, S1 and S2 normal, no murmurs;   Abdomen:   Soft, non-tender, no mass, G tube  GU normal male genitals, no testicular masses or hernia  Musculoskeletal:   Normal tone--wheelchair bound               Lymphatic:   No cervical adenopathy  Skin/Hair/Nails:   Skin warm, dry and intact, no rashes, no bruises or petechiae  Neurologic:   Baseline mental status --non verbal. Severe developmental delay     Assessment and Plan:   Annual Check   BMI is not appropriate for age  Needs referral for vision screen--may not be seeing well.   Counseling provided for all of the vaccine components  Orders Placed  This Encounter  Procedures  . Flu Vaccine QUAD 6+ mos PF IM (Fluarix Quad PF)     Return in about 6 months (around 10/14/2018).[name].  Jeanann Balinski, MD

## 2018-04-15 NOTE — Patient Instructions (Signed)
Seizure, Adult °When you have a seizure: °· Parts of your body may move. °· How aware or awake (conscious) you are may change. °· You may shake (convulse). ° °Some people have symptoms right before a seizure happens. These symptoms may include: °· Fear. °· Worry (anxiety). °· Feeling like you are going to throw up (nausea). °· Feeling like the room is spinning (vertigo). °· Feeling like you saw or heard something before (deja vu). °· Odd tastes or smells. °· Changes in vision, such as seeing flashing lights or spots. ° °Seizures usually last from 30 seconds to 2 minutes. Usually, they are not harmful unless they last a long time. °Follow these instructions at home: °Medicines °· Take over-the-counter and prescription medicines only as told by your doctor. °· Avoid anything that may keep your medicine from working, such as alcohol. °Activity °· Do not do any activities that would be dangerous if you had another seizure, like driving or swimming. Wait until your doctor approves. °· If you live in the [place] ask your local DMV (department of motor vehicles) when you can drive. °· Rest. °Teaching others °· Teach friends and family what to do when you have a seizure. They should: °? Lay you on the ground. °? Protect your head and body. °? Loosen any tight clothing around your neck. °? Turn you on your side. °? Stay with you until you are better. °? Not hold you down. °? Not put anything in your mouth. °? Know whether or not you need emergency care. °General instructions °· Contact your doctor each time you have a seizure. °· Avoid anything that gives you seizures. °· Keep a seizure diary. Write down: °? What you think caused each seizure. °? What you remember about each seizure. °· Keep all follow-up visits as told by your doctor. This is important. °Contact a doctor if: °· You have another seizure. °· You have seizures more often. °· There is any change in what happens during your seizures. °· You continue to have  seizures with treatment. °· You have symptoms of being sick or having an infection. °Get help right away if: °· You have a seizure: °? That lasts longer than 5 minutes. °? That is different than seizures you had before. °? That makes it harder to breathe. °? After you hurt your head. °· After a seizure, you cannot speak or use a part of your body. °· After a seizure, you are confused or have a bad headache. °· You have two or more seizures in a row. °· You are having seizures more often. °· You do not wake up right after a seizure. °· You get hurt during a seizure. °In an emergency: °· These symptoms may be an emergency. Do not wait to see if the symptoms will go away. Get medical help right away. Call your local emergency services (911 in the [place] Do not drive yourself to the hospital. °This information is not intended to replace advice given to you by your health care provider. Make sure you discuss any questions you have with your health care provider. °Document Released: 11/07/2007 Document Revised: 02/01/2016 Document Reviewed: 02/01/2016 °Elsevier Interactive Patient Education © 2017 [employer] ° °

## 2018-04-16 NOTE — Addendum Note (Signed)
Addended by: Saul FordyceLOWE, Shatavia Santor M on: 04/16/2018 12:34 PM   Modules accepted: Orders

## 2018-04-28 ENCOUNTER — Other Ambulatory Visit (INDEPENDENT_AMBULATORY_CARE_PROVIDER_SITE_OTHER): Payer: Self-pay | Admitting: Pediatrics

## 2018-04-28 DIAGNOSIS — G40209 Localization-related (focal) (partial) symptomatic epilepsy and epileptic syndromes with complex partial seizures, not intractable, without status epilepticus: Secondary | ICD-10-CM

## 2018-05-09 ENCOUNTER — Other Ambulatory Visit (INDEPENDENT_AMBULATORY_CARE_PROVIDER_SITE_OTHER): Payer: Self-pay | Admitting: Pediatrics

## 2018-05-09 DIAGNOSIS — G40209 Localization-related (focal) (partial) symptomatic epilepsy and epileptic syndromes with complex partial seizures, not intractable, without status epilepticus: Secondary | ICD-10-CM

## 2018-06-19 ENCOUNTER — Encounter (INDEPENDENT_AMBULATORY_CARE_PROVIDER_SITE_OTHER): Payer: Self-pay | Admitting: Pediatrics

## 2018-06-19 ENCOUNTER — Ambulatory Visit (INDEPENDENT_AMBULATORY_CARE_PROVIDER_SITE_OTHER): Payer: Medicaid Other | Admitting: Pediatrics

## 2018-06-19 VITALS — BP 110/90 | HR 64 | Wt 187.0 lb

## 2018-06-19 DIAGNOSIS — G40319 Generalized idiopathic epilepsy and epileptic syndromes, intractable, without status epilepticus: Secondary | ICD-10-CM | POA: Diagnosis not present

## 2018-06-19 DIAGNOSIS — G808 Other cerebral palsy: Secondary | ICD-10-CM

## 2018-06-19 DIAGNOSIS — F840 Autistic disorder: Secondary | ICD-10-CM

## 2018-06-19 DIAGNOSIS — G40209 Localization-related (focal) (partial) symptomatic epilepsy and epileptic syndromes with complex partial seizures, not intractable, without status epilepticus: Secondary | ICD-10-CM

## 2018-06-19 DIAGNOSIS — F71 Moderate intellectual disabilities: Secondary | ICD-10-CM

## 2018-06-19 MED ORDER — NAYZILAM 5 MG/0.1ML NA SOLN: 5.0000 mg | Freq: Once | NASAL | 5 refills | Status: DC

## 2018-06-19 NOTE — Patient Instructions (Signed)
Let us get drug levels.  I have sent the [facility] to your pharmacy.  Check with https://www.hebert-diaz.info/ to see if there is any co-pay relief.

## 2018-06-19 NOTE — Progress Notes (Signed)
Patient: Zachary [name] MRN: [id number] Sex: male DOB: 05-15-1991  Provider: Ellison Carwin, MD Location of Care: [facility]  Note type: Routine return visit  History of Present Illness: Referral Source: [name], MD History from: mother and friends, patient and [facility] chart Chief Complaint: VNS/Epilepsy  [name] is a 28 y.o. male who was evaluated on June 19, 2018 for the first time since March 20, 2018.  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  His seizures have been in rather remarkable control until recently.  He apparently had a 2 minute generalized tonic-clonic seizure on January 1, a 5 minute generalized tonic-clonic seizure at [employer], his day program.  He also had another on June 16, 2018, lasting 2 to 3 minutes in the same location.  He fell couple of times in December and once in January.  This happens when his legs give way.  It is not clear why he is falling.  There is no obvious seizure to go along with it.  He also has experienced frequent burping, which his mother calls indigestion.  I do not know whether it is the reason, but I suspect that he is swallowing air, which is causing him to burp.  We interrogated his vagal nerve stimulator and did not make any changes.  The findings are recorded below.  Procedure: interrogation of vagal nerve stimulator  The implanted device is series 102, serial [id number], implanted May 23, 2011.  On interrogation: Normal amplitude 2.50 mA, frequency 20 Hz, pulse width 250 s, stimulation duration 7 seconds, stimulation interval 0.8 minutes Magnet amplitude 2.75 mA, magnet stimulation duration 60 seconds, magnet pulse width 250 s.  Number of stimuli since last visit is 28; total number since implantation:816.  Systemdiagnostics with an amplitude of 1.18milliamps again showed intact communication, output, and impedance. DC/ DC conversion  code was 1. There is no indication to change the battery.  Review of Systems: A complete review of systems was remarkable for mom reports that patient has had four seizures since his last visit. She states that he has fallen twice. She also states that his seizures have increased and she wonders if they are coming from the VNS battery being low, all other systems reviewed and negative.  Past Medical History Diagnosis Date  . 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 Theressa Stamps. Patient's AHI was 39.6 RDI is 43.6 positional component. Patient will need desensitization and a gentle approach to CPAP titration.   . CP (cerebral palsy) (HCC)   . Fracture of femur, intertrochanteric, closed (HCC)   . Hypertension   . MR (mental retardation)   . Recurrent apnea   . Seizures (HCC)   . 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 Marland Kitchen    Hospitalizations: No., Head Injury: No., Nervous System Infections: No., Immunizations up to date: Yes.    I began to see him on July 10, 2004. He apparently was seen by me when he was eight months of age with developmental delay. Seizure activity; however, did not began until he is 14, two days after starting amitriptyline. 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 His most recent CT scan of the brain was  on September 29, 2009, and showed no significant changes.  EEG showed diffuse background slowing.   He was treated with Depakote, Topamax, and Lyrica, but suffered side effects and agitation from those medications. He has taken Trileptal, Keppra, and Vimpat for quite some time. [facility] for 6 days.for a  phase 1 evaluation. MRI of the brain showed ex-vacuo dilatation of his ventricles and cortical atrophy. Volume loss most pronounced in the parietal lobes bilaterally, temporal lobes with widening of the sylvian fissures and a paucity of white matter diffusely most pronounced again in the parietal lobes with patchy T2 hyperintensity. 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 There was no evidence of mesial temporal sclerosis.  Magnetoencephalogram showed epileptiform activity arising from the left posterior temple region with a field extending to the occipital lobe. This was not seen with concurrent routine EEG.  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 This was somewhat worse in the right than the left bilateral temporal cortex and temporal pole suggesting a longstanding insult.  Prolonged video EEG showed onset of bifrontal slowing that evolved into rhythmic activity with spikes of greater amplitude over the right central region with secondary generalization. Clinically, the patient has onset of a myoclonic jerk with his head turned to the left, movement of the left arm and leg, then tonic posturing followed by tonic-clonic generalized movements.  One day later he had bifrontal rhythmic beta range activity followed by decrement in voltage with fast beta range activity for about 30 seconds and then generalized rhythmic beta range activity. The patient had a slight head bob slowly lowered to the bed with stiffening of both upper extremities and tonic-clonic movement of both upper extremities followed by secondary generalization in his legs. A total of seven seizures were monitored. The last three were obscured by electrode artifact. Seizure onset appeared to occur from both frontal regions. This led to the recommendations to place the vagal nerve stimulator.  .  He  has frequent ear infections, urinary tract infections, constipation and a hip fracture. He has had problems with neutropenia and thrombocytopenia. He had some vomiting in June, 2010 and developed aspiration pneumonia. He tends to have a temperature of 99 in the late evening. He has had several episodes in which his left knee has been dislocated. This happened as recently as August 28, 2009. He sat down in a chair and his knee went out of place. EMS was called and he was taken to the ER. While waiting for the ER physician, Sencere stretched and his knee went back in to place. His mother said that he was subsequently seen by his orthopedist, who thinks that he may eventually need surgery on his knee.  Behavior History none  Surgical History Procedure Laterality Date  . FOOT SURGERY    . FRACTURE SURGERY    . HIP SURGERY    . IMPLANTATION VAGAL NERVE STIMULATOR    . Pediatomy  2009  . TONSILLECTOMY  2009  . TYMPANOSTOMY TUBE PLACEMENT  2009   Family History family history includes Cancer in his paternal grandmother; Diabetes in his paternal grandfather. Family history is negative for migraines, seizures, intellectual disabilities, blindness, deafness, birth defects, chromosomal disorder, or autism.  Social History Socioeconomic History  . Marital status: Single  . Years of education:  4213  . Highest  education level:  High school certificate  Occupational History  .  Unemployed due to disability  Social Needs  . Financial resource strain: Not on file  . Food insecurity:    Worry: Not on file    Inability: Not on file  . Transportation needs:    Medical: Not on file    Non-medical: Not on file  Tobacco Use  . Smoking status: Never Smoker  . Smokeless tobacco: Never Used  Substance and Sexual Activity  . Alcohol use: No  . Drug use: No  . 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     He enjoys swimming and  going to the beach.   Allergies Allergen Reactions  . Depakote [Divalproex Sodium] Other (See Comments)    Low Platelets  . Lyrica [Pregabalin] Other (See Comments)    Sleepiness  . Penicillins Hives and Rash    Has patient had a PCN reaction causing immediate rash, facial/tongue/throat swelling, SOB or lightheadedness with hypotension: Unknown Has patient had a PCN reaction causing severe rash involving mucus membranes or skin necrosis: Yes Has patient had a PCN reaction that required hospitalization: No Has patient had a PCN reaction occurring within the last 10 years: No If all of the above answers are "NO", then may proceed with Cephalosporin use.   . Topamax [Topiramate] Other (See Comments)    Weight Loss   Physical Exam BP 110/90   Pulse 64   Wt 187 lb (84.8 kg)   BMI 33.13 kg/m   General: alert, well developed, well nourished, in no acute distress, black hair, brown eyes, right handed with poor use of both hands Head: normocephalic, no dysmorphic features Ears, Nose and Throat: Otoscopic: tympanic membranes normal; pharynx: oropharynx is pink without exudates or tonsillar hypertrophy Neck: supple, full range of motion, no cranial or cervical bruits Respiratory: auscultation clear Cardiovascular: no murmurs, pulses are normal Musculoskeletal: no skeletal deformities or apparent scoliosis Skin: no rashes or neurocutaneous lesions  Neurologic Exam  Mental Status: alert; makes poor eye contact but was able to follow simple commands Cranial Nerves: visual fields are full to double simultaneous stimuli; extraocular movements are full and conjugate; pupils are round reactive to light, he has photophobia; funduscopic examination shows positive red reflex; symmetric facial strength; midline tongue and uvula; inconsistently turns to sound bilaterally Motor: diminished spastic strength worse on the left with fisting of both hands left greater than right, clumsy fine motor movements  right greater than left Sensory: withdrawal x4 Coordination: unable to test Gait and Station: broad-based spastic, diplegia gait, requires close contact for safety  Reflexes: symmetric and diminished bilaterally; no clonus; bilateral flexor plantar responses  Assessment 1. 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 2. 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 3. 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 4. 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 5. 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  Discussion I am not certain why patient's had a recent flurry of seizures after such good control.  It does not appear that we have changed anything.  His vagal nerve stimulator is working well.  He is receiving medications at the same rate and is taking and tolerating them.  Plan We will obtain lacosamide and 10-hydroxy carbamazepine levels to see if those medicines can be increased.  I ordered a new rescue medicine called Nayzilam.  This is a nasal  midazolam that has been very helpful for number of our patients and is available now in a form that makes it easier to administer.  I did not need to refill his medications today.  He will return to see me in 3 months' time.  His vagal nerve stimulator is now 29 years old and I suspect he is close to the time when it will need to be replaced.   Medication List   Accurate as of June 19, 2018 10:49 AM.    acetaminophen 500 MG tablet Commonly known as:  TYLENOL Take 500 mg by mouth every 6 (six) hours as needed for mild pain, moderate pain, fever or headache.   cetirizine 10 MG tablet Commonly known as:  ZYRTEC Take 1 tablet (10 mg total) by mouth daily as needed for allergies.   chlorthalidone 25 MG tablet Commonly known as:  HYGROTON Take 25 mg by mouth 2 (two) times daily.   cholecalciferol 1000 units  tablet Commonly known as:  VITAMIN D Take 1,000 Units by mouth daily.   DIASTAT ACUDIAL 20 MG Gel Generic drug:  diazepam Give 15 mg rectally after 2 minutes of seizure.   docusate sodium 100 MG capsule Commonly known as:  COLACE Take 200 mg by mouth daily.   KEPPRA 1000 MG tablet Generic drug:  levETIRAcetam TAKE ONE AND ONE-HALF TABLET TWICE DAILY   LORazepam 1 MG tablet Commonly known as:  ATIVAN Take 1 tablet by mouth as needed for agitation.   mupirocin ointment 2 % Commonly known as:  BACTROBAN Apply 1 application topically 2 (two) times daily. To affected skin.   OXTELLAR XR 600 MG Tb24 Generic drug:  OXcarbazepine ER TAKE 3 TABLETS AT BEDTIME   PEG 3350 Powd 17 GRAMS BY MOUTH DAILY AS NEEDED FOR MILD CONSTIPATION- CAN INCREASETO 4 CAPFULS DAILY IF NEEDED   potassium chloride 20 MEQ packet Commonly known as:  KLOR-CON Take 20 mEq by mouth 2 (two) times daily.   promethazine 25 MG suppository Commonly known as:  PHENERGAN Place 1 suppository (25 mg total) rectally every 6 (six) hours as needed for nausea or vomiting.   spironolactone 25 MG tablet Commonly known as:  ALDACTONE Take 25 mg by mouth at bedtime.   VIMPAT 100 MG Tabs Generic drug:  Lacosamide TAKE ONE TABLET IN THE MORNING AND TAKE ONE TABLET AT 3PM   VIMPAT 200 MG Tabs tablet Generic drug:  lacosamide TAKE 2 TABLETS AT BEDTIME    The medication list was reviewed and reconciled. All changes or newly prescribed medications were explained.  A complete medication list was provided to the patient/caregiver.  Deetta Perla MD

## 2018-06-26 ENCOUNTER — Telehealth (INDEPENDENT_AMBULATORY_CARE_PROVIDER_SITE_OTHER): Payer: Self-pay | Admitting: Pediatrics

## 2018-06-26 NOTE — Telephone Encounter (Signed)
I left a message for mother to call. 

## 2018-06-26 NOTE — Telephone Encounter (Signed)
Mom called back.  This was a morning trough level.  We will leave things as they are for now.  He is not showing signs of toxicity.  He had experienced some seizures at the time we saw him.  I am concerned if we drop the dose that we will see more seizures.  We have not recently changed this medication.

## 2018-06-27 LAB — 10-HYDROXYCARBAZEPINE: Triliptal/MTB(Oxcarbazepin): 48.9 ug/mL — ABNORMAL HIGH (ref 8.0–35.0)

## 2018-06-27 LAB — LACOSAMIDE, SERUM/PLASMA: LACOSAMIDE, SERUM/PLASMA: 12.3 ug/mL

## 2018-07-10 ENCOUNTER — Telehealth: Payer: Self-pay | Admitting: Pediatrics

## 2018-07-10 MED ORDER — CIPROFLOXACIN-DEXAMETHASONE 0.3-0.1 % OT SUSP: 4.0000 [drp] | Freq: Two times a day (BID) | OTIC | 6 refills | Status: DC

## 2018-07-10 NOTE — Telephone Encounter (Signed)
Mom needs a refill on ear medication antibotic and ear drops. He is in pain. [facility]

## 2018-07-10 NOTE — Telephone Encounter (Signed)
Called in ciprodex drops

## 2018-07-15 ENCOUNTER — Encounter: Payer: Self-pay | Admitting: Pediatrics

## 2018-07-15 ENCOUNTER — Ambulatory Visit (INDEPENDENT_AMBULATORY_CARE_PROVIDER_SITE_OTHER): Payer: Medicaid Other | Admitting: Pediatrics

## 2018-07-15 VITALS — Wt 187.0 lb

## 2018-07-15 DIAGNOSIS — H6501 Acute serous otitis media, right ear: Secondary | ICD-10-CM | POA: Insufficient documentation

## 2018-07-15 MED ORDER — SULFAMETHOXAZOLE-TRIMETHOPRIM 800-160 MG PO TABS: 1.0000 | ORAL_TABLET | Freq: Two times a day (BID) | ORAL | 0 refills | Status: AC

## 2018-07-15 MED ORDER — CIPROFLOXACIN-DEXAMETHASONE 0.3-0.1 % OT SUSP: 4.0000 [drp] | Freq: Two times a day (BID) | OTIC | 6 refills | Status: DC

## 2018-07-15 MED ORDER — MUPIROCIN 2 % EX OINT: TOPICAL_OINTMENT | CUTANEOUS | 2 refills | Status: AC

## 2018-07-15 NOTE — Patient Instructions (Signed)
Otitis Externa    Otitis externa is an infection of the outer ear canal. The outer ear canal is the area between the outside of the ear and the eardrum. Otitis externa is sometimes called swimmer's ear.  What are the causes?  Common causes of this condition include:   Swimming in dirty water.   Moisture in the ear.   An injury to the inside of the ear.   An object stuck in the ear.   A cut or scrape on the outside of the ear.  What increases the risk?  You are more likely to develop this condition if you go swimming often.  What are the signs or symptoms?  The first symptom of this condition is often itching in the ear. Later symptoms of the condition include:   Swelling of the ear.   Redness in the ear.   Ear pain. The pain may get worse when you pull on your ear.   Pus coming from the ear.  How is this diagnosed?  This condition may be diagnosed by examining the ear and testing fluid from the ear for bacteria and funguses.  How is this treated?  This condition may be treated with:   Antibiotic ear drops. These are often given for 10-14 days.   Medicines to reduce itching and swelling.  Follow these instructions at home:   If you were prescribed antibiotic ear drops, use them as told by your health care provider. Do not stop using the antibiotic even if your condition improves.   Take over-the-counter and prescription medicines only as told by your health care provider.   Avoid getting water in your ears as told by your health care provider. This may include avoiding swimming or water sports for a few days.   Keep all follow-up visits as told by your health care provider. This is important.  How is this prevented?   Keep your ears dry. Use the corner of a towel to dry your ears after you swim or bathe.   Avoid scratching or putting things in your ear. Doing these things can damage the ear canal or remove the protective wax that lines it, which makes it easier for bacteria and funguses to  grow.   Avoid swimming in lakes, polluted water, or pools that may not have enough chlorine.  Contact a health care provider if:   You have a fever.   Your ear is still red, swollen, painful, or draining pus after 3 days.   Your redness, swelling, or pain gets worse.   You have a severe headache.   You have redness, swelling, pain, or tenderness in the area behind your ear.  Summary   Otitis externa is an infection of the outer ear canal.   Common causes include swimming in dirty water, moisture in the ear, or a cut or scrape in the ear.   Symptoms include pain, redness, and swelling of the ear.   If you were prescribed antibiotic ear drops, use them as told by your health care provider. Do not stop using the antibiotic even if your condition improves.  This information is not intended to replace advice given to you by your health care provider. Make sure you discuss any questions you have with your health care provider.  Document Released: 05/21/2005 Document Revised: 10/25/2017 Document Reviewed: 10/25/2017  Elsevier Interactive Patient Education  2019 [employer]

## 2018-07-15 NOTE — Progress Notes (Signed)
Subjective   Zachary [name], 28 y.o. male, with history of developmental delay/autism and cerebral palsy who presents with left ear pain, congestion and irritability.  Symptoms started 2 days ago.  He is taking fluids well.  There are no other significant complaints.  The patient's history has been marked as reviewed and updated as appropriate.  Objective   Wt 187 lb (84.8 kg)   BMI 33.13 kg/m   General appearance:  well developed and well nourished and well hydrated  Nasal: Neck:  Mild nasal congestion with clear rhinorrhea Neck is supple  Ears:  External ears are normal Right TM - tympanostomy tube patent and in proper position Left TM - tympanostomy tube patent and in proper position and serous middle ear fluid  Oropharynx:  Mucous membranes are moist; there is mild erythema of the posterior pharynx  Lungs:  Lungs are clear to auscultation  Heart:  Regular rate and rhythm; no murmurs or rubs  Skin:  No rashes or lesions noted   Assessment   Acute left otitis media with TM tubes  Plan   1) Antibiotics per orders 2) Fluids, acetaminophen as needed 3) Recheck if symptoms persist for 2 or more days, symptoms worsen, or new symptoms develop.

## 2018-07-17 ENCOUNTER — Encounter: Payer: Self-pay | Admitting: Pediatrics

## 2018-09-22 ENCOUNTER — Ambulatory Visit (INDEPENDENT_AMBULATORY_CARE_PROVIDER_SITE_OTHER): Payer: Medicaid Other | Admitting: Pediatrics

## 2018-09-22 ENCOUNTER — Other Ambulatory Visit: Payer: Self-pay

## 2018-09-22 ENCOUNTER — Encounter (INDEPENDENT_AMBULATORY_CARE_PROVIDER_SITE_OTHER): Payer: Self-pay | Admitting: Pediatrics

## 2018-09-22 DIAGNOSIS — G40209 Localization-related (focal) (partial) symptomatic epilepsy and epileptic syndromes with complex partial seizures, not intractable, without status epilepticus: Secondary | ICD-10-CM

## 2018-09-22 DIAGNOSIS — F840 Autistic disorder: Secondary | ICD-10-CM | POA: Diagnosis not present

## 2018-09-22 DIAGNOSIS — R451 Restlessness and agitation: Secondary | ICD-10-CM | POA: Diagnosis not present

## 2018-09-22 MED ORDER — VIMPAT 200 MG PO TABS: 400.0000 mg | ORAL_TABLET | Freq: Every day | ORAL | 5 refills | Status: DC

## 2018-09-22 MED ORDER — KEPPRA 1000 MG PO TABS: ORAL_TABLET | ORAL | 5 refills | Status: DC

## 2018-09-22 MED ORDER — LORAZEPAM 1 MG PO TABS: ORAL_TABLET | ORAL | 5 refills | Status: DC

## 2018-09-22 MED ORDER — VIMPAT 100 MG PO TABS: ORAL_TABLET | ORAL | 5 refills | Status: DC

## 2018-09-22 MED ORDER — OXTELLAR XR 600 MG PO TB24: 3.0000 | ORAL_TABLET | Freq: Every day | ORAL | 5 refills | Status: DC

## 2018-09-22 NOTE — Patient Instructions (Signed)
It was good to talk to you today.  I am glad that he is doing so well.  I understand his agitation.  It is very difficult to have the schedule completely changed and he is used to being it [employer] rather than home with you.  I am going to send prescriptions for all of his medications.  Let me know if there is a problem.

## 2018-09-22 NOTE — Progress Notes (Signed)
This is a Pediatric Specialist E-Visit follow up consult provided via Telephone Zachary [name] and their parent/guardian Zachary [name] consented to an E-Visit consult today.  Location of patient: Zachary [name] is at home Location of provider: Ellison Carwin, MD is in office Patient was referred by Zachary Hahn, MD   The following participants were involved in this E-Visit: mom, CMA, provider  Chief Complain/ Reason for E-Visit today: Seizures Total time on call: 8 minutes Follow up: 3 months    Patient: Zachary [name] MRN: [id number] Sex: male DOB: 01/23/1991  Provider: Ellison Carwin, MD Location of Care: [facility]  Note type: Routine return visit  History of Present Illness: Referral Source: Zachary Hahn, MD History from: mother and [facility] chart Chief Complaint: VNS/Epilepsy  Zachary [name] is a 28 y.o. male who was evaluated September 22, 2018 for the first time since March 20, 2018.  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  He is able to ambulate for short distances, but is largely wheelchair-bound.  He is dependent on his mother for all phases of care.  He is only had a single brief 30 second seizure since he was last seen June 19, 2018.  He had staring started to jerk and posture in the episodes stopped when the vagal nerve stimulator was swiped.  There have been no falls that his mother recalled.  His general health is good.  He has been sleeping well.  His appetite is good.  He takes and tolerates his antiepileptic medication.  Typically we evaluate his vagal nerve stimulator but were unable to do so because he mistakenly was scheduled for an offsite evaluation.  This was done without my knowledge or consent.  His mother is fine with that because things have been going so well.  Review of Systems: A complete review of systems was assessed and was negative  except as noted above.  Past Medical History Diagnosis Date   Complex sleep apnea syndrome 02/11/2014   Diagnosed on 12-20-48 upon referral by Dr. Theressa Stamps. Patient's AHI was 39.6 RDI is 43.6 positional component. Patient will need desensitization and a gentle approach to CPAP titration.    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 Marland Kitchen    Hospitalizations: No., Head Injury: No., Nervous System Infections: No., Immunizations up to date: Yes.    I began to see him on July 10, 2004. He apparently was seen by me when he was eight months of age with developmental delay. Seizure activity; however, did not began until he is 14, two days after starting amitriptyline. 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 His most recent CT scan of the brain was on September 29, 2009, and showed no significant changes.  EEG showed diffuse background slowing.   He was treated with Depakote, Topamax, and Lyrica, but suffered side effects and agitation from those medications. He has taken Trileptal, Keppra, and Vimpat for quite some time. He was seen at [facility] EMU on Oct 27, 2010 for 6 days.for a phase 1 evaluation. MRI of the brain showed ex-vacuo dilatation of his ventricles and cortical atrophy. Volume loss most pronounced in the parietal lobes bilaterally, temporal lobes with widening of the sylvian fissures and a paucity of white matter diffusely most pronounced again in the parietal lobes with patchy T2 hyperintensity. 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 There was no evidence of mesial temporal sclerosis.  Magnetoencephalogram showed epileptiform activity arising from the left posterior temple region with a field extending to the occipital lobe. This was not seen with concurrent routine EEG.  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 This was somewhat worse in the right than the left bilateral temporal cortex and temporal pole suggesting a longstanding insult.  Prolonged video EEG showed onset of bifrontal slowing that evolved into rhythmic activity with spikes of greater amplitude over the right central region with secondary generalization. Clinically, the patient has onset of a myoclonic jerk with his head turned to the left, movement of the left arm and leg, then tonic posturing followed by tonic-clonic generalized movements.  One day later he had bifrontal rhythmic beta range activity followed by decrement in voltage with fast beta range activity for about 30 seconds and then generalized rhythmic beta range activity. The patient had a slight head bob slowly lowered to the bed with stiffening of both upper extremities and tonic-clonic movement of both upper extremities followed by secondary generalization in his legs. A total of seven seizures were monitored. The last three were obscured by electrode artifact. Seizure onset appeared to occur from both frontal regions. This led to the recommendations to place the vagal nerve stimulator.  .  He has frequent ear infections, urinary tract infections, constipation and a hip fracture. He has had problems with neutropenia and thrombocytopenia. He had some vomiting in June, 2010 and developed aspiration pneumonia. He tends to have a temperature of 99 in the late evening. He has had several episodes in which his left knee has been dislocated. This happened as  recently as August 28, 2009. He sat down in a chair and his knee went out of place. EMS was called and he was taken to the ER. While waiting for the ER physician, Jemiah stretched and his knee went back in to place. His mother said that he was subsequently seen by his orthopedist, who thinks that he may eventually need surgery on his knee.  Behavior History none  Surgical History Past Surgical History:  Procedure Laterality Date   FOOT SURGERY     FRACTURE SURGERY     HIP SURGERY     IMPLANTATION VAGAL NERVE STIMULATOR     Pediatomy  2009   TONSILLECTOMY  2009   TYMPANOSTOMY TUBE PLACEMENT  2009    Family History family history includes Cancer in his paternal grandmother; Diabetes in his paternal grandfather. Family history is negative for migraines, seizures, intellectual disabilities, blindness, deafness, birth defects, chromosomal disorder, or autism.  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     He enjoys swimming and going to the beach.   Allergies Allergen Reactions   Depakote [Divalproex Sodium] Other (See Comments)    Low Platelets   Lyrica [Pregabalin] Other (See Comments)    Sleepiness   Penicillins Hives and Rash    Has patient had a PCN reaction causing immediate rash, facial/tongue/throat swelling, SOB or lightheadedness  with hypotension: Unknown Has patient had a PCN reaction causing severe rash involving mucus membranes or skin necrosis: Yes Has patient had a PCN reaction that required hospitalization: No Has patient had a PCN reaction occurring within the last 10 years: No If all of the above answers are "NO", then may proceed with Cephalosporin use.    Topamax [Topiramate] Other (See Comments)    Weight Loss   Physical Exam There were no vitals taken for this visit.  I did not examine him because this was a telephone call.  Assessment 1.  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 2.  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 3.  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 4.  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 5.  Moderate intellectual disability, F71.  Discussion Deaundra is done extremely well since his last visit.  This is one of the best 3 month pereiods that he had in a long time.  There is no reason to change his medication.  I will refill those medicines that need refills.  Plan Return visit in 3 months time with a mandatory office visit so that we can check his vagal nerve stimulator.   Medication List   Accurate as of September 22, 2018  9:55 AM.    acetaminophen 500 MG tablet Commonly known as:  TYLENOL Take 500 mg by mouth every 6 (six) hours as needed for mild pain, moderate pain, fever or headache.   cetirizine 10 MG tablet Commonly known as:  ZYRTEC Take 1 tablet (10 mg total) by mouth daily as needed for allergies.   chlorthalidone 25 MG tablet Commonly known as:  HYGROTON Take 25 mg by mouth 2 (two) times daily.   cholecalciferol 1000 units tablet Commonly known as:  VITAMIN D Take 1,000 Units by mouth daily.   Diastat AcuDial 20 MG Gel Generic drug:  diazepam Give 15 mg rectally after 2 minutes of seizure.   docusate sodium 100 MG  capsule Commonly known as:  COLACE Take 200 mg by mouth daily.   Keppra 1000 MG tablet Generic drug:  levETIRAcetam TAKE ONE AND ONE-HALF TABLET TWICE DAILY   LORazepam 1 MG tablet Commonly known as:  ATIVAN Take 1 tablet by mouth as needed for agitation.   Nayzilam 5 MG/0.1ML Soln Generic drug:  Midazolam Place 5 mg into the nose once for 1 dose. If seizures persist may repeat in 5 minutes   Oxtellar XR 600 MG Tb24 Generic drug:  OXcarbazepine ER TAKE 3 TABLETS AT BEDTIME   PEG 3350 Powd 17 GRAMS BY MOUTH DAILY AS NEEDED FOR MILD CONSTIPATION- CAN INCREASETO 4 CAPFULS DAILY IF NEEDED   potassium chloride 20 MEQ  packet Commonly known as:  KLOR-CON Take 20 mEq by mouth 2 (two) times daily.   promethazine 25 MG suppository Commonly known as:  Phenergan Place 1 suppository (25 mg total) rectally every 6 (six) hours as needed for nausea or vomiting.   spironolactone 25 MG tablet Commonly known as:  ALDACTONE Take 25 mg by mouth at bedtime.   Vimpat 100 MG Tabs Generic drug:  Lacosamide TAKE ONE TABLET IN THE MORNING AND TAKE ONE TABLET AT 3PM   Vimpat 200 MG Tabs tablet Generic drug:  lacosamide TAKE 2 TABLETS AT BEDTIME    The medication list was reviewed and reconciled. All changes or newly prescribed medications were explained.  A complete medication list was provided to the patient/caregiver.  Deetta PerlaWilliam H Rosebud Koenen MD

## 2018-09-30 ENCOUNTER — Telehealth (INDEPENDENT_AMBULATORY_CARE_PROVIDER_SITE_OTHER): Payer: Self-pay | Admitting: Pediatrics

## 2018-09-30 NOTE — Telephone Encounter (Signed)
°  Who's calling (name and relationship to patient) : Archie Patten (Mother)  Best contact number: [phone] Provider they see: Dr. Sharene Skeans  Reason for call: Mother stated that Dr. Sharene Skeans was going to send rxs to pt's pharmacy last week. Mom stated the rxs are not there. Mom wanted to follow up and have Dr. Sharene Skeans resend them if need be.   [facility]

## 2018-09-30 NOTE — Telephone Encounter (Signed)
Mom stated that the pharmacy called her a few moments ago and stated that they do have the rxs.

## 2018-10-01 ENCOUNTER — Other Ambulatory Visit: Payer: Self-pay

## 2018-10-01 ENCOUNTER — Ambulatory Visit (INDEPENDENT_AMBULATORY_CARE_PROVIDER_SITE_OTHER): Payer: Medicaid Other | Admitting: Pediatrics

## 2018-10-01 ENCOUNTER — Encounter (INDEPENDENT_AMBULATORY_CARE_PROVIDER_SITE_OTHER): Payer: Self-pay | Admitting: Pediatrics

## 2018-10-01 ENCOUNTER — Encounter: Payer: Self-pay | Admitting: Pediatrics

## 2018-10-01 VITALS — BP 110/70 | HR 76 | Wt 187.0 lb

## 2018-10-01 DIAGNOSIS — G40319 Generalized idiopathic epilepsy and epileptic syndromes, intractable, without status epilepticus: Secondary | ICD-10-CM

## 2018-10-01 DIAGNOSIS — G40209 Localization-related (focal) (partial) symptomatic epilepsy and epileptic syndromes with complex partial seizures, not intractable, without status epilepticus: Secondary | ICD-10-CM | POA: Diagnosis not present

## 2018-10-01 DIAGNOSIS — F840 Autistic disorder: Secondary | ICD-10-CM

## 2018-10-01 DIAGNOSIS — G808 Other cerebral palsy: Secondary | ICD-10-CM

## 2018-10-01 DIAGNOSIS — G4731 Primary central sleep apnea: Secondary | ICD-10-CM

## 2018-10-01 NOTE — Patient Instructions (Signed)
I am happy that Zachary [name] is doing well.  The vagal nerve stimulator is still working well.  We need to see him again in 3 months in the office so that I can reprogram and assess him.  Please get up with me if there are any other concerns between now and then.

## 2018-10-01 NOTE — Progress Notes (Addendum)
Patient: Zachary [name] MRN: [id number] Sex: male DOB: [birth year]  Provider: Ellison Carwin, MD Location of Care: [facility]  Note type: Routine return visit  History of Present Illness: Referral Source: Zachary Hahn, MD History from: mother and [facility] chart Chief Complaint: VNS/Epilepsy  Zachary [name] is a 28 y.o. male who returns on October 01, 2018 for the first time since a virtual office visit on September 22, 2018.  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  He has moderate intellectual disability, spastic diplegia that limits his ability to walk safely for distances.  He is largely wheelchair bound.  Unfortunately, with the virtual visit, I was unable to interrogate his vagal nerve stimulator.  I requested that the family return today because he was implanted in May 23, 2011, and I am worried that we are getting toward end of service.  It is a Financial risk analyst of mine to check vagal nerve stimulators every 3 months in this circumstance and it has been since June 19, 2018.  Procedure: interrogation of vagal nerve stimulator  The implanted device is series 102, serial [id number], implanted May 23, 2011.  On interrogation:Normalamplitude 2.50 mA, frequency 20 Hz, pulse width 250 s, stimulation duration 7 seconds, stimulation interval 0.8 minutes Magnetamplitude 2.75 mA, magnet stimulation duration 60 seconds, magnet pulse width 250 s.  Number of stimuli since last interrogation is30; total number since implantation:846.  Systemdiagnostics with an amplitude of 1.17milliamps again showed intact communication, output, and impedance. DC/ DC conversion code was 1. There is no indication to change the battery.  Review of Systems: A complete review of systems was assessed and was negative.  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 Theressa Stamps. Patient's AHI was 39.6 RDI is 43.6 positional component. Patient will need desensitization and a gentle approach to CPAP titration.    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 Marland Kitchen    Hospitalizations: No., Head Injury: No., Nervous System Infections: No., Immunizations up to date: Yes.    Copied from prior chart I began to see him on July 10, 2004. He apparently was seen by me when he was eight months of age with developmental delay. Seizure activity; however, did not began until he is 14, two days after starting amitriptyline. 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 His most recent CT scan of the brain was on September 29, 2009, and showed no significant changes.  EEG showed diffuse background slowing.   He was treated with Depakote, Topamax, and Lyrica, but suffered side effects and agitation from those medications. He has taken Trileptal, Keppra, and Vimpat for quite some time. He was seen at [facility] EMU on Oct 27, 2010 for 6 days.for a phase 1 evaluation. MRI of the brain showed ex-vacuo dilatation of his ventricles and cortical atrophy. Volume loss most pronounced in the  parietal lobes bilaterally, temporal lobes with widening of the sylvian fissures and a paucity of white matter diffusely most pronounced again in the parietal lobes with patchy T2 hyperintensity. 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 There was no  evidence of mesial temporal sclerosis.  Magnetoencephalogram showed epileptiform activity arising from the left posterior temple region with a field extending to the occipital lobe. This was not seen with concurrent routine EEG.  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 This was somewhat worse in the right than the left bilateral temporal cortex and temporal pole suggesting a longstanding insult.  Prolonged video EEG showed onset of bifrontal slowing that evolved into rhythmic activity with spikes of greater amplitude over the right central region with secondary generalization. Clinically, the patient has onset of a myoclonic jerk with his head turned to the left, movement of the left arm and leg, then tonic posturing followed by tonic-clonic generalized movements.  One day later he had bifrontal rhythmic beta range activity followed by decrement in voltage with fast beta range activity for about 30 seconds and then generalized rhythmic beta range activity. The patient had a slight head bob slowly lowered to the bed with stiffening of both upper extremities and tonic-clonic movement of both upper extremities followed by secondary generalization in his legs. A total of seven seizures were monitored. The last three were obscured by electrode artifact. Seizure onset appeared to occur from both frontal regions. This led to the recommendations to place the vagal nerve stimulator.  .  He has frequent ear infections, urinary tract infections, constipation and a hip fracture. He has had problems with neutropenia and thrombocytopenia. He had some vomiting in June, 2010 and developed aspiration pneumonia. He tends to have a temperature of 99 in the late evening. He has had several episodes in which his left knee has been dislocated. This happened as recently as August 28, 2009. He sat down  in a chair and his knee went out of place. EMS was called and he was taken to the ER. While waiting for the ER physician, Zachary [name] stretched and his knee went back in to place. His mother said that he was subsequently seen by his orthopedist, who thinks that he may eventually need surgery on his knee.  Behavior History none  Surgical History Procedure Laterality Date   FOOT SURGERY     FRACTURE SURGERY     HIP SURGERY     IMPLANTATION VAGAL NERVE STIMULATOR     Pediatomy  2009   TONSILLECTOMY  2009   TYMPANOSTOMY TUBE PLACEMENT  2009   Family History family history includes Cancer in his paternal grandmother; Diabetes in his paternal grandfather. Family history is negative for migraines, seizures, intellectual disabilities, blindness, deafness, birth defects, chromosomal disorder, or autism.  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     He enjoys swimming and going to the beach.   Allergies Allergen Reactions   Depakote [Divalproex Sodium] Other (See Comments)    Low Platelets   Lyrica [Pregabalin] Other (See Comments)    Sleepiness   Penicillins Hives and Rash    Has patient had a PCN reaction causing immediate rash, facial/tongue/throat swelling, SOB or lightheadedness with hypotension: Unknown Has patient had a PCN reaction causing severe  rash involving mucus membranes or skin necrosis: Yes Has patient had a PCN reaction that required hospitalization: No Has patient had a PCN reaction occurring within the last 10 years: No If all of the above answers are "NO", then may proceed with Cephalosporin use.    Topamax [Topiramate] Other (See Comments)    Weight Loss   Physical Exam BP 110/70    Pulse 76    Wt 187 lb (84.8 kg)    BMI 33.13 kg/m   Not examined today  Assessment 1. 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 2. Complex partial seizures evolving the generalized tonic-clonic seizures, G40.209. 3. 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 4. Complex sleep apnea syndrome, G47.31. 5. 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  Discussion I am pleased that the patient is doing well.  There is no reason to make any changes in his medication or his vagal nerve stimulator.  The stimulator currently is working well.    Plan He will return to see me in 3 months' time.  I will see him sooner based on clinical need.  I emphasized that his visits had to be office visit because of the vagal nerve stimulator and his family understands.   Medication List   Accurate as of October 01, 2018 10:46 AM.    acetaminophen 500 MG tablet Commonly known as:  TYLENOL Take 500 mg by mouth every 6 (six) hours as needed for mild pain, moderate pain, fever or headache.   cetirizine 10 MG tablet Commonly known as:  ZYRTEC Take 1 tablet (10 mg total) by mouth daily as needed for allergies.   chlorthalidone 25 MG tablet Commonly known as:  HYGROTON Take 25 mg by mouth 2 (two) times daily.   cholecalciferol 1000 units tablet Commonly known as:  VITAMIN D Take 1,000 Units by mouth daily.   Diastat AcuDial 20 MG Gel Generic drug:  diazepam Give 15 mg rectally after 2 minutes of seizure.   docusate sodium 100 MG capsule Commonly known as:   COLACE Take 200 mg by mouth daily.   Keppra 1000 MG tablet Generic drug:  levETIRAcetam TAKE ONE AND ONE-HALF TABLET TWICE DAILY   LORazepam 1 MG tablet Commonly known as:  ATIVAN Take 1 tablet by mouth as needed for agitation.   Nayzilam 5 MG/0.1ML Soln Generic drug:  Midazolam Place 5 mg into the nose once for 1 dose. If seizures persist may repeat in 5 minutes   Oxtellar XR 600 MG Tb24 Generic drug:  OXcarbazepine ER Take 3 tablets by mouth at bedtime.   PEG 3350 Powd 17 GRAMS BY MOUTH DAILY AS NEEDED FOR MILD CONSTIPATION- CAN INCREASETO 4 CAPFULS DAILY IF NEEDED   potassium chloride 20 MEQ packet Commonly known as:  KLOR-CON Take 20 mEq by mouth 2 (two) times daily.   promethazine 25  MG suppository Commonly known as:  Phenergan Place 1 suppository (25 mg total) rectally every 6 (six) hours as needed for nausea or vomiting.   spironolactone 25 MG tablet Commonly known as:  ALDACTONE Take 25 mg by mouth at bedtime.   Vimpat 100 MG Tabs Generic drug:  Lacosamide Take 1 tablet in the morning and 1 tablet at 3 PM   Vimpat 200 MG Tabs tablet Generic drug:  lacosamide Take 2 tablets (400 mg total) by mouth at bedtime.    The medication list was reviewed and reconciled. All changes or newly prescribed medications were explained.  A complete medication list was provided to the patient/caregiver.  Deetta Perla MD

## 2018-11-17 ENCOUNTER — Encounter: Payer: Self-pay | Admitting: Pediatrics

## 2018-11-17 ENCOUNTER — Other Ambulatory Visit: Payer: Self-pay

## 2018-11-17 ENCOUNTER — Ambulatory Visit (INDEPENDENT_AMBULATORY_CARE_PROVIDER_SITE_OTHER): Payer: Medicaid Other | Admitting: Pediatrics

## 2018-11-17 ENCOUNTER — Telehealth (INDEPENDENT_AMBULATORY_CARE_PROVIDER_SITE_OTHER): Payer: Self-pay | Admitting: Pediatrics

## 2018-11-17 VITALS — Wt 184.0 lb

## 2018-11-17 DIAGNOSIS — H6506 Acute serous otitis media, recurrent, bilateral: Secondary | ICD-10-CM | POA: Diagnosis not present

## 2018-11-17 MED ORDER — SULFAMETHOXAZOLE-TRIMETHOPRIM 800-160 MG PO TABS: 1.0000 | ORAL_TABLET | Freq: Two times a day (BID) | ORAL | 0 refills | Status: AC

## 2018-11-17 MED ORDER — CIPRODEX 0.3-0.1 % OT SUSP: 4.0000 [drp] | Freq: Two times a day (BID) | OTIC | 0 refills | Status: AC

## 2018-11-17 NOTE — Telephone Encounter (Signed)
I called and talked to Zachary [name]. She said that yesterday Zachary [name] was very agitated and hitting himself in the head. She said that she gave him the Lorazepam and then gave him Melatonin because she could not get him calmed down. She put ice packs around his face and head and he eventually calmed down and went to sleep for awhile. He was seen by PCP today and has ear infection. Zachary [name] wanted to know how often the Lorazepam could be given and if the dose could increase if he was that agitated in the future. I told Zachary [name] that the agitation from yesterday was likely from ear pain and that is why it didn't work as well as it has in the past. I told Zachary [name] that she could give Zachary [name] 1+1/2 tablets if he was very agitated, up to 3 times in 1 day. I instructed her to call if he didn't calm with this dose. Zachary [name] agreed with this plan. TG

## 2018-11-17 NOTE — Telephone Encounter (Signed)
°  Who's calling (name and relationship to patient) : Earley Brooke - Mother   Best contact number: [phone]  Provider they see: Dr Gaynell Face   Reason for call: Mom called stating that the Lorazepam 1MG  tablet did not help Zachary [name] at all yesterday. He has had an ear infection and they have gotten the medication needed for the ear infection from his PCP. Mom advised that he was banging his head against the wall and getting worked up even with the Lorazepam. She would like to know what she can do next time to keep him calm or if his dose needs to be upped some for the next time he is sick. Please advise     PRESCRIPTION REFILL ONLY  Name of prescription:  Pharmacy:

## 2018-11-17 NOTE — Patient Instructions (Signed)
Otitis Media With Effusion, Pediatric    Otitis media with effusion (OME) occurs when there is inflammation of the middle ear and fluid in the middle ear space. There are no signs and symptoms of infection. The middle ear space contains air and the bones for hearing. Air in the middle ear space helps to transmit sound to the brain.  OME is a common condition in children, and it often occurs after an ear infection. This condition may be present for several weeks or longer after an ear infection. Most cases of this condition get better on their own.  What are the causes?  OME is caused by a blockage of the eustachian tube in one or both ears. These tubes drain fluid in the ears to the back of the nose (nasopharynx). If the tissue in the tube swells up (edema), the tube closes. This prevents fluid from draining. Blockage can be caused by:  · Ear infections.  · Colds and other upper respiratory infections.  · Allergies.  · Irritants, such as tobacco smoke.  · Enlarged adenoids. The adenoids are areas of soft tissue located high in the back of the throat, behind the nose and the roof of the mouth. They are part of the body’s natural defense (immune) system.  · A mass in the nasopharynx.  · Damage to the ear caused by pressure changes (barotrauma).  What increases the risk?  Your child is more likely to develop this condition if:  · He or she has repeated ear and sinus infections.  · He or she has allergies.  · He or she is exposed to tobacco smoke.  · He or she attends daycare.  · He or she is not breastfed.  What are the signs or symptoms?  Symptoms of this condition may not be obvious. Sometimes this condition does not have any symptoms, or symptoms may overlap with those of a cold or upper respiratory tract illness.  Symptoms of this condition include:  · Temporary hearing loss.  · A feeling of fullness in the ear without pain.  · Irritability or agitation.  · Balance (vestibular) problems.  As a result of hearing  loss, your child may:  · Listen to the TV at a loud volume.  · Not respond to questions.  · Ask "What?" often when spoken to.  · Mistake or confuse one sound or word for another.  · Perform poorly at school.  · Have a poor attention span.  · Become agitated or irritated easily.  How is this diagnosed?  This condition is diagnosed with an ear exam. Your child's health care provider will look inside your child's ear with an instrument (otoscope) to check for redness, swelling, and fluid.  Other tests may be done, including:  · A test to check the movement of the eardrum (pneumatic otoscopy). This is done by squeezing a small amount of air into the ear.  · A test that changes air pressure in the middle ear to check how well the eardrum moves and to see if the eustachian tube is working (tympanogram).  · Hearing test (audiogram). This test involves playing tones at different pitches to see if your child can hear each tone.  How is this treated?  Treatment for this condition depends on the cause. In many cases, the fluid goes away on its own.  In some cases, your child may need a procedure to create a hole in the eardrum to allow fluid to drain (myringotomy) and to   insert small drainage tubes (tympanostomy tubes) into the eardrums. These tubes help to drain fluid and prevent infection. This procedure may be recommended if:  · OME does not get better over several months.  · Your child has many ear infections within several months.  · Your child has noticeable hearing loss.  · Your child has problems with speech and language development.  Surgery may also be done to remove the adenoids (adenoidectomy).  Follow these instructions at home:  · Give over-the-counter and prescription medicines only as told by your child's health care provider.  · Keep children away from any tobacco smoke.  · Keep all follow-up visits as told by your child's health care provider. This is important.  How is this prevented?  · Keep your child's  vaccinations up to date. Make sure your child gets all recommended vaccinations, including a pneumonia and flu vaccine.  · Encourage hand washing. Your child should wash his or her hands often with soap and water. If there is no soap and water, he or she should use hand sanitizer.  · Avoid exposing your child to tobacco smoke.  · Breastfeed your baby, if possible. Babies who are breastfed as long as possible are less likely to develop this condition.  Contact a health care provider if:  · Your child's hearing does not get better after 3 months.  · Your child's hearing is worse.  · Your child has ear pain.  · Your child has a fever.  · Your child has drainage from the ear.  · Your child is dizzy.  · Your child has a lump on his or her neck.  Get help right away if:  · Your child has bleeding from the nose.  · Your child cannot move part of her or his face.  · Your child has trouble breathing.  · Your child cannot smell.  · Your child develops severe congestion.  · Your child develops weakness.  · Your child who is younger than 3 months has a temperature of 100°F (38°C) or higher.  Summary  · Otitis media with effusion (OME) occurs when there is inflammation of the middle ear and fluid in the middle ear space.  · This condition is caused by blockage of one or both eustachian tubes, which drain fluid in the ears to the back of the nose.  · Symptoms of this condition can include temporary hearing loss, a feeling of fullness in the ear, irritability or agitation, and balance (vestibular) problems. Sometimes, there are no symptoms.  · This condition is diagnosed with an ear exam and tests, such as pneumatic otoscopy, tympanogram, and audiogram.  · Treatment for this condition depends on the cause. In many cases, the fluid goes away on its own.  This information is not intended to replace advice given to you by your health care provider. Make sure you discuss any questions you have with your health care provider.  Document  Released: 08/11/2003 Document Revised: 04/12/2016 Document Reviewed: 04/12/2016  Elsevier Interactive Patient Education © 2019 [employer]

## 2018-11-17 NOTE — Progress Notes (Signed)
Subjective:    Sladen is a 28 y.o. old male here with his mother for Otalgia   HPI: Keoni presents with history of chronic ear infections and tubes placed.  He is nonverbal so it is hard to tell when something is bothering him.  About 2 weeks has been hitting his left ear and then now having some on the right.  Has not taken any antibiotics yet but usually will as this is he typical presentation.  Mom has checked that the ear looks a little red.  Had a seizure yesterday.  Yesterday he was beating his head constantly.  Also having some issues when he is eating and will beat the side of his jaw when he chews.  He has not been back to the ENT to look at ears again.  He has taken bactrim in the past and mostly helps.  He has also had a mild cough for about 1 week that is worse when he lays down and sounds a little wet.  Denies any retractions or wheezing.  Denies any fevers, breathing issues, v/d, lethargy.      The following portions of the patient's history were reviewed and updated as appropriate: allergies, current medications, past family history, past medical history, past social history, past surgical history and problem list.  Review of Systems Pertinent items are noted in HPI.   Allergies: Allergies  Allergen Reactions  . Depakote [Divalproex Sodium] Other (See Comments)    Low Platelets  . Lyrica [Pregabalin] Other (See Comments)    Sleepiness  . Penicillins Hives and Rash    Has patient had a PCN reaction causing immediate rash, facial/tongue/throat swelling, SOB or lightheadedness with hypotension: Unknown Has patient had a PCN reaction causing severe rash involving mucus membranes or skin necrosis: Yes Has patient had a PCN reaction that required hospitalization: No Has patient had a PCN reaction occurring within the last 10 years: No If all of the above answers are "NO", then may proceed with Cephalosporin use.   . Topamax [Topiramate] Other (See Comments)    Weight Loss      Current Outpatient Medications on File Prior to Visit  Medication Sig Dispense Refill  . acetaminophen (TYLENOL) 500 MG tablet Take 500 mg by mouth every 6 (six) hours as needed for mild pain, moderate pain, fever or headache.    . cetirizine (ZYRTEC) 10 MG tablet Take 1 tablet (10 mg total) by mouth daily as needed for allergies. 30 tablet 12  . chlorthalidone (HYGROTON) 25 MG tablet Take 25 mg by mouth 2 (two) times daily.    . cholecalciferol (VITAMIN D) 1000 units tablet Take 1,000 Units by mouth daily.    Marland Kitchen DIASTAT ACUDIAL 20 MG GEL Give 15 mg rectally after 2 minutes of seizure. 2 Package 5  . docusate sodium (COLACE) 100 MG capsule Take 200 mg by mouth daily.    Marland Kitchen KEPPRA 1000 MG tablet TAKE ONE AND ONE-HALF TABLET TWICE DAILY 93 tablet 5  . LORazepam (ATIVAN) 1 MG tablet Take 1 tablet by mouth as needed for agitation. 30 tablet 5  . NAYZILAM 5 MG/0.1ML SOLN Place 5 mg into the nose once for 1 dose. If seizures persist may repeat in 5 minutes 2 each 5  . OXTELLAR XR 600 MG TB24 Take 3 tablets by mouth at bedtime. 93 tablet 5  . Polyethylene Glycol 3350 (PEG 3350) POWD 17 GRAMS BY MOUTH DAILY AS NEEDED FOR MILD CONSTIPATION- CAN INCREASETO 4 CAPFULS DAILY IF NEEDED 527 g  3  . potassium chloride (KLOR-CON) 20 MEQ packet Take 20 mEq by mouth 2 (two) times daily.    . promethazine (PHENERGAN) 25 MG suppository Place 1 suppository (25 mg total) rectally every 6 (six) hours as needed for nausea or vomiting. 21 suppository 3  . spironolactone (ALDACTONE) 25 MG tablet Take 25 mg by mouth at bedtime.    Marland Kitchen. VIMPAT 100 MG TABS Take 1 tablet in the morning and 1 tablet at 3 PM 62 each 5  . VIMPAT 200 MG TABS tablet Take 2 tablets (400 mg total) by mouth at bedtime. 62 each 5   No current facility-administered medications on file prior to visit.     History and Problem List: Past Medical History:  Diagnosis Date  . 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 Theressa StampsJames Crowe. Patient's AHI was 39.6 RDI is 43.6 positional component. Patient will need desensitization and a gentle approach to CPAP titration.   . CP (cerebral palsy) (HCC)   . Fracture of femur, intertrochanteric, closed (HCC)   . Hypertension   . MR (mental retardation)   . Recurrent apnea   . Seizures (HCC)   . 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 .         Objective:    Wt 184 lb (83.5 kg)   BMI 32.59 kg/m   General: alert, active, cooperative, non toxic, non verbal, some bouts of loud noises but seems consolable with mom ENT: oropharynx moist, no lesions, nares no discharge Eye:  PERRL, EOMI, conjunctivae clear, no discharge Ears: bilateral serous fluid from tubes with mild erythema to external canal, no discharge Neck: supple, no sig LAD Lungs: clear to auscultation, no wheeze, crackles or retractions Heart: RRR, Nl S1, S2, no murmurs Skin: no rashes Neuro: normal mental status, No focal deficits  No results found for this or any previous visit (from the past 72 hour(s)).     Assessment:   Jonny RuizJohn is a 28 y.o. old male with  1. Recurrent acute serous otitis media of both ears     Plan:   1.  Long history of Otitis.  Has improved in past with bactrim.  Mom to continue ciprodex and start bactrim.  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  Motrin prn for pain.  Mom to discuss with ENT possible TMJ dysfunction with his history of slapping the side of his jaws during meals and with chewing.  Also would suggest she talk with Neurology about he seizures as mom had questions about his dosage of ativan and if he needed an increase.     Meds ordered this encounter  Medications  . sulfamethoxazole-trimethoprim (BACTRIM DS) 800-160 MG tablet    Sig: Take 1 tablet by mouth 2 (two) times daily for 10 days.    Dispense:  20 tablet     Refill:  0  . CIPRODEX OTIC suspension    Sig: Place 4 drops into both ears 2 (two) times daily for 10 days.    Dispense:  7.5 mL    Refill:  0     Return if symptoms worsen or fail to improve. in 2-3 days or prior for concerns  Myles GipPerry Scott Jermy Couper, DO

## 2018-12-04 ENCOUNTER — Encounter: Payer: Self-pay | Admitting: Pediatrics

## 2018-12-25 ENCOUNTER — Encounter (INDEPENDENT_AMBULATORY_CARE_PROVIDER_SITE_OTHER): Payer: Self-pay | Admitting: Pediatrics

## 2018-12-25 ENCOUNTER — Ambulatory Visit (INDEPENDENT_AMBULATORY_CARE_PROVIDER_SITE_OTHER): Payer: Medicaid Other | Admitting: Pediatrics

## 2018-12-25 ENCOUNTER — Other Ambulatory Visit: Payer: Self-pay

## 2018-12-25 VITALS — BP 120/90 | HR 92 | Wt 174.8 lb

## 2018-12-25 DIAGNOSIS — G808 Other cerebral palsy: Secondary | ICD-10-CM

## 2018-12-25 DIAGNOSIS — F71 Moderate intellectual disabilities: Secondary | ICD-10-CM

## 2018-12-25 DIAGNOSIS — G40319 Generalized idiopathic epilepsy and epileptic syndromes, intractable, without status epilepticus: Secondary | ICD-10-CM

## 2018-12-25 DIAGNOSIS — G40209 Localization-related (focal) (partial) symptomatic epilepsy and epileptic syndromes with complex partial seizures, not intractable, without status epilepticus: Secondary | ICD-10-CM

## 2018-12-25 DIAGNOSIS — F840 Autistic disorder: Secondary | ICD-10-CM

## 2018-12-25 NOTE — Patient Instructions (Signed)
I am glad that things are stable.  I am also glad that Arinze has lost weight.  I am not going to make changes in his current medications.  His vagal nerve stimulator continues to work 7-1/2 years after it was implanted.  At some point the battery is going to fail and we will have to replace it.  I would like to see you in 3 months.  Let me know if there is any problem in the interim.  It is highly unlikely that the device would fail between now and when I see him.

## 2018-12-25 NOTE — Progress Notes (Signed)
Patient: Zachary [name] MRN: [id number] Sex: male DOB: 06-19-1990  Provider: Ellison CarwinWilliam Hickling, MD [facility]  Note type: Routine return visit  History of Present Illness: Referral Source: Georgiann HahnAndres Ramgoolam, MD History from: mother, patient and [facility] Chief Complaint: VNS/Epilepsy  Zachary [name] is a 28 y.o. male who was evaluated on December 25, 2018, for the first time since October 01, 2018.  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  He is largely wheelchair bound.  He has moderate intellectual disability.  Since his last visit, he had 2 seizures in May and 3 seizures this month.  The most intense one occurred while he was asleep and involved tonic-clonic jerking for 2 minutes.  Most of the others were relatively brief tonic seizures.  He had a sharp tooth that came about for reasons that are unclear to me.  This was smoothed by a dentist and since that time, there have been no further seizures.  I doubt that there was a connection between those.  The patient is not able to do as many activities as he was because of the Coronavirus.  Hence, in the evening, he is restless, agitated, irritable, wants to go outside, wants to go for a ride.  His mother has tried to entertain him with things that she knows he likes as much as possible.  One of the things that he misses is swimming.  I told her that I thought the [facility] was opening back up for general swimming and that she should check that out.  His teacher from [employer] comes to take care of him while mother is at work.  In general, his health is good.  He is sleeping well.  There is no trouble giving him his medication.  The number of seizures though increased from previous times is not so  much that either of us feels we should change.  I interrogated his vagal nerve stimulator today and the results are as noted below.  Procedure: interrogation of vagal nerve stimulator  The implanted device is series 102, serial [id number], implanted May 23, 2011.  On interrogation:Normalamplitude 2.50 mA, frequency 20 Hz, pulse width 250 s, stimulation duration 7 seconds, stimulation interval 0.8 minutes Magnetamplitude 2.75 mA, magnet stimulation duration 60 seconds, magnet pulse width 250 s.  Number of stimuli since last interrogation is30; total number since implantation:846.  Systemdiagnostics with an amplitude of 1.50milliamps again showed intact communication, output, and impedance. DC/ DC conversion code was 1. There is no indication to change the battery.  Review of Systems: A complete review of systems was remarkable for mom reports that patient has had five seizures since his last visit. She states that two happened in May and three happened this month. She states that there was only one that was a gran mal seizure. She states that the patient also becomes rreally agitated in the evenings. She states that the Lorazepam that the patient is prescribed does not work. No other concerns at this time., all other systems reviewed and negative.  Past Medical History Diagnosis Date  . 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 Theressa StampsJames Crowe. Patient's AHI was 39.6 RDI is 43.6 positional component. Patient will need desensitization and a gentle approach to  CPAP titration.   . CP (cerebral palsy) (HCC)   . Fracture of femur, intertrochanteric, closed (HCC)   . Hypertension   . MR (mental retardation)   . Recurrent apnea   . Seizures (HCC)   . 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 Marland Kitchen.    Hospitalizations: No., Head Injury: No., Nervous  System Infections: No., Immunizations up to date: Yes.    Copied from prior chart I began to see him on July 10, 2004. He apparently was seen by me when he was eight months of age with developmental delay. Seizure activity; however, did not began until he is 14, two days after starting amitriptyline. 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 His most recent CT scan of the brain was on September 29, 2009, and showed no significant changes.  EEG showed diffuse background slowing.   He was treated with Depakote, Topamax, and Lyrica, but suffered side effects and agitation from those medications. He has taken Trileptal, Keppra, and Vimpat for quite some time. [facility] Oct [facility], 2010 for 6 days.for a phase 1 evaluation. MRI of the brain showed ex-vacuo dilatation of his ventricles and cortical atrophy. Volume loss most pronounced in the parietal lobes bilaterally, temporal lobes with widening of the sylvian fissures and a paucity of white matter diffusely most pronounced again in the parietal lobes with patchy T2 hyperintensity. 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 There was no evidence of mesial temporal sclerosis.  Magnetoencephalogram showed epileptiform activity arising from the left posterior temple region with a field extending to the occipital lobe. This was not seen with concurrent routine EEG.  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 This was somewhat worse in the right than the left bilateral temporal cortex and temporal pole suggesting a longstanding insult.  Prolonged video EEG showed onset of bifrontal slowing that evolved into rhythmic  activity with spikes of greater amplitude over the right central region with secondary generalization. Clinically, the patient has onset of a myoclonic jerk with his head turned to the left, movement of the left arm and leg, then tonic posturing followed by tonic-clonic generalized movements.  One day later he had bifrontal rhythmic beta range activity followed by decrement in voltage with fast beta range activity for about 30 seconds and then generalized rhythmic beta range activity. The patient had a slight head bob slowly lowered to the bed with stiffening of both upper extremities and tonic-clonic movement of both upper extremities followed by secondary generalization in his legs. A total of seven seizures were monitored. The last three were obscured by electrode artifact. Seizure onset appeared to occur from both frontal regions. This led to the recommendations to place the vagal nerve stimulator.  .  He has frequent ear infections, urinary tract infections, constipation and a hip fracture. He has had problems with neutropenia and thrombocytopenia. He had some vomiting in June, 2010 and developed aspiration pneumonia. He tends to have a temperature of 99 in the late evening. He has had several episodes in which his left knee has been dislocated. This happened as recently as August 28, 2009. He sat down in a chair and his knee went out of place. EMS was called and he was taken to the ER. While waiting for the ER physician, Amaury stretched and his knee went back in to place. His mother said that he was subsequently seen by his orthopedist, who thinks that he may eventually need surgery on his knee.  Behavior History none  Surgical History Procedure Laterality Date  . FOOT SURGERY    . FRACTURE SURGERY    . HIP SURGERY    . IMPLANTATION VAGAL NERVE STIMULATOR    . Pediatomy  2009  . TONSILLECTOMY  2009  . TYMPANOSTOMY TUBE PLACEMENT  2009   Family History family history includes Cancer in  his paternal grandmother; Diabetes in his paternal grandfather. Family history is negative for migraines, seizures, intellectual disabilities, blindness, deafness, birth defects, chromosomal disorder, or autism.  Social History Socioeconomic History  . Marital status: Single  . Years of education:  37  . Highest education level:  High school certificate  Occupational History  . Not employed due to disability  Social Needs  . Financial resource strain: Not on file  . Food insecurity    Worry: Not on file    Inability: Not on file  . Transportation needs    Medical: Not on file    Non-medical: Not on file  Tobacco Use  . Smoking status: Never Smoker  . Smokeless tobacco: Never Used  Substance and Sexual Activity  . Alcohol use: No  . Drug use: No  . 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     He enjoys swimming and going to the beach.   Allergies Allergen Reactions  . Depakote [Divalproex Sodium] Other (See Comments)    Low Platelets  . Lyrica [Pregabalin] Other (See Comments)    Sleepiness  . Penicillins Hives and Rash    Has patient had a PCN reaction causing immediate rash, facial/tongue/throat swelling, SOB or lightheadedness with hypotension: Unknown Has patient had a PCN reaction causing severe rash involving mucus membranes or skin necrosis: Yes Has patient had a PCN reaction that required hospitalization: No Has patient had a PCN reaction occurring within the last 10 years: No If all of the above answers are "NO", then may proceed with Cephalosporin use.   . Topamax [Topiramate] Other (See Comments)    Weight Loss   Physical Exam BP 120/90   Pulse 92   Wt 174 lb 12.8 oz (79.3 kg)   BMI 30.96 kg/m   General: alert, well developed, obese, in no acute distress, black hair, brown eyes, right handed with poor use of both Head: normocephalic, no dysmorphic features Ears, Nose and  Throat: Otoscopic: tympanic membranes normal; pharynx: oropharynx is pink without exudates or tonsillar hypertrophy Neck: supple, full range of motion, no cranial or cervical bruits Respiratory: auscultation clear Cardiovascular: no murmurs, pulses are normal Musculoskeletal: no skeletal deformities or apparent scoliosis; spastic tone in all 4 extremities Skin: no rashes or neurocutaneous lesions  Neurologic Exam  Mental Status: alert; makes poor eye contact, able to follow some simple commands, I did not hear him speak Cranial Nerves: visual fields are full to double simultaneous stimuli; extraocular movements are full and conjugate; pupils are round reactive to light; funduscopic examination shows bilateral positive red reflex; symmetric impassive facial strength; midline tongue; inconsistently localizes sound bilaterally Motor: spastic quadriparesis with fisting of both  hands left greater than right can lift his limbs against gravity, bear weight on his legs, clumsy fine motor movements right is better than left Sensory: withdrawal x4 Coordination: unable to test Gait and Station: Broad-based, shuffling spastic diplegic gait and station, balance is fair Reflexes: symmetric and diminished bilaterally; no clonus  Assessment 1. 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 2. 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 3. 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 4. 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 5. 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  Discussion The patient is clinically and neurologically stable.  His vagal nerve stimulator is working well.  The combination of medicines plus VNS has done a fairly good job of controlling his seizures and has certainly kept him out of the emergency department and hospitalization.  Plan I am not going to change his  current treatment.  We recently refilled his medications on his last virtual visit.  I told his mother that we needed to bring him in every 3 months because his vagal nerve stimulator has <___> (got) to be close to end of service.  It has been operative for over 7-1/2 years.  He will return to see me in 3 months' time.  I will see him sooner based on clinical need.  Greater than 50% of a 25-minute visit was spent in counseling and coordination of care concerning his seizures, quadriparesis, and autism.   Medication List   Accurate as of December 25, 2018  9:47 AM. If you have any questions, ask your nurse or doctor.    acetaminophen 500 MG tablet Commonly known as: TYLENOL Take 500 mg by mouth every 6 (six) hours as needed for mild pain, moderate pain, fever or headache.   cetirizine 10 MG tablet Commonly known as: ZYRTEC Take 1 tablet (10 mg total) by mouth daily as needed for allergies.   chlorthalidone 25 MG tablet Commonly known as: HYGROTON Take 25 mg by mouth 2 (two) times daily.   cholecalciferol 1000 units tablet Commonly known as: VITAMIN D Take 1,000 Units by mouth daily.   Diastat AcuDial 20 MG Gel Generic drug: diazepam Give 15 mg rectally after 2 minutes of seizure.   docusate sodium 100 MG capsule Commonly known as: COLACE Take 200 mg by mouth daily.   Keppra 1000 MG tablet Generic drug: levETIRAcetam TAKE ONE AND ONE-HALF TABLET TWICE DAILY   LORazepam 1 MG tablet Commonly known as: ATIVAN Take 1 tablet by mouth as needed for agitation.   Nayzilam 5 MG/0.1ML Soln Generic drug: Midazolam Place 5 mg into the nose once for 1 dose. If seizures persist may repeat in 5 minutes   Oxtellar XR 600 MG Tb24 Generic drug: OXcarbazepine ER Take 3 tablets by mouth at bedtime.   PEG 3350 17 GM/SCOOP Powd 17 GRAMS BY MOUTH DAILY AS NEEDED FOR MILD CONSTIPATION- CAN INCREASETO 4 CAPFULS DAILY IF NEEDED   potassium chloride 20 MEQ packet Commonly known as: KLOR-CON Take  20 mEq by mouth 2 (two) times daily.   promethazine 25 MG suppository Commonly known as: Phenergan Place 1 suppository (25 mg total) rectally every 6 (six) hours as needed for nausea or vomiting.   spironolactone 25 MG tablet Commonly known as: ALDACTONE Take 25 mg by mouth at bedtime.   Vimpat 100 MG Tabs Generic drug: Lacosamide Take 1 tablet in the morning and 1 tablet at 3 PM   Vimpat 200 MG Tabs tablet Generic drug: lacosamide Take 2 tablets (  400 mg total) by mouth at bedtime.    The medication list was reviewed and reconciled. All changes or newly prescribed medications were explained.  A complete medication list was provided to the patient/caregiver.  Deetta Perla MD

## 2019-02-13 ENCOUNTER — Encounter: Payer: Self-pay | Admitting: Pediatrics

## 2019-02-13 ENCOUNTER — Ambulatory Visit (INDEPENDENT_AMBULATORY_CARE_PROVIDER_SITE_OTHER): Payer: Medicaid Other | Admitting: Pediatrics

## 2019-02-13 ENCOUNTER — Other Ambulatory Visit: Payer: Self-pay

## 2019-02-13 VITALS — Temp 97.9°F | Wt 184.0 lb

## 2019-02-13 DIAGNOSIS — H6692 Otitis media, unspecified, left ear: Secondary | ICD-10-CM | POA: Diagnosis not present

## 2019-02-13 MED ORDER — SULFAMETHOXAZOLE-TRIMETHOPRIM 800-160 MG PO TABS: 1.0000 | ORAL_TABLET | Freq: Two times a day (BID) | ORAL | 0 refills | Status: AC

## 2019-02-13 NOTE — Progress Notes (Signed)
Subjective:    Ferman is a 28 y.o. old male here with his mother for Possible Ear infection (In pain since Friday)   HPI: Yader presents with history of screaming since 3 days ago and slapping right ear.  Complex history with CP and seizures, global delay.  Denies any ear drainage and has tubes.  Mom put ear drops since Tuesday.  He has also mild cough that is dry during day.  Seems more sleeping than usual.  Mom with some viral illness with runny nose and cough.  Last night rubbing head like he had a HA and given aleve.  Seemed uncomfortable.  Had a seizure this morning.  Denies any rash, diff breathing, wheezing.  Allergies to PENS.    The following portions of the patient's history were reviewed and updated as appropriate: allergies, current medications, past family history, past medical history, past social history, past surgical history and problem list.  Review of Systems Pertinent items are noted in HPI.   Allergies: Allergies  Allergen Reactions  . Depakote [Divalproex Sodium] Other (See Comments)    Low Platelets  . Lyrica [Pregabalin] Other (See Comments)    Sleepiness  . Penicillins Hives and Rash    Has patient had a PCN reaction causing immediate rash, facial/tongue/throat swelling, SOB or lightheadedness with hypotension: Unknown Has patient had a PCN reaction causing severe rash involving mucus membranes or skin necrosis: Yes Has patient had a PCN reaction that required hospitalization: No Has patient had a PCN reaction occurring within the last 10 years: No If all of the above answers are "NO", then may proceed with Cephalosporin use.   . Topamax [Topiramate] Other (See Comments)    Weight Loss     Current Outpatient Medications on File Prior to Visit  Medication Sig Dispense Refill  . acetaminophen (TYLENOL) 500 MG tablet Take 500 mg by mouth every 6 (six) hours as needed for mild pain, moderate pain, fever or headache.    . cetirizine (ZYRTEC) 10 MG tablet Take 1  tablet (10 mg total) by mouth daily as needed for allergies. 30 tablet 12  . chlorthalidone (HYGROTON) 25 MG tablet Take 25 mg by mouth 2 (two) times daily.    . cholecalciferol (VITAMIN D) 1000 units tablet Take 1,000 Units by mouth daily.    Marland Kitchen DIASTAT ACUDIAL 20 MG GEL Give 15 mg rectally after 2 minutes of seizure. 2 Package 5  . docusate sodium (COLACE) 100 MG capsule Take 200 mg by mouth daily.    Marland Kitchen KEPPRA 1000 MG tablet TAKE ONE AND ONE-HALF TABLET TWICE DAILY 93 tablet 5  . LORazepam (ATIVAN) 1 MG tablet Take 1 tablet by mouth as needed for agitation. 30 tablet 5  . OXTELLAR XR 600 MG TB24 Take 3 tablets by mouth at bedtime. 93 tablet 5  . Polyethylene Glycol 3350 (PEG 3350) POWD 17 GRAMS BY MOUTH DAILY AS NEEDED FOR MILD CONSTIPATION- CAN INCREASETO 4 CAPFULS DAILY IF NEEDED 527 g 3  . potassium chloride (KLOR-CON) 20 MEQ packet Take 20 mEq by mouth 2 (two) times daily.    Marland Kitchen spironolactone (ALDACTONE) 25 MG tablet Take 25 mg by mouth at bedtime.    Marland Kitchen VIMPAT 100 MG TABS Take 1 tablet in the morning and 1 tablet at 3 PM 62 each 5  . VIMPAT 200 MG TABS tablet Take 2 tablets (400 mg total) by mouth at bedtime. 62 each 5  . NAYZILAM 5 MG/0.1ML SOLN Place 5 mg into the nose once for  1 dose. If seizures persist may repeat in 5 minutes 2 each 5  . promethazine (PHENERGAN) 25 MG suppository Place 1 suppository (25 mg total) rectally every 6 (six) hours as needed for nausea or vomiting. 21 suppository 3   No current facility-administered medications on file prior to visit.     History and Problem List: Past Medical History:  Diagnosis Date  . 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 Theressa StampsJames Crowe. Patient's AHI was 39.6 RDI is 43.6 positional component. Patient will need desensitization and a gentle approach to CPAP titration.   . CP (cerebral palsy) (HCC)   . Fracture of femur, intertrochanteric, closed (HCC)   . Hypertension   . MR (mental  retardation)   . Recurrent apnea   . Seizures (HCC)   . 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 .         Objective:    Temp 97.9 F (36.6 C) (Temporal)   Wt 184 lb (83.5 kg) Comment: reported by mother  BMI 32.59 kg/m   General: alert, active, cooperative, non toxic, Non verbal ENT: oropharynx moist, no lesions, nares no discharge Eye:  PERRL, EOMI, conjunctivae clear, no discharge Ears: left TM with purulent fluid behind, tubes seems dislodged and not draining, right TM clear, unable to see tube with cerumen partial blocking, no discharge Neck: supple, no sig LAD Lungs: clear to auscultation, no wheeze, crackles or retractions Heart: RRR, Nl S1, S2, no murmurs Abd: soft, non tender, non distended, normal BS, no organomegaly, no masses appreciated Skin: no rashes Neuro: normal mental status, No focal deficits  No results found for this or any previous visit (from the past 72 hour(s)).     Assessment:   Jonny RuizJohn is a 28 y.o. old male with  1. Acute otitis media of left ear in pediatric patient     Plan:   --Antibiotics given below x10 days.   --Supportive care and symptomatic treatment discussed for AOM.   --Motrin/tylenol for pain or fever.     Meds ordered this encounter  Medications  . sulfamethoxazole-trimethoprim (BACTRIM DS) 800-160 MG tablet    Sig: Take 1 tablet by mouth 2 (two) times daily for 10 days.    Dispense:  20 tablet    Refill:  0     Return if symptoms worsen or fail to improve. in 2-3 days or prior for concerns  Myles GipPerry Scott Keionna Kinnaird, DO

## 2019-02-13 NOTE — Patient Instructions (Signed)
Otitis Media, Adult  Otitis media means that the middle ear is red and swollen (inflamed) and full of fluid. The condition usually goes away on its own. Follow these instructions at home:  Take over-the-counter and prescription medicines only as told by your doctor.  If you were prescribed an antibiotic medicine, take it as told by your doctor. Do not stop taking the antibiotic even if you start to feel better.  Keep all follow-up visits as told by your doctor. This is important. Contact a doctor if:  You have bleeding from your nose.  There is a lump on your neck.  You are not getting better in 5 days.  You feel worse instead of better. Get help right away if:  You have pain that is not helped with medicine.  You have swelling, redness, or pain around your ear.  You get a stiff neck.  You cannot move part of your face (paralyzed).  You notice that the bone behind your ear hurts when you touch it.  You get a very bad headache. Summary  Otitis media means that the middle ear is red, swollen, and full of fluid.  This condition usually goes away on its own. In some cases, treatment may be needed.  If you were prescribed an antibiotic medicine, take it as told by your doctor. This information is not intended to replace advice given to you by your health care provider. Make sure you discuss any questions you have with your health care provider. Document Released: 11/07/2007 Document Revised: 05/03/2017 Document Reviewed: 06/11/2016 Elsevier Patient Education  2020 Elsevier Inc.  

## 2019-02-20 ENCOUNTER — Telehealth: Payer: Self-pay | Admitting: Pediatrics

## 2019-02-20 DIAGNOSIS — R1310 Dysphagia, unspecified: Secondary | ICD-10-CM

## 2019-02-20 NOTE — Telephone Encounter (Signed)
Mom would like to have another swallowing test at [facility] and needs a order please

## 2019-02-24 ENCOUNTER — Telehealth: Payer: Self-pay | Admitting: Pediatrics

## 2019-02-24 NOTE — Telephone Encounter (Signed)
Swallow study to be done at [facility]

## 2019-02-24 NOTE — Telephone Encounter (Signed)
Would get swallow study since ENT says that nothing is wrong with his ears and his discomfort may be more related to his throat or swallowing problems.

## 2019-02-24 NOTE — Telephone Encounter (Signed)
Briar saw an ENT and he thinks his pain is a "jaw problem" .Please call mother

## 2019-02-26 NOTE — Addendum Note (Signed)
Addended by: Gari Crown on: 02/26/2019 10:21 AM   Modules accepted: Orders

## 2019-02-26 NOTE — Telephone Encounter (Signed)
Referred to [facility] ENT at Swallowing Disorder clinic for dysphagia. Will fax over barium Swallow Study to [phone]. Patient will go to [facility] 1st floor. Go to outpatient radiology. Office phone number is [phone] for physicians. If parent needs to call to reschedule [phone].

## 2019-02-26 NOTE — Addendum Note (Signed)
Addended by: Gari Crown on: 02/26/2019 10:28 AM   Modules accepted: Orders

## 2019-03-27 ENCOUNTER — Ambulatory Visit (INDEPENDENT_AMBULATORY_CARE_PROVIDER_SITE_OTHER): Payer: Medicaid Other | Admitting: Pediatrics

## 2019-03-30 ENCOUNTER — Other Ambulatory Visit: Payer: Self-pay

## 2019-03-30 ENCOUNTER — Encounter (INDEPENDENT_AMBULATORY_CARE_PROVIDER_SITE_OTHER): Payer: Self-pay | Admitting: Pediatrics

## 2019-03-30 ENCOUNTER — Ambulatory Visit (INDEPENDENT_AMBULATORY_CARE_PROVIDER_SITE_OTHER): Payer: Medicaid Other | Admitting: Pediatrics

## 2019-03-30 ENCOUNTER — Telehealth (INDEPENDENT_AMBULATORY_CARE_PROVIDER_SITE_OTHER): Payer: Self-pay | Admitting: Pediatrics

## 2019-03-30 VITALS — BP 122/74 | HR 68 | Wt 186.0 lb

## 2019-03-30 DIAGNOSIS — F840 Autistic disorder: Secondary | ICD-10-CM

## 2019-03-30 DIAGNOSIS — G808 Other cerebral palsy: Secondary | ICD-10-CM

## 2019-03-30 DIAGNOSIS — G40209 Localization-related (focal) (partial) symptomatic epilepsy and epileptic syndromes with complex partial seizures, not intractable, without status epilepticus: Secondary | ICD-10-CM

## 2019-03-30 DIAGNOSIS — R451 Restlessness and agitation: Secondary | ICD-10-CM

## 2019-03-30 DIAGNOSIS — Z9689 Presence of other specified functional implants: Secondary | ICD-10-CM

## 2019-03-30 DIAGNOSIS — R400 Somnolence: Secondary | ICD-10-CM | POA: Diagnosis not present

## 2019-03-30 DIAGNOSIS — F71 Moderate intellectual disabilities: Secondary | ICD-10-CM

## 2019-03-30 MED ORDER — LORAZEPAM 1 MG PO TABS: ORAL_TABLET | ORAL | 5 refills | Status: DC

## 2019-03-30 MED ORDER — VIMPAT 100 MG PO TABS: ORAL_TABLET | ORAL | 5 refills | Status: DC

## 2019-03-30 MED ORDER — NAYZILAM 5 MG/0.1ML NA SOLN: 5.0000 mg | Freq: Once | NASAL | 5 refills | Status: DC

## 2019-03-30 MED ORDER — KEPPRA 1000 MG PO TABS: ORAL_TABLET | ORAL | 5 refills | Status: DC

## 2019-03-30 MED ORDER — OXTELLAR XR 600 MG PO TB24: 3.0000 | ORAL_TABLET | Freq: Every day | ORAL | 5 refills | Status: DC

## 2019-03-30 MED ORDER — VIMPAT 200 MG PO TABS: 400.0000 mg | ORAL_TABLET | Freq: Every day | ORAL | 5 refills | Status: DC

## 2019-03-30 NOTE — Telephone Encounter (Signed)
°  Who's calling (name and relationship to patient) : Haynes Dage [employer] manager)  Best contact number: [phone] Provider they see: Dr. Gaynell Face Reason for call: Pharm stated that the [facility] rx is on back order every where. She wanted to know if there was an alternative rx that Dr. Gaynell Face would like to request instead.      PRESCRIPTION REFILL ONLY  Name of prescription: Nayzilam Pharmacy: [facility]

## 2019-03-30 NOTE — Telephone Encounter (Signed)
No let them know that we will wait.  The problem should soon be corrected.  Please call back.

## 2019-03-30 NOTE — Patient Instructions (Signed)
I think that Jaclyn may have injured his temporomandibular joint when he fell.  Although the excessive sleepiness seems to me as if he is going from being awake to falling into deep sleep very quickly.  Given that this only happens once a month, it is going to be impossible to capture on a sleep study and will be difficult to do a sleep study anyhow because of his inability to understand what were doing and to cooperate.  I do not think that he is having significant dysphagia I will be interested in the swallowing study.  At some point if we have to make an image of his temporomandibular joints he is going to have to be in twilight sleep or unconscious using some form of sedation that is temporary.  You can check with Dr. Belenda Cruise and see if he does that in his office.  I suspect that he does not.  The vagal nerve stimulator continues to work well almost 8 years later.  I like to see in 3 months to stay on top of this.  I also am going to encourage you to sign up for My Chart so that we can stay in touch concerning these matters that concern you.

## 2019-03-30 NOTE — Telephone Encounter (Signed)
Spoke with Haynes Dage to inform her that the issue will be corrected soon

## 2019-03-30 NOTE — Progress Notes (Signed)
Patient: Zachary [name] MRN: [id number] Sex: male DOB: 1991-04-29  Provider: Wyline Copas, MD Location of Care: [facility]  Note type: Routine return visit  History of Present Illness: Referral Source: Zachary Solders, MD History from: mother, patient and [facility] chart Chief Complaint: VNS/Epilepsy  Zachary [name] is a 28 y.o. male who returns on March 30, 2019, for the first time since December 25, 2018.  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    Recently, his seizures have been under fairly good control.  He had 3 seizures in the course of the past 3 months.  All of the episodes occurred when he had an episode of otitis media without fever.  About 2 months ago, he fell and cut his chin without loss of consciousness.  He was seen by a dentist who said that he did not injure his teeth, mandible, or TMJ.  It is not uncommon, however, each time he eats for him to smack his ears very hard.  There are times that he will stop eating and drinking.  He has not had dysphagia nor has he shown drooling.  He does not seem to be tender in his temporomandibular joints.  He has a swallow study set for November 12th.    His parents are worried that something happened as a result of his fall.  They requested some form of imaging study to evaluate his left ear and jaw.  I told them that the study that would be most useful is a Panorex.  His dentist, Zachary [name], has never been able to get an x-ray on him because the patient is unable to hold still.  About once a month, he falls deeply asleep after being awake.  He is not able to be aroused.  This has happened during the day and also at nighttime 5 or 6 times over the past 6 or 7 months.  When that happens, if he is on the floor, his mother usually will make him comfortable and cover him with a blanket.  It is not until hours later that he is able to be aroused and brought to bed.  The  reason for this is unclear.  It does not appear, however, that he has experienced a seizure prior to the episode of excessive sleepiness.  It is highly unlikely that we are dealing with a condition like narcolepsy.    I suspect that this does represent some form of sleep disorder, but it is hard to understand why he would be excessively sleepy to the point of entering deep sleep from a waking state.  He has known obstructive sleep apnea and is difficult for him to keep his CPAP in place for more than half of the night.  It will be difficult to perform a polysomnogram on him.  I remember that he had a polysomnogram at one point, which showed evidence of brief periods of sleep apnea when his vagal nerve stimulator is active.  Since he is on a rapid duty cycle, it is only active for 7 seconds every 48 seconds.  This has not produced measurable changes in pulse oximetry and I doubt that it is responsible for lightening his sleep.  The patient's vagal nerve stimulator was interrogated and is reported below.  Procedure: interrogation of vagal nerve stimulator  The implanted device is series 102, serial [id number], implanted May 23, 2011.  On interrogation:Normalamplitude 2.50 mA, frequency 20 Hz, pulse width 250 s,  stimulation duration 7 seconds, stimulation interval 0.8 minutes Magnetamplitude 2.75 mA, magnet stimulation duration 60 seconds, magnet pulse width 250 s.  Number of stimuli since lastinterrogationis52; total number since implantation:898.  Systemdiagnostics with an amplitude of 1.79milliamps again showed intact communication, output, and impedance. DC/ DC conversion code was 1. There is no indication to change the battery.  Review of Systems: A complete review of systems was remarkable for patient is here for a VNS check and for epilepsy. Mom reports that the patient experienced three seizures since last visit. She states that the seizures were due to ear infections or  possibly something else. She also reports that when the patient eats he has a habit of smacking his left ear. He has been to the ENT and nothing was wrong. The ENT states that maybe is jaw is out of line and needs to have an MRI or CT. She also reports that he had a few episodes of narcolepsy. She states that he can be wide awake and then fall asleep out of nowhere and can not be awakened. No other concerns at this time., all other systems reviewed and negative.  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 Patient's AHI was 39.6 RDI is 43.6 positional component. Patient will need desensitization and a gentle approach to CPAP titration.    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 Marland Kitchen    Hospitalizations: No., Head Injury: No., Nervous System Infections: No., Immunizations up to date: Yes.    Copied from prior chart I began to see him on July 10, 2004. He apparently was seen by me when he was eight months of age with developmental delay. Seizure activity; however, did not began until he is 14, two days after starting amitriptyline. 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 His most recent CT scan of the brain was on September 29, 2009, and showed no significant changes.  EEG showed diffuse background slowing.   He was treated with Depakote, Topamax, and Lyrica, but suffered side effects and agitation from those medications. He has taken Trileptal, Keppra, and Vimpat for quite some  time.   He was seen at [facility] on Oct 27, 2010 for 6 days.for a phase 1 evaluation. MRI of the brain showed ex-vacuo dilatation of his ventricles and cortical atrophy. Volume loss most pronounced in the parietal lobes bilaterally, temporal lobes with widening of the sylvian fissures and a paucity of white matter diffusely most pronounced again in the parietal lobes with patchy T2 hyperintensity. 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 There was no evidence of mesial temporal sclerosis.  Magnetoencephalogram showed epileptiform activity arising from the left posterior temple region with a field extending to the occipital lobe. This was not seen with concurrent routine EEG.  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 This was somewhat worse in  the right than the left bilateral temporal cortex and temporal pole suggesting a longstanding insult.  Prolonged video EEG showed onset of bifrontal slowing that evolved into rhythmic activity with spikes of greater amplitude over the right central region with secondary generalization. Clinically, the patient has onset of a myoclonic jerk with his head turned to the left, movement of the left arm and leg, then tonic posturing followed by tonic-clonic generalized movements.  One day later he had bifrontal rhythmic beta range activity followed by decrement in voltage with fast beta range activity for about 30 seconds and then generalized rhythmic beta range activity. The patient had a slight head bob slowly lowered to the bed with stiffening of both upper extremities and tonic-clonic movement of both upper extremities followed by secondary generalization in his legs. A total of seven seizures were monitored. The last three were obscured by electrode artifact. Seizure onset appeared to occur  from both frontal regions. This led to the recommendations to place the vagal nerve stimulator.  .  He has frequent ear infections, urinary tract infections, constipation and a hip fracture. He has had problems with neutropenia and thrombocytopenia. He had some vomiting in June, 2010 and developed aspiration pneumonia. He tends to have a temperature of 99 in the late evening.   He had several episodes in which his left knee has been dislocated. This happened as recently as August 28, 2009. He sat down in a chair and his knee went out of place. EMS was called and he was taken to the ER. While waiting for the ER physician, Gevon stretched and his knee went back in to place. His mother said that he was subsequently seen by his orthopedist, who thinks that he may eventually need surgery on his knee.  Home sleep study December 20, 2013 through [facility] showed evidence of obstructive apnea with desaturations.  This is discussed in detail in the February 11, 2014 note in his chart.  He was prescribed CPAP and is able to wear it to some extent but does not have a high quality sleep.  In addition the study showed that when the vagal nerve stimulator discharges, there are brief periods of apnea corresponding to the discharge.  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 Family history is negative for migraines, seizures, intellectual disabilities, blindness, deafness, birth defects, chromosomal disorder, or autism.  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     He enjoys swimming and going to the beach.   Allergies Allergen Reactions   Depakote [Divalproex Sodium] Other (See Comments)    Low Platelets   Lyrica [Pregabalin] Other (See Comments)    Sleepiness   Penicillins Hives and Rash    Has patient had a PCN reaction causing immediate rash, facial/tongue/throat swelling, SOB or lightheadedness with hypotension: Unknown Has patient had a PCN reaction causing severe rash involving mucus membranes or skin necrosis: Yes Has patient had a PCN reaction that required hospitalization: No Has patient had a PCN reaction occurring within the last 10 years: No If all of the above answers are "NO", then may proceed with Cephalosporin use.    Topamax [Topiramate] Other (See Comments)    Weight Loss   Physical Exam BP 122/74    Pulse 68    Wt 186 lb (84.4 kg)    BMI 32.95 kg/m   General: alert, well developed, well nourished, in no acute distress, black hair, brown eyes, right handed Head: normocephalic, no dysmorphic features Ears, Nose and Throat: Otoscopic: tympanic membranes normal, partial occlusion with wax; pharynx: oropharynx is pink without exudates or tonsillar hypertrophy Neck: supple, full range of motion, no cranial or cervical bruits Respiratory: auscultation clear Cardiovascular: no murmurs, pulses are normal Musculoskeletal: 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, fisting of the left hand more so than the right, equinus deformities of both legs with tight heel cords, spastic tone in all 4 extremities, least on the right Skin: no rashes or neurocutaneous lesions  Neurologic Exam  Mental Status: alert; makes poor eye contact, able to follow simple commands; knowledge is below normal for age; language is below normal Cranial Nerves: visual fields are full to double simultaneous stimuli; extraocular movements are full and conjugate; pupils are round reactive to light; funduscopic examination shows bilateral positive red reflex; symmetric impassive facial strength; midline tongue and uvula; inconsistently localizes sound, but I believe that his hearing is normal because of his ability to follow simple commands bilaterally Motor: 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 2. 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 3. Autism spectrum disorder with accompanying intellectual impairment requiring very substantial support, level 3, F84.0. 4. Daytime somnolence, R40.0. 5. 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 6. Status post vagal nerve stimulator, Z96.89. 7. Agitation, R45.1.  Discussion The patient in my opinion is neurologically and physically stable.  I do not see any abnormality in his jaw or his  temporomandibular joints.  He has no trouble bringing his teeth together and no tenderness in the temporomandibular joints.  Plan No change in his antiepileptic medication.  Netta Corriganayzilam is on back order.  We will wait until it is available and give it to him.  His parents have not needed to use a rescue benzodiazepine in a long time.  His vagal nerve stimulator is working well, which is remarkable after being implanted nearly  8 years ago.  Also, he is drawing a lot of current from his battery with the amperage and high duty cycle.  Given his relatively good seizure control there is no reason to make any changes in his treatment at this time.  I told his parents if they want to get some form of abortive treatment that I thought Nayzilam would be most appropriate.  They are having some supply issues, which I expect to be solved within the near future.  Therefore, no change will be made.  He will return to see me in 3 months' time.  I will see him sooner based on clinical need.  Prescriptions were issued today for Keppra, Oxtellar, both Vimpat treatments and Nayzilam as well as lorazepam.  Greater than 50% of a 25-minute visit was spent in counseling and coordination of care.  In addition, the vagal nerve stimulator was interrogated and reprogrammed.   Medication List   Accurate as of March 30, 2019  8:25 AM. If you have any questions, ask your nurse or doctor.    acetaminophen 500 MG tablet Commonly known as: TYLENOL Take 500 mg by mouth every 6 (six) hours as needed for mild pain, moderate pain, fever or headache.   cetirizine 10 MG tablet Commonly known as: ZYRTEC Take 1 tablet (10 mg total) by mouth daily as needed for allergies.   chlorthalidone 25 MG tablet Commonly known as: HYGROTON Take 25 mg by mouth 2 (two) times daily.   cholecalciferol 1000 units tablet Commonly known as: VITAMIN D Take 1,000 Units by mouth daily.   Diastat AcuDial 20 MG Gel Generic drug: diazepam Give 15 mg  rectally after 2 minutes of seizure.   docusate sodium 100 MG capsule Commonly known as: COLACE Take 200 mg by mouth daily.   Keppra 1000 MG tablet Generic drug: levETIRAcetam TAKE ONE AND ONE-HALF TABLET TWICE DAILY   LORazepam 1 MG tablet Commonly known as: ATIVAN Take 1 tablet by mouth as needed for agitation.   Nayzilam 5 MG/0.1ML Soln Generic drug: Midazolam Place 5 mg into the nose once for 1 dose. If seizures persist may repeat in 5 minutes   Oxtellar XR 600 MG Tb24 Generic drug: OXcarbazepine ER Take 3 tablets by mouth at bedtime.   PEG 3350 17 GM/SCOOP Powd 17 GRAMS BY MOUTH DAILY AS NEEDED FOR MILD CONSTIPATION- CAN INCREASETO 4 CAPFULS DAILY IF NEEDED   potassium chloride 20 MEQ packet Commonly known as: KLOR-CON Take 20 mEq by mouth 2 (two) times daily.   promethazine 25 MG suppository Commonly known as: Phenergan Place 1 suppository (25 mg total) rectally every 6 (six) hours as needed for nausea or vomiting.   spironolactone 25 MG tablet Commonly known as: ALDACTONE Take 25 mg by mouth at bedtime.   Vimpat 100 MG Tabs Generic drug: Lacosamide Take 1 tablet in the morning and 1 tablet at 3 PM   Vimpat 200 MG Tabs tablet Generic drug: lacosamide Take 2 tablets (400 mg total) by mouth at bedtime.    The medication list was reviewed and reconciled. All changes or newly prescribed medications were explained.  A complete medication list was provided to the patient/caregiver.  Deetta Perla MD

## 2019-04-01 ENCOUNTER — Telehealth (INDEPENDENT_AMBULATORY_CARE_PROVIDER_SITE_OTHER): Payer: Self-pay

## 2019-04-01 NOTE — Telephone Encounter (Signed)
Called pharmacy to update them of the approval of PA for nayzilam. [place] # [id number] 04/01/2019-03/26/2020

## 2019-04-03 ENCOUNTER — Other Ambulatory Visit (INDEPENDENT_AMBULATORY_CARE_PROVIDER_SITE_OTHER): Payer: Self-pay | Admitting: Pediatrics

## 2019-04-03 DIAGNOSIS — G40209 Localization-related (focal) (partial) symptomatic epilepsy and epileptic syndromes with complex partial seizures, not intractable, without status epilepticus: Secondary | ICD-10-CM

## 2019-04-28 ENCOUNTER — Encounter: Payer: Self-pay | Admitting: Pediatrics

## 2019-04-28 ENCOUNTER — Ambulatory Visit (HOSPITAL_COMMUNITY)
Admission: EM | Admit: 2019-04-28 | Discharge: 2019-04-28 | Disposition: A | Discharge status: Home / Self Care | Payer: Medicaid Other | Attending: Family Medicine | Admitting: Family Medicine

## 2019-04-28 ENCOUNTER — Ambulatory Visit (INDEPENDENT_AMBULATORY_CARE_PROVIDER_SITE_OTHER): Payer: Medicaid Other | Admitting: Pediatrics

## 2019-04-28 ENCOUNTER — Encounter (HOSPITAL_COMMUNITY): Payer: Self-pay | Admitting: Family Medicine

## 2019-04-28 ENCOUNTER — Other Ambulatory Visit: Payer: Self-pay

## 2019-04-28 DIAGNOSIS — L538 Other specified erythematous conditions: Secondary | ICD-10-CM | POA: Diagnosis not present

## 2019-04-28 DIAGNOSIS — R339 Retention of urine, unspecified: Secondary | ICD-10-CM | POA: Diagnosis present

## 2019-04-28 DIAGNOSIS — R338 Other retention of urine: Secondary | ICD-10-CM | POA: Diagnosis not present

## 2019-04-28 DIAGNOSIS — J392 Other diseases of pharynx: Secondary | ICD-10-CM

## 2019-04-28 LAB — POCT RAPID STREP A: Streptococcus, Group A Screen (Direct): NEGATIVE

## 2019-04-28 LAB — POCT URINALYSIS DIP (DEVICE)
Bilirubin Urine: NEGATIVE
Glucose, UA: NEGATIVE mg/dL
Hgb urine dipstick: NEGATIVE
Ketones, ur: NEGATIVE mg/dL
Leukocytes,Ua: NEGATIVE
Nitrite: NEGATIVE
Protein, ur: NEGATIVE mg/dL
Specific Gravity, Urine: 1.02 (ref 1.005–1.030)
Urobilinogen, UA: 1 mg/dL (ref 0.0–1.0)
pH: 7.5 (ref 5.0–8.0)

## 2019-04-28 MED ORDER — TAMSULOSIN HCL 0.4 MG PO CAPS: 0.4000 mg | ORAL_CAPSULE | Freq: Every day | ORAL | 0 refills | Status: DC

## 2019-04-28 NOTE — ED Provider Notes (Signed)
[place]    CSN: [id number] Arrival date & time: 04/28/19  1057      History   Chief Complaint Chief Complaint  Patient presents with  . Otalgia    Bilateral  . Unable to Urinate    HPI Zachary [name] is a 28 y.o. male.   Initial [facility] visit for this special needs man whose caretaker suspects UTI because of change in behavior.  She was referred here after speaking with the usual health care provider office.  He is slapping himself up side the head periodically during the day, suggesting discomfort.  He is brought here by his two care takers.  Patient has a h/o ear infections and sees Dr. Thornell Mule.  He has had myringotomy tubes placed in the past.  Also, patient seems more agitated when eating.  Also, patient is voiding only once a day, suggesting he is holding his urine.  (Normally, he is changed several times a day)     Past Medical History:  Diagnosis Date  . 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 Hyman Bower. Patient's AHI was 39.6 RDI is 43.6 positional component. Patient will need desensitization and a gentle approach to CPAP titration.   . CP (cerebral palsy) ([place])   . Fracture of femur, intertrochanteric, closed ([place])   . Hypertension   . MR (mental retardation)   . Recurrent apnea   . Seizures ([place])   . 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 .     Patient Active Problem List   Diagnosis Date Noted  . Daytime somnolence 03/30/2019  . Right acute serous otitis media 07/15/2018  . Cerebral palsy, unspecified ([place]) 10/13/2015  . Physical exam, annual 01/31/2015  . Complex partial seizures evolving to generalized tonic-clonic seizures ([place]) 04/21/2014  . Status post placement of VNS (vagus nerve stimulation) device 02/11/2014  . 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 Autism spectrum disorder with  accompanying intellectual impairment, requiring very substantial support (level 3) 12/30/2013  . Congenital quadriplegia ([place]) 12/30/2013  . 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 Well adult health check 04/28/2013  . 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 Hypertension 06/25/2011  . Constipation, chronic 12/27/2010  . Tonic-clonic seizures, intractable ([place]) 11/05/2010  . MRSA (methicillin resistant Staphylococcus aureus) carrier 11/05/2010    Past Surgical History:  Procedure Laterality Date  . FOOT SURGERY    . FRACTURE SURGERY    . HIP SURGERY    . IMPLANTATION VAGAL NERVE STIMULATOR    . Pediatomy  2009  . TONSILLECTOMY  2009  . TYMPANOSTOMY TUBE PLACEMENT  2009       Home Medications    Prior to Admission medications   Medication Sig Start Date End Date Taking? Authorizing Provider  acetaminophen (TYLENOL) 500 MG tablet Take 500 mg by mouth every 6 (six) hours as needed for mild pain, moderate pain, fever or headache.    [provider]  cetirizine (ZYRTEC) 10 MG tablet Take 1 tablet (10 mg total) by mouth daily as needed for allergies. 03/12/16   Marcha Solders, MD  chlorthalidone (HYGROTON) 25 MG tablet Take 25 mg by mouth 2 (two) times daily.    [provider]  cholecalciferol (VITAMIN D) 1000 units tablet Take 1,000 Units by mouth daily.    [provider]  DIASTAT ACUDIAL 20 MG GEL Give 15 mg rectally after 2 minutes of seizure. 03/20/18   Deetta Perla, MD  docusate sodium (COLACE) 100 MG capsule Take 200 mg by mouth daily.    [provider]  KEPPRA 1000 MG tablet TAKE ONE AND ONE-HALF TABLET TWICE DAILY 03/30/19   Deetta Perla, MD  LORazepam (ATIVAN) 1 MG tablet Take 1 tablet by mouth as needed for agitation. 03/30/19   Deetta Perla, MD  NAYZILAM 5 MG/0.1ML SOLN Place 5 mg into the nose once for  1 dose. If seizures persist may repeat in 5 minutes 03/30/19 03/30/19  Deetta Perla, MD  OXTELLAR XR 600 MG TB24 Take 3 tablets by mouth at bedtime. 03/30/19   Deetta Perla, MD  Polyethylene Glycol 3350 (PEG 3350) POWD 17 GRAMS BY MOUTH DAILY AS NEEDED FOR MILD CONSTIPATION- CAN INCREASETO 4 CAPFULS DAILY IF NEEDED 03/29/18   [name], MD  potassium chloride (KLOR-CON) 20 MEQ packet Take 20 mEq by mouth 2 (two) times daily.    [provider]  promethazine (PHENERGAN) 25 MG suppository Place 1 suppository (25 mg total) rectally every 6 (six) hours as needed for nausea or vomiting. 10/22/16 07/31/17  [name], MD  spironolactone (ALDACTONE) 25 MG tablet Take 25 mg by mouth at bedtime.    [provider]  tamsulosin (FLOMAX) 0.4 MG CAPS capsule Take 1 capsule (0.4 mg total) by mouth daily after supper. 04/28/19   Elvina Sidle, MD  VIMPAT 100 MG TABS Take 1 tablet in the morning and 1 tablet at 3 PM 03/30/19   Hickling, Deanna Artis, MD  VIMPAT 200 MG TABS tablet Take 2 tablets (400 mg total) by mouth at bedtime. 03/30/19   Deetta Perla, MD    Family History Family History  Problem Relation Age of Onset  . Cancer Paternal Grandmother        Died in her 24's  . Diabetes Paternal Grandfather        Died in his 57's     Social History Social History   Tobacco Use  . Smoking status: Never Smoker  . Smokeless tobacco: Never Used  Substance Use Topics  . Alcohol use: No  . Drug use: No     Allergies   Depakote [divalproex sodium], Lyrica [pregabalin], Penicillins, and Topamax [topiramate]   Review of Systems Review of Systems  Constitutional: Negative for fever.  Psychiatric/Behavioral: Positive for agitation.     Physical Exam Triage Vital Signs ED Triage Vitals  Enc Vitals Group     BP      Pulse      Resp      Temp      Temp src      SpO2      Weight      Height      Head Circumference      Peak Flow      Pain  Score      Pain Loc      Pain Edu?      Excl. in GC?    No data found.  Updated Vital Signs BP 118/75 (BP Location: Right Arm)   Pulse 78   Temp 98.7 F (37.1 C) (Oral)   Resp 20   SpO2 100%    Physical Exam Vitals signs and nursing note reviewed.  Constitutional:      General: He is not in acute distress.    Appearance: He is obese. He  is not ill-appearing or toxic-appearing.  HENT:     Head: Normocephalic.     Right Ear: External ear normal.     Left Ear: External ear normal.     Ears:     Comments: Chronically scarred TM's bilaterally with some wax in canals, no erythema    Mouth/Throat:     Mouth: Mucous membranes are moist.     Pharynx: Posterior oropharyngeal erythema present.  Eyes:     Conjunctiva/sclera: Conjunctivae normal.  Neck:     Musculoskeletal: Normal range of motion and neck supple.  Musculoskeletal: Normal range of motion.  Skin:    General: Skin is warm and dry.  Neurological:     Mental Status: He is alert.  Psychiatric:     Comments: Nonverbal.  Follows no commands.      UC Treatments / Results  Labs (all labs ordered are listed, but only abnormal results are displayed) Labs Reviewed  CULTURE, GROUP A STREP [facility]  POCT RAPID STREP A  POCT URINALYSIS DIP (DEVICE)    EKG   Radiology No results found.  Procedures Procedures (including critical care time)  Medications Ordered in UC Medications - No data to display  Initial Impression / Assessment and Plan / UC Course  I have reviewed the triage vital signs and the nursing notes.  Pertinent labs & imaging results that were available during my care of the patient were reviewed by me and considered in my medical decision making (see chart for details).    Final Clinical Impressions(s) / UC Diagnoses   Final diagnoses:  Urinary retention     Discharge Instructions     The urine does not show infection.  There was a large quantity of urine in the bladder (400 cc) and so I  am prescribing a medicine to help urine flow.    ED Prescriptions    Medication Sig Dispense Auth. Provider   tamsulosin (FLOMAX) 0.4 MG CAPS capsule Take 1 capsule (0.4 mg total) by mouth daily after supper. 30 capsule Elvina SidleLauenstein, Latiffany Harwick, MD     I have reviewed the PDMP during this encounter.   Elvina SidleLauenstein, Abdikadir Fohl, MD 04/28/19 1254

## 2019-04-28 NOTE — Discharge Instructions (Addendum)
The urine does not show infection.  There was a large quantity of urine in the bladder (400 cc) and so I am prescribing a medicine to help urine flow.

## 2019-04-28 NOTE — Progress Notes (Signed)
Virtual Visit via Telephone Note  I connected with Zachary [name] on 04/28/19 at 11:45 AM EST by telephone and verified that I am speaking with the correct person using two identifiers.   I discussed the limitations, risks, security and privacy concerns of performing an evaluation and management service by telephone and the availability of in person appointments. I also discussed with the patient that there may be a patient responsible charge related to this service. The patient expressed understanding and agreed to proceed.   History of Present Illness: Known case of developmental delay who has acute urinary retention for the past day. Discussed with urologist and in and out cath recommended.    Observations/Objective: Developmental delay with acute urinary retention  Assessment and Plan: Advised [name] to tae him to ER/Urgent care since we do not have ability at this office to cath him. [name] expressed understanding.  Follow Up Instructions:    I discussed the assessment and treatment plan with the patient. The patient was provided an opportunity to ask questions and all were answered. The patient agreed with the plan and demonstrated an understanding of the instructions.   The patient was advised to call back or seek an in-person evaluation if the symptoms worsen or if the condition fails to improve as anticipated.  I provided 15 minutes of non-face-to-face time during this encounter.   Marcha Solders, MD

## 2019-04-28 NOTE — ED Triage Notes (Signed)
Per caregiver pt presents with bilateral ear pain and being unable to urinate.

## 2019-04-30 LAB — CULTURE, GROUP A STREP (THRC)

## 2019-05-05 ENCOUNTER — Ambulatory Visit (INDEPENDENT_AMBULATORY_CARE_PROVIDER_SITE_OTHER): Payer: Medicaid Other | Admitting: Pediatrics

## 2019-05-05 ENCOUNTER — Other Ambulatory Visit: Payer: Self-pay

## 2019-05-05 ENCOUNTER — Encounter: Payer: Self-pay | Admitting: Pediatrics

## 2019-05-05 VITALS — BP 120/82 | Ht 63.0 in | Wt 187.0 lb

## 2019-05-05 DIAGNOSIS — Z0001 Encounter for general adult medical examination with abnormal findings: Secondary | ICD-10-CM

## 2019-05-05 DIAGNOSIS — Z00121 Encounter for routine child health examination with abnormal findings: Secondary | ICD-10-CM | POA: Insufficient documentation

## 2019-05-05 DIAGNOSIS — Z23 Encounter for immunization: Secondary | ICD-10-CM

## 2019-05-05 DIAGNOSIS — R338 Other retention of urine: Secondary | ICD-10-CM

## 2019-05-05 DIAGNOSIS — F840 Autistic disorder: Secondary | ICD-10-CM

## 2019-05-05 DIAGNOSIS — Z0000 Encounter for general adult medical examination without abnormal findings: Secondary | ICD-10-CM

## 2019-05-05 DIAGNOSIS — G40319 Generalized idiopathic epilepsy and epileptic syndromes, intractable, without status epilepticus: Secondary | ICD-10-CM

## 2019-05-05 MED ORDER — ALBUTEROL SULFATE (2.5 MG/3ML) 0.083% IN NEBU: 2.5000 mg | INHALATION_SOLUTION | Freq: Four times a day (QID) | RESPIRATORY_TRACT | 12 refills | Status: DC | PRN

## 2019-05-05 MED ORDER — FLUOCINOLONE ACETONIDE BODY 0.01 % EX OIL: 1.0000 "application " | TOPICAL_OIL | Freq: Every day | CUTANEOUS | 12 refills | Status: AC

## 2019-05-05 MED ORDER — MASKS MISC: 6 refills | Status: AC

## 2019-05-05 MED ORDER — LACTULOSE 20 GM/30ML PO SOLN: 10.0000 g | Freq: Every day | ORAL | 6 refills | Status: AC

## 2019-05-05 MED ORDER — SULFAMETHOXAZOLE-TRIMETHOPRIM 800-160 MG PO TABS: 1.0000 | ORAL_TABLET | Freq: Two times a day (BID) | ORAL | 6 refills | Status: AC

## 2019-05-05 NOTE — Progress Notes (Signed)
History of Present Illness Main concerns today are: Urinary retention--need for catheters Risk of COVID-19--need for masks Gaining weight and mom has trouble moving the wheelchair---needs a ELECTRIC wheelchair   Developmental History Developmental delay and autism  Function: Mobility: manual wheelchair Pain concerns: no Hand function:  Right: reduced dexterity  Left: reduced dexterity Spine curvature: mild Swallowing: normal and modified diet  Toileting: dependent   Equipment:  Scientist, forensic Pulse ox Oxygen tubes and tank Diapers and pads Wipes and gloves Sleep apnea machine  DIET--Ensure 1 per day   Specialists- Neuro-DR [employer]  ENT --[place] Ortho-Dr Neldon Mc Pulmonary-N/A Cardio--N/A Endocrinology--N/A Dental--Needs one Ophthal--Dr Annamaria Boots Urology--[place] Surgeon--N/A  Review of Systems: Vision: impaired Hearing: impaired Seizures: yes - controlled Constipation: no Urinary retention--followed by urology  The following portions of the patient's history were reviewed and updated as appropriate: allergies, current medications, past family history, past medical history, past social history, past surgical history and problem list.    Objective:    Physical Exam  Cognition: non-interactive Respiratory: normal, no increased effort Lower extremity function: decreased Abdomen: normal--no G tube present Spine scoliosis: mild  Sitting Ability: assisted Gait: wheelchair    Assessment:   Urinary retention--need for catheters Risk of COVID-19--need for masks Gaining weight and mom has trouble moving the wheelchair---needs a ELECTRIC wheelchair   Plan:   Will order refills for meds  Order new equipment as needed

## 2019-05-05 NOTE — Patient Instructions (Signed)
Epilepsy Epilepsy is when a person keeps having seizures. A seizure is a burst of abnormal activity in the brain. A seizure can change how you think or behave, and it can make it hard to be aware of what is happening. This condition can cause problems such as:  Falls, accidents, and injury.  Sadness (depression).  Poor memory.  Sudden unexplained death in epilepsy (SUDEP). This is rare. Its cause is not known. Most people with epilepsy lead normal lives. What are the causes? This condition may be caused by:  A head injury.  An injury that happens at birth.  A high fever during childhood.  A stroke.  Bleeding that goes into or around the brain.  Certain medicines and drugs.  Having too little oxygen for a long period of time.  Abnormal brain development.  Certain infections.  Brain tumors.  Conditions that are passed from parent to child (are hereditary). What are the signs or symptoms? Symptoms of a seizure vary from person to person. They may include:  Jerky movements of muscles (convulsions).  Stiffening of the body.  Movements of the arms or legs that you are not able to control.  Passing out (loss of consciousness).  Breathing problems.  Sudden falls.  Confusion.  Head nodding.  Eye blinking or twitching.  Lip smacking.  Drooling.  Fast eye movements.  Grunting.  Not being able to control when you pee or poop.  Staring.  Being hard to wake up (unresponsiveness). Some people have symptoms right before a seizure happens (aura) and right after a seizure happens. These symptoms include:  Fear or anxiety.  Feeling sick to your stomach (nauseous).  Feeling like the room is spinning (vertigo).  A feeling of having seen or heard something before (dj vu).  Odd tastes or smells.  Changes in how you see (vision), such as seeing flashing lights or spots. Symptoms that follow a seizure include:  Being confused.  Being sleepy.  Having a  headache. How is this treated? Treatment can control seizures. Treatment for this condition may involve:  Taking medicines to control seizures.  Having a device (vagus nerve stimulator) put in the chest. The device sends signals to a nerve and to the brain to prevent seizures.  Brain surgery to stop seizures from happening or to reduce how often they happen.  Having blood tests often to make sure you are getting the right amount of medicine. Once this condition has been diagnosed, it is important to start treatment as soon as possible. For some people, epilepsy goes away in time. Others will need treatment for the rest of their life. Follow these instructions at home: Medicines  Take over-the-counter and prescription medicines only as told by your doctor.  Avoid anything that may keep your medicine from working, such as alcohol. Activity  Get enough rest. Lack of sleep can make seizures more likely to occur.  Follow your doctor's advice about driving, swimming, and doing anything else that would be dangerous if you had a seizure. ? If you live in the [place] check with your local DMV (department of motor vehicles) to find out about local driving laws. Each state has rules about when you can return to driving. Teaching others Teach friends and family what to do if you have a seizure. They should:  Lay you on the ground to prevent a fall.  Cushion your head and body.  Loosen any tight clothing around your neck.  Turn you on your side.  Stay with   you until you are better.  Not hold you down.  Not put anything in your mouth.  Know whether or not you need emergency care.  General instructions  Avoid anything that causes you to have seizures.  Keep a seizure diary. Write down what you remember about each seizure. Be sure to include what might have caused it.  Keep all follow-up visits as told by your doctor. This is important. Contact a doctor if:  You have a change in how  often or when you have seizures.  You get an infection or start to feel sick. You may have more seizures when you are sick. Get help right away if:  A seizure does not stop after 5 minutes.  You have more than one seizure in a row, and you do not have enough time between the seizures to feel better.  A seizure makes it harder to breathe.  A seizure is different from other seizures you have had.  A seizure makes you unable to speak or use a part of your body.  You did not wake up right after a seizure. These symptoms may be an emergency. Do not wait to see if the symptoms will go away. Get medical help right away. Call your local emergency services (911 in the [place] Do not drive yourself to the hospital. Summary  Epilepsy is when a person keeps having seizures. A seizure is a burst of abnormal activity in the brain.  Treatment can control seizures.  Teach friends and family what to do if you have a seizure. This information is not intended to replace advice given to you by your health care provider. Make sure you discuss any questions you have with your health care provider. Document Released: 03/18/2009 Document Revised: 01/13/2018 Document Reviewed: 01/13/2018 Elsevier Patient Education  2020 [employer]  

## 2019-05-06 ENCOUNTER — Other Ambulatory Visit: Payer: Self-pay | Admitting: Pediatrics

## 2019-05-06 DIAGNOSIS — F840 Autistic disorder: Secondary | ICD-10-CM

## 2019-05-11 ENCOUNTER — Telehealth: Payer: Self-pay | Admitting: Pediatrics

## 2019-05-11 NOTE — Telephone Encounter (Signed)
The lady from [facility] called and for Zachary [name] they need to know what cather he is using and what size and his last office notes faxed to [phone] please

## 2019-05-13 ENCOUNTER — Other Ambulatory Visit: Payer: Self-pay | Admitting: Pediatrics

## 2019-05-13 DIAGNOSIS — R338 Other retention of urine: Secondary | ICD-10-CM

## 2019-05-13 MED ORDER — FOLEY CATHETER 2-WAY PEDIATRIC MISC: 6 refills | Status: AC

## 2019-05-13 NOTE — Telephone Encounter (Signed)
Will do a size 14 Foley catherter

## 2019-05-26 ENCOUNTER — Encounter (INDEPENDENT_AMBULATORY_CARE_PROVIDER_SITE_OTHER): Payer: Self-pay | Admitting: Pediatrics

## 2019-05-26 ENCOUNTER — Other Ambulatory Visit: Payer: Self-pay

## 2019-05-26 ENCOUNTER — Telehealth (INDEPENDENT_AMBULATORY_CARE_PROVIDER_SITE_OTHER): Payer: Self-pay | Admitting: Pediatrics

## 2019-05-26 ENCOUNTER — Ambulatory Visit (INDEPENDENT_AMBULATORY_CARE_PROVIDER_SITE_OTHER): Payer: Medicaid Other | Admitting: Pediatrics

## 2019-05-26 VITALS — HR 80 | Wt 186.0 lb

## 2019-05-26 DIAGNOSIS — G40209 Localization-related (focal) (partial) symptomatic epilepsy and epileptic syndromes with complex partial seizures, not intractable, without status epilepticus: Secondary | ICD-10-CM

## 2019-05-26 DIAGNOSIS — G40319 Generalized idiopathic epilepsy and epileptic syndromes, intractable, without status epilepticus: Secondary | ICD-10-CM

## 2019-05-26 NOTE — Telephone Encounter (Signed)
  Who's calling (name and relationship to patient) : Marcene Corning - Mom   Best contact number: [phone]  Provider they see: Dr Gaynell Face   Reason for call: Mom called to to advise Dr Gaynell Face that Yazir has had 2 grand mal seizures yesterday and is very concerned about his battery being dead. Please call as soon as possible to discuss next steps.  Also, sent mom a my chart form to get them set back up on My chart.     PRESCRIPTION REFILL ONLY  Name of prescription:  Pharmacy:

## 2019-05-26 NOTE — Telephone Encounter (Signed)
I called mom and she is bringing Zachary [name] over so I can check the VNS.  I do not think that the battery is dead.  Nonetheless has been 2 months since I saw him.  He is not having any signs of illness.

## 2019-05-26 NOTE — Telephone Encounter (Signed)
  Pts mother called and stated that Zachary [name] was having increased seizures due to his battery or DNS. Please call [phone]

## 2019-05-26 NOTE — Patient Instructions (Addendum)
Keep your regularly scheduled appointment in June 30, 2019.

## 2019-05-26 NOTE — Progress Notes (Signed)
Patient: Zachary [name] MRN: [id number] Sex: male DOB: Sep 07, 1990  Provider: Ellison Carwin, MD Location of Care: [facility]  Note type: Routine return visit  History of Present Illness: Referral Source: [name], MD History from: mother and aide, patient and [facility] chart Chief Complaint: VNS/Epilepsy  Zachary [name] is a 28 y.o. male who returns May 26, 2019 for the first time since March 30, 2019.  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  His mother called today to inform me that he had seizures on December 12, 17th and 2 on December 21.  The latter 2 were both generalized tonic-clonic seizures.  She was concerned about the vagal nerve stimulator and whether or not it was failing.  I encouraged her to bring him to the office so that we could interrogate it.  In general his health is good.  He is not running a fever.  He receives his medicine compliantly.  There is been no problem with his sleep.  His mother says that he blanks "like he has a headache".  It used to be that when she swiped the magnet, that it stopped the seizures abruptly.  That no longer happens.  Procedure: interrogation of vagal nerve stimulator  The implanted device is series 102, serial [id number], implanted May 23, 2011.  On interrogation:Normalamplitude 2.50 mA, frequency 20 Hz, pulse width 250 s, stimulation duration 7 seconds, stimulation interval 0.8 minutes Magnetamplitude 2.75 mA, magnet stimulation duration 60 seconds, magnet pulse width 250 s.  Number of stimuli since lastinterrogationis12; total number since implantation:910.  Systemdiagnostics with an amplitude of 1.13milliamps again showed intact communication, output, and impedance. DC/ DC conversion code was 1. There is no indication to change the battery.  Review of Systems: A complete review of systems was remarkable for patient is here for a VNS check and  for epilepsy. Patient has had four seizures this month. He had two seizures yesterday. We are assessing his VNS to see what the issue is. No other concerns at this time,, all other systems reviewed and negative.  Past Medical History Diagnosis Date  . 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 Theressa Stamps. Patient's AHI was 39.6 RDI is 43.6 positional component. Patient will need desensitization and a gentle approach to CPAP titration.   . CP (cerebral palsy) (HCC)   . Fracture of femur, intertrochanteric, closed (HCC)   . Hypertension   . MR (mental retardation)   . Recurrent apnea   . Seizures (HCC)   . 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 Marland Kitchen    Hospitalizations: No., Head Injury: No., Nervous System Infections: No., Immunizations up to date: Yes.    Copied from prior chart I began to see him on July 10, 2004. He apparently was seen by me when he was eight months of age with developmental delay. Seizure activity; however, did not began until he is 14, two days after starting amitriptyline. 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 His most recent CT scan of the brain was on September 29, 2009, and showed no significant changes.  EEG showed diffuse background slowing.   He was treated with Depakote, Topamax, and Lyrica, but suffered side effects  and agitation from those medications. He has taken Trileptal, Keppra, and Vimpat for quite some time.   [facility] for 6 days.for a phase 1 evaluation. MRI of the brain showed ex-vacuo dilatation of his ventricles and cortical atrophy. Volume loss most pronounced in the parietal  lobes bilaterally, temporal lobes with widening of the sylvian fissures and a paucity of white matter diffusely most pronounced again in the parietal lobes with patchy T2 hyperintensity. 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 There was no evidence of mesial temporal sclerosis.  Magnetoencephalogram showed epileptiform activity arising from the left posterior temple region with a field extending to the occipital lobe. This was not seen with concurrent routine EEG.  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 This was somewhat worse in the right than the left bilateral temporal cortex and temporal pole suggesting a longstanding insult.  Prolonged video EEG showed onset of bifrontal slowing that evolved into rhythmic activity with spikes of greater amplitude over the right central region with secondary generalization. Clinically, the patient has onset of a myoclonic jerk with his head turned to the left, movement of the left arm and leg, then tonic posturing followed by tonic-clonic generalized movements.  One day later he had bifrontal rhythmic beta range activity followed by decrement in voltage with fast beta range activity for about 30 seconds and then generalized rhythmic beta range activity. The patient had a slight head bob slowly lowered to the bed with stiffening of both upper extremities and tonic-clonic movement of both upper extremities followed by secondary generalization in his legs. A total of seven seizures were monitored. The last three were obscured by electrode artifact. Seizure onset appeared to occur from both frontal regions. This led to the recommendations to place the vagal nerve stimulator.  .  He has frequent ear infections, urinary tract infections, constipation and a hip fracture. He has had problems with neutropenia and thrombocytopenia.  He had some vomiting in June, 2010 and developed aspiration pneumonia. He tends to have a temperature of 99 in the late evening.   He had several episodes in which his left knee has been dislocated. This happened as recently as August 28, 2009. He sat down in a chair and his knee went out of place. EMS was called and he was taken to the ER. While waiting for the ER physician, Cardell stretched and his knee went back in to place. His mother said that he was subsequently seen by his orthopedist, who thinks that he may eventually need surgery on his knee.  Home sleep [facility] December 20, 2013 [facility] showed evidence of obstructive apnea with desaturations.  This is discussed in detail in the February 11, 2014 note in his chart.  He was prescribed CPAP and is able to wear it to some extent but does not have a high quality sleep.  In addition the study showed that when the vagal nerve stimulator discharges, there are brief periods of apnea corresponding to the discharge.  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 FOOT SURGERY    . FRACTURE SURGERY    . HIP SURGERY    . IMPLANTATION VAGAL NERVE STIMULATOR    . Pediatomy  2009  .  TONSILLECTOMY  2009  . TYMPANOSTOMY TUBE PLACEMENT  2009   Family History family history includes Cancer in his paternal grandmother; Diabetes in his paternal grandfather. Family history is negative for migraines, seizures, intellectual disabilities, blindness, deafness, birth defects, chromosomal disorder, or autism.  Social History Socioeconomic History  . Marital status: Single  . Years of education:  7  . Highest education level:  High school certificate  Occupational History  . Not employed  Tobacco Use  . Smoking status: Never Smoker  . Smokeless tobacco: Never Used  Substance and Sexual Activity  . Alcohol  use: No  . Drug use: No  . 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     He enjoys swimming and going to the beach.   Allergies Allergen Reactions  . Depakote [Divalproex Sodium] Other (See Comments)    Low Platelets  . Lyrica [Pregabalin] Other (See Comments)    Sleepiness  . Penicillins Hives and Rash    Has patient had a PCN reaction causing immediate rash, facial/tongue/throat swelling, SOB or lightheadedness with hypotension: Unknown Has patient had a PCN reaction causing severe rash involving mucus membranes or skin necrosis: Yes Has patient had a PCN reaction that required hospitalization: No Has patient had a PCN reaction occurring within the last 10 years: No If all of the above answers are "NO", then may proceed with Cephalosporin use.   . Topamax [Topiramate] Other (See Comments)    Weight Loss   Physical Exam Pulse 80   Wt 186 lb (84.4 kg)   BMI 32.95 kg/m   Not examined today  Assessment 1.  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  Discussion I am not certain why Chaunce is experiencing increasing frequency of seizures.  From time to time this is happened. We were able to prove that it is not related to function of his vagal nerve stimulator.  Plan I asked his mother to keep the January appointment so that we can reassess him and to stay in touch with me concerning the frequency and severity of his seizures.   Medication List   Accurate as of May 26, 2019  4:06 PM. If you have any questions, ask your nurse or doctor.    acetaminophen 500 MG tablet Commonly known as: TYLENOL Take 500 mg by mouth every 6 (six) hours as needed for mild pain, moderate pain, fever or headache.   albuterol (2.5 MG/3ML) 0.083% nebulizer solution Commonly known as: PROVENTIL Take 3 mLs (2.5 mg total) by nebulization every 6 (six) hours as needed for wheezing or shortness  of breath.   cetirizine 10 MG tablet Commonly known as: ZYRTEC Take 1 tablet (10 mg total) by mouth daily as needed for allergies.   chlorthalidone 25 MG tablet Commonly known as: HYGROTON Take 25 mg by mouth 2 (two) times daily.   cholecalciferol 1000 units tablet Commonly known as: VITAMIN D Take 1,000 Units by mouth daily.   Diastat AcuDial 20 MG Gel Generic drug: diazepam Give 15 mg rectally after 2 minutes of seizure.   docusate sodium 100 MG capsule Commonly known as: COLACE Take 200 mg by mouth daily.   Fluocinolone Acetonide Body 0.01 % Oil Commonly known as: Derma-Smoothe/FS Body Apply 1 application topically daily.   Foly Catheter 2-Way Pediatric Misc Please supply size 14 Foley catheter for home use in case of urinary retention   Keppra 1000 MG  tablet Generic drug: levETIRAcetam TAKE ONE AND ONE-HALF TABLET TWICE DAILY   Lactulose 20 GM/30ML Soln Take 15 mLs (10 g total) by mouth daily.   LORazepam 1 MG tablet Commonly known as: ATIVAN Take 1 tablet by mouth as needed for agitation.   Masks Misc Please supply masks for high risk of complications to COVID 19   Nayzilam 5 MG/0.1ML Soln Generic drug: Midazolam Place 5 mg into the nose once for 1 dose. If seizures persist may repeat in 5 minutes   Oxtellar XR 600 MG Tb24 Generic drug: OXcarbazepine ER Take 3 tablets by mouth at bedtime.   PEG 3350 17 GM/SCOOP Powd 17 GRAMS BY MOUTH DAILY AS NEEDED FOR MILD CONSTIPATION- CAN INCREASETO 4 CAPFULS DAILY IF NEEDED   potassium chloride 20 MEQ packet Commonly known as: KLOR-CON Take 20 mEq by mouth 2 (two) times daily.   promethazine 25 MG suppository Commonly known as: Phenergan Place 1 suppository (25 mg total) rectally every 6 (six) hours as needed for nausea or vomiting.   spironolactone 25 MG tablet Commonly known as: ALDACTONE Take 25 mg by mouth at bedtime.   tamsulosin 0.4 MG Caps capsule Commonly known as: Flomax Take 1 capsule (0.4 mg  total) by mouth daily after supper.   Vimpat 100 MG Tabs Generic drug: Lacosamide Take 1 tablet in the morning and 1 tablet at 3 PM   Vimpat 200 MG Tabs tablet Generic drug: lacosamide Take 2 tablets (400 mg total) by mouth at bedtime.    The medication list was reviewed and reconciled. All changes or newly prescribed medications were explained.  A complete medication list was provided to the patient/caregiver.  Deetta Perla MD

## 2019-05-26 NOTE — Telephone Encounter (Signed)
Spoke with mom about her phone message. She states that patient has had four seizures this month. She states that the seizures are lasting longer than normal. She states that two of them happened yesterday. She thinks that it may be the VNS battery. She states that he has had the VNS for 7 years. She thinks it may be the battery. She reports that he is taking Flomax in the evening to help you urinate,so she is not sure if that is washing the other medication out or not.  December 12 December 17- one minute and a half December 21 (2)- lasted over two minutes: gran mal  She states that he blinks like he has a headache. She states that the magnet she uses to stop the seizure, is not stopping the seizure. Please advise

## 2019-06-30 ENCOUNTER — Ambulatory Visit (INDEPENDENT_AMBULATORY_CARE_PROVIDER_SITE_OTHER): Payer: Medicaid Other | Admitting: Pediatrics

## 2019-07-03 ENCOUNTER — Encounter (INDEPENDENT_AMBULATORY_CARE_PROVIDER_SITE_OTHER): Payer: Self-pay | Admitting: Pediatrics

## 2019-07-03 ENCOUNTER — Ambulatory Visit (INDEPENDENT_AMBULATORY_CARE_PROVIDER_SITE_OTHER): Payer: Medicaid Other | Admitting: Pediatrics

## 2019-07-03 ENCOUNTER — Other Ambulatory Visit: Payer: Self-pay

## 2019-07-03 VITALS — BP 110/80 | HR 84 | Wt 186.0 lb

## 2019-07-03 DIAGNOSIS — G40209 Localization-related (focal) (partial) symptomatic epilepsy and epileptic syndromes with complex partial seizures, not intractable, without status epilepticus: Secondary | ICD-10-CM | POA: Diagnosis not present

## 2019-07-03 DIAGNOSIS — G808 Other cerebral palsy: Secondary | ICD-10-CM | POA: Diagnosis not present

## 2019-07-03 DIAGNOSIS — G40319 Generalized idiopathic epilepsy and epileptic syndromes, intractable, without status epilepticus: Secondary | ICD-10-CM

## 2019-07-03 DIAGNOSIS — G4731 Primary central sleep apnea: Secondary | ICD-10-CM | POA: Diagnosis not present

## 2019-07-03 DIAGNOSIS — F840 Autistic disorder: Secondary | ICD-10-CM | POA: Diagnosis not present

## 2019-07-03 NOTE — Progress Notes (Addendum)
Patient: Zachary [name] MRN: [id number] Sex: male DOB: [birth year]  Provider: Ellison Carwin, MD Location of Care: [facility]  Note type: Routine return visit  History of Present Illness: Referral Source: [name], MD History from: mother, patient and [facility] chart Chief Complaint: VNS/Epilepsy  Zachary [name] is a 29 y.o. male who returns June 28, 2019 for the first time since May 26, 2019.  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  He has not experienced any seizures since his last visit.  At that time he had a flurry of seizures (4 in a 10-day period).  His health is good.  He is sleeping well.  This is despite the fact that he has sleep apnea and will not keep his CPAP machine on his face.  He takes and tolerates his antiepileptic medications.  His vagal nerve stimulator is working well and is interrogated below.  There have been no seizures for her to determine whether or not swiping the stimulator device will abort his seizures.  Procedure: interrogation of vagal nerve stimulator  The implanted device is series 102, serial [id number], implanted May 23, 2011.  On interrogation:Normalamplitude 2.50 mA, frequency 20 Hz, pulse width 250 s, stimulation duration 7 seconds, stimulation interval 0.8 minutes. Magnetamplitude 2.75 mA, magnet stimulation duration 60 seconds, magnet pulse width 250 s.  Number of stimuli since lastinterrogationis12; total number since implantation:922.  Systemdiagnostics with an amplitude of 1.62milliamps again showed intact communication, output, and impedance. DC/ DC conversion code was 1. There is no indication to change the battery.  I discussed this with the representative for [facility].  It appears that he has about a year of reserve on his battery.  We would see him every 3 months to make certain that it has not changed.  It is amazing that he is gone 8 years  without needing a change despite the fact that he is on a fairly high duty cycle.  I explained to his mother that we do not want to allow this to run out because his seizures will increase, and we will have to start over reprogramming it from the beginning.  We refilled his medications on his last visit he does not need refills at this time.  Review of Systems: A complete review of systems was remarkable for patient is here to be seen for epilepsy. Mom reports that the patient has not had any seizures since his last visit. She states that she has no concerns at this time., all other systems reviewed and negative.  Past Medical History Diagnosis Date  . 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 Theressa Stamps. Patient's AHI was 39.6 RDI is 43.6 positional component. Patient will need desensitization and a gentle approach to CPAP titration.   . CP (cerebral palsy) (HCC)   . Fracture of femur, intertrochanteric, closed (HCC)   . Hypertension   . MR (mental retardation)   . Recurrent apnea   . Seizures (HCC)   . 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 Marland Kitchen    Hospitalizations: No., Head Injury: No., Nervous System Infections: No., Immunizations up to date: Yes.    Copied from prior chart I began to see him on July 10, 2004. He apparently was seen by me when he was eight months of age with developmental delay. Seizure activity; however, did  not began until he is 14, two days after starting amitriptyline. 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 His most recent CT scan of the brain was on September 29, 2009, and showed no significant changes.  EEG showed diffuse background slowing.   He was treated  with Depakote, Topamax, and Lyrica, but suffered side effects and agitation from those medications. He has taken Trileptal, Keppra, and Vimpat for quite some time.   He was seen at [facility] on Oct 27, 2010 for 6 days.for a phase 1 evaluation. MRI of the brain showed ex-vacuo dilatation of his ventricles and cortical atrophy. Volume loss most pronounced in the parietal lobes bilaterally, temporal lobes with widening of the sylvian fissures and a paucity of white matter diffusely most pronounced again in the parietal lobes with patchy T2 hyperintensity. 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 There was no evidence of mesial temporal sclerosis.  Magnetoencephalogram showed epileptiform activity arising from the left posterior temple region with a field extending to the occipital lobe. This was not seen with concurrent routine EEG.  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 This was somewhat worse in the right than the left bilateral temporal cortex and temporal pole suggesting a longstanding insult.  Prolonged video EEG showed onset of bifrontal slowing that evolved into rhythmic activity with spikes of greater amplitude over the right central region with secondary generalization. Clinically, the patient has onset of a myoclonic jerk with his head turned to the left, movement of the left arm and leg, then tonic posturing followed by tonic-clonic generalized movements.  One day later he had bifrontal rhythmic beta range activity followed by decrement in voltage with fast beta range activity for about 30 seconds and then generalized rhythmic beta range activity. The patient had a slight head bob slowly lowered to the bed with stiffening of both upper extremities and tonic-clonic movement of both upper extremities followed by  secondary generalization in his legs. A total of seven seizures were monitored. The last three were obscured by electrode artifact. Seizure onset appeared to occur from both frontal regions. This led to the recommendations to place the vagal nerve stimulator.  .  He has frequent ear infections, urinary tract infections, constipation and a hip fracture. He has had problems with neutropenia and thrombocytopenia. He had some vomiting in June, 2010 and developed aspiration pneumonia. He tends to have a temperature of 99 in the late evening.   Hehad several episodes in which his left knee has been dislocated. This happened as recently as August 28, 2009. He sat down in a chair and his knee went out of place. EMS was called and he was taken to the ER. While waiting for the ER physician, Ernestine stretched and his knee went back in to place. His mother said that he was subsequently seen by his orthopedist, who thinks that he may eventually need surgery on his knee.  Home sleep study December 20, 2013 through [facility] showed evidence of obstructive apnea with desaturations. This is discussed in detail in the February 11, 2014 note in his chart. He was prescribed CPAP and is able to wear it to some extent but does not have a high quality sleep.  In addition the study showed that when the vagal nerve stimulator discharges, there are brief periods of apnea corresponding to the discharge.  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 FOOT SURGERY    . FRACTURE SURGERY    . HIP SURGERY    . IMPLANTATION VAGAL NERVE STIMULATOR    . Pediatomy  2009  . TONSILLECTOMY  2009  . TYMPANOSTOMY TUBE PLACEMENT  2009   Family History family history includes Cancer in his paternal grandmother; Diabetes in his paternal grandfather. Family history is negative for migraines,  seizures, intellectual disabilities, blindness, deafness, birth defects, chromosomal disorder, or autism.  Social History  Socioeconomic History  . Marital status: Single  . Years of education:  70  . Highest education level:  High school certificate  Occupational History  . Not employed  Tobacco Use  . Smoking status: Never Smoker  . Smokeless tobacco: Never Used  Substance and Sexual Activity  . Alcohol use: No  . Drug use: No  . 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     He enjoys swimming and going to the beach.   Allergies Allergen Reactions  . Depakote Divalproex Sodium Other (See Comments)    Low Platelets  . Lyrica pregabalin Other (See Comments)    Sleepiness  . Penicillins Hives and Rash    Has patient had a PCN reaction causing immediate rash, facial/tongue/throat swelling, SOB or lightheadedness with hypotension: Unknown Has patient had a PCN reaction causing severe rash involving mucus membranes or skin necrosis: Yes Has patient had a PCN reaction that required hospitalization: No Has patient had a PCN reaction occurring within the last 10 years: No If all of the above answers are "NO", then may proceed with Cephalosporin use.   . Topamax topiramate Other (See Comments)    Weight Loss   Physical Exam BP 110/80   Pulse 84   Wt 186 lb (84.4 kg)   BMI 32.95 kg/m   General: alert, well developed, well nourished, in no acute distress, black hair, brown eyes, right handed Head: normocephalic, no dysmorphic features Ears, Nose and Throat: Otoscopic: tympanic membranes normal; pharynx: oropharynx is pink without exudates or tonsillar hypertrophy Neck: supple, full range of motion, no cranial or cervical bruits Respiratory: auscultation clear Cardiovascular: no murmurs, pulses are normal Musculoskeletal: no skeletal deformities or apparent scoliosis Skin: no rashes or  neurocutaneous lesions  Neurologic Exam  Mental Status: alert; does not turn to look at me when his name is called, does not follow commands, does not speak. 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 2.  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 3.  Obstructive sleep apnea, G47.33.  Discussion Zachary [name] is stable at this time medically and neurologically.  His seizures wax and wane.  There is no reason to change his current treatment.  Plan He will return to see me in 3 months.  I will see him sooner based on clinical need.   Medication List   Accurate as of July 03, 2019  8:38 AM. If you have any questions, ask your nurse or doctor.    acetaminophen 500 MG tablet Commonly known as: TYLENOL Take 500 mg by mouth every 6 (six) hours as needed for mild pain, moderate pain, fever or headache.   albuterol (2.5 MG/3ML) 0.083% nebulizer solution Commonly known as: PROVENTIL Take 3 mLs (2.5 mg total) by nebulization every 6 (six) hours as needed for wheezing or shortness of  breath.   cetirizine 10 MG tablet Commonly known as: ZYRTEC Take 1 tablet (10 mg total) by mouth daily as needed for allergies.   chlorthalidone 25 MG tablet Commonly known as: HYGROTON Take 25 mg by mouth 2 (two) times daily.   cholecalciferol 1000 units tablet Commonly known as: VITAMIN D Take 1,000 Units by mouth daily.   Diastat AcuDial 20 MG Gel Generic drug: diazepam Give 15 mg rectally after 2 minutes of seizure.   docusate sodium 100 MG capsule Commonly known as: COLACE Take 200 mg by mouth daily.   Keppra 1000 MG tablet Generic drug: levETIRAcetam TAKE ONE AND ONE-HALF TABLET TWICE DAILY   LORazepam 1 MG tablet Commonly known as: ATIVAN Take 1 tablet by mouth as needed for agitation.   Nayzilam 5 MG/0.1ML Soln Generic drug: Midazolam Place 5 mg into the nose once for 1 dose. If seizures persist may repeat in 5 minutes   Oxtellar XR 600 MG Tb24 Generic drug: OXcarbazepine ER Take 3 tablets by mouth at bedtime.   PEG 3350 17 GM/SCOOP Powd 17 GRAMS BY MOUTH DAILY AS NEEDED FOR MILD CONSTIPATION- CAN INCREASETO 4 CAPFULS DAILY IF NEEDED   potassium chloride 20 MEQ packet Commonly known as: KLOR-CON Take 20 mEq by mouth 2 (two) times daily.   promethazine 25 MG suppository Commonly known as: Phenergan Place 1 suppository (25 mg total) rectally every 6 (six) hours as needed for nausea or vomiting.   spironolactone 25 MG tablet Commonly known as: ALDACTONE Take 25 mg by mouth at bedtime.   tamsulosin 0.4 MG Caps capsule Commonly known as: Flomax Take 1 capsule (0.4 mg total) by mouth daily after supper.   Vimpat 100 MG Tabs Generic drug: Lacosamide Take 1 tablet in the morning and 1 tablet at 3 PM   Vimpat 200 MG Tabs tablet Generic drug: lacosamide Take 2 tablets (400 mg total) by mouth at bedtime.     The medication list was reviewed and reconciled. All changes or newly prescribed medications were explained.  A complete medication list was  provided to the patient/caregiver.  Deetta Perla MD

## 2019-07-03 NOTE — Patient Instructions (Signed)
I am glad that Zachary [name] has not had any seizures since I last saw him.  This is going to fluctuate.  Overall things are going well.  The VNS is working well.  At some point given that we are over 8 years into his use, we are going to have to replace it and there will be some issues associated with the leads that are on his vagal nerve.  I discussed that with you today.

## 2019-08-10 ENCOUNTER — Telehealth (INDEPENDENT_AMBULATORY_CARE_PROVIDER_SITE_OTHER): Payer: Self-pay | Admitting: Pediatrics

## 2019-08-10 NOTE — Telephone Encounter (Signed)
Spoke with mom about her phone message. She states that she is unsure of what caused his seizure. She states that he had the [place] shot last week so is that a possibility of him having the seizure. She states that the magnet did not work to stop the seizure. She is needing a new magnet, possibly. She states that she was washing the patient's ear and she saw crusted blood but she states that he scratches and hits his face so hard that he may have caused him to bleed. They have an appt to see his doctor about his ear. She states that she is off today and tomorrow in case Dr. Sharene Skeans wants to see the patient.

## 2019-08-10 NOTE — Telephone Encounter (Signed)
  Who's calling (name and relationship to patient) :mom/[name] Enamorado   Best contact number:[phone]  Provider they see:Dr. Sharene Skeans   Reason for call:Oreste had a really bad seizure last night lasting around 2 or more min. He has the [place] shot last week. Mom stated that she has never seen a seizure like this before. His ear is now bleeding. She also wants to know if she can have another magnet. Mom would like to discuss other things. Please advise mom.       PRESCRIPTION REFILL ONLY  Name of prescription:  Pharmacy:

## 2019-08-10 NOTE — Telephone Encounter (Signed)
I called mom we will put a battery upfront for [place].

## 2019-08-11 ENCOUNTER — Other Ambulatory Visit: Payer: Self-pay | Admitting: Pediatrics

## 2019-08-24 ENCOUNTER — Telehealth (INDEPENDENT_AMBULATORY_CARE_PROVIDER_SITE_OTHER): Payer: Self-pay | Admitting: Pediatrics

## 2019-08-24 NOTE — Telephone Encounter (Signed)
L/M informing mom that we received her message. Invited her to call back to discuss details further

## 2019-08-24 NOTE — Telephone Encounter (Signed)
I spoke with mom for 5 minutes.  I would have given the rectal Diastat after 2 minutes of seizures.  She typically waits until 7 which I think is too long typically his seizures and after 2 minutes and so it works out fairly well.  I asked her to consider getting the Diastat out and giving it between 2 and 3 minutes in order to stop his seizure.  I am not going to recommend any long-term change in his medication.  Mother will implement this plan.

## 2019-08-24 NOTE — Telephone Encounter (Signed)
  Who's calling (name and relationship to patient) :mom / Aquilla Solian  Best contact [phone]  Provider they see:Dr. Sharene Skeans   Reason for call:Dr. Sharene Skeans     PRESCRIPTION REFILL ONLY  Name of prescription:message continued. Mom stated that Erskine had a very bad seizure on 08/22/19 lasting around 5 min and wanted to let Dr. Sharene Skeans know and would like a call back as soon as possible.please advise mom.   Pharmacy:

## 2019-08-24 NOTE — Telephone Encounter (Signed)
  Who's calling (name and relationship to patient) :mom/[name] Lesueur  Best contact number:[phone]  Provider they see:Dr. Sharene Skeans   Reason for call:Mom wanted to call and let Dr.     Collene Mares REFILL ONLY  Name of prescription:  Pharmacy:

## 2019-09-29 ENCOUNTER — Other Ambulatory Visit (INDEPENDENT_AMBULATORY_CARE_PROVIDER_SITE_OTHER): Payer: Self-pay | Admitting: Pediatrics

## 2019-09-29 DIAGNOSIS — G40209 Localization-related (focal) (partial) symptomatic epilepsy and epileptic syndromes with complex partial seizures, not intractable, without status epilepticus: Secondary | ICD-10-CM

## 2019-09-29 NOTE — Telephone Encounter (Signed)
Please send to the pharmacy °

## 2019-10-01 ENCOUNTER — Other Ambulatory Visit: Payer: Self-pay

## 2019-10-01 ENCOUNTER — Encounter (INDEPENDENT_AMBULATORY_CARE_PROVIDER_SITE_OTHER): Payer: Self-pay | Admitting: Pediatrics

## 2019-10-01 ENCOUNTER — Ambulatory Visit (INDEPENDENT_AMBULATORY_CARE_PROVIDER_SITE_OTHER): Payer: Medicaid Other | Admitting: Pediatrics

## 2019-10-01 VITALS — BP 108/70 | HR 76 | Wt 186.0 lb

## 2019-10-01 DIAGNOSIS — G40319 Generalized idiopathic epilepsy and epileptic syndromes, intractable, without status epilepticus: Secondary | ICD-10-CM | POA: Diagnosis not present

## 2019-10-01 DIAGNOSIS — R451 Restlessness and agitation: Secondary | ICD-10-CM | POA: Diagnosis not present

## 2019-10-01 DIAGNOSIS — G40209 Localization-related (focal) (partial) symptomatic epilepsy and epileptic syndromes with complex partial seizures, not intractable, without status epilepticus: Secondary | ICD-10-CM | POA: Diagnosis not present

## 2019-10-01 DIAGNOSIS — G4733 Obstructive sleep apnea (adult) (pediatric): Secondary | ICD-10-CM

## 2019-10-01 DIAGNOSIS — F840 Autistic disorder: Secondary | ICD-10-CM

## 2019-10-01 DIAGNOSIS — G808 Other cerebral palsy: Secondary | ICD-10-CM

## 2019-10-01 MED ORDER — KEPPRA 1000 MG PO TABS: ORAL_TABLET | ORAL | 5 refills | Status: DC

## 2019-10-01 MED ORDER — LORAZEPAM 1 MG PO TABS: ORAL_TABLET | ORAL | 5 refills | Status: DC

## 2019-10-01 MED ORDER — OXTELLAR XR 600 MG PO TB24: 3.0000 | ORAL_TABLET | Freq: Every day | ORAL | 5 refills | Status: DC

## 2019-10-01 NOTE — Patient Instructions (Signed)
It was a pleasure to see you today.  The vagal nerve stimulator needs to be replaced.  I will contact Dr. Manfred Shirts office today we will arrange at the soonest possible.  I know that he has some dental work coming up at [facility].  We will be happy to cooperate in any way that we can.  I filled out the form for community service and will attach his medications to it.  Overall I am pleased that the frequency of his seizures remains relatively low although I understand that there are times when he has clusters that are frightening.  I would make no changes in his current medications.  I would like to see him again in 4 months time.  The vagal nerve stimulator when it is replaced should be returned to the same settings.  This will be sent to Dr. Manfred Shirts office.

## 2019-10-01 NOTE — Progress Notes (Signed)
Patient: Zachary [name] MRN: [id number] Sex: male DOB: January 26, 1991  Provider: Ellison CarwinWilliam Galena Logie, MD [facility] Care[facility]  Note type: Routine return visit  History of Present Illness: Referral Source: Georgiann HahnAndres Ramgoolam, MD History from: mother, patient and [facility] chart Chief Complaint: VNS/Epilepsy  Zachary [name] is a 29 y.o. male who was evaluated January 31, 2020 for the first time since July 03, 2019.  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  His seizures most recently have been generalized.  He had a prolonged seizure on August 22, 2019 at 5 minutes in duration.  Stimulating his vagal nerve stimulator with a magnet did not help.  For reasons that I do not understand she did not give Diastat or Nayzilam.  She was under the impression that she should not get the medication until he had 7 minutes of seizures which is too long.  I asked her to consider giving medication between 2 and 3 minutes.  She has both Diastat and Nayzilam.  He is experiencing about 2 seizures a month.  His last was 2 weeks ago.  Swiping his vagal nerve stimulator helped on that one.  It lasted about a minute and a half.  He is scheduled to have dental [facility].  I will cooperate with the dental team in any way that I can.  His general health has been good.  He is sleeping well although his sleep apnea is a problem but he will not keep the CPAP on.  His weight is stable.  He has been immunized for Covid.  Almost every late afternoon he becomes agitated.  He likes to be taken outside.  With the nice weather, that becomes possible.  When he gets taken back inside he becomes agitated.  Mother has some lorazepam to give him which is not working as well as it used to.  In part this is because of tolerance.  His vagal nerve stimulator has been in place for 8-1/2 years.  Procedure: interrogation of  vagal nerve stimulator  The implanted device is series 102, serial [id number], implanted May 23, 2011.  On interrogation:Normalamplitude 2.50 mA, frequency 20 Hz, pulse width 250 s, stimulation duration 7 seconds, stimulation interval 0.8 minutes. Magnetamplitude 2.75 mA, magnet stimulation duration 60 seconds, magnet pulse width 250 s.  Number of stimuli since lastinterrogationis22; total number since implantation:944.  Systemdiagnostics with an amplitude of 1.400milliamps again showed intact communication, output, and impedance. DC/ DC conversion code was 1. There is an indication to change the battery.  Review of Systems: A complete review of systems was remarkable for patient is here to be seen for VNS and epilepsy. Mom reports that the patient had a seizure about two weeks ago. She states that he woke up having the seizure.She states that the seizure lasted one and ahalf minutes. She reports that she made Dr. Sharene SkeansHickling aware of the seizure. She has no other concerns at this time., all other systems reviewed and negative.  Past Medical History Diagnosis Date  . 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 Theressa StampsJames Crowe. Patient's AHI was 39.6 RDI is 43.6 positional component. Patient will need desensitization and a gentle approach to CPAP titration.   . CP (cerebral palsy) (HCC)   . Fracture of femur, intertrochanteric, closed (HCC)   . Hypertension   . MR (mental retardation)   . Recurrent apnea   .  Seizures (HCC)   . 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 Marland Kitchen    Hospitalizations: No., Head Injury: No., Nervous System Infections: No., Immunizations up to date: Yes.    Copied from prior chart I began to see him on July 10, 2004. He apparently was seen by me when he was eight months of age with developmental delay. Seizure activity;  however, did not began until he is 14, two days after starting amitriptyline. 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 His most recent CT scan of the brain was on September 29, 2009, and showed no significant changes.  EEG showed diffuse background slowing.   He was treated with Depakote, Topamax, and Lyrica, but suffered side effects and agitation from those medications. He has taken Trileptal, Keppra, and Vimpat for quite some time.   He was seen at [facility] on Oct 27, 2010 for 6 days.for a phase 1 evaluation. MRI of the brain showed ex-vacuo dilatation of his ventricles and cortical atrophy. Volume loss most pronounced in the parietal lobes bilaterally, temporal lobes with widening of the sylvian fissures and a paucity of white matter diffusely most pronounced again in the parietal lobes with patchy T2 hyperintensity. 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 There was no evidence of mesial temporal sclerosis.  Magnetoencephalogram showed epileptiform activity arising from the left posterior temple region with a field extending to the occipital lobe. This was not seen with concurrent routine EEG.  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 This was somewhat worse in the right than the left bilateral temporal cortex and temporal pole suggesting a longstanding insult.  Prolonged video EEG showed onset of bifrontal slowing that evolved into rhythmic activity with spikes of greater amplitude over the right central region with secondary generalization. Clinically, the patient has onset of a myoclonic jerk with his head turned to the left, movement of the left arm and leg,  then tonic posturing followed by tonic-clonic generalized movements.  One day later he had bifrontal rhythmic beta range activity followed by decrement in voltage with fast beta range activity for about 30 seconds and then generalized rhythmic beta range activity. The patient had a slight head bob slowly lowered to the bed with stiffening of both upper extremities and tonic-clonic movement of both upper extremities followed by secondary generalization in his legs. A total of seven seizures were monitored. The last three were obscured by electrode artifact. Seizure onset appeared to occur from both frontal regions. This led to the recommendations to place the vagal nerve stimulator.  .  He has frequent ear infections, urinary tract infections, constipation and a hip fracture. He has had problems with neutropenia and thrombocytopenia. He had some vomiting in June, 2010 and developed aspiration pneumonia. He tends to have a temperature of 99 in the late evening.   Hehad several episodes in which his left knee has been dislocated. This happened as recently as August 28, 2009. He sat down in a chair and his knee went out of place. EMS was called and he was taken to the ER. While waiting for the ER physician, Zachary [name] stretched and his  knee went back in to place. His mother said that he was subsequently seen by his orthopedist, who thinks that he may eventually need surgery on his knee.  Home sleep study December 20, 2013 through [facility] showed evidence of obstructive apnea with desaturations. This is discussed in detail in the February 11, 2014 note in his chart. He was prescribed CPAP and is able to wear it to some extent but does not have a high quality sleep. In addition the study showed that when the vagal nerve stimulator discharges, there are brief periods of apnea corresponding to the discharge.  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 FOOT SURGERY    . FRACTURE SURGERY    . HIP SURGERY    . IMPLANTATION VAGAL NERVE STIMULATOR    . Pediatomy  2009  . TONSILLECTOMY  2009  . TYMPANOSTOMY TUBE PLACEMENT  2009   Family History family history includes Cancer in his paternal grandmother; Diabetes in his paternal grandfather. Family history is negative for migraines, seizures, intellectual disabilities, blindness, deafness, birth defects, chromosomal disorder, or autism.  Social History  Socioeconomic History  . Marital status: Single  . Years of education:  75  . Highest education level:  High school certificate  Occupational History  . Not employed  Tobacco Use  . Smoking status: Never Smoker  . Smokeless tobacco: Never Used  Substance and Sexual Activity  . Alcohol use: No  . Drug use: No  . 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     He enjoys swimming and going to the beach.   Allergies Allergen Reactions  . Depakote [Divalproex Sodium] Other (See Comments)    Low Platelets  . Lyrica [Pregabalin] Other (See Comments)    Sleepiness  . Penicillins Hives and Rash    Has patient had a PCN reaction causing immediate rash, facial/tongue/throat swelling, SOB or lightheadedness with hypotension: Unknown Has patient had a PCN reaction causing severe rash involving mucus membranes or skin necrosis: Yes Has patient had a PCN reaction that required hospitalization: No Has patient had a PCN reaction occurring within the last 10 years: No If all of the above answers are "NO", then may proceed with Cephalosporin use.   . Topamax [Topiramate] Other (See Comments)    Weight Loss   Physical Exam BP 108/70   Pulse 76   Wt 186 lb (84.4 kg)   BMI 32.95 kg/m   General: alert, well developed, well nourished, in no acute distress, black hair,  brown eyes, right handed Head: normocephalic, no dysmorphic features Ears, Nose and Throat: Otoscopic: tympanic membranes normal; pharynx: oropharynx is pink without exudates or tonsillar hypertrophy Neck: supple, full range of motion, no cranial or cervical bruits Respiratory: auscultation clear Cardiovascular: no murmurs, pulses are normal Musculoskeletal: no skeletal deformities or apparent scoliosis Skin: no rashes or neurocutaneous lesions  Neurologic Exam  Mental Status: alert; does not turn to look at me when his name is called, does not follow commands, does not speak. Cranial Nerves: visual fields are full to double simultaneous stimuli; extraocular movements are full and dysconjugate; he has left eye amblyopia the right eye fixes and follows, pupils are round reactive to light; funduscopic examination shows bilateral positive red reflex; symmetric, impassive facial strength; midline tongue; inconsistently localizes sound bilaterally  Motor: quadriparesis with spasticity and increased tone, left greater than right, uses his right hand to push me away and to grasp objects Sensory: withdraws x4 Coordination: unable to test, but he does not show tremor in the right hand or on the left Gait and Station: diplegic shuffling gait Reflexes: symmetric and diminished bilaterally; clonus, couple of beats bilaterally; bilateral neutral plantar responses  Assessment 1.  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 2.  Focal epilepsy with impairment of consciousness, intractable with secondary generalization, G40.219. 3.  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 4.  Autism spectrum disorder with accompanying intellectual impairment and language impairment requiring very substantial support (level 3), F84.0. 5.  Obstructive sleep apnea, G47.33.  Discussion Zachary [name] is physically neurologically stable.  His vagal nerve stimulator has worked extremely well to limit the total number of  seizures.  Is at the end of service and needs to be replaced as soon as possible.  He has a model 102.  I do not know if that means there is 1 or 2 wires around his vagal nerve stimulator.  This may limit the options that we have for replacement.  If we can, I would like to place the 106 but it is a single wire.  I do not plan to make any changes in his medication.  I think we are doing as well as we can.  Plan I have ordered replacement of his vagal nerve stimulator and requested Dr. Monika Salk to perform this as soon as possible.  I do not know how long it is before the battery will expire.  This is the first indication that I have had that it needs to be changed.  This was not the case 3 months ago.  Greater than 50% of a 25-minute visit was spent counseling coordination of care, filling out forms for his community service plan, writing prescriptions for Oxtellar and Keppra, and requesting surgical replacement of his vagal nerve stimulator.   Medication List     Accurate as of October 01, 2019  8:40 AM. If you have any questions, ask your nurse or doctor.    acetaminophen 500 MG tablet Commonly known as: TYLENOL Take 500 mg by mouth every 6 (six) hours as needed for mild pain, moderate pain, fever or headache.   albuterol (2.5 MG/3ML) 0.083% nebulizer solution Commonly known as: PROVENTIL Take 3 mLs (2.5 mg total) by nebulization every 6 (six) hours as needed for wheezing or shortness of breath.   cetirizine 10 MG tablet Commonly known as: ZYRTEC Take 1 tablet (10 mg total) by mouth daily as needed for allergies.   chlorthalidone 25 MG tablet Commonly known as: HYGROTON Take 25 mg by mouth 2 (two) times daily.   cholecalciferol 1000 units tablet Commonly known as: VITAMIN D Take 1,000 Units by mouth daily.   Ciprodex OTIC suspension Generic drug: ciprofloxacin-dexamethasone PLACE 4 DROPS IN LEFT EAR TWICE DAILY FOR 7 DAYS   Diastat AcuDial 20 MG Gel Generic drug:  diazepam Give 15 mg rectally after 2 minutes of seizure.   docusate sodium 100 MG capsule Commonly known as: COLACE Take 200 mg by mouth daily.   Keppra 1000 MG tablet Generic drug: levETIRAcetam TAKE ONE AND ONE-HALF TABLET TWICE DAILY   LORazepam 1 MG tablet Commonly known as: ATIVAN Take 1 tablet by mouth as needed for agitation.   Nayzilam 5 MG/0.1ML Soln Generic drug: Midazolam Place 5 mg into the nose once for 1 dose. If seizures persist may repeat in 5 minutes  Oxtellar XR 600 MG Tb24 Generic drug: OXcarbazepine ER Take 3 tablets by mouth at bedtime.   PEG 3350 17 GM/SCOOP Powd 17 GRAMS BY MOUTH DAILY AS NEEDED FOR MILD CONSTIPATION- CAN INCREASETO 4 CAPFULS DAILY IF NEEDED   potassium chloride 20 MEQ packet Commonly known as: KLOR-CON Take 20 mEq by mouth 2 (two) times daily.   promethazine 25 MG suppository Commonly known as: Phenergan Place 1 suppository (25 mg total) rectally every 6 (six) hours as needed for nausea or vomiting.   spironolactone 25 MG tablet Commonly known as: ALDACTONE Take 25 mg by mouth at bedtime.   tamsulosin 0.4 MG Caps capsule Commonly known as: Flomax Take 1 capsule (0.4 mg total) by mouth daily after supper.   Vimpat 100 MG Tabs Generic drug: Lacosamide TAKE ONE TABLET IN THE MORNING AND TAKE ONE TABLET AT 3PM   Vimpat 200 MG Tabs tablet Generic drug: lacosamide TAKE 2 TABLETS AT BEDTIME    The medication list was reviewed and reconciled. All changes or newly prescribed medications were explained.  A complete medication list was provided to the patient/caregiver.  Deetta Perla MD

## 2019-10-05 ENCOUNTER — Telehealth (INDEPENDENT_AMBULATORY_CARE_PROVIDER_SITE_OTHER): Payer: Self-pay | Admitting: Pediatrics

## 2019-10-05 NOTE — Telephone Encounter (Signed)
  Who's calling (name and relationship to patient) :  [name] (mom)  Best contact number:  [phone]  Provider they see:  Dr. Sharene Skeans  Reason for call:  Mom states that she was instructed to call on Monday morning if she had not heard anything from the referral that was placed to [facility]. She has not heard from them.     PRESCRIPTION REFILL ONLY  Name of prescription:  Pharmacy:

## 2019-10-05 NOTE — Telephone Encounter (Signed)
L/M informing mom that I did receive her message. Also, informed her that the referral was sent to [facility] the same day that they were seen. Informed her that they will call them to schedule the appointment within a week. Invited her to call back if she had any further questions

## 2019-10-06 ENCOUNTER — Ambulatory Visit (INDEPENDENT_AMBULATORY_CARE_PROVIDER_SITE_OTHER): Payer: Medicaid Other | Admitting: Pediatrics

## 2019-10-06 ENCOUNTER — Encounter: Payer: Self-pay | Admitting: Pediatrics

## 2019-10-06 ENCOUNTER — Other Ambulatory Visit: Payer: Self-pay

## 2019-10-06 VITALS — BP 122/80 | Ht 63.5 in | Wt 187.0 lb

## 2019-10-06 DIAGNOSIS — Z01818 Encounter for other preprocedural examination: Secondary | ICD-10-CM | POA: Diagnosis not present

## 2019-10-06 NOTE — Progress Notes (Signed)
Pre op dental  Nutrition: Ensure--2 cans per day  Present equipment: Pulse ox Oxygen at home Nebulizer    Subjective:    Zachary [name]  is a 29 y.o. male who presents to the office today for a preoperative consultation at the request of surgeon --dental who plans on performing Full mouth Rehabilitation in a few weeks. Comorbid conditions: MR-severe Hypertension Seizure Sleep apnea Cerebral palsy Vagus Nerve Stimulator for seizure control Autism  The following portions of the patient's history were reviewed and updated as appropriate: allergies, current medications, past family history, past medical history, past social history, past surgical history and problem list.     Objective:    General appearance: alert and stable mental status Head: Normocephalic, without obvious abnormality, atraumatic Ears: normal TM's and external ear canals both ears Nose: Nares normal. Septum midline. Mucosa normal. No drainage or sinus tenderness. Throat: difficult to assess Neck: no adenopathy, no carotid bruit, supple, symmetrical, trachea midline and thyroid not enlarged, symmetric, no tenderness/mass/nodules Lungs: clear to auscultation bilaterally Heart: regular rate and rhythm, S1, S2 normal. Abdomen: soft, non-tender; bowel sounds normal; no masses,  no organomegaly Extremities: extremities normal, atraumatic, no cyanosis or edema   Imaging Chest x-ray: sent    Assessment:     Dental caries with seizure disorder and MR    Plan:   Cleared for General anesthesia with knowledge of significant Comorbid condition---Epilepsy/VNS/Hypertension/MR/Recurrent apnea and autism  Previous surgeries with no anesthetic complications.

## 2019-10-06 NOTE — Patient Instructions (Signed)
Dental Caries  Dental caries are spots of decay (cavities) in teeth. They are in the outer layer of your tooth (enamel). Treat them as soon as you can. If they are not treated, they can spread decay and lead to painful infection. Follow these instructions at home: General instructions  Take good care of your mouth and teeth. This keeps them healthy. ? Brush your teeth 2 times a day. Use toothpaste with fluoride in it. ? Floss your teeth once a day.  If your dentist prescribed an antibiotic medicine to treat an infection, take it as told. Do not stop taking the antibiotic even if your condition gets better.  Keep all follow-up visits as told by your dentist. This is important. This includes all cleanings. Preventing dental caries   Brush your teeth every morning and night. Use fluoride toothpaste.  Get regular dental cleanings.  If you are at risk of dental caries. ? Wash your mouth with prescription mouthwash (chlorhexidine). ? Put topical fluoride on your teeth.  Drink water with fluoride in it.  Drink water instead of sugary drinks.  Eat healthy meals and snacks. Contact a doctor if:  You have symptoms of tooth decay. Summary  Dental caries are spots of decay (cavities) in teeth. They are in the outer layer of your tooth.  Take an antibiotic to treat an infection, if told by your dentist. Do not stop taking the antibiotic even if your condition gets better.  Regular dental cleanings and brushing can help prevent dental caries. This information is not intended to replace advice given to you by your health care provider. Make sure you discuss any questions you have with your health care provider. Document Revised: 05/03/2017 Document Reviewed: 02/05/2016 Elsevier Patient Education  2020 Elsevier Inc.  

## 2019-10-07 NOTE — Telephone Encounter (Signed)
  Who's calling (name and relationship to patient) : Archie Patten (Mom)  Best contact [phone]  Provider they see: Dr. Sharene Skeans  Reason for call: Patient has several questions regarding Thee two surgeries that are coming up patient has a dental surgery and the upcoming surgery at [facility] she is confused about the timing of the two surgeriees and would love some advise      PRESCRIPTION REFILL ONLY  Name of prescription:  Pharmacy:

## 2019-10-09 ENCOUNTER — Other Ambulatory Visit: Payer: Self-pay | Admitting: Nephrology

## 2019-10-09 ENCOUNTER — Ambulatory Visit
Admission: RE | Admit: 2019-10-09 | Discharge: 2019-10-09 | Disposition: A | Discharge status: Home / Self Care | Payer: Medicaid Other | Source: Ambulatory Visit | Attending: Nephrology | Admitting: Nephrology

## 2019-10-09 ENCOUNTER — Other Ambulatory Visit: Payer: Self-pay

## 2019-10-09 DIAGNOSIS — R0902 Hypoxemia: Secondary | ICD-10-CM

## 2019-10-09 NOTE — Telephone Encounter (Signed)
I contacted [facility] and they were setting up the visit.  I told him that I would prefer the 106 model but I did not know if that would work replacing a 102.  I called mom to let her know that we had discussed this with [facility] and they should be getting up with her soon.

## 2019-10-12 NOTE — Telephone Encounter (Signed)
Mom called to let Dr. Sharene Skeans know that she still has not been able to get Vencil scheduled.

## 2019-10-12 NOTE — Telephone Encounter (Signed)
Spoke with mom to give her number to [facility] and she states that she has already talked to them. She states that she was told that she had to wait for the scheduler to call her to schedule Zachary [name]'s appointment. She was told to call us if she had not heard anything by Monday.

## 2019-10-13 ENCOUNTER — Other Ambulatory Visit (INDEPENDENT_AMBULATORY_CARE_PROVIDER_SITE_OTHER): Payer: Self-pay | Admitting: Pediatrics

## 2019-10-13 DIAGNOSIS — G40209 Localization-related (focal) (partial) symptomatic epilepsy and epileptic syndromes with complex partial seizures, not intractable, without status epilepticus: Secondary | ICD-10-CM

## 2019-10-13 NOTE — Telephone Encounter (Signed)
Please send to the pharmacy °

## 2019-10-13 NOTE — Telephone Encounter (Signed)
Office visit has been scheduled for May 25 and replacement for May 26.

## 2019-10-27 DIAGNOSIS — Z9689 Presence of other specified functional implants: Secondary | ICD-10-CM | POA: Insufficient documentation

## 2019-10-30 ENCOUNTER — Telehealth (INDEPENDENT_AMBULATORY_CARE_PROVIDER_SITE_OTHER): Payer: Self-pay | Admitting: Pediatrics

## 2019-10-30 NOTE — Telephone Encounter (Signed)
Who's calling (name and relationship to patient) : Zachary [name] mom   Best contact number: [phone]  Provider they see: Dr. Sharene Skeans   Reason for call: VNS replacement has been done. Setting have been placed. Mom wanted to make sure the settings were right and wanted to know if Dr. Sharene Skeans needed to see him sooner.   [name] also had a seizure the day after the replacement.   Call ID:      PRESCRIPTION REFILL ONLY  Name of prescription:  Pharmacy:

## 2019-11-02 MED ORDER — CETIRIZINE HCL 10 MG PO TABS: 10.0000 mg | ORAL_TABLET | Freq: Two times a day (BID) | ORAL | 12 refills | Status: DC

## 2019-11-05 ENCOUNTER — Other Ambulatory Visit (INDEPENDENT_AMBULATORY_CARE_PROVIDER_SITE_OTHER): Payer: Self-pay | Admitting: Pediatrics

## 2019-11-05 DIAGNOSIS — G40209 Localization-related (focal) (partial) symptomatic epilepsy and epileptic syndromes with complex partial seizures, not intractable, without status epilepticus: Secondary | ICD-10-CM

## 2019-11-05 NOTE — Telephone Encounter (Signed)
Please send to the pharmacy °

## 2019-11-13 ENCOUNTER — Other Ambulatory Visit: Payer: Self-pay

## 2019-11-13 ENCOUNTER — Ambulatory Visit (INDEPENDENT_AMBULATORY_CARE_PROVIDER_SITE_OTHER): Payer: Medicaid Other | Admitting: Pediatrics

## 2019-11-13 VITALS — BP 122/82 | Ht 64.0 in | Wt 187.0 lb

## 2019-11-13 DIAGNOSIS — Z01818 Encounter for other preprocedural examination: Secondary | ICD-10-CM

## 2019-11-13 MED ORDER — PREDNISONE 20 MG PO TABS: 20.0000 mg | ORAL_TABLET | Freq: Two times a day (BID) | ORAL | 0 refills | Status: DC

## 2019-11-15 ENCOUNTER — Encounter: Payer: Self-pay | Admitting: Pediatrics

## 2019-11-15 NOTE — Patient Instructions (Signed)
Dental Caries, Adult  Dental caries are spots of decay (cavities) in the outer layer of your tooth (enamel). The natural bacteria in your mouth produce acid when breaking down sugary foods and drinks. When you eat or drink a lot of sugary foods and liquids, a lot of acid is produced. The acid destroys the protective enamel of your tooth, leading to tooth decay. It is important to treat your tooth decay as soon as possible. Untreated dental caries can spread decay and lead to painful infection. Brushing regularly with fluoride toothpaste (oral hygiene) and getting regular dental checkups can help prevent dental caries. What are the causes? Dental caries are caused by the acid that is produced when bacteria break down sugary or acidic foods and drinks. What increases the risk? This condition is more likely to develop in young adults. This condition is also more likely to develop in people who:  Drink a lot of sugary liquids, including alcoholic drinks, such as champagne.  Eat a lot of sweets and carbohydrates.  Drink water that is not treated with fluoride.  Have poor oral hygiene.  Have deep grooves in their teeth.  Take certain medicines that decrease saliva. What are the signs or symptoms? Symptoms of dental caries include:  White, brown, or black spots on the teeth.  Pain.  Swollen or bleeding gums. How is this diagnosed? Your dentist may suspect dental caries from your signs and symptoms. The dentist will also do an oral exam. This may include X-rays to confirm the diagnosis. Sometimes lights, a thin probe, and dyes are used to find dental caries (using electrical conductivity or using laser reflection). How is this treated? Treatment for dental caries usually involves a procedure to remove the decay and restore the tooth with a filling or a sealant. Follow these instructions at home:  Practice good oral hygiene. This keeps your mouth and gums healthy. Use fluoride toothpaste to  brush your teeth twice a day, and floss once a day.  If your dentist prescribed an antibiotic medicine to treat an infection, take it as told by your dentist. Do not stop taking the antibiotic even if your condition improves.  Keep all follow-up visits as told by your dentist. This is important. This includes all cleanings. How is this prevented? To prevent dental caries:  Brush your teeth every morning and night with fluoride toothpaste.  Get regular dental cleanings.  If you are at risk of dental caries, wash your mouth with prescription mouthwash (chlorhexidine) and apply topical fluoride to your teeth.  Drink fluoridated water regularly.  Drink water instead of sugary drinks.  Eat healthy meals and snacks. Contact a health care provider if:  You have symptoms of tooth decay. This information is not intended to replace advice given to you by your health care provider. Make sure you discuss any questions you have with your health care provider. Document Revised: 05/03/2017 Document Reviewed: 12/02/2015 Elsevier Patient Education  2020 [employer].

## 2019-11-15 NOTE — Progress Notes (Signed)
Subjective:    Zachary [name] is a 29 y.o. male who presents to the office today for a preoperative consultation at the request of surgeon -DENTAL who plans on performing full mouth rehab  on June 25. This consultation is requested for the specific conditions prompting preoperative evaluation (i.e. because of potential affect on operative risk): Developmental delay/seizures/autism.  Planned anesthesia: general. The patient has the following known anesthesia issues: none. Patients bleeding risk: no recent abnormal bleeding. Patient does not have objections to receiving blood products if needed.  The following portions of the patient's history were reviewed and updated as appropriate: allergies, current medications, past family history, past medical history, past social history, past surgical history and problem list.  Review of Systems Pertinent items are noted in HPI.    Objective:    BP 122/82   Ht 5\' 4"  (1.626 m)   Wt 187 lb (84.8 kg)   BMI 32.10 kg/m  General appearance: cooperative and non verbal -global developmental delay Ears: normal TM's and external ear canals both ears Nose: Nares normal. Septum midline. Mucosa normal. No drainage or sinus tenderness. Throat: lips, mucosa, and tongue normal; teeth and gums normal Neck: no adenopathy and supple, symmetrical, trachea midline Lungs: clear to auscultation bilaterally Heart: regular rate and rhythm, S1, S2 normal, no murmur, click, rub or gallop Abdomen: soft, non-tender; bowel sounds normal; no masses,  no organomegaly Extremities: extremities normal, atraumatic, no cyanosis or edema Skin: Skin color, texture, turgor normal. No rashes or lesions Neurologic: Mental status: baseline for autism and global developmental delay  Predictors of intubation difficulty: NONE   Lab Review  None indicated   Assessment:      29 y.o. male with planned surgery as above.   Known risk factors for perioperative complications: None global  developmental delay/seizures and autism   Difficulty with intubation is not anticipated.    Plan:    Cleared with knowledge to control for hypertension and seizures Forms for pre op filled Follow as needed

## 2019-11-16 ENCOUNTER — Telehealth: Payer: Self-pay | Admitting: Pediatrics

## 2019-11-16 MED ORDER — CEFDINIR 300 MG PO CAPS: 300.0000 mg | ORAL_CAPSULE | Freq: Two times a day (BID) | ORAL | 0 refills | Status: AC

## 2019-11-16 NOTE — Telephone Encounter (Signed)
Mom called and Zachary [name] is still coughing and mom was wondering if she should take him for a chest xray and woulds like to talk to you please

## 2019-11-16 NOTE — Telephone Encounter (Signed)
Called in antibiotics for sinus infection 

## 2019-11-27 ENCOUNTER — Telehealth: Payer: Self-pay

## 2019-11-27 ENCOUNTER — Other Ambulatory Visit: Payer: Self-pay

## 2019-11-27 ENCOUNTER — Ambulatory Visit
Admission: RE | Admit: 2019-11-27 | Discharge: 2019-11-27 | Disposition: A | Discharge status: Home / Self Care | Payer: Medicaid Other | Source: Ambulatory Visit | Attending: Pediatrics | Admitting: Pediatrics

## 2019-11-27 DIAGNOSIS — R059 Cough, unspecified: Secondary | ICD-10-CM

## 2019-11-27 NOTE — Telephone Encounter (Signed)
Came in on 11/13/2019 to see Dr. Ardyth Man for appt. Mother was told to call back if coughing and symptoms do not lessen.   Called on 11/27/2019 @8 :30AM to let Dr. know that symptoms are still prevalent and asked for the referral to be made.

## 2019-11-28 ENCOUNTER — Other Ambulatory Visit: Payer: Self-pay | Admitting: Pediatrics

## 2019-11-28 MED ORDER — PREDNISONE 20 MG PO TABS: 20.0000 mg | ORAL_TABLET | Freq: Two times a day (BID) | ORAL | 0 refills | Status: DC

## 2019-12-01 ENCOUNTER — Other Ambulatory Visit: Payer: Self-pay | Admitting: Pediatrics

## 2019-12-01 MED ORDER — BUDESONIDE 0.5 MG/2ML IN SUSP: 0.5000 mg | Freq: Every day | RESPIRATORY_TRACT | 12 refills | Status: DC

## 2019-12-01 NOTE — Progress Notes (Signed)
pulmicort

## 2020-02-01 ENCOUNTER — Encounter (INDEPENDENT_AMBULATORY_CARE_PROVIDER_SITE_OTHER): Payer: Self-pay | Admitting: Pediatrics

## 2020-02-01 ENCOUNTER — Other Ambulatory Visit: Payer: Self-pay

## 2020-02-01 ENCOUNTER — Ambulatory Visit (INDEPENDENT_AMBULATORY_CARE_PROVIDER_SITE_OTHER): Payer: Medicaid Other | Admitting: Pediatrics

## 2020-02-01 VITALS — BP 112/70 | HR 88 | Wt 184.0 lb

## 2020-02-01 DIAGNOSIS — G4733 Obstructive sleep apnea (adult) (pediatric): Secondary | ICD-10-CM

## 2020-02-01 DIAGNOSIS — F840 Autistic disorder: Secondary | ICD-10-CM

## 2020-02-01 DIAGNOSIS — G40319 Generalized idiopathic epilepsy and epileptic syndromes, intractable, without status epilepticus: Secondary | ICD-10-CM

## 2020-02-01 DIAGNOSIS — G808 Other cerebral palsy: Secondary | ICD-10-CM | POA: Diagnosis not present

## 2020-02-01 DIAGNOSIS — G40209 Localization-related (focal) (partial) symptomatic epilepsy and epileptic syndromes with complex partial seizures, not intractable, without status epilepticus: Secondary | ICD-10-CM

## 2020-02-01 DIAGNOSIS — R451 Restlessness and agitation: Secondary | ICD-10-CM

## 2020-02-01 MED ORDER — KEPPRA 1000 MG PO TABS: ORAL_TABLET | ORAL | 5 refills | Status: DC

## 2020-02-01 MED ORDER — LORAZEPAM 1 MG PO TABS: ORAL_TABLET | ORAL | 5 refills | Status: DC

## 2020-02-01 MED ORDER — DIAZEPAM 20 MG RE GEL: RECTAL | 5 refills | Status: DC

## 2020-02-01 MED ORDER — OXTELLAR XR 600 MG PO TB24: 3.0000 | ORAL_TABLET | Freq: Every day | ORAL | 5 refills | Status: DC

## 2020-02-01 MED ORDER — VIMPAT 200 MG PO TABS: 400.0000 mg | ORAL_TABLET | Freq: Every day | ORAL | 5 refills | Status: DC

## 2020-02-01 MED ORDER — VIMPAT 100 MG PO TABS: ORAL_TABLET | ORAL | 5 refills | Status: DC

## 2020-02-01 NOTE — Patient Instructions (Signed)
Thank you for coming today we are not going to make any changes in [name]'s medication.  I refilled all 6 medicines that he takes.  We will see you again in 4 months.  I told you that my partner Dr. Lezlie Lye will be taking over his care within this next year while I am still here.  I hope to allow her to visit with you next time Zachary [name] is here she is with the patient today.  She has been trained in epilepsy fellowship and I am certain will be able to provide very good care for him.  Please let me know when [name] has seizures as you always do.  The vagal nerve stimulator is working well.

## 2020-02-01 NOTE — Progress Notes (Signed)
Patient: Zachary [name] MRN: [id number] Sex: male DOB: 05-20-1991  Provider: Ellison Carwin, MD Location of Care: [facility]  Note type: Routine return visit  History of Present Illness: Referral Source: [name], MD History from: mother, patient and [facility] chart Chief Complaint: VNS/Epilepsy  Zachary [name] is a 29 y.o. male who was evaluated February 01, 2020 for the first time since October 01, 2019.  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  All of his recent seizures have been generalized.  His mother recounts that there have been 5 seizures over the past 4 months.  The episodes last between 45 seconds at a minute.  His last event was yesterday.  Mother is not certain how long it lasted.  She has not needed to use rescue medication.  Bernarr developed pneumonia in July and has had a couple of chest x-rays.  When his symptoms persisted after the infection subsided he was given a Medrol dose pack.  He had some swelling in his legs and was sent to the renal physician who placed him on allergy medicine this also has not worked.  He has been vaccinated for Covid.  He did not contract Covid with this pneumonia.  He has some breathing treatments that he received with a nebulizer.  He is placed on CPAP at nighttime for sleep apnea but typically will pull it off as soon as he starts to cough.  He was seen at [facility] for dental procedure and had 4 caries repaired and 2 teeth pulled.  He grinds his teeth.  His dentist wanted to do treatment for periodontal disease but he clenches his jaws so tightly that it was very hard to work with him.  They are discussing whether or not a higher degree of anesthesia would allow him his jaw to be relaxed so exposure could be gained.  He receives lorazepam when he is agitated this is about once every other week.  I interrogated his vagal nerve stimulator and it is working well.  He  was recently reimplanted.  He is on a rapid duty cycle which is worked well for him.  Because of that the auto stim cannot be activated.  I am unwilling to change him from a stimulus pattern that has worked quite well for him.  The output current is 2.5 mA frequency 20 Hz pulse width 250 s signal on time is 7 seconds, signal off time 0.8 minutes.  Duty cycle is 20%.  The magnet is set at 2.75 mA, 250 s pulse width and and on time of 60 seconds.  Lead impedances 1576 ohms the battery is at 100%.  He averages less than 0.18 - stimuli per day.  He only gets stimulated when he has a seizure and his mother catches it.  Printed report was scanned into the chart.  In general his health has been good except for the cough.  Procedure: interrogation of vagal nerve stimulator  The implanted device is series 102, serial [id number], implanted May 23, 2011.  On interrogation:Normalamplitude 2.50 mA, frequency 20 Hz, pulse width 250 s, stimulation duration 7 seconds, stimulation interval 0.8 minutes. Magnetamplitude 2.75 mA, magnet stimulation duration 60 seconds, magnet pulse width 250 s.  Number of stimuli since lastinterrogationis22; total number since implantation:944.  Systemdiagnostics with an amplitude of 1.19milliamps again showed intact communication, output, and impedance. DC/ DC conversion code was 1. There is no indication to change the battery.  Review of  Systems: A complete review of systems was remarkable for patient is here to be seen for vns and epilepsy. Mom reports that the patient has experienced five seizures since his last visit. She reports that the seizures do not last more than a minute. She states that heronly concern for today is a cough that the patient has had since his vns battery was replaced. She has no other concerns at this time., all other systems reviewed and negative.  Past Medical History Diagnosis Date  . 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 Theressa Stamps. Patient's AHI was 39.6 RDI is 43.6 positional component. Patient will need desensitization and a gentle approach to CPAP titration.   . CP (cerebral palsy) (HCC)   . Fracture of femur, intertrochanteric, closed (HCC)   . Hypertension   . MR (mental retardation)   . Recurrent apnea   . Seizures (HCC)   . 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 Marland Kitchen    Hospitalizations: No., Head Injury: No., Nervous System Infections: No., Immunizations up to date: Yes.    Copied from prior chart notes I began to see him on July 10, 2004. He apparently was seen by me when he was eight months of age with developmental delay. Seizure activity; however, did not began until he is 14, two days after starting amitriptyline. 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 His most recent CT scan of the brain was on September 29, 2009, and showed no significant changes.  EEG showed diffuse background slowing.   He was treated with Depakote, Topamax, and Lyrica, but suffered side effects and agitation from those medications. He has taken Trileptal, Keppra, and Vimpat for quite some time.   He was seen at [facility] on Oct 27, 2010 for 6 days.for a phase 1 evaluation. MRI of the brain showed ex-vacuo dilatation of his ventricles and cortical atrophy. Volume loss most pronounced in the parietal lobes bilaterally, temporal lobes with widening of the sylvian fissures and a paucity of white matter diffusely most pronounced again in the parietal lobes with patchy T2 hyperintensity. 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 There was no evidence of mesial temporal  sclerosis.  Magnetoencephalogram showed epileptiform activity arising from the left posterior temple region with a field extending to the occipital lobe. This was not seen with concurrent routine EEG.  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 This was somewhat worse in the right than the left bilateral temporal cortex and temporal pole suggesting a longstanding insult.  Prolonged video EEG showed onset of bifrontal slowing that evolved into rhythmic activity with spikes of greater amplitude over the right central region with secondary generalization. Clinically, the patient has onset of a myoclonic jerk with his head turned to the left, movement of the left arm and leg, then tonic posturing followed by tonic-clonic generalized movements.  One day later he had bifrontal rhythmic beta range activity followed by decrement in voltage with fast beta range activity for about 30 seconds and then generalized rhythmic beta range activity. The patient had a slight head bob slowly lowered to  the bed with stiffening of both upper extremities and tonic-clonic movement of both upper extremities followed by secondary generalization in his legs. A total of seven seizures were monitored. The last three were obscured by electrode artifact. Seizure onset appeared to occur from both frontal regions. This led to the recommendations to place the vagal nerve stimulator.  .  He has frequent ear infections, urinary tract infections, constipation and a hip fracture. He has had problems with neutropenia and thrombocytopenia. He had some vomiting in June, 2010 and developed aspiration pneumonia. He tends to have a temperature of 99 in the late evening.   Hehad several episodes in which his left knee has been dislocated. This happened as recently as August 28, 2009. He sat down in a chair and his knee went  out of place. EMS was called and he was taken to the ER. While waiting for the ER physician, Jenesis stretched and his knee went back in to place. His mother said that he was subsequently seen by his orthopedist, who thinks that he may eventually need surgery on his knee.  Home sleep study December 20, 2013 through [facility] showed evidence of obstructive apnea with desaturations. This is discussed in detail in the February 11, 2014 note in his chart. He was prescribed CPAP and is able to wear it to some extent but does not have a high quality sleep. In addition the study showed that when the vagal nerve stimulator discharges, there are brief periods of apnea corresponding to the discharge.  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 FOOT SURGERY    . FRACTURE SURGERY    . HIP SURGERY    . IMPLANTATION VAGAL NERVE STIMULATOR    . Pediatomy  2009  . TONSILLECTOMY  2009  . TYMPANOSTOMY TUBE PLACEMENT  2009   Family History family history includes Cancer in his paternal grandmother; Diabetes in his paternal grandfather. Family history is negative for migraines, seizures, intellectual disabilities, blindness, deafness, birth defects, chromosomal disorder, or autism.  Social History Socioeconomic History  . Marital status: Single  . Years of education:  73  . Highest education level:  High school certificate  Occupational History  . Not employed  Tobacco Use  . Smoking status: Never Smoker  . Smokeless tobacco: Never Used  Substance and Sexual Activity  . Alcohol use: No  . Drug use: No  . 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     He enjoys swimming and going to the beach.   Allergies Allergen Reactions  . Depakote [Divalproex Sodium] Other (See  Comments)    Low Platelets  . Lyrica [Pregabalin] Other (See Comments)    Sleepiness  . Penicillins Hives and Rash    Has patient had a PCN reaction causing immediate rash, facial/tongue/throat swelling, SOB or lightheadedness with hypotension: Unknown Has patient had a PCN reaction causing severe rash involving mucus membranes or skin necrosis: Yes Has patient had a PCN reaction that required hospitalization: No Has patient had a PCN reaction occurring within the last 10 years: No If all of the above answers are "NO", then may proceed with Cephalosporin use.   . Topamax [Topiramate] Other (See Comments)    Weight Loss   Physical Exam BP 112/70   Pulse 88   Wt 184  lb (83.5 kg)   BMI 31.58 kg/m   General: alert, well developed, well nourished, in no acute distress, black hair, brown eyes, right handed Head: normocephalic, no dysmorphic features Ears, Nose and Throat: Otoscopic: tympanic membranes normal; pharynx: oropharynx is pink without exudates or tonsillar hypertrophy Neck: supple, full range of motion, no cranial or cervical bruits Respiratory: auscultation clear Cardiovascular: no murmurs, pulses are normal Musculoskeletal: no skeletal deformities or apparent scoliosis Skin: no rashes or neurocutaneous lesions  Neurologic Exam  Mental Status: alert; does not turn to look at me when his name is called, does not follow commands, does not speak. Cranial Nerves: visual fields are full to double simultaneous stimuli; extraocular movements are full and dysconjugate; he has left eye amblyopia the right eye fixes and follows, pupils are round reactive to light; funduscopic examination shows bilateral positive red reflex; symmetric, impassive facial strength; midline tongue; inconsistently localizes sound bilaterally Motor: quadriparesis with spasticity and increased tone, left greater than right, uses his right hand to push me away and to grasp objects Sensory: withdraws  x4 Coordination: unable to test, but he does not show tremor in the right hand or on the left Gait and Station: diplegic shuffling gait Reflexes: symmetric and diminished bilaterally; clonus, couple of beats bilaterally; bilateral neutral plantar responses  Assessment 1.  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 2.  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 3.  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 4.  Obstructive sleep apnea, G47.33.  Discussion [place] is doing well.  There is no reason for [place] to change his medications or his vagal nerve stimulator.  He has a new implanted one.  We cannot use the auto stimulation feature because of the rapid duty cycle.  I do not want to change the rapid cycle to include the other because things are going so well.  Plan Prescriptions were issued for Keppra, Oxtellar Exar, both Vimpat tablets, lorazepam, and diazepam gel.  He will return to see me in 4 months I will be happy to see him sooner based on clinical need.  In all likelihood his care will be subsumed by my partner Dr. Lezlie Lye.  I want an overlap to occur before I finished practice in 14 months.  Greater than 50% of a 25-minute visit was spent in counseling coordination of care concerning management of his seizure disorder, discussing his other medical issues, and interrogating his vagal nerve stimulator.   Medication List   Accurate as of February 01, 2020  8:31 AM. If you have any questions, ask your nurse or doctor.    acetaminophen 500 MG tablet Commonly known as: TYLENOL Take 500 mg by mouth every 6 (six) hours as needed for mild pain, moderate pain, fever or headache.   albuterol (2.5 MG/3ML) 0.083% nebulizer solution Commonly known as: PROVENTIL Take 3 mLs (2.5 mg total) by nebulization every 6 (six) hours as needed for wheezing or shortness of breath.   budesonide 0.5 MG/2ML  nebulizer solution Commonly known as: PULMICORT Take 2 mLs (0.5 mg total) by nebulization daily.   cetirizine 10 MG tablet Commonly known as: ZYRTEC Take 1 tablet (10 mg total) by mouth 2 (two) times daily.   chlorthalidone 25 MG tablet Commonly known as: HYGROTON Take 25 mg by mouth 2 (two) times daily.   cholecalciferol 1000 units tablet Commonly known as: VITAMIN D Take 1,000 Units by mouth daily.   Ciprodex OTIC suspension Generic drug: ciprofloxacin-dexamethasone PLACE 4  DROPS IN LEFT EAR TWICE DAILY FOR 7 DAYS   Diastat AcuDial 20 MG Gel Generic drug: diazepam Give 15 mg rectally after 2 minutes of seizure.   docusate sodium 100 MG capsule Commonly known as: COLACE Take 200 mg by mouth daily.   Keppra 1000 MG tablet Generic drug: levETIRAcetam TAKE ONE AND ONE-HALF TABLET TWICE DAILY   LORazepam 1 MG tablet Commonly known as: ATIVAN Take 1 tablet by mouth as needed for agitation.   Nayzilam 5 MG/0.1ML Soln Generic drug: Midazolam Place 5 mg into the nose once for 1 dose. If seizures persist may repeat in 5 minutes   Oxtellar XR 600 MG Tb24 Generic drug: OXcarbazepine ER TAKE 3 TABLETS AT BEDTIME   PEG 3350 17 GM/SCOOP Powd 17 GRAMS BY MOUTH DAILY AS NEEDED FOR MILD CONSTIPATION- CAN INCREASETO 4 CAPFULS DAILY IF NEEDED   potassium chloride 20 MEQ packet Commonly known as: KLOR-CON Take 20 mEq by mouth 2 (two) times daily.   predniSONE 20 MG tablet Commonly known as: DELTASONE Take 1 tablet (20 mg total) by mouth 2 (two) times daily.   predniSONE 20 MG tablet Commonly known as: DELTASONE Take 1 tablet (20 mg total) by mouth 2 (two) times daily.   promethazine 25 MG suppository Commonly known as: Phenergan Place 1 suppository (25 mg total) rectally every 6 (six) hours as needed for nausea or vomiting.   spironolactone 25 MG tablet Commonly known as: ALDACTONE Take 25 mg by mouth at bedtime.   tamsulosin 0.4 MG Caps capsule Commonly known as:  Flomax Take 1 capsule (0.4 mg total) by mouth daily after supper.   Vimpat 100 MG Tabs Generic drug: Lacosamide TAKE ONE TABLET IN THE MORNING AND TAKE ONE TABLET AT 3PM   Vimpat 200 MG Tabs tablet Generic drug: lacosamide TAKE 2 TABLETS AT BEDTIME    The medication list was reviewed and reconciled. All changes or newly prescribed medications were explained.  A complete medication list was provided to the patient/caregiver.  Deetta PerlaWilliam H Danton Palmateer MD

## 2020-03-01 ENCOUNTER — Other Ambulatory Visit: Payer: Self-pay | Admitting: Nephrology

## 2020-03-01 DIAGNOSIS — R600 Localized edema: Secondary | ICD-10-CM

## 2020-03-07 ENCOUNTER — Other Ambulatory Visit: Payer: Medicaid Other

## 2020-03-18 ENCOUNTER — Emergency Department (HOSPITAL_COMMUNITY)
Admission: EM | Admit: 2020-03-18 | Discharge: 2020-03-18 | Disposition: A | Discharge status: Home / Self Care | Payer: Medicaid Other | Attending: Emergency Medicine | Admitting: Emergency Medicine

## 2020-03-18 ENCOUNTER — Telehealth: Payer: Self-pay

## 2020-03-18 ENCOUNTER — Encounter (HOSPITAL_COMMUNITY): Payer: Self-pay

## 2020-03-18 ENCOUNTER — Other Ambulatory Visit: Payer: Self-pay

## 2020-03-18 DIAGNOSIS — Z79899 Other long term (current) drug therapy: Secondary | ICD-10-CM | POA: Diagnosis not present

## 2020-03-18 DIAGNOSIS — R112 Nausea with vomiting, unspecified: Secondary | ICD-10-CM | POA: Insufficient documentation

## 2020-03-18 DIAGNOSIS — I10 Essential (primary) hypertension: Secondary | ICD-10-CM | POA: Diagnosis not present

## 2020-03-18 LAB — COMPREHENSIVE METABOLIC PANEL
ALT: 24 U/L (ref 0–44)
AST: 24 U/L (ref 15–41)
Albumin: 4 g/dL (ref 3.5–5.0)
Alkaline Phosphatase: 45 U/L (ref 38–126)
Anion gap: 11 (ref 5–15)
BUN: 16 mg/dL (ref 6–20)
CO2: 25 mmol/L (ref 22–32)
Calcium: 8.9 mg/dL (ref 8.9–10.3)
Chloride: 102 mmol/L (ref 98–111)
Creatinine, Ser: 0.75 mg/dL (ref 0.61–1.24)
GFR, Estimated: >60 mL/min (ref >60)
Glucose, Bld: 115 mg/dL — ABNORMAL HIGH (ref 70–99)
Potassium: 3.8 mmol/L (ref 3.5–5.1)
Sodium: 138 mmol/L (ref 135–145)
Total Bilirubin: 0.5 mg/dL (ref 0.3–1.2)
Total Protein: 7 g/dL (ref 6.5–8.1)

## 2020-03-18 LAB — CBC WITH DIFFERENTIAL/PLATELET
Abs Immature Granulocytes: 0.04 10*3/uL (ref 0.00–0.07)
Basophils Absolute: 0 10*3/uL (ref 0.0–0.1)
Basophils Relative: 0 %
Eosinophils Absolute: 0 10*3/uL (ref 0.0–0.5)
Eosinophils Relative: 0 %
HCT: 49.2 % (ref 39.0–52.0)
Hemoglobin: 16.3 g/dL (ref 13.0–17.0)
Immature Granulocytes: 0 %
Lymphocytes Relative: 8 %
Lymphs Abs: 0.8 10*3/uL (ref 0.7–4.0)
MCH: 28.4 pg (ref 26.0–34.0)
MCHC: 33.1 g/dL (ref 30.0–36.0)
MCV: 85.7 fL (ref 80.0–100.0)
Monocytes Absolute: 0.7 10*3/uL (ref 0.1–1.0)
Monocytes Relative: 7 %
Neutro Abs: 8.1 10*3/uL — ABNORMAL HIGH (ref 1.7–7.7)
Neutrophils Relative %: 85 %
Platelets: 176 10*3/uL (ref 150–400)
RBC: 5.74 MIL/uL (ref 4.22–5.81)
RDW: 12.9 % (ref 11.5–15.5)
WBC: 9.6 10*3/uL (ref 4.0–10.5)
nRBC: 0 % (ref 0.0–0.2)

## 2020-03-18 LAB — URINALYSIS, ROUTINE W REFLEX MICROSCOPIC
Bilirubin Urine: NEGATIVE
Glucose, UA: NEGATIVE mg/dL
Hgb urine dipstick: NEGATIVE
Ketones, ur: 5 mg/dL — AB
Nitrite: NEGATIVE
Protein, ur: NEGATIVE mg/dL
Specific Gravity, Urine: 1.028 (ref 1.005–1.030)
pH: 7 (ref 5.0–8.0)

## 2020-03-18 LAB — LIPASE, BLOOD: Lipase: 23 U/L (ref 11–51)

## 2020-03-18 MED ORDER — SODIUM CHLORIDE 0.9 % IV BOLUS
1000.0000 mL | Freq: Once | INTRAVENOUS | Status: AC
  Administered 2020-03-18: 1000 mL via INTRAVENOUS

## 2020-03-18 MED ORDER — ONDANSETRON HCL 4 MG/2ML IJ SOLN
4.0000 mg | Freq: Once | INTRAMUSCULAR | Status: AC
  Administered 2020-03-18: 4 mg via INTRAVENOUS
  Filled 2020-03-18: qty 2

## 2020-03-18 MED ORDER — PROMETHAZINE HCL 25 MG RE SUPP: 25.0000 mg | Freq: Four times a day (QID) | RECTAL | 0 refills | Status: DC | PRN

## 2020-03-18 NOTE — Discharge Instructions (Signed)
Take Phenergan as needed for nausea.  Your labs are reassuring.  Your potassium level is normal.  Return if your symptoms worsen or if you have any other concern.

## 2020-03-18 NOTE — ED Provider Notes (Signed)
[facility]-EMERGENCY DEPT Provider Note   CSN: [id number] Arrival date & time: 03/18/20  1133     History Chief Complaint  Patient presents with  . Nausea  . Emesis    Zachary [name] is a 29 y.o. male.  The history is provided by a parent and medical records. No language interpreter was used.  Emesis    29 year old male significant history of cerebral palsy, mentally handicapped, hypertension, seizures, brought here via EMS from home for evaluation of nausea and vomiting.  History obtained through mother who is at bedside.  Per mom, this morning patient had multiple episodes of nausea vomiting and dry heaving.  He did not receive his morning's antiseizure medication which concerns mom.  She mention whenever he vomits, his potassium level usually drops.  Patient is nonverbal, his last bowel movement was 2 days ago but this is fairly normal for him as he normally requires enema and help with manual disimpaction by mom.  No report of any cough fever or trouble breathing.  He is up-to-date with Covid vaccination.  His last seizure was over a month ago.  Past Medical History:  Diagnosis Date  . 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 Theressa Stamps. Patient's AHI was 39.6 RDI is 43.6 positional component. Patient will need desensitization and a gentle approach to CPAP titration.   . CP (cerebral palsy) (HCC)   . Fracture of femur, intertrochanteric, closed (HCC)   . Hypertension   . MR (mental retardation)   . Recurrent apnea   . Seizures (HCC)   . 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 .     Patient Active Problem List   Diagnosis Date Noted  . Pre-op evaluation 10/06/2019  . Encounter for routine child health examination with abnormal findings 05/05/2019  . Acute urinary retention 04/28/2019  . Physical exam, annual  01/31/2015  . Obstructive sleep apnea 02/11/2014  . Autism spectrum disorder with accompanying intellectual impairment, requiring very substantial support (level 3) 12/30/2013  . 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 FOOT SURGERY    . FRACTURE SURGERY    . HIP SURGERY    . IMPLANTATION VAGAL NERVE STIMULATOR    . Pediatomy  2009  . TONSILLECTOMY  2009  . TYMPANOSTOMY TUBE PLACEMENT  2009       Family History  Problem Relation Age of Onset  . Cancer Paternal Grandmother        Died in her 53's  . Diabetes Paternal Grandfather        Died in his 22's     Social History   Tobacco Use  . Smoking status: Never Smoker  . Smokeless tobacco: Never Used  Substance Use Topics  . Alcohol use: No  . Drug use: No    Home Medications Prior to Admission medications   Medication Sig Start Date End Date Taking? Authorizing Provider  acetaminophen (TYLENOL) 500 MG tablet Take 500 mg by mouth every 6 (six) hours as needed for mild pain, moderate pain, fever or headache.    [provider]  albuterol (PROVENTIL) (2.5 MG/3ML) 0.083% nebulizer solution Take 3 mLs (2.5 mg total) by nebulization every 6 (six) hours as needed for wheezing or shortness of breath. 05/05/19   Ramgoolam,  Emeline Gins, MD  budesonide (PULMICORT) 0.5 MG/2ML nebulizer solution Take 2 mLs (0.5 mg total) by nebulization daily. 12/01/19   [name], MD  cetirizine (ZYRTEC) 10 MG tablet Take 1 tablet (10 mg total) by mouth 2 (two) times daily. 11/02/19 12/02/19  [name], MD  chlorthalidone (HYGROTON) 25 MG tablet Take 25 mg by mouth 2 (two) times daily.    [provider]  cholecalciferol (VITAMIN D) 1000 units tablet Take 1,000 Units by mouth daily.    [provider]  CIPRODEX OTIC suspension PLACE 4 DROPS IN LEFT EAR TWICE DAILY FOR 7 DAYS 08/12/19    [name], MD  diazepam (DIASTAT ACUDIAL) 20 MG GEL Give 15 mg rectally after 2 minutes of seizure. 02/01/20   Deetta Perla, MD  docusate sodium (COLACE) 100 MG capsule Take 200 mg by mouth daily.    [provider]  KEPPRA 1000 MG tablet Take 1-1/2 tablets twice daily 02/01/20   Deetta Perla, MD  LORazepam (ATIVAN) 1 MG tablet Take 1 tablet by mouth as needed for agitation. 02/01/20   Deetta Perla, MD  NAYZILAM 5 MG/0.1ML SOLN Place 5 mg into the nose once for 1 dose. If seizures persist may repeat in 5 minutes 03/30/19 03/30/19  Deetta Perla, MD  OXTELLAR XR 600 MG TB24 Take 3 tablets by mouth at bedtime. 02/01/20   Deetta Perla, MD  Polyethylene Glycol 3350 (PEG 3350) POWD 17 GRAMS BY MOUTH DAILY AS NEEDED FOR MILD CONSTIPATION- CAN INCREASETO 4 CAPFULS DAILY IF NEEDED 03/29/18   [name], MD  potassium chloride (KLOR-CON) 20 MEQ packet Take 20 mEq by mouth 2 (two) times daily.    [provider]  predniSONE (DELTASONE) 20 MG tablet Take 1 tablet (20 mg total) by mouth 2 (two) times daily. 11/13/19   [name], MD  predniSONE (DELTASONE) 20 MG tablet Take 1 tablet (20 mg total) by mouth 2 (two) times daily. 11/28/19   [name], MD  promethazine (PHENERGAN) 25 MG suppository Place 1 suppository (25 mg total) rectally every 6 (six) hours as needed for nausea or vomiting. 10/22/16 07/31/17  [name], MD  spironolactone (ALDACTONE) 25 MG tablet Take 25 mg by mouth at bedtime.    [provider]  tamsulosin (FLOMAX) 0.4 MG CAPS capsule Take 1 capsule (0.4 mg total) by mouth daily after supper. 04/28/19   Elvina Sidle, MD  VIMPAT 100 MG TABS TAKE ONE TABLET IN THE MORNING AND TAKE ONE TABLET AT 3PM 02/01/20   Deetta Perla, MD  VIMPAT 200 MG TABS tablet Take 2 tablets (400 mg total) by mouth at bedtime. 02/01/20   Deetta Perla, MD    Allergies    Depakote [divalproex sodium], Lyrica  [pregabalin], Penicillins, and Topamax [topiramate]  Review of Systems   Review of Systems  Unable to perform ROS: Patient nonverbal  Gastrointestinal: Positive for vomiting.    Physical Exam Updated Vital Signs BP 130/83 (BP Location: Right Arm)   Pulse 80   Temp 98 F (36.7 C) (Oral)   Resp 18   Ht 5\' 4"  (1.626 m)   Wt 83 kg   SpO2 100%   BMI 31.41 kg/m   Physical Exam Vitals and nursing note reviewed.  Constitutional:      General: He is not in acute distress.    Appearance: He is well-developed.  HENT:     Head: Atraumatic.  Eyes:     Conjunctiva/sclera: Conjunctivae normal.  Cardiovascular:  Rate and Rhythm: Normal rate and regular rhythm.     Pulses: Normal pulses.     Heart sounds: Normal heart sounds.  Pulmonary:     Effort: Pulmonary effort is normal.     Breath sounds: Normal breath sounds. No wheezing, rhonchi or rales.  Abdominal:     Palpations: Abdomen is soft.     Tenderness: There is no abdominal tenderness.  Musculoskeletal:     Cervical back: Neck supple.  Skin:    Findings: No rash.  Neurological:     Mental Status: He is alert. Mental status is at baseline.     ED Results / Procedures / Treatments   Labs (all labs ordered are listed, but only abnormal results are displayed) Labs Reviewed  CBC WITH DIFFERENTIAL/PLATELET - Abnormal; Notable for the following components:      Result Value   Neutro Abs 8.1 (*)    All other components within normal limits  COMPREHENSIVE METABOLIC PANEL - Abnormal; Notable for the following components:   Glucose, Bld 115 (*)    All other components within normal limits  URINALYSIS, ROUTINE W REFLEX MICROSCOPIC - Abnormal; Notable for the following components:   Ketones, ur 5 (*)    Leukocytes,Ua TRACE (*)    Bacteria, UA RARE (*)    All other components within normal limits  LIPASE, BLOOD    EKG None  Radiology No results found.  Procedures Procedures (including critical care  time)  Medications Ordered in ED Medications  ondansetron (ZOFRAN) injection 4 mg (4 mg Intravenous Given 03/18/20 1246)  sodium chloride 0.9 % bolus 1,000 mL (1,000 mLs Intravenous New Bag/Given 03/18/20 1244)    ED Course  I have reviewed the triage vital signs and the nursing notes.  Pertinent labs & imaging results that were available during my care of the patient were reviewed by me and considered in my medical decision making (see chart for details).    MDM Rules/Calculators/A&P                          BP 135/85   Pulse 69   Temp 98.4 F (36.9 C)   Resp 18   Ht 5\' 4"  (1.626 m)   Wt 83 kg   SpO2 100%   BMI 31.41 kg/m   Final Clinical Impression(s) / ED Diagnoses Final diagnoses:  Non-intractable vomiting with nausea, unspecified vomiting type    Rx / DC Orders ED Discharge Orders         Ordered    promethazine (PHENERGAN) 25 MG suppository  Every 6 hours PRN        03/18/20 1354         12:20 PM Patient with history of cerebral palsy here for evaluation of nausea and vomiting that started this morning.  At this time he is not actively vomiting appears to be in no acute discomfort has a soft nontender abdomen on exam.  Will check basic labs, provide symptomatic treatment, IV fluids.  Bowel sounds present on exam.  1:58 PM Labs are reassuring, normal potassium level, patient felt better after receiving IV fluid.  Mom requested for Phenergan as needed for nausea, will prescribe.  Patient stable for discharge.  I have low suspicion for SBO or other acute abdominal pathology.  Doubt COVID-19.   03/20/20, PA-C 03/18/20 1358    03/20/20, MD 03/24/20 1144

## 2020-03-18 NOTE — ED Triage Notes (Signed)
N/v x3 hours. Family states last time this occurred he was hypokalemic.

## 2020-03-18 NOTE — Telephone Encounter (Signed)
Called to ask for a refill for the promethazine suppository. Zachary [name] has been unable to keep anything down and hasn't been able to take his medicine. Asked for a call back.

## 2020-03-21 NOTE — Telephone Encounter (Signed)
Seen in ER and medications refilled

## 2020-03-30 DIAGNOSIS — M26623 Arthralgia of bilateral temporomandibular joint: Secondary | ICD-10-CM | POA: Insufficient documentation

## 2020-04-07 ENCOUNTER — Ambulatory Visit: Payer: Medicaid Other

## 2020-05-18 ENCOUNTER — Other Ambulatory Visit: Payer: Self-pay

## 2020-05-18 ENCOUNTER — Ambulatory Visit (INDEPENDENT_AMBULATORY_CARE_PROVIDER_SITE_OTHER): Payer: Medicaid Other | Admitting: Pediatrics

## 2020-05-18 VITALS — BP 120/80 | Ht 63.0 in | Wt 187.0 lb

## 2020-05-18 DIAGNOSIS — H029 Unspecified disorder of eyelid: Secondary | ICD-10-CM

## 2020-05-18 DIAGNOSIS — Z0001 Encounter for general adult medical examination with abnormal findings: Secondary | ICD-10-CM | POA: Diagnosis not present

## 2020-05-18 DIAGNOSIS — M26609 Unspecified temporomandibular joint disorder, unspecified side: Secondary | ICD-10-CM

## 2020-05-18 DIAGNOSIS — G808 Other cerebral palsy: Secondary | ICD-10-CM | POA: Diagnosis not present

## 2020-05-18 DIAGNOSIS — Z23 Encounter for immunization: Secondary | ICD-10-CM | POA: Diagnosis not present

## 2020-05-18 DIAGNOSIS — G40319 Generalized idiopathic epilepsy and epileptic syndromes, intractable, without status epilepticus: Secondary | ICD-10-CM | POA: Diagnosis not present

## 2020-05-18 DIAGNOSIS — F840 Autistic disorder: Secondary | ICD-10-CM

## 2020-05-18 DIAGNOSIS — Z0000 Encounter for general adult medical examination without abnormal findings: Secondary | ICD-10-CM

## 2020-05-18 DIAGNOSIS — G4730 Sleep apnea, unspecified: Secondary | ICD-10-CM

## 2020-05-18 MED ORDER — BUDESONIDE 0.5 MG/2ML IN SUSP: 0.5000 mg | Freq: Every day | RESPIRATORY_TRACT | 12 refills | Status: DC

## 2020-05-18 MED ORDER — FLUOCINOLONE ACETONIDE BODY 0.01 % EX OIL: 1.0000 "application " | TOPICAL_OIL | Freq: Every day | CUTANEOUS | 12 refills | Status: DC

## 2020-05-18 MED ORDER — CIPROFLOXACIN-DEXAMETHASONE 0.3-0.1 % OT SUSP: 4.0000 [drp] | Freq: Two times a day (BID) | OTIC | 12 refills | Status: DC

## 2020-05-23 ENCOUNTER — Encounter: Payer: Self-pay | Admitting: Pediatrics

## 2020-05-23 DIAGNOSIS — M26609 Unspecified temporomandibular joint disorder, unspecified side: Secondary | ICD-10-CM | POA: Insufficient documentation

## 2020-05-23 NOTE — Patient Instructions (Signed)
Epilepsy Epilepsy is when a person keeps having seizures. A seizure is a burst of abnormal activity in the brain. A seizure can change how you think or behave, and it can make it hard to be aware of what is happening. This condition can cause problems such as:  Falls, accidents, and injury.  Sadness (depression).  Poor memory.  Sudden unexplained death in epilepsy (SUDEP). This is rare. Its cause is not known. Most people with epilepsy lead normal lives. What are the causes? This condition may be caused by:  A head injury.  An injury that happens at birth.  A high fever during childhood.  A stroke.  Bleeding that goes into or around the brain.  Certain medicines and drugs.  Having too little oxygen for a long period of time.  Abnormal brain development.  Certain infections.  Brain tumors.  Conditions that are passed from parent to child (are hereditary). What are the signs or symptoms? Symptoms of a seizure vary from person to person. They may include:  Jerky movements of muscles (convulsions).  Stiffening of the body.  Movements of the arms or legs that you are not able to control.  Passing out (loss of consciousness).  Breathing problems.  Sudden falls.  Confusion.  Head nodding.  Eye blinking or twitching.  Lip smacking.  Drooling.  Fast eye movements.  Grunting.  Not being able to control when you pee or poop.  Staring.  Being hard to wake up (unresponsiveness). Some people have symptoms right before a seizure happens (aura) and right after a seizure happens. These symptoms include:  Fear or anxiety.  Feeling sick to your stomach (nauseous).  Feeling like the room is spinning (vertigo).  A feeling of having seen or heard something before (dj vu).  Odd tastes or smells.  Changes in how you see (vision), such as seeing flashing lights or spots. Symptoms that follow a seizure include:  Being confused.  Being sleepy.  Having a  headache. How is this treated? Treatment can control seizures. Treatment for this condition may involve:  Taking medicines to control seizures.  Having a device (vagus nerve stimulator) put in the chest. The device sends signals to a nerve and to the brain to prevent seizures.  Brain surgery to stop seizures from happening or to reduce how often they happen.  Having blood tests often to make sure you are getting the right amount of medicine. Once this condition has been diagnosed, it is important to start treatment as soon as possible. For some people, epilepsy goes away in time. Others will need treatment for the rest of their life. Follow these instructions at home: Medicines  Take over-the-counter and prescription medicines only as told by your doctor.  Avoid anything that may keep your medicine from working, such as alcohol. Activity  Get enough rest. Lack of sleep can make seizures more likely to occur.  Follow your doctor's advice about driving, swimming, and doing anything else that would be dangerous if you had a seizure. ? If you live in the [place] check with your local DMV (department of motor vehicles) to find out about local driving laws. Each state has rules about when you can return to driving. Teaching others Teach friends and family what to do if you have a seizure. They should:  Lay you on the ground to prevent a fall.  Cushion your head and body.  Loosen any tight clothing around your neck.  Turn you on your side.  Stay with   you until you are better.  Not hold you down.  Not put anything in your mouth.  Know whether or not you need emergency care.  General instructions  Avoid anything that causes you to have seizures.  Keep a seizure diary. Write down what you remember about each seizure. Be sure to include what might have caused it.  Keep all follow-up visits as told by your doctor. This is important. Contact a doctor if:  You have a change in how  often or when you have seizures.  You get an infection or start to feel sick. You may have more seizures when you are sick. Get help right away if:  A seizure does not stop after 5 minutes.  You have more than one seizure in a row, and you do not have enough time between the seizures to feel better.  A seizure makes it harder to breathe.  A seizure is different from other seizures you have had.  A seizure makes you unable to speak or use a part of your body.  You did not wake up right after a seizure. These symptoms may be an emergency. Do not wait to see if the symptoms will go away. Get medical help right away. Call your local emergency services (911 in the [place] Do not drive yourself to the hospital. Summary  Epilepsy is when a person keeps having seizures. A seizure is a burst of abnormal activity in the brain.  Treatment can control seizures.  Teach friends and family what to do if you have a seizure. This information is not intended to replace advice given to you by your health care provider. Make sure you discuss any questions you have with your health care provider. Document Revised: 01/13/2018 Document Reviewed: 01/13/2018 Elsevier Patient Education  2020 [employer]  

## 2020-05-23 NOTE — Progress Notes (Signed)
History of Present Illness Main concerns today are: Needs a chill out chair Needs referral to ophthalmologist for left eyelid skin tag and has not had a vision screen in some time Needs NEW wheelchair--electric preferred Needs New bed. PT evaluation for bed and chair. 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 Needs refills on antibiotic ear drops Needs prescription for oxygen --[facility]. Urinary retention--need for catheters Risk of COVID-19--need for masks Gaining weight and mom has trouble moving the wheelchair---needs a ELECTRIC wheelchair   Developmental History Developmental delay  Function: Mobility: manual wheelchair Pain concerns: no Hand function: Right: reduced dexterity Left: reduced dexterity Spine curvature: mild Swallowing: normal and modified diet (pureed) Toileting: dependent   Review of Systems: Vision: needs testing Hearing: followed by ENT Seizures: yes - controlled Constipation: no Fractures: no  The following portions of the patient's history were reviewed and updated as appropriate: allergies, current medications, past family history, past medical history, past social history, past surgical history and problem list.  Equipment:  Clinical cytogeneticist Pulse ox Oxygen tubes and tank Diapers and pads Wipes and gloves Sleep apnea machine  DIET--Ensure 1 per day   Specialists- Neuro-DR [employer]  ENT --[place] Ortho-Dr [place] Shi Pulmonary-N/A Cardio--N/A Endocrinology--N/A Dental--Needs one Ophthal--Dr [place] Urology--[place] Surgeon--N/A   Objective:    Physical Exam  Cognition: non-interactive HEENT---normal Eyes---left lower eyelid cyst Respiratory: normal, no increased effort CVS--No murmurs --good heart sounds Lower extremity function: decreased Abdomen: normal--no G tube present Spine scoliosis: mild  Sitting Ability:  assisted Gait: wheelchair     Assessment:   Annual Check up  Seizures Autism spectrum Quadriparesis Wheelchair bound  Left lower eyelid cyst  Plan:     Gross motor: delayed  Fine motor/ADL: delayed  Orthopedics/bracing: bilateral AFO's --present today 7. Other equipment:  bath chair, gait trainer, hand/wrist splint(s), life system, stander, walker, wheelchair and RAMP for house.  NEEDS Chill out chair Referral to ophthalmologist for left eyelid skin tag and has not had a vision screen in some time  NEW wheelchair--electric preferred Needs New bed. PT evaluation for bed and chair. 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 Needs refills on antibiotic ear drops Needs prescription for oxygen --[facility]. Urinary retention--need for catheters Risk of COVID-19--need for masks Gaining weight and mom has trouble moving the wheelchair---needs a ELECTRIC one

## 2020-05-27 ENCOUNTER — Telehealth: Payer: Self-pay | Admitting: Pediatrics

## 2020-05-27 MED ORDER — AMOXICILLIN-POT CLAVULANATE 500-125 MG PO TABS: 1.0000 | ORAL_TABLET | Freq: Two times a day (BID) | ORAL | 0 refills | Status: AC

## 2020-05-27 NOTE — Telephone Encounter (Signed)
Called in augmentin for possible pneumonia

## 2020-06-06 ENCOUNTER — Ambulatory Visit (INDEPENDENT_AMBULATORY_CARE_PROVIDER_SITE_OTHER): Payer: Medicaid Other | Admitting: Pediatrics

## 2020-06-17 ENCOUNTER — Ambulatory Visit (INDEPENDENT_AMBULATORY_CARE_PROVIDER_SITE_OTHER): Payer: Medicaid Other | Admitting: Pediatrics

## 2020-06-17 ENCOUNTER — Encounter (INDEPENDENT_AMBULATORY_CARE_PROVIDER_SITE_OTHER): Payer: Self-pay | Admitting: Pediatrics

## 2020-06-17 ENCOUNTER — Other Ambulatory Visit: Payer: Self-pay

## 2020-06-17 VITALS — BP 100/70 | HR 80 | Wt 184.0 lb

## 2020-06-17 DIAGNOSIS — G40209 Localization-related (focal) (partial) symptomatic epilepsy and epileptic syndromes with complex partial seizures, not intractable, without status epilepticus: Secondary | ICD-10-CM

## 2020-06-17 DIAGNOSIS — Z9689 Presence of other specified functional implants: Secondary | ICD-10-CM

## 2020-06-17 DIAGNOSIS — G808 Other cerebral palsy: Secondary | ICD-10-CM

## 2020-06-17 DIAGNOSIS — G40319 Generalized idiopathic epilepsy and epileptic syndromes, intractable, without status epilepticus: Secondary | ICD-10-CM

## 2020-06-17 DIAGNOSIS — F840 Autistic disorder: Secondary | ICD-10-CM

## 2020-06-17 DIAGNOSIS — R451 Restlessness and agitation: Secondary | ICD-10-CM

## 2020-06-17 MED ORDER — LORAZEPAM 1 MG PO TABS: ORAL_TABLET | ORAL | 5 refills | Status: DC

## 2020-06-17 MED ORDER — VIMPAT 100 MG PO TABS: ORAL_TABLET | ORAL | 5 refills | Status: DC

## 2020-06-17 MED ORDER — OXTELLAR XR 600 MG PO TB24: 3.0000 | ORAL_TABLET | Freq: Every day | ORAL | 5 refills | Status: DC

## 2020-06-17 MED ORDER — KEPPRA 1000 MG PO TABS: ORAL_TABLET | ORAL | 5 refills | Status: DC

## 2020-06-17 MED ORDER — VIMPAT 200 MG PO TABS: 400.0000 mg | ORAL_TABLET | Freq: Every day | ORAL | 5 refills | Status: DC

## 2020-06-17 NOTE — Patient Instructions (Addendum)
It was a pleasure to see you today.  His seizures seem to be about the same as they have been.  There is no reason to make changes in his medications.  I refilled prescriptions because they would have come daily in February.  You can hold onto the prescriptions until it is time.  We will plan to see Zachary [name] in 6 months.  We will have a joint visit with Dr Lezlie Lye so that you can meet her and she can get to know Zachary [name] and to interrogate his VNS.

## 2020-06-17 NOTE — Progress Notes (Signed)
Patient: Zachary [name] MRN: [id number] Sex: male DOB: 02/03/1991  Provider: Ellison CarwinWilliam Devaughn Savant, MD [facility]  Note type: Routine return visit  History of Present Illness: Referral Source: Zachary HahnAndres Ramgoolam, MD History from: mother, patient and [facility] Chief Complaint: VNS/Epilepsy  Zachary [name] is a 30 y.o. male who was evaluated June 18, 2019 for the first time since February 01, 2020.  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  There have been 2 seizures in November and 3 in December 2 in the same day.  He had an upper respiratory infection at that time and was on over-the-counter medication.  This occurred around Christmastime.  He had an ED evaluation for non-intractable vomiting March 18, 2020.  He had a COVID contact May 03, 2020.  He received both COVID vaccines and the booster.  He also received his flu shot.  He had an ED evaluation for vomiting October 15 he had a COVID contact November 30.  Has been no problems with his vagal nerve stimulator.  It was interrogated today and the results are as follows:  Procedure: interrogation of vagal nerve stimulator  The implanted device is Aspire SR [id number], serial # [id number], implanted Oct 28, 2019.  On interrogation:Normalamplitude 2.50 mA, frequency 20 Hz, pulse width 250 s, stimulation duration 7 seconds, stimulation interval 0.8 minutes. Magnetamplitude 2.75 mA, magnet stimulation duration 60 seconds, magnet pulse width 250 s.  Number of stimuli since lastinterrogationis27 Systemdiagnostics with an amplitude of 1.680milliamps again showed intact communication, output, and impedance. There is noindication to change the battery.  Review of Systems: A complete review of systems was remarkable for patient is here to be seen for VNS and epilepsy. Mom reports that the patient had two seizures in [place]. She states that the  patient had three seizures in December. She states that two of the seizures were on the same day. She states that he has had no other seizures since December. She has no other concerns at this time., all other systems reviewed and negative.  Past Medical History Diagnosis Date  . 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 Theressa StampsJames Crowe. Patient's AHI was 39.6 RDI is 43.6 positional component. Patient will need desensitization and a gentle approach to CPAP titration.   . CP (cerebral palsy) (HCC)   . Fracture of femur, intertrochanteric, closed (HCC)   . Hypertension   . MR (mental retardation)   . Recurrent apnea   . Seizures (HCC)   . 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 Marland Kitchen.    Hospitalizations: No., Head Injury: No., Nervous System Infections: No., Immunizations up to date: Yes.    Copied from prior chart notes I began to see him on July 10, 2004. He apparently was seen by me when he was eight months of age with developmental delay. Seizure activity; however, did not began until he is 14, two days after starting amitriptyline. 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 His most recent CT scan of the brain was on September 29, 2009, and showed no significant changes.  EEG showed diffuse background slowing.   He was treated with Depakote, Topamax, and Lyrica, but suffered side effects  and agitation from those medications. He has taken Trileptal, Keppra, and Vimpat for quite some time.   He was seen at [facility] on Oct 27, 2010 for 6 days.for a phase 1 evaluation. MRI of the brain showed ex-vacuo dilatation of his ventricles and cortical atrophy. Volume loss  most pronounced in the parietal lobes bilaterally, temporal lobes with widening of the sylvian fissures and a paucity of white matter diffusely most pronounced again in the parietal lobes with patchy T2 hyperintensity. 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 There was no evidence of mesial temporal sclerosis.  Magnetoencephalogram showed epileptiform activity arising from the left posterior temple region with a field extending to the occipital lobe. This was not seen with concurrent routine EEG.  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 This was somewhat worse in the right than the left bilateral temporal cortex and temporal pole suggesting a longstanding insult.  Prolonged video EEG showed onset of bifrontal slowing that evolved into rhythmic activity with spikes of greater amplitude over the right central region with secondary generalization. Clinically, the patient has onset of a myoclonic jerk with his head turned to the left, movement of the left arm and leg, then tonic posturing followed by tonic-clonic generalized movements.  One day later he had bifrontal rhythmic beta range activity followed by decrement in voltage with fast beta range activity for about 30 seconds and then generalized rhythmic beta range activity. The patient had a slight head bob slowly lowered to the bed with stiffening of both upper extremities and tonic-clonic movement of both upper extremities followed by secondary generalization in his legs. A total of seven seizures were monitored. The last three were obscured by electrode artifact. Seizure onset appeared to occur from both frontal regions. This led to the recommendations to place the vagal nerve stimulator.  .  He has frequent ear infections, urinary tract infections, constipation and a hip fracture. He has had problems with  neutropenia and thrombocytopenia. He had some vomiting in June, 2010 and developed aspiration pneumonia. He tends to have a temperature of 99 in the late evening.   Hehad several episodes in which his left knee has been dislocated. This happened as recently as August 28, 2009. He sat down in a chair and his knee went out of place. EMS was called and he was taken to the ER. While waiting for the ER physician, Zachary [name] stretched and his knee went back in to place. His mother said that he was subsequently seen by his orthopedist, who thinks that he may eventually need surgery on his knee.  Home sleep study December 20, 2013 through [facility] showed evidence of obstructive apnea with desaturations. This is discussed in detail in the February 11, 2014 note in his chart. He was prescribed CPAP and is able to wear it to some extent but does not have a high quality sleep. In addition the study showed that when the vagal nerve stimulator discharges, there are brief periods of apnea corresponding to the discharge.  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 FOOT SURGERY    . FRACTURE SURGERY    . HIP SURGERY    . IMPLANTATION VAGAL NERVE STIMULATOR    . Pediatomy  2009  . TONSILLECTOMY  2009  .  TYMPANOSTOMY TUBE PLACEMENT  2009   Family History family history includes Cancer in his paternal grandmother; Diabetes in his paternal grandfather. Family history is negative for migraines, seizures, intellectual disabilities, blindness, deafness, birth defects, chromosomal disorder, or autism.  Social History Socioeconomic History  . Marital status: Single  . Years of education:  14  . Highest education level:  High school certificate  Occupational History  . Not employed  Tobacco Use  . Smoking status: Never Smoker  . Smokeless tobacco: Never Used   Substance and Sexual Activity  . Alcohol use: No  . Drug use: No  . 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     He enjoys swimming and going to the beach.   Allergies Allergen Reactions  . Depakote [Divalproex Sodium] Other (See Comments)    Low Platelets  . Lyrica [Pregabalin] Other (See Comments)    Sleepiness  . Penicillins Hives and Rash    Has patient had a PCN reaction causing immediate rash, facial/tongue/throat swelling, SOB or lightheadedness with hypotension: Unknown Has patient had a PCN reaction causing severe rash involving mucus membranes or skin necrosis: Yes Has patient had a PCN reaction that required hospitalization: No Has patient had a PCN reaction occurring within the last 10 years: No If all of the above answers are "NO", then may proceed with Cephalosporin use.   . Topamax [Topiramate] Other (See Comments)    Weight Loss   Physical Exam BP 100/70   Pulse 80   Wt 184 lb (83.5 kg)   BMI 32.59 kg/m   General: alert, well developed, well nourished, in no acute distress, black hair, brown eyes, right handed Head: normocephalic, no dysmorphic features Ears, Nose and Throat: Otoscopic: tympanic membranes normal; pharynx: oropharynx is pink without exudates or tonsillar hypertrophy Neck: supple, full range of motion, no cranial or cervical bruits Respiratory: auscultation clear Cardiovascular: no murmurs, pulses are normal Musculoskeletal: no skeletal deformities or apparent scoliosis Skin: no rashes or neurocutaneous lesions  Neurologic Exam  Mental Status: alert; he does not look at me when his name is called; Zachary [name] he looks away when I try to engage him.  He did not follow commands today nor did he speak. Cranial Nerves: visual fields are full to double simultaneous stimuli; extraocular movements are full and dysconjugate; pupils are round reactive to light; funduscopic  examination shows sharp disc margins with normal vessels; symmetric facial strength; midline tongue; he puts his hands over his ears to a soft sound (jiggling of keys) bilaterally Motor: spastic quadriparesis with sparing of the right arm; clumsy fine motor movements; clawhand deformity on the left no pronator drift Sensory: withdrawal x4 Coordination: unable to test Gait and Station: Shuffling, diplegic ait and station: patient is able to walk on heels, toes and tandem without difficulty; balance is adequate; Romberg exam is negative; Gower response is negative Reflexes: symmetric and diminished bilaterally; couple beats of clonus bilaterally clonus; bilateral neutral plantar responses  Assessment 1.  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 2.  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 3.  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 4.  Obstructive sleep apnea, G47.33.  Discussion Bostyn is neurologically and physically stable.  His vagal nerve stimulator is working well.  Plan I made no changes in his current medication.  Prescriptions were refilled because they have will come due in  late February.  There is a 50% of the 30-minute visit was spent counseling coordination of care concerning his seizures and autism.  I will see him in 6 months.  Thereafter he will be followed in our clinic by one of my colleagues.  It probably will be Dr. Lezlie Lye.   Medication List   Accurate as of June 17, 2020  2:18 PM. If you have any questions, ask your nurse or doctor.    acetaminophen 500 MG tablet Commonly known as: TYLENOL Take 500 mg by mouth every 6 (six) hours as needed for mild pain, moderate pain, fever or headache.   albuterol (2.5 MG/3ML) 0.083% nebulizer solution Commonly known as: PROVENTIL Take 3 mLs (2.5 mg total) by nebulization every 6 (six) hours as needed for wheezing or  shortness of breath.   budesonide 0.5 MG/2ML nebulizer solution Commonly known as: PULMICORT Take 2 mLs (0.5 mg total) by nebulization daily.   cetirizine 10 MG tablet Commonly known as: ZYRTEC Take 1 tablet (10 mg total) by mouth 2 (two) times daily.   chlorthalidone 25 MG tablet Commonly known as: HYGROTON Take 25 mg by mouth 2 (two) times daily.   cholecalciferol 1000 units tablet Commonly known as: VITAMIN D Take 1,000 Units by mouth daily.   ciprofloxacin-dexamethasone OTIC suspension Commonly known as: CIPRODEX Place 4 drops into both ears 2 (two) times daily.   diazepam 20 MG Gel Commonly known as: Diastat AcuDial Give 15 mg rectally after 2 minutes of seizure.   Fluocinolone Acetonide Body 0.01 % Oil Commonly known as: Derma-Smoothe/FS Body Apply 1 application topically daily.   Keppra 1000 MG tablet Generic drug: levETIRAcetam Take 1-1/2 tablets twice daily What changed:   how much to take  how to take this  when to take this   LORazepam 1 MG tablet Commonly known as: ATIVAN Take 1 tablet by mouth as needed for agitation.   Nayzilam 5 MG/0.1ML Soln Generic drug: Midazolam Place 5 mg into the nose once for 1 dose. If seizures persist may repeat in 5 minutes   Oxtellar XR 600 MG Tb24 Generic drug: OXcarbazepine ER Take 3 tablets by mouth at bedtime. What changed: how much to take   potassium chloride 20 MEQ packet Commonly known as: KLOR-CON Take 20 mEq by mouth 2 (two) times daily as needed (lethargic).   promethazine 25 MG suppository Commonly known as: Phenergan Place 1 suppository (25 mg total) rectally every 6 (six) hours as needed for up to 7 days for nausea or vomiting.   spironolactone 50 MG tablet Commonly known as: ALDACTONE Take 50 mg by mouth daily.   Vimpat 100 MG Tabs Generic drug: Lacosamide TAKE ONE TABLET IN THE MORNING AND TAKE ONE TABLET AT 3PM What changed:   how much to take  how to take this  when to take this    Vimpat 200 MG Tabs tablet Generic drug: lacosamide Take 2 tablets (400 mg total) by mouth at bedtime. What changed: Another medication with the same name was changed. Make sure you understand how and when to take each.    The medication list was reviewed and reconciled. All changes or newly prescribed medications were explained.  A complete medication list was provided to the patient/caregiver.  Deetta Perla MD

## 2020-06-27 ENCOUNTER — Encounter: Payer: Self-pay | Admitting: Pediatrics

## 2020-06-29 ENCOUNTER — Telehealth: Payer: Self-pay

## 2020-06-29 NOTE — Telephone Encounter (Signed)
Sent to [facility]

## 2020-06-29 NOTE — Telephone Encounter (Signed)
Faxed progress notes and demographics to [phone]

## 2020-06-29 NOTE — Telephone Encounter (Signed)
Mother called to ask [place] to refax letter to Lockie Pares @ [facility] for wheelchair and PT.He will come to their house [phone] fax #

## 2020-08-06 ENCOUNTER — Other Ambulatory Visit (INDEPENDENT_AMBULATORY_CARE_PROVIDER_SITE_OTHER): Payer: Self-pay | Admitting: Pediatrics

## 2020-08-06 DIAGNOSIS — G40209 Localization-related (focal) (partial) symptomatic epilepsy and epileptic syndromes with complex partial seizures, not intractable, without status epilepticus: Secondary | ICD-10-CM

## 2020-08-08 NOTE — Telephone Encounter (Signed)
Please send to the pharmacy °

## 2020-09-05 ENCOUNTER — Other Ambulatory Visit: Payer: Self-pay | Admitting: Pediatrics

## 2020-09-05 ENCOUNTER — Telehealth: Payer: Self-pay | Admitting: Pediatrics

## 2020-09-05 MED ORDER — AMOXICILLIN-POT CLAVULANATE 500-125 MG PO TABS: 1.0000 | ORAL_TABLET | Freq: Two times a day (BID) | ORAL | 0 refills | Status: AC

## 2020-09-05 NOTE — Progress Notes (Signed)
Called in Augmentin for possible ear infection

## 2020-09-05 NOTE — Telephone Encounter (Signed)
Smacking ears and dried blood from right side--will start Augmentin for possible ear infection.

## 2020-10-09 ENCOUNTER — Encounter (INDEPENDENT_AMBULATORY_CARE_PROVIDER_SITE_OTHER): Payer: Self-pay

## 2020-10-11 ENCOUNTER — Encounter: Payer: Self-pay | Admitting: Pulmonary Disease

## 2020-10-11 ENCOUNTER — Encounter (INDEPENDENT_AMBULATORY_CARE_PROVIDER_SITE_OTHER): Payer: Self-pay | Admitting: *Deleted

## 2020-10-11 ENCOUNTER — Other Ambulatory Visit: Payer: Self-pay

## 2020-10-11 ENCOUNTER — Ambulatory Visit (INDEPENDENT_AMBULATORY_CARE_PROVIDER_SITE_OTHER): Payer: Medicaid Other | Admitting: Pulmonary Disease

## 2020-10-11 VITALS — BP 122/82 | HR 63 | Temp 97.3°F | Ht 63.0 in | Wt 183.4 lb

## 2020-10-11 DIAGNOSIS — G4733 Obstructive sleep apnea (adult) (pediatric): Secondary | ICD-10-CM

## 2020-10-11 DIAGNOSIS — R0683 Snoring: Secondary | ICD-10-CM | POA: Diagnosis not present

## 2020-10-11 NOTE — Progress Notes (Signed)
Zachary [name]    [id number]    Jun 27, 1990  Primary Care Physician:Ramgoolam, Emeline Gins, MD  Referring Physician: [name], MD [address] [place],  [place] [address]  Chief complaint:  Patient with a history of obstructive sleep apnea  HPI:  Accompanied by his mom Patient is nonverbal  He does have a CPAP at home Has not been able to tolerate CPAP recently  Last use CPAP about a year and a half ago Used CPAP for about 8 months  Difficulty keeping the mask on as it does not leave it on his face When he was trying it, his mom will have to always go back in to reapply the mask  She notices low oxygen saturations in the morning, saturations will run in the mid 80s Congestion in the mornings He does have oxygen at home  Has a nebulizer and albuterol goes in the nebulizer which they use about once a day  Has a lot of wheezing, rhonchi in the morning, congested Once he gets his nebulization treatments-his breathing will usually calm down  Outpatient Encounter Medications as of 10/11/2020  Medication Sig  . acetaminophen (TYLENOL) 500 MG tablet Take 500 mg by mouth every 6 (six) hours as needed for mild pain, moderate pain, fever or headache.  . albuterol (PROVENTIL) (2.5 MG/3ML) 0.083% nebulizer solution Take 3 mLs (2.5 mg total) by nebulization every 6 (six) hours as needed for wheezing or shortness of breath.  . budesonide (PULMICORT) 0.5 MG/2ML nebulizer solution Take 2 mLs (0.5 mg total) by nebulization daily.  . chlorthalidone (HYGROTON) 25 MG tablet Take 25 mg by mouth 2 (two) times daily.  . cholecalciferol (VITAMIN D) 1000 units tablet Take 1,000 Units by mouth daily.  . ciprofloxacin-dexamethasone (CIPRODEX) OTIC suspension Place 4 drops into both ears 2 (two) times daily.  . diazepam (DIASTAT ACUDIAL) 20 MG GEL Give 15 mg rectally after 2 minutes of seizure.  Marland Kitchen KEPPRA 1000 MG tablet Take 1-1/2 tablets twice daily  . LORazepam (ATIVAN) 1  MG tablet Take 1 tablet by mouth as needed for agitation.  . OXTELLAR XR 600 MG TB24 Take 3 tablets by mouth at bedtime.  . polyethylene glycol powder (GLYCOLAX/MIRALAX) 17 GM/SCOOP powder Take by mouth.  . potassium chloride (KLOR-CON) 20 MEQ packet Take 20 mEq by mouth 2 (two) times daily as needed (lethargic).   Marland Kitchen spironolactone (ALDACTONE) 50 MG tablet Take 50 mg by mouth daily.  Marland Kitchen VIMPAT 100 MG TABS TAKE ONE TABLET IN THE MORNING AND TAKE ONE TABLET AT 3PM  . VIMPAT 200 MG TABS tablet TAKE 2 TABLETS AT BEDTIME  . [DISCONTINUED] cetirizine (ZYRTEC) 10 MG tablet Take 1 tablet (10 mg total) by mouth 2 (two) times daily.  . [DISCONTINUED] NAYZILAM 5 MG/0.1ML SOLN Place 5 mg into the nose once for 1 dose. If seizures persist may repeat in 5 minutes  . [DISCONTINUED] promethazine (PHENERGAN) 25 MG suppository Place 1 suppository (25 mg total) rectally every 6 (six) hours as needed for up to 7 days for nausea or vomiting.   No facility-administered encounter medications on file as of 10/11/2020.    Allergies as of 10/11/2020 - Review Complete 10/11/2020  Allergen Reaction Noted  . Depakote [divalproex sodium] Other (See Comments) 08/26/2012  . Lyrica [pregabalin] Other (See Comments) 08/26/2012  . Penicillins Hives and Rash 06/25/2011  . Topamax [topiramate] Other (See Comments) 08/26/2012    Past Medical History:  Diagnosis Date  .  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 Theressa Stamps. Patient's AHI was 39.6 RDI is 43.6 positional component. Patient will need desensitization and a gentle approach to CPAP titration.   . CP (cerebral palsy) (HCC)   . Fracture of femur, intertrochanteric, closed (HCC)   . Hypertension   . MR (mental retardation)   . Recurrent apnea   . Seizures (HCC)   . 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 .     Past Surgical  History:  Procedure Laterality Date  . FOOT SURGERY    . FRACTURE SURGERY    . HIP SURGERY    . IMPLANTATION VAGAL NERVE STIMULATOR    . Pediatomy  2009  . TONSILLECTOMY  2009  . TYMPANOSTOMY TUBE PLACEMENT  2009    Family History  Problem Relation Age of Onset  . Cancer Paternal Grandmother        Died in her 75's  . Diabetes Paternal Grandfather        Died in his 3's     Social History   Socioeconomic History  . Marital status: Single    Spouse name: Not on file  . Number of children: Not on file  . Years of education: Not on file  . Highest education level: Not on file  Occupational History  . Not on file  Tobacco Use  . Smoking status: Never Smoker  . Smokeless tobacco: Never Used  Substance and Sexual Activity  . Alcohol use: No  . Drug use: No  . Sexual activity: Never  Other Topics Concern  . 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    He enjoys swimming and going to the beach.   Social Determinants of Health   Financial Resource Strain: Not on file  Food Insecurity: Not on file  Transportation Needs: Not on file  Physical Activity: Not on file  Stress: Not on file  Social Connections: Not on file  Intimate Partner Violence: Not on file    Review of Systems  Constitutional: Positive for fatigue.  Respiratory: Negative for shortness of breath.   Psychiatric/Behavioral: Positive for sleep disturbance.    Vitals:   10/11/20 1605  BP: 122/82  Pulse: 63  Temp: (!) 97.3 F (36.3 C)  SpO2: 99%     Physical Exam Constitutional:      Appearance: He is obese.  HENT:     Head: Normocephalic.     Nose: Nose normal.     Mouth/Throat:     Mouth: Mucous membranes are moist.  Cardiovascular:     Rate and Rhythm: Normal rate and regular rhythm.     Heart sounds: No murmur heard. No friction rub.  Pulmonary:     Effort: No respiratory distress.     Breath sounds: No stridor. No  wheezing or rhonchi.  Musculoskeletal:     Cervical back: No rigidity or tenderness.  Neurological:     Mental Status: He is alert.  Psychiatric:        Mood and Affect: Mood normal.     Data Reviewed: Last sleep study on record is on 12/29/2013 showing complex sleep apnea Study was limited by patient's agitation pulling her mask off and not leaving devices on his face  Assessment:  History of obstructive sleep apnea/complex sleep  apnea  Shortness of breath, wheezing, noisy respirations in the morning  Oxygen desaturations  Has a history of cerebral palsy, seizure disorder, quadriparesis Autism spectrum disorder  Plan/Recommendations:  We may be better served with oxygen supplementation if patient would at least keep oxygen supplementation in place.  He will not tolerate a mask over his face  An overnight oximetry will be ordered and oxygen supplementation at night may be appropriate  Use albuterol and Pulmicort via nebulizer first thing in the morning  Portable oxygen  concentrator will be ordered to facilitate him getting around with oxygen supplementation as needed  Tentative follow-up in 6 to 8 weeks  Virl Diamond MD [facility] 10/11/2020, 4:22 PM  CC: [name], MD

## 2020-10-11 NOTE — Patient Instructions (Signed)
History of sleep apnea Intolerant of facemask  Congestion, discomfort, desaturations in the morning  We will order an overnight oximetry  -He may do better with just oxygen supplementation at night  -Use a nebulizer treatment first thing in the morning with his Pulmicort and albuterol  -We will send in a prescription for portable oxygen concentrator  Follow-up in 6 to 8 weeks

## 2020-10-12 ENCOUNTER — Ambulatory Visit: Payer: Medicaid Other | Admitting: Podiatry

## 2020-10-12 ENCOUNTER — Ambulatory Visit (INDEPENDENT_AMBULATORY_CARE_PROVIDER_SITE_OTHER): Payer: Medicaid Other | Admitting: Pediatrics

## 2020-10-12 VITALS — BP 116/72 | HR 117 | Ht 63.0 in | Wt 187.0 lb

## 2020-10-12 DIAGNOSIS — Z01818 Encounter for other preprocedural examination: Secondary | ICD-10-CM

## 2020-10-12 LAB — POCT HEMOGLOBIN: Hemoglobin: 11.3 g/dL (ref 11–14.6)

## 2020-10-12 NOTE — Addendum Note (Signed)
Addended bySandra Cockayne on: 10/12/2020 08:32 AM   Modules accepted: Orders

## 2020-10-12 NOTE — Progress Notes (Signed)
Amoxil allergy  Taking Doxy for right big toe Surgery due within 30 days No meds refills Oxygen for home use --desk oxygen as oppsoed to TANK    Subjective:    Zachary [name] is a 30 y.o. male who presents to the office today for a preoperative consultation at the request of surgeon -DENTAL who plans on performing full mouth rehab  on June 25. This consultation is requested for the specific conditions prompting preoperative evaluation (i.e. because of potential affect on operative risk): Developmental delay/seizures/autism.  Planned anesthesia: general. The patient has the following known anesthesia issues: none. Patients bleeding risk: no recent abnormal bleeding. Patient does not have objections to receiving blood products if needed.  The following portions of the patient's history were reviewed and updated as appropriate: allergies, current medications, past family history, past medical history, past social history, past surgical history and problem list.  Review of Systems Pertinent items are noted in HPI.    Objective:    BP 116/72   Pulse (!) 117   Ht 5\' 3"  (1.6 m)   Wt 187 lb (84.8 kg)   SpO2 97%   BMI 33.13 kg/m  General appearance: cooperative and non verbal -global developmental delay Ears: normal TM's and external ear canals both ears Nose: Nares normal. Septum midline. Mucosa normal. No drainage or sinus tenderness. Throat: lips, mucosa, and tongue normal; teeth and gums normal Neck: no adenopathy and supple, symmetrical, trachea midline Lungs: clear to auscultation bilaterally Heart: regular rate and rhythm, S1, S2 normal, no murmur, click, rub or gallop Abdomen: soft, non-tender; bowel sounds normal; no masses,  no organomegaly Extremities: extremities normal, atraumatic, no cyanosis or edema Skin: Skin color, texture, turgor normal. No rashes or lesions Neurologic: Mental status: baseline for autism and global developmental delay  Predictors of intubation  difficulty: NONE   Lab Review  None indicated   Assessment:      30 y.o. male with planned surgery as above.   Known risk factors for perioperative complications: None global developmental delay/seizures and autism   Difficulty with intubation is not anticipated.    Plan:    Cleared with knowledge to control for hypertension and seizures Forms for pre op filled Follow as needed

## 2020-10-15 ENCOUNTER — Encounter: Payer: Self-pay | Admitting: Pediatrics

## 2020-10-15 DIAGNOSIS — Z01818 Encounter for other preprocedural examination: Secondary | ICD-10-CM | POA: Insufficient documentation

## 2020-10-15 NOTE — Patient Instructions (Signed)
General Anesthesia, Adult General anesthesia is the use of medicines to make a person "go to sleep" (unconscious) for a medical procedure. General anesthesia must be used for certain procedures, and is often recommended for procedures that:  Last a long time.  Require you to be still or in an unusual position.  Are major and can cause blood loss. The medicines used for general anesthesia are called general anesthetics. As well as making you unconscious for a certain amount of time, these medicines:  Prevent pain.  Control your blood pressure.  Relax your muscles. Tell a health care provider about:  Any allergies you have.  All medicines you are taking, including vitamins, herbs, eye drops, creams, and over-the-counter medicines.  Any problems you or family members have had with anesthetic medicines.  Types of anesthetics you have had in the past.  Any blood disorders you have.  Any surgeries you have had.  Any medical conditions you have.  Any recent upper respiratory, chest, or ear infections.  Any history of: ? Heart or lung conditions, such as heart failure, sleep apnea, asthma, or chronic obstructive pulmonary disease (COPD). ? Military service. ? Depression or anxiety.  Any tobacco or drug use, including marijuana or alcohol use.  Whether you are pregnant or may be pregnant. What are the risks? Generally, this is a safe procedure. However, problems may occur, including:  Allergic reaction.  Lung and heart problems.  Inhaling food or liquid from the stomach into the lungs (aspiration).  Nerve injury.  Dental injury.  Air in the bloodstream, which can lead to stroke.  Extreme agitation or confusion (delirium) when you wake up from the anesthetic.  Waking up during your procedure and being unable to move. This is rare. These problems are more likely to develop if you are having a major surgery or if you have an advanced or serious medical condition. You  can prevent some of these complications by answering all of your health care provider's questions thoroughly and by following all instructions before your procedure. General anesthesia can cause side effects, including:  Nausea or vomiting.  A sore throat from the breathing tube.  Hoarseness.  Wheezing or coughing.  Shaking chills.  Tiredness.  Body aches.  Anxiety.  Sleepiness or drowsiness.  Confusion or agitation. What happens before the procedure? Staying hydrated Follow instructions from your health care provider about hydration, which may include:  Up to 2 hours before the procedure - you may continue to drink clear liquids, such as water, clear fruit juice, black coffee, and plain tea.   Eating and drinking restrictions Follow instructions from your health care provider about eating and drinking, which may include:  8 hours before the procedure - stop eating heavy meals or foods such as meat, fried foods, or fatty foods.  6 hours before the procedure - stop eating light meals or foods, such as toast or cereal.  6 hours before the procedure - stop drinking milk or drinks that contain milk.  2 hours before the procedure - stop drinking clear liquids. Medicines Ask your health care provider about:  Changing or stopping your regular medicines. This is especially important if you are taking diabetes medicines or blood thinners.  Taking medicines such as aspirin and ibuprofen. These medicines can thin your blood. Do not take these medicines unless your health care provider tells you to take them.  Taking over-the-counter medicines, vitamins, herbs, and supplements. Do not take these during the week before your procedure unless your health   care provider approves them. General instructions  Starting 3-6 weeks before the procedure, do not use any products that contain nicotine or tobacco, such as cigarettes and e-cigarettes. If you need help quitting, ask your health care  provider.  If you brush your teeth on the morning of the procedure, make sure to spit out all of the toothpaste.  Tell your health care provider if you become ill or develop a cold, cough, or fever.  If instructed by your health care provider, bring your sleep apnea device with you on the day of your surgery (if applicable).  Ask your health care provider if you will be going home the same day, the following day, or after a longer hospital stay. ? Plan to have a responsible adult take you home from the hospital or clinic. ? Plan to have a responsible adult care for you for the time you are told after you leave the hospital or clinic. This is important. What happens during the procedure?  You will be given anesthetics through both of the following: ? A mask placed over your nose and mouth. ? An IV in one of your veins.  You may receive a medicine to help you relax (sedative).  After you are unconscious, a breathing tube may be inserted down your throat to help you breathe. This will be removed before you wake up.  An anesthesia specialist will stay with you throughout your procedure. He or she will: ? Keep you comfortable and safe by continuing to give you medicines and adjusting the amount of medicine that you get. ? Monitor your blood pressure, pulse, and oxygen levels to make sure that the anesthetics do not cause any problems. The procedure may vary among health care providers and hospitals.   What happens after the procedure?  Your blood pressure, temperature, heart rate, breathing rate, and blood oxygen level will be monitored until the medicines you were given have worn off.  You will wake up in a recovery area. You may wake up slowly.  If you feel anxious or agitated, you may be given medicine to help you calm down.  If you will be going home the same day, your health care provider may check to make sure you can walk, drink, and urinate.  Your health care provider will treat  any pain or side effects you have before you go home.  Do not drive or operate machinery until your health care provider says that it is safe. Summary  General anesthesia is used to keep you still and prevent pain during a procedure.  It is important to tell your health care provider about your medical history and any surgeries you have had, and previous experience with anesthesia.  Follow your health care provider's instructions about when to stop eating, drinking, or taking certain medicines before your procedure.  Plan to have a responsible adult take you home from the hospital or clinic. This information is not intended to replace advice given to you by your health care provider. Make sure you discuss any questions you have with your health care provider. Document Revised: 02/01/2020 Document Reviewed: 09/02/2019 Elsevier Patient Education  2021 [employer]  

## 2020-10-18 ENCOUNTER — Telehealth: Payer: Self-pay | Admitting: Pediatrics

## 2020-10-18 NOTE — Telephone Encounter (Signed)
Letter for sleep safe bed

## 2020-11-04 ENCOUNTER — Telehealth: Payer: Self-pay | Admitting: Pediatrics

## 2020-11-04 MED ORDER — OSELTAMIVIR PHOSPHATE 75 MG PO CAPS: 75.0000 mg | ORAL_CAPSULE | Freq: Two times a day (BID) | ORAL | 0 refills | Status: DC

## 2020-11-04 NOTE — Telephone Encounter (Signed)
Close contact with cousin with active flu.  Having onset symptoms and to start tamiflu.

## 2020-11-14 ENCOUNTER — Telehealth (INDEPENDENT_AMBULATORY_CARE_PROVIDER_SITE_OTHER): Payer: Self-pay | Admitting: Pediatrics

## 2020-11-14 ENCOUNTER — Encounter (INDEPENDENT_AMBULATORY_CARE_PROVIDER_SITE_OTHER): Payer: Self-pay

## 2020-11-14 DIAGNOSIS — G40319 Generalized idiopathic epilepsy and epileptic syndromes, intractable, without status epilepticus: Secondary | ICD-10-CM

## 2020-11-14 NOTE — Telephone Encounter (Signed)
I called guardian to get more information on seizures.  She reports that he can go 1-2 months without seizures and then will have a cluster.  She usually "waits it out" but he has a procedure the end of the month and wants to make sure he is primed for it.  I reviewed his medications, and based on his size, he could go up on Keppra (currently 35mg /kg/d).  Guardian reports Dr hasn't wanted to change any of his medications for some times.  She was concerned that his VNS setting may not be back to what they were before his battery was replaced.  I  compared previous settings to most recent settings in January, they were back to where he was before. Guardian says she would like to get bloodwork to see if there is any potential cause there.  Given he hasn't had changes to his medication in some time and none of his medications are level dependent, I don't think he needs trough levels, but I would be happy to get CBC, CMP, Thyroid panel to ensure nothing is decreasing his seizure threshold.  Guardian would prefer to come into our office Thursday for labs.   I put in orders and advised she can come any time Thursday except over lunch.  I let her know I will send this to Dr Saturday and make sure to discuss with him before Thursday as well.    Saturday MD [facility]

## 2020-11-18 LAB — CBC WITH DIFFERENTIAL/PLATELET
Absolute Monocytes: 494 cells/uL (ref 200–950)
Basophils Absolute: 10 cells/uL (ref 0–200)
Basophils Relative: 0.2 %
Eosinophils Absolute: 31 cells/uL (ref 15–500)
Eosinophils Relative: 0.6 %
HCT: 46.4 % (ref 38.5–50.0)
Hemoglobin: 15.3 g/dL (ref 13.2–17.1)
Lymphs Abs: 1139 cells/uL (ref 850–3900)
MCH: 28.2 pg (ref 27.0–33.0)
MCHC: 33 g/dL (ref 32.0–36.0)
MCV: 85.5 fL (ref 80.0–100.0)
MPV: 8.8 fL (ref 7.5–12.5)
Monocytes Relative: 9.5 %
Neutro Abs: 3526 cells/uL (ref 1500–7800)
Neutrophils Relative %: 67.8 %
Platelets: 209 10*3/uL (ref 140–400)
RBC: 5.43 10*6/uL (ref 4.20–5.80)
RDW: 12.9 % (ref 11.0–15.0)
Total Lymphocyte: 21.9 %
WBC: 5.2 10*3/uL (ref 3.8–10.8)

## 2020-11-18 LAB — COMPREHENSIVE METABOLIC PANEL
AG Ratio: 1.8 (calc) (ref 1.0–2.5)
ALT: 16 U/L (ref 9–46)
AST: 13 U/L (ref 10–40)
Albumin: 4.2 g/dL (ref 3.6–5.1)
Alkaline phosphatase (APISO): 49 U/L (ref 36–130)
BUN: 9 mg/dL (ref 7–25)
CO2: 26 mmol/L (ref 20–32)
Calcium: 9.4 mg/dL (ref 8.6–10.3)
Chloride: 102 mmol/L (ref 98–110)
Creat: 0.76 mg/dL (ref 0.60–1.35)
Globulin: 2.3 g/dL (calc) (ref 1.9–3.7)
Glucose, Bld: 95 mg/dL (ref 65–139)
Potassium: 4.1 mmol/L (ref 3.5–5.3)
Sodium: 137 mmol/L (ref 135–146)
Total Bilirubin: 0.4 mg/dL (ref 0.2–1.2)
Total Protein: 6.5 g/dL (ref 6.1–8.1)

## 2020-11-18 LAB — THYROID PANEL WITH TSH
Free Thyroxine Index: 2.2 (ref 1.4–3.8)
T3 Uptake: 30 % (ref 22–35)
T4, Total: 7.4 ug/dL (ref 4.9–10.5)
TSH: 0.7 mIU/L (ref 0.40–4.50)

## 2020-11-23 ENCOUNTER — Ambulatory Visit (INDEPENDENT_AMBULATORY_CARE_PROVIDER_SITE_OTHER): Payer: Medicaid Other | Admitting: Pediatrics

## 2020-11-23 ENCOUNTER — Ambulatory Visit: Payer: Medicaid Other | Admitting: Pulmonary Disease

## 2020-11-23 ENCOUNTER — Other Ambulatory Visit: Payer: Self-pay

## 2020-11-23 VITALS — Wt 190.0 lb

## 2020-11-23 DIAGNOSIS — R451 Restlessness and agitation: Secondary | ICD-10-CM | POA: Diagnosis not present

## 2020-11-23 DIAGNOSIS — H9203 Otalgia, bilateral: Secondary | ICD-10-CM | POA: Diagnosis not present

## 2020-11-23 NOTE — Patient Instructions (Signed)
Earache, Adult An earache, or ear pain, can be caused by many things, including: An infection. Ear wax buildup. Ear pressure. Something in the ear that should not be there (foreign body). A sore throat. Tooth problems. Jaw problems. Treatment of the earache will depend on the cause. If the cause is not clear or cannot be determined, you may need to watch your symptoms until your earache goes away or until a cause is found. Follow these instructions at home: Medicines Take or apply over-the-counter and prescription medicines only as told by your health care provider. If you were prescribed an antibiotic medicine, use it as told by your health care provider. Do not stop using the antibiotic even if you start to feel better. Do not put anything in your ear other than medicine that is prescribed by your health care provider. Managing pain If directed, apply heat to the affected area as often as told by your health care provider. Use the heat source that your health care provider recommends, such as a moist heat pack or a heating pad. Place a towel between your skin and the heat source. Leave the heat on for 20-30 minutes. Remove the heat if your skin turns bright red. This is especially important if you are unable to feel pain, heat, or cold. You may have a greater risk of getting burned. If directed, put ice on the affected area as often as told by your health care provider. To do this:   Put ice in a plastic bag. Place a towel between your skin and the bag. Leave the ice on for 20 minutes, 2-3 times a day. General instructions Pay attention to any changes in your symptoms. Try resting in an upright position instead of lying down. This may help to reduce pressure in your ear and relieve pain. Chew gum if it helps to relieve your ear pain. Treat any allergies as told by your health care provider. Drink enough fluid to keep your urine pale yellow. It is up to you to get the results of any  tests that were done. Ask your health care provider, or the department that is doing the tests, when your results will be ready. Keep all follow-up visits as told by your health care provider. This is important. Contact a health care provider if: Your pain does not improve within 2 days. Your earache gets worse. You have new symptoms. You have a fever. Get help right away if you: Have a severe headache. Have a stiff neck. Have trouble swallowing. Have redness or swelling behind your ear. Have fluid or blood coming from your ear. Have hearing loss. Feel dizzy. Summary An earache, or ear pain, can be caused by many things. Treatment of the earache will depend on the cause. Follow recommendations from your health care provider to treat your ear pain. If the cause is not clear or cannot be determined, you may need to watch your symptoms until your earache goes away or until a cause is found. Keep all follow-up visits as told by your health care provider. This is important. This information is not intended to replace advice given to you by your health care provider. Make sure you discuss any questions you have with your health care provider. Document Revised: 12/27/2018 Document Reviewed: 12/27/2018 Elsevier Patient Education  2022 Elsevier Inc.  

## 2020-11-23 NOTE — Progress Notes (Signed)
Subjective:    Ann is a 30 y.o. old male here with his mother for Otalgia   HPI: Brave presents with history of has been slapping both ears and mostly left.  He is non verbal and can not tell us what he is feeling.  He has been very upset and fussy.  He has had long history of ear infection in past.  Denies any other symptoms like diff breasthing, wheezing, v/d, lethargy, fevers.  Gave him ibuprofen for pain but no improvement.     The following portions of the patient's history were reviewed and updated as appropriate: allergies, current medications, past family history, past medical history, past social history, past surgical history and problem list.  Review of Systems Pertinent items are noted in HPI.   Allergies: Allergies  Allergen Reactions   Depakote [Divalproex Sodium] Other (See Comments)    Low Platelets   Lyrica [Pregabalin] Other (See Comments)    Sleepiness   Penicillins Hives and Rash    Has patient had a PCN reaction causing immediate rash, facial/tongue/throat swelling, SOB or lightheadedness with hypotension: Unknown Has patient had a PCN reaction causing severe rash involving mucus membranes or skin necrosis: Yes Has patient had a PCN reaction that required hospitalization: No Has patient had a PCN reaction occurring within the last 10 years: No If all of the above answers are "NO", then may proceed with Cephalosporin use.    Topamax [Topiramate] Other (See Comments)    Weight Loss     Current Outpatient Medications on File Prior to Visit  Medication Sig Dispense Refill   acetaminophen (TYLENOL) 500 MG tablet Take 500 mg by mouth every 6 (six) hours as needed for mild pain, moderate pain, fever or headache.     albuterol (PROVENTIL) (2.5 MG/3ML) 0.083% nebulizer solution Take 3 mLs (2.5 mg total) by nebulization every 6 (six) hours as needed for wheezing or shortness of breath. 75 mL 12   budesonide (PULMICORT) 0.5 MG/2ML nebulizer solution Take 2 mLs (0.5 mg  total) by nebulization daily. 60 mL 12   chlorthalidone (HYGROTON) 25 MG tablet Take 25 mg by mouth 2 (two) times daily.     cholecalciferol (VITAMIN D) 1000 units tablet Take 1,000 Units by mouth daily.     ciprofloxacin-dexamethasone (CIPRODEX) OTIC suspension Place 4 drops into both ears 2 (two) times daily. 7.5 mL 12   diazepam (DIASTAT ACUDIAL) 20 MG GEL Give 15 mg rectally after 2 minutes of seizure. 2 each 5   KEPPRA 1000 MG tablet Take 1-1/2 tablets twice daily 93 tablet 5   LORazepam (ATIVAN) 1 MG tablet Take 1 tablet by mouth as needed for agitation. 30 tablet 5   oseltamivir (TAMIFLU) 75 MG capsule Take 1 capsule (75 mg total) by mouth 2 (two) times daily. 10 capsule 0   OXTELLAR XR 600 MG TB24 Take 3 tablets by mouth at bedtime. 93 tablet 5   polyethylene glycol powder (GLYCOLAX/MIRALAX) 17 GM/SCOOP powder Take by mouth.     potassium chloride (KLOR-CON) 20 MEQ packet Take 20 mEq by mouth 2 (two) times daily as needed (lethargic).      spironolactone (ALDACTONE) 50 MG tablet Take 50 mg by mouth daily.     VIMPAT 100 MG TABS TAKE ONE TABLET IN THE MORNING AND TAKE ONE TABLET AT 3PM 62 tablet 5   VIMPAT 200 MG TABS tablet TAKE 2 TABLETS AT BEDTIME 62 tablet 5   No current facility-administered medications on file prior to visit.  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 Theressa Stamps. Patient's AHI was 39.6 RDI is 43.6 positional component. Patient will need desensitization and a gentle approach to CPAP titration.    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 .         Objective:    Wt 190 lb (86.2 kg)  Comment: told by parent  BMI 33.66 kg/m   General: alert, active, non toxic, non verbal, wheelchair bound ENT: oropharynx moist, OP clear, no lesions, nares no discharge, no oral lesions or swollen gums Eye:  PERRL, EOMI, conjunctivae clear, no discharge Ears: left TM clears w/ tube patent, no discharge, right TM tube angled, no drainage and clear TM, partial cerumen in area Neck: supple, no sig LAD Lungs: clear to auscultation, no wheeze, crackles or retractions Heart: RRR, Nl S1, S2, no murmurs Abd: soft, non tender, non distended, normal BS, no organomegaly, no masses appreciated Skin: no rashes Neuro: normal mental status, No focal deficits  No results found for this or any previous visit (from the past 72 hour(s)).     Assessment:   Reedy is a 30 y.o. old male with  1. Otalgia of both ears   2. Restlessness and agitation     Plan:   1.  Consider otalgia with normal ear/TM exam.  Consider onset of HA or viral illness that could be reason for his discomfort.  Mom to monitor progression of any symptoms and return if worsening.  Ibuprofen for perceived pain.     No orders of the defined types were placed in this encounter.    Return if symptoms worsen or fail to improve. in 2-3 days or prior for concerns  Myles Gip, DO

## 2020-11-27 ENCOUNTER — Encounter: Payer: Self-pay | Admitting: Pediatrics

## 2020-12-21 ENCOUNTER — Other Ambulatory Visit: Payer: Self-pay

## 2020-12-21 ENCOUNTER — Ambulatory Visit (INDEPENDENT_AMBULATORY_CARE_PROVIDER_SITE_OTHER): Payer: Medicaid Other | Admitting: Pediatrics

## 2020-12-21 ENCOUNTER — Encounter (INDEPENDENT_AMBULATORY_CARE_PROVIDER_SITE_OTHER): Payer: Self-pay | Admitting: Pediatrics

## 2020-12-21 VITALS — BP 120/90 | HR 84 | Wt 173.0 lb

## 2020-12-21 DIAGNOSIS — G40209 Localization-related (focal) (partial) symptomatic epilepsy and epileptic syndromes with complex partial seizures, not intractable, without status epilepticus: Secondary | ICD-10-CM

## 2020-12-21 DIAGNOSIS — G40319 Generalized idiopathic epilepsy and epileptic syndromes, intractable, without status epilepticus: Secondary | ICD-10-CM

## 2020-12-21 DIAGNOSIS — F840 Autistic disorder: Secondary | ICD-10-CM

## 2020-12-21 DIAGNOSIS — R451 Restlessness and agitation: Secondary | ICD-10-CM | POA: Diagnosis not present

## 2020-12-21 DIAGNOSIS — G808 Other cerebral palsy: Secondary | ICD-10-CM

## 2020-12-21 DIAGNOSIS — Z9689 Presence of other specified functional implants: Secondary | ICD-10-CM

## 2020-12-21 MED ORDER — VIMPAT 200 MG PO TABS: 400.0000 mg | ORAL_TABLET | Freq: Every day | ORAL | 5 refills | Status: DC

## 2020-12-21 MED ORDER — OXTELLAR XR 600 MG PO TB24: 3.0000 | ORAL_TABLET | Freq: Every day | ORAL | 5 refills | Status: DC

## 2020-12-21 MED ORDER — KEPPRA 1000 MG PO TABS: ORAL_TABLET | ORAL | 5 refills | Status: DC

## 2020-12-21 MED ORDER — VIMPAT 100 MG PO TABS: ORAL_TABLET | ORAL | 5 refills | Status: DC

## 2020-12-21 MED ORDER — LORAZEPAM 2 MG PO TABS: ORAL_TABLET | ORAL | 5 refills | Status: DC

## 2020-12-21 NOTE — Progress Notes (Signed)
Patient: Zachary [name] MRN: [id number] Sex: male DOB: [birth year]  Provider: Ellison Carwin, MD Location of Care: [facility]  Note type: Routine return visit  History of Present Illness: Referral Source: [name], D History from: mother, patient, and [facility] chart Chief Complaint: VNS/Epilepsy  Zachary [name] is a 30 y.o. male who was evaluated December 21, 2020 for the first time since June 17, 2020.  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  We received a phone call November 14, 2020 that described four seizures.  2 with generalized tonic-clonic 1 on the first lasting 90 seconds, 1 on the eighth lasting 40 seconds.  On the same day he had a focal epilepsy with impairment of consciousness lasting a minute.  The fourth was at school on June 13 and little information was available.  I was away and my partner ordered laboratories, and reviewed the chart.  CBC was entirely normal.  Comprehensive metabolic panel showed a glucose of 115 which is slightly elevated but not clinically significant.  Thyroid panel was normal.  On the 13th he actually had 2 more seizures, both of them less than a minute.  As best I know there have been no seizures since that time.  I do not know why Balraj clusters like this.  He already sees his medication on a timely basis.  His vagal nerve stimulator is working well (see below) he gets about the same amount of sleep every night.  His general health is good.  He had successful dental surgery at the end of June.  He has periods when he becomes irritable and agitated but this is been present for a long time.  He attends a day program at [employer] 6 hours a day which provides social interaction for Carey and care so that his mother can work outside the home.  Is able to walk independently without aids.  He has a wheelchair for community transportation.  He can eat finger foods  and drink from a straw.  He is dependent for dressing, hygiene, and all other activities of daily living.  Procedure: interrogation of vagal nerve stimulator    The implanted device is Aspire SR [id number], serial # [id number], implanted Oct 28, 2019.   On interrogation: Normal amplitude 2.50 mA, frequency 20 Hz, pulse width 250 s, stimulation duration 7 seconds, stimulation interval 0.8 minutes. Magnet amplitude 2.75 mA, magnet stimulation duration 60 seconds, magnet pulse width 250 s.   Auto stimulation cannot be programmed because of the frequency of the regular stimulation 7 seconds on, 48 seconds off. Number of stimuli since implantation is 86.  He had about 37 since his last visit. System diagnostics with an amplitude of 1.0 milliamps again showed intact communication, output, and impedance of 1615 ohms. There is no indication to change the battery (75 to 100%) duty cycle is 20%..  He tolerated the procedure well.  A copy of the report will be placed in the media section of the chart.  Review of Systems: A complete review of systems was remarkable for patient is here to be seen for a follow up. Mom reports that the patient had an increase in gran mal seizures during the month of June. She reports that she has no other concerns at this time, all other systems reviewed and negative.  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 Theressa StampsJames Crowe. Patient's AHI was 39.6 RDI is 43.6 positional component. Patient will need desensitization and a gentle approach to CPAP titration.    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 Marland Kitchen.    Hospitalizations: No., Head Injury: No.,  Nervous System Infections: No., Immunizations up to date: Yes.    Copied from prior chart notes I began to see him on July 10, 2004. He apparently was seen by me when he was eight months of age with developmental delay. Seizure activity; however, did not began until he is 14, two days after starting amitriptyline. 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 His most recent CT scan of the brain was on September 29, 2009, and showed no significant changes.    EEG showed diffuse background slowing.   He was treated with Depakote, Topamax, and Lyrica, but suffered side effects and agitation from those medications. He has taken Trileptal, Keppra, and Vimpat for quite some time.   [facility] Oct [facility], [facility] for 6 days.for a phase 1 evaluation. MRI of the brain showed ex-vacuo dilatation of his ventricles and cortical atrophy. Volume loss most pronounced in the parietal lobes bilaterally, temporal lobes with widening of the sylvian fissures and a paucity of white matter diffusely most pronounced again in the parietal lobes with patchy T2 hyperintensity. 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 There was no evidence of mesial temporal sclerosis.    Magnetoencephalogram showed epileptiform activity arising from the left posterior temple region with a field extending to the occipital lobe. This was not seen with concurrent routine EEG.    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 This was somewhat worse in the right than the left bilateral temporal cortex and temporal pole suggesting a longstanding insult.    Prolonged video EEG showed onset of bifrontal slowing that  evolved into rhythmic activity with spikes of greater amplitude over the right central region with secondary generalization. Clinically, the patient has onset of a myoclonic jerk with his head turned to the left, movement of the left arm and leg, then tonic posturing followed by tonic-clonic generalized movements.    One day later he had bifrontal rhythmic beta range activity followed by decrement in voltage with fast beta range activity for about 30 seconds and then generalized rhythmic beta range activity. The patient had a slight head bob slowly lowered to the bed with stiffening of both upper extremities and tonic-clonic movement of both upper extremities followed by secondary generalization in his legs. A total of seven seizures were monitored. The last three were obscured by electrode artifact. Seizure onset appeared to occur from both frontal regions. This led to the recommendations to place the vagal nerve stimulator.   .   He has frequent ear infections, urinary tract infections, constipation and a hip fracture. He has had problems with neutropenia and thrombocytopenia. He had some vomiting in June, 2010  and developed aspiration pneumonia. He tends to have a temperature of 99 in the late evening.   He had several episodes in which his left knee has been dislocated. This happened as recently as August 28, 2009. He sat down in a chair and his knee went out of place. EMS was called and he was taken to the ER. While waiting for the ER physician, Mcgregor stretched and his knee went back in to place. His mother said that he was subsequently seen by his orthopedist, who thinks that he may eventually need surgery on his knee.    Home sleep study December 20, 2013 through [facility] showed evidence of obstructive apnea with desaturations.  This is discussed in detail in the February 11, 2014 note in his chart.  He was prescribed CPAP and is able to wear it to some extent but does not have a high quality  sleep.  In addition the study showed that when the vagal nerve stimulator discharges, there are brief periods of apnea corresponding to the discharge.   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 Family history is negative for migraines, seizures, intellectual disabilities, blindness, deafness, birth defects, chromosomal disorder, or autism.  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    He enjoys swimming and going to the beach.   Allergies Allergen Reactions   Depakote [Divalproex Sodium] Other (See Comments)    Low Platelets   Lyrica [Pregabalin] Other (See Comments)    Sleepiness   Penicillins Hives and Rash    Has patient had a PCN reaction causing immediate rash, facial/tongue/throat swelling, SOB or lightheadedness with hypotension: Unknown Has patient had a PCN reaction causing severe rash involving mucus membranes or skin necrosis: Yes Has patient had a PCN reaction that required hospitalization: No Has patient had a PCN reaction occurring within the last 10 years: No If all of the above answers are "NO", then may proceed with Cephalosporin use.    Topamax [Topiramate] Other  (See Comments)    Weight Loss   Physical Exam BP 120/90   Pulse 76   Wt 173 lb (78.5 kg)   BMI 30.65 kg/m   General: alert, well developed, well nourished, in no acute distress, black hair, brown eyes, right handed Head: normocephalic, no dysmorphic features Ears, Nose and Throat: Otoscopic: tympanic membranes normal Neck: supple, full range of motion, no cranial or cervical bruits Respiratory: auscultation clear Cardiovascular: no murmurs, pulses are normal Musculoskeletal: flexion contractures that can be reduced at the elbows, left wrist, knees, and ankles Skin: no rashes or neurocutaneous lesions  Neurologic Exam  Mental Status: alert; does  not look at the examiner when his name is called, he looks away when I try to engage him.  He does not follow commands nor does he speak. Cranial Nerves: visual fields are full to double simultaneous stimuli (objects brought in from the periphery; extraocular movements are full and dysconjugate; pupils are round reactive to light; funduscopic examination shows bilateral positive red reflex; midline tongue; localizes sound bilaterally Motor: spastic quadriparesis with sparing of the right arm; clumsy fine motor movements; clawhand deformity on the left, unable to test pronator drift Sensory: withdrawal x4 Coordination: unable to test Gait and Station: broad-based, shuffling, diplegic Reflexes: symmetric and diminished bilaterally; a couple of beats of bilateral ankle clonus; bilateral neutral plantar responses   Assessment 1.  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 2.  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 3.  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 4.  Obstructive sleep apnea, G47.33.  Discussion Zachary [name] is medically and neurologically stable.  He has clusters of seizures.  He had 7 in early June and has had none since that  time.  Plan Overall his polypharmacy for antiepileptic medications and vagal nerve stimulator have combined to keep his seizures relatively infrequent.  I think it is unlikely that further increasing his medication is going to bring about better control and may just make him sleepier or more unsteady.  His vagal nerve stimulator is working well.  His mother had the opportunity to meet Dr. Lezlie Lye today.  She will provide care and will see him again in 4 months time.  Greater than 50% of a 30-minute visit was spent counseling coordination of care concerning his seizures, autism, quadriparesis, and issues related to transition of care.  His vagal nerve stimulator was interrogated but not reprogrammed.  The obstructive sleep apnea is unusual in that it only happens when the vagal nerve stimulator is discharging, and not at other times.  Prescriptions were issued for Keppra, Oxtellar XR, and both Vimpat tablets as well as an increased dose of lorazepam 2 mg (increased from 1 mg).  I will be available for concerns until March 03, 2021.  After that time Dr. Moody Bruins  will provide his care.   Medication List    Accurate as of December 21, 2020  2:40 PM. If you have any questions, ask your nurse or doctor.     acetaminophen 500 MG tablet Commonly known as: TYLENOL Take 500 mg by mouth every 6 (six) hours as needed for mild pain, moderate pain, fever or headache.   albuterol (2.5 MG/3ML) 0.083% nebulizer solution Commonly known as: PROVENTIL Take 3 mLs (2.5 mg total) by nebulization every 6 (six) hours as needed for wheezing or shortness of breath.   budesonide 0.5 MG/2ML nebulizer solution Commonly known as: PULMICORT Take 2 mLs (0.5 mg total) by nebulization daily.   chlorthalidone 25 MG tablet Commonly known as: HYGROTON Take 25 mg by mouth 2 (two) times daily.   cholecalciferol 1000 units tablet Commonly known as: VITAMIN D Take 1,000 Units by mouth daily.    Cholecalciferol 50 MCG (2000 UT) Caps Take 1 tablet by mouth at bedtime.   ciprofloxacin-dexamethasone OTIC suspension Commonly known as: CIPRODEX Place 4 drops into both ears 2 (two) times daily.   diazepam 20 MG Gel Commonly known as: Diastat AcuDial Give 15 mg rectally after 2 minutes of seizure.   DSS 100 MG Caps Take by mouth.   Keppra 1000 MG tablet Generic drug: levETIRAcetam Take  1-1/2 tablets twice daily   LORazepam 2 MG tablet Commonly known as: ATIVAN Take 1 tablet by mouth as needed for agitation.   Oxtellar XR 600 MG Tb24 Generic drug: OXcarbazepine ER Take 3 tablets by mouth at bedtime.   polyethylene glycol powder 17 GM/SCOOP powder Commonly known as: GLYCOLAX/MIRALAX Take by mouth.   potassium chloride 20 MEQ packet Commonly known as: KLOR-CON Take 20 mEq by mouth 2 (two) times daily as needed (lethargic).   spironolactone 50 MG tablet Commonly known as: ALDACTONE Take 50 mg by mouth daily.   Vimpat 200 MG Tabs tablet Generic drug: lacosamide TAKE 2 TABLETS AT BEDTIME   Vimpat 100 MG Tabs Generic drug: Lacosamide TAKE ONE TABLET IN THE MORNING AND TAKE ONE TABLET AT 3PM     The medication list was reviewed and reconciled. All changes or newly prescribed medications were explained.  A complete medication list was provided to the patient/caregiver.  Deetta Perla MD

## 2020-12-21 NOTE — Progress Notes (Signed)
Patient was evaluated by Dr Sharene Skeans for follow up.    Zachary Lye, MD

## 2020-12-21 NOTE — Patient Instructions (Addendum)
At [facility], we are committed to providing exceptional care. You will receive a patient satisfaction survey through text or email regarding your visit today. Your opinion is important to me. Comments are appreciated.   It has been a pleasure to provide care to [place] over the years.  I am leaving you in very capable hands.  I do not have any plans to change his antiepileptic medications.  I will get rid of Nayzilam out of the chart because you intend to use Diastat if he needs a rescue drug.  I like him to return in 4 months' time to see Dr. Lezlie Lye.  If there is anything you need between now and September 30 please let me know after that time contact her through MyChart.

## 2021-01-02 ENCOUNTER — Ambulatory Visit: Payer: Medicaid Other | Admitting: Pediatrics

## 2021-01-02 ENCOUNTER — Other Ambulatory Visit: Payer: Self-pay | Admitting: Pediatrics

## 2021-01-02 MED ORDER — SULFAMETHOXAZOLE-TRIMETHOPRIM 800-160 MG PO TABS: 1.0000 | ORAL_TABLET | Freq: Two times a day (BID) | ORAL | 4 refills | Status: AC

## 2021-01-02 NOTE — Progress Notes (Signed)
Called in meds for ear infection

## 2021-01-03 ENCOUNTER — Telehealth (INDEPENDENT_AMBULATORY_CARE_PROVIDER_SITE_OTHER): Payer: Medicaid Other | Admitting: Pulmonary Disease

## 2021-01-03 DIAGNOSIS — R0602 Shortness of breath: Secondary | ICD-10-CM | POA: Diagnosis not present

## 2021-01-03 NOTE — Progress Notes (Signed)
Virtual Visit via Video Note  I connected with Zachary [name] on 01/03/21 at  4:00 PM EDT by a video enabled telemedicine application and verified that I am speaking with the correct person using two identifiers.  Location: Patient: Zachary [name].  Patient's caregiver Ms. Mertha Finders. Provider: Stephannie Peters discussed the limitations of evaluation and management by telemedicine and the availability of in person appointments. The patient expressed understanding and agreed to proceed.  History of Present Illness: Shortness of breath, congestion, wheezing in the mornings  Following neb treatments, oxygen supplementation symptoms would usually resolve in about an hour to an hour and a half and will be back to his usual  Sleeps well  Recently being treated for a ear infection   Observations/Objective: Appears well on video  Assessment and Plan: Shortness of breath  Bronchospasms in the mornings  Follow Up Instructions: No changes to current plan Continue albuterol as needed  Oxygen segmentation as needed  Reassurance, symptoms do resolve after about an hour of intervention with nebulization treatment and oxygen supplementation   I discussed the assessment and treatment plan with the patient. The patient was provided an opportunity to ask questions and all were answered. The patient agreed with the plan and demonstrated an understanding of the instructions.   The patient was advised to call back or seek an in-person evaluation if the symptoms worsen or if the condition fails to improve as anticipated.  I provided 10 minutes of non-face-to-face time during this encounter.   Tomma Lightning, MD

## 2021-02-11 ENCOUNTER — Other Ambulatory Visit: Payer: Self-pay | Admitting: Family

## 2021-02-11 DIAGNOSIS — G40209 Localization-related (focal) (partial) symptomatic epilepsy and epileptic syndromes with complex partial seizures, not intractable, without status epilepticus: Secondary | ICD-10-CM

## 2021-02-21 ENCOUNTER — Other Ambulatory Visit: Payer: Self-pay | Admitting: Pediatrics

## 2021-02-21 MED ORDER — CLINDAMYCIN HCL 300 MG PO CAPS: 300.0000 mg | ORAL_CAPSULE | Freq: Three times a day (TID) | ORAL | 0 refills | Status: AC

## 2021-02-21 NOTE — Progress Notes (Signed)
Clinda send to [employer]

## 2021-03-20 DIAGNOSIS — Z9622 Myringotomy tube(s) status: Secondary | ICD-10-CM | POA: Insufficient documentation

## 2021-04-06 MED ORDER — CLINDAMYCIN HCL 300 MG PO CAPS: 300.0000 mg | ORAL_CAPSULE | Freq: Three times a day (TID) | ORAL | 0 refills | Status: AC

## 2021-04-06 MED ORDER — CHLORHEXIDINE GLUCONATE 4 % EX LIQD: Freq: Every day | CUTANEOUS | 0 refills | Status: DC | PRN

## 2021-04-06 NOTE — Addendum Note (Signed)
Addended by: [name] on: 04/06/2021 09:35 PM   Modules accepted: Orders

## 2021-04-24 ENCOUNTER — Ambulatory Visit (INDEPENDENT_AMBULATORY_CARE_PROVIDER_SITE_OTHER): Payer: Medicaid Other | Admitting: Pediatrics

## 2021-04-25 MED ORDER — CLINDAMYCIN HCL 300 MG PO CAPS: 300.0000 mg | ORAL_CAPSULE | Freq: Three times a day (TID) | ORAL | 0 refills | Status: AC

## 2021-04-25 MED ORDER — BUDESONIDE 0.5 MG/2ML IN SUSP: 0.5000 mg | Freq: Every day | RESPIRATORY_TRACT | 12 refills | Status: DC

## 2021-04-25 NOTE — Addendum Note (Signed)
Addended by: [name] on: 04/25/2021 09:26 PM   Modules accepted: Orders

## 2021-05-12 ENCOUNTER — Ambulatory Visit (INDEPENDENT_AMBULATORY_CARE_PROVIDER_SITE_OTHER): Payer: Medicaid Other | Admitting: Pediatrics

## 2021-05-15 ENCOUNTER — Ambulatory Visit (INDEPENDENT_AMBULATORY_CARE_PROVIDER_SITE_OTHER): Payer: Medicaid Other | Admitting: Pediatrics

## 2021-05-22 ENCOUNTER — Other Ambulatory Visit: Payer: Self-pay

## 2021-05-22 ENCOUNTER — Ambulatory Visit (INDEPENDENT_AMBULATORY_CARE_PROVIDER_SITE_OTHER): Payer: Medicaid Other | Admitting: Pediatrics

## 2021-05-22 VITALS — BP 122/68 | Ht 63.0 in | Wt 188.0 lb

## 2021-05-22 DIAGNOSIS — G808 Other cerebral palsy: Secondary | ICD-10-CM

## 2021-05-22 DIAGNOSIS — Z0001 Encounter for general adult medical examination with abnormal findings: Secondary | ICD-10-CM | POA: Diagnosis not present

## 2021-05-22 DIAGNOSIS — F840 Autistic disorder: Secondary | ICD-10-CM

## 2021-05-22 DIAGNOSIS — L819 Disorder of pigmentation, unspecified: Secondary | ICD-10-CM | POA: Diagnosis not present

## 2021-05-22 DIAGNOSIS — Z0000 Encounter for general adult medical examination without abnormal findings: Secondary | ICD-10-CM

## 2021-05-22 DIAGNOSIS — G40319 Generalized idiopathic epilepsy and epileptic syndromes, intractable, without status epilepticus: Secondary | ICD-10-CM

## 2021-05-22 MED ORDER — KETOCONAZOLE 2 % EX SHAM: 1.0000 "application " | MEDICATED_SHAMPOO | CUTANEOUS | 4 refills | Status: AC

## 2021-05-22 NOTE — Progress Notes (Signed)
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 Needs refills on antibiotic ear drops Needs prescription for oxygen --[facility]. Urinary retention--need for catheters Risk of COVID-19--need for masks Gaining weight and mom has trouble moving the wheelchair---needs a ELECTRIC wheelchair  [place] dermatology referral ----hyperpigmented skin lesions   Dr A ---at [facility]y  ENT --Dr Lennox Pippins Kidney --BP  [facility] bed  Chill out chair  SHower chair in shower  Nile lift  Diaoers   Pads   Ensure  Developmental History Developmental delay  Function: Mobility: manual wheelchair Pain concerns: no Hand function: Right: reduced dexterity Left: reduced dexterity Spine curvature: mild Swallowing: normal and modified diet (pureed) Toileting: dependent   Review of Systems: Vision: needs testing Hearing: followed by ENT Seizures: yes - controlled Constipation: no Fractures: no  The following portions of the patient's history were reviewed and updated as appropriate: allergies, current medications, past family history, past medical history, past social history, past surgical history and problem list.  Equipment:  Clinical cytogeneticist Pulse ox Oxygen tubes and tank Diapers and pads Wipes and gloves Sleep apnea machine  DIET--Ensure 1 per day   Specialists- Neuro-DR A  ENT --[place] Ortho-Dr Gu[place] Shi Pulmonary-N/A Cardio--N/A Endocrinology--N/A Dental--Needs one Ophthal--Dr Maple Hudson Urology--[place] Surgeon--N/A   Objective:    Physical Exam  Cognition: non-interactive HEENT---normal Eyes---left lower eyelid cyst Respiratory: normal, no increased effort CVS--No murmurs --good heart sounds Lower extremity function:  decreased Abdomen: normal--no G tube present Spine scoliosis: mild  Sitting Ability: assisted Gait: wheelchair     Assessment:   Annual Check up  Seizures Autism spectrum Quadriparesis Wheelchair bound   Plan:     Gross motor: delayed  Fine motor/ADL: delayed  Orthopedics/bracing: bilateral AFO's --present today 7. Other equipment:  bath chair, gait trainer, hand/wrist splint(s), life system, stander, walker, wheelchair and RAMP for house.  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 Needs refills on antibiotic ear drops Needs prescription for oxygen --[facility]. Urinary retention--need for catheters Risk of COVID-19--need for masks Gaining weight and mom has trouble moving the wheelchair---needs a ELECTRIC one

## 2021-05-30 ENCOUNTER — Encounter: Payer: Self-pay | Admitting: Pediatrics

## 2021-05-30 DIAGNOSIS — L819 Disorder of pigmentation, unspecified: Secondary | ICD-10-CM | POA: Insufficient documentation

## 2021-05-30 NOTE — Patient Instructions (Signed)
Refer to dermatology for follow-up. 

## 2021-06-01 ENCOUNTER — Ambulatory Visit (INDEPENDENT_AMBULATORY_CARE_PROVIDER_SITE_OTHER): Payer: Medicaid Other | Admitting: Pediatrics

## 2021-06-01 ENCOUNTER — Encounter (INDEPENDENT_AMBULATORY_CARE_PROVIDER_SITE_OTHER): Payer: Self-pay | Admitting: Pediatrics

## 2021-06-01 ENCOUNTER — Other Ambulatory Visit: Payer: Self-pay

## 2021-06-01 VITALS — BP 120/88 | HR 88 | Ht 64.0 in | Wt 188.0 lb

## 2021-06-01 DIAGNOSIS — G4733 Obstructive sleep apnea (adult) (pediatric): Secondary | ICD-10-CM

## 2021-06-01 DIAGNOSIS — Z9689 Presence of other specified functional implants: Secondary | ICD-10-CM | POA: Diagnosis not present

## 2021-06-01 DIAGNOSIS — G40209 Localization-related (focal) (partial) symptomatic epilepsy and epileptic syndromes with complex partial seizures, not intractable, without status epilepticus: Secondary | ICD-10-CM

## 2021-06-01 DIAGNOSIS — G40319 Generalized idiopathic epilepsy and epileptic syndromes, intractable, without status epilepticus: Secondary | ICD-10-CM | POA: Diagnosis not present

## 2021-06-01 DIAGNOSIS — G808 Other cerebral palsy: Secondary | ICD-10-CM

## 2021-06-01 DIAGNOSIS — F840 Autistic disorder: Secondary | ICD-10-CM | POA: Diagnosis not present

## 2021-06-01 NOTE — Progress Notes (Signed)
Patient: HA PLACERES MRN: [id number] Sex: male DOB: 10-Mar-1991  Provider: Lezlie Lye, MD Location of Care: [facility]- [facility] Note type: Routine return visit Referral Source: [name], MD Date of Evaluation: 06/01/2021 Chief Complaint: epilepsy/ VNS follow up  Interim History: KITT LEDET is a 30 y.o. male with history significant for focal epilepsy with impairment of consciousness, secondary generalization, autism spectrum disorder,  congenital quadriparesis, obstructive sleep apnea and intellectual disability. Patient presents today with  his mother.  He has had 2 seizures this past month both in sleep. They last 2 min each and then he goes into deep sleep and wakes up afraid. Last seizure Monday 05/29/2021. He was asleep in the morning around 6:30am on the couch. Mother reports he was snoring and then his arms go up and start shaking violently and his legs are stiff with eyes rolled back. She reports swiping the magnet for his VNS and applying oxygen. Mother reports these episodes last 1.5-2 min. She does not have to give any rescue medication for seizures to get them to stop. She reports having medication ready but instructed to wait until 5 min of seizure to give medication. No missing doses of daily medication. He gets good sleep every night and if he cannot sleep he gets melatonin gummies that seem to help him get to sleep for the night. Mother reports 5mg  of melatonin as needed. When he is anxious, she reports giving lorazepam 2mg  to help calm and get him ready to go to sleep.   September, October, November 2022 seizure-free   He is followed [facility] for intermittent urinary retention and hypertension. Mother reports having to in-out cath intermittently. He has a growth on his bladder that fills when his bladder is too distended. Will be seeing dermatology for rash that occurs on his trunk, upper arms, and face.    Ambulatory function: he is able   Procedure: interrogation of vagal nerve stimulator  VNS settings The implant device is Aspire SR December 2022, Serial # [id number], implanted Oct 28, 2019. Lead impedance: 1615 Ohms Generator battery: OK, 75-100%     Normal Autostim Magnet  Output 2.5 mA 0 mA  2.75 mA  Frequency 20Hz       Pulse width 250usec 500usec 250usec  On time 7 sec 30 sec 60 sec  Off time 0.8 min      Duty cycle 20%         Epilepsy/seizure History: (summarize)  Age at seizure onset: 30 years of age  Description of all seizure types and duration: Semiology #1: Generalization tonic-clonic lasting up to 2 minutes in duration.  Semiology #2: Focal seizures with hiccup noises, slight tremble of hand, and unresponsiveness that stop with VNS magnet activation.  Complications from seizures (trauma, etc.): He has broken his left shoulder from seizure.  h/o status epilepticus: No  Date of most recent seizure: 05/29/2021   Seizure frequency past month (exact number or average per day): 2 Past 3 months: 2  Current AEDs and Current side effects: Keppra 1500 mg BID Oxtellar XR 1800 mg daily at night  Vimpat 100 mg in morning, 100 mg at 3 pm and 400 mg at bedtime.   Prior AEDs (d/c reason?):  Topamax discontinued due to weight loss Depakote discontinued due to low platelets  Pregabalin discontinued due to sleepiness   Other Meds:  Current Outpatient Medications on File Prior to Visit  Medication Sig   acetaminophen (TYLENOL) 500 MG tablet Take 500  mg by mouth every 6 (six) hours as needed for mild pain, moderate pain, fever or headache.   albuterol (PROVENTIL) (2.5 MG/3ML) 0.083% nebulizer solution Take 3 mLs (2.5 mg total) by nebulization every 6 (six) hours as needed for wheezing or shortness of breath.   budesonide (PULMICORT) 0.5 MG/2ML nebulizer solution Take 2 mLs (0.5 mg total) by nebulization daily.   chlorhexidine (HIBICLENS) 4 % external liquid Apply topically daily as  needed.   cholecalciferol (VITAMIN D) 1000 units tablet Take 1,000 Units by mouth daily.   Cholecalciferol 50 MCG (2000 UT) CAPS Take 1 tablet by mouth at bedtime.   ciprofloxacin-dexamethasone (CIPRODEX) OTIC suspension Place 4 drops into both ears 2 (two) times daily.   diazepam (DIASTAT ACUDIAL) 20 MG GEL Give 15 mg rectally after 2 minutes of seizure.   Docusate Sodium (DSS) 100 MG CAPS Take by mouth.   ketoconazole (NIZORAL) 2 % shampoo Apply 1 application topically 2 (two) times a week.   polyethylene glycol powder (GLYCOLAX/MIRALAX) 17 GM/SCOOP powder Take by mouth.   potassium chloride (KLOR-CON) 20 MEQ packet Take 20 mEq by mouth 2 (two) times daily as needed (lethargic).    spironolactone (ALDACTONE) 50 MG tablet Take 50 mg by mouth daily.   chlorthalidone (HYGROTON) 25 MG tablet Take 25 mg by mouth 2 (two) times daily. (Patient not taking: Reported on 06/01/2021)   Adherence Estimate: [ x] Excellent   Previous work up: Copied from previous record: He was seen at [facility] on Oct 27, 2010 for 6 days.for a phase 1 evaluation. MRI of the brain showed ex-vacuo dilatation of his ventricles and cortical atrophy. Volume loss most pronounced in the parietal lobes bilaterally, temporal lobes with widening of the sylvian fissures and a paucity of white matter diffusely most pronounced again in the parietal lobes with patchy T2 hyperintensity. 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 There was no evidence of mesial temporal sclerosis.    Magnetoencephalogram showed epileptiform activity arising from the left posterior temple region with a field extending to the occipital lobe. This was not seen with concurrent routine EEG.    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 This was somewhat worse in the right than the left bilateral  temporal cortex and temporal pole suggesting a longstanding insult.    Prolonged video EEG showed onset of bifrontal slowing that evolved into rhythmic activity with spikes of greater amplitude over the right central region with secondary generalization. Clinically, the patient has onset of a myoclonic jerk with his head turned to the left, movement of the left arm and leg, then tonic posturing followed by tonic-clonic generalized movements.    One day later he had bifrontal rhythmic beta range activity followed by decrement in voltage with fast beta range activity for about 30 seconds and then generalized rhythmic beta range activity. The patient had a slight head bob slowly lowered to the bed with stiffening of both upper extremities and tonic-clonic movement of both upper extremities followed by secondary generalization in his legs. A total of seven seizures were monitored. The last three were obscured by electrode artifact. Seizure onset appeared to occur from both frontal regions. This led to the recommendations to place the vagal nerve stimulator.  He has frequent ear infections, urinary tract infections, constipation and a hip fracture. He has had problems with neutropenia and thrombocytopenia. He had  some vomiting in June, 2010 and developed aspiration pneumonia. He tends to have a temperature of 99 in the late evening.   He had several episodes in which his left knee has been dislocated. This happened as recently as August 28, 2009. He sat down in a chair and his knee went out of place. EMS was called and he was taken to the ER. While waiting for the ER physician, Trenell stretched and his knee went back in to place. His mother said that he was subsequently seen by his orthopedist, who thinks that he may eventually need surgery on his knee.    Home sleep study December 20, 2013 through [facility] showed evidence of obstructive apnea with desaturations.  This is discussed in detail in the February 11, 2014 note in his chart.  He was prescribed CPAP and is able to wear it to some extent but does not have a high quality sleep.  In addition the study showed that when the vagal nerve stimulator discharges, there are brief periods of apnea corresponding to the discharge.  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 Theressa Stamps. Patient's AHI was 39.6 RDI is 43.6 positional component. Patient will need desensitization and a gentle approach to CPAP titration.    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 .     Past Surgical History:  Procedure Laterality Date   FOOT SURGERY     FRACTURE SURGERY     HIP SURGERY     IMPLANTATION VAGAL NERVE STIMULATOR     Pediatomy  2009   TONSILLECTOMY  2009   TYMPANOSTOMY TUBE PLACEMENT  2009    Allergies  Allergen Reactions   Depakote [Divalproex Sodium] Other (See Comments)    Low Platelets   Lyrica [Pregabalin] Other (See Comments)    Sleepiness   Penicillins Hives and Rash    Has patient had a PCN reaction causing immediate rash, facial/tongue/throat swelling, SOB or lightheadedness with hypotension: Unknown Has patient had a PCN reaction causing severe rash involving mucus membranes or skin necrosis: Yes Has patient had a PCN reaction that required hospitalization: No Has patient had a PCN reaction occurring within the last 10 years: No If all of the above answers are "NO", then may proceed with Cephalosporin use.    Topamax [Topiramate] Other (See Comments)    Weight Loss   Birth History: unclear birth history.    Developmental history: he did not achieve developmental milestones at appropriate age.  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   Schooling: he has a high school certificate. He attends a day program at [employer].   Social and family history: he lives with mother.Both parents are in apparent good health. There is no family history of speech delay, learning difficulties in school, intellectual disability, epilepsy or neuromuscular disorders. family history includes Cancer in his paternal grandmother; Diabetes in his paternal grandfather.  Review of Systems Constitutional: Negative for fever, malaise/fatigue and weight loss.  HENT: Negative for congestion, ear pain, hearing loss, sinus pain and sore throat.   Eyes: Negative for discharge and redness.  Respiratory: Negative for  cough, shortness of breath and wheezing.   Cardiovascular: Negative for chest pain, palpitations and leg swelling.  Gastrointestinal: Negative for abdominal pain, blood in stool, constipation, nausea and vomiting.  Genitourinary: Negative for dysuria and frequency.  Musculoskeletal: Negative for back pain, falls, joint pain and neck pain.  Skin: Negative for rash.  Neurological: Negative for dizziness, tremors, focal weakness,  weakness and headaches. Refractory seizures Psychiatric/Behavioral: The patient is not nervous/anxious and does not have insomnia. wear CPAP at night.   EXAMINATION Physical examination: BP 120/88    Pulse 88    Ht  (1.626 m)    Wt 188 lb (85.3 kg)    BMI 32.27 kg/m  General examination: he is alert and active in no apparent distress sitting in wheel chair. There are no dysmorphic features. Chest examination reveals normal breath sounds, and normal heart sounds with no cardiac murmur.  Abdominal examination does not show any evidence of hepatic or splenic enlargement, or any abdominal masses or bruits.  Skin evaluation does not reveal any caf-au-lait spots, hypo or hyperpigmented lesions, hemangiomas or pigmented nevi.  Neurologic examination: he is awake,  alert, cooperative. He is non-verbal.   Cranial nerves: Pupils are equal, symmetric, circular and reactive to light. He is not tracking or following objects. Exotropia in left eye.  There is no ptosis or nystagmus.   There is no facial asymmetry, with normal facial movements bilaterally.   The tongue is midline. Motor assessment: The tone is increased throughout. He moves his arms and legs with no evidence of any focal weakness. There are some arms posture with both wrists in flexion position.  Power is 5/5 in all groups of muscles across all major joints.  There is no evidence of atrophy or hypertrophy of muscles.  Deep tendon reflexes are 3+ and symmetric at the biceps, knees and ankles.  Plantar response is equivocal. Sensory examination:  withdraw to tactile stimulation.  Co-ordination and gait:   There is no evidence of tremor, dystonic posturing or any abnormal movements.   Gait was not tested. Mother reported that he walks around the house with gait abnormality.   CBC    Component Value Date/Time   WBC 5.2 11/17/2020 1051   RBC 5.43 11/17/2020 1051   HGB 15.3 11/17/2020 1051   HCT 46.4 11/17/2020 1051   PLT 209 11/17/2020 1051   MCV 85.5 11/17/2020 1051   MCH 28.2 11/17/2020 1051   MCHC 33.0 11/17/2020 1051   RDW 12.9 11/17/2020 1051   LYMPHSABS 1,139 11/17/2020 1051   MONOABS 0.7 03/18/2020 1213   EOSABS 31 11/17/2020 1051   BASOSABS 10 11/17/2020 1051    CMP     Component Value Date/Time   NA 137 11/17/2020 1051   K 4.1 11/17/2020 1051   CL 102 11/17/2020 1051   CO2 26 11/17/2020 1051   GLUCOSE 95 11/17/2020 1051   BUN 9 11/17/2020 1051   CREATININE 0.76 11/17/2020 1051   CALCIUM 9.4 11/17/2020 1051   PROT 6.5 11/17/2020 1051   ALBUMIN 4.0 03/18/2020 1213   AST 13 11/17/2020 1051   ALT 16 11/17/2020 1051   ALKPHOS 45 03/18/2020 1213   BILITOT 0.4 11/17/2020 1051   GFRNONAA >60 03/18/2020 1213   GFRAA >60 12/26/2017 1155    Assessment and Plan REBECCA MOTTA is a  30 y.o. male with history of complex past medical history of refractory epilepsy, autism spectrum disorder,  congenital quadriparesis, obstructive sleep apnea and intellectual disability. Patient was seizure free  for the past 3 months but had breakthrough seizures x 2 in December 2022. No rescue Diastat were used because they lasted 2 minutes in duration.his neurological examination is stable at baseline. Recommended to increase keppra dose at night to 2000 mg and continue moring dose with 1500 mg. Continue Vimpat ~600 mg a day as prescribed per Dr Sharene Skeans and continue Oxtellar 1800 mg a day. If has no seizure with these changes with increase keppra and VNS setting. I think we should decrease Vimpat down to 400 mg max per day.    We have Made changes in VNS setting today. Increase Autostim from 0 to 2.5 mA, decreased pulse width to 250 usec, decreased on time 30 sec and decreased duty cycle 16%. Total 5 Changes in bold.     Normal Autostim Magnet  Output 2.5 mA 2.5 mA 2.75 mA  Frequency       Pulse width 250usec 250usec 250usec  On time 30 sec 30 sec 60 sec  Off time 3 min      Duty cycle 16%          PLAN: Increase Keppra to  at night and keep morning dose  to try to prevent seizure Continue Oxtellar XR 1800 mg daily at night  Continue Vimpat 100 mg in morning, 100 mg at 3 pm and 400 mg at bedtime.  Swipe magnet twice per day after changes made to VNS during clinic visit. Continue to swipe at onset of seizure and after seizure to help with post-ictal phase Continue wearing CPAP while a sleep.  Follow-up in 3 months with Dr. Artis Flock  Counseling/Education:   Total time spent with the patient was 40 minutes, of which 50% or more was spent in counseling and coordination of care.   The plan of care was discussed, with acknowledgement of understanding expressed by his mother.   Lezlie Lye Neurology and epilepsy attending [facility] Ph.  [phone] Fax [phone]

## 2021-06-01 NOTE — Patient Instructions (Addendum)
Increase Keppra to 2000mg  at night and keep morning dose 1500mg  to try to prevent seizure Continue Oxtellar XR 1800 mg daily at night  Continue Vimpat 100 mg in morning, 100 mg at 3 pm and 400 mg at bedtime.  Swipe magnet twice per day after changes made to VNS during clinic visit. Continue to swipe at onset of seizure and after seizure to help with post-ictal phase Follow-up in 3 months with Dr. 

## 2021-06-02 MED ORDER — OXTELLAR XR 600 MG PO TB24: 3.0000 | ORAL_TABLET | Freq: Every day | ORAL | 5 refills | Status: DC

## 2021-06-02 MED ORDER — VIMPAT 200 MG PO TABS: 400.0000 mg | ORAL_TABLET | Freq: Every day | ORAL | 3 refills | Status: DC

## 2021-06-02 MED ORDER — VIMPAT 100 MG PO TABS: ORAL_TABLET | ORAL | 5 refills | Status: DC

## 2021-06-02 MED ORDER — KEPPRA 1000 MG PO TABS: ORAL_TABLET | ORAL | 5 refills | Status: DC

## 2021-06-03 ENCOUNTER — Encounter (INDEPENDENT_AMBULATORY_CARE_PROVIDER_SITE_OTHER): Payer: Self-pay | Admitting: Pediatrics

## 2021-06-08 ENCOUNTER — Ambulatory Visit (INDEPENDENT_AMBULATORY_CARE_PROVIDER_SITE_OTHER): Payer: Medicaid Other | Admitting: Dermatology

## 2021-06-08 ENCOUNTER — Other Ambulatory Visit: Payer: Self-pay

## 2021-06-08 DIAGNOSIS — L739 Follicular disorder, unspecified: Secondary | ICD-10-CM | POA: Diagnosis not present

## 2021-06-08 DIAGNOSIS — Z8614 Personal history of Methicillin resistant Staphylococcus aureus infection: Secondary | ICD-10-CM | POA: Diagnosis not present

## 2021-06-08 DIAGNOSIS — L709 Acne, unspecified: Secondary | ICD-10-CM

## 2021-06-08 MED ORDER — KETOCONAZOLE 2 % EX SHAM: 1.0000 "application " | MEDICATED_SHAMPOO | CUTANEOUS | 6 refills | Status: DC

## 2021-06-08 NOTE — Progress Notes (Signed)
° °  New Patient Visit  Subjective  Zachary [name] is a 31 y.o. male who presents for the following: bumps (Face and body, with pus, has been dealing with this while worsened past 4-10m, have tried cipro and comes back, pt scratches at, hx of MRSA, last Cipro txt ~46m ago, used Hibiclens in past, using Ketoconazole 2% shampoo 2x/wk to body).  New patient referral from Dr. [name].  Patient accompanied by his mother who contributes to history.  The following portions of the chart were reviewed this encounter and updated as appropriate:   Tobacco   Allergies   Meds   Problems   Med Hx   Surg Hx   Fam Hx      Review of Systems:  No other skin or systemic complaints except as noted in HPI or Assessment and Plan.  Objective  Well appearing patient in no apparent distress; mood and affect are within normal limits.  A focused examination was performed including face, trunk, arms. Relevant physical exam findings are noted in the Assessment and Plan.  face, trunk, arms Hyperpigmented follicular based paps face, trunk, arms   Assessment & Plan  Folliculitis and Acne face, trunk, arms Chronic and persistent  Aerobic bacterial culture performed today  Recommend Cln acne wash or Cln Sports wash 5 days a week (when not using Ketoconazole shampoo)  Cont Ketoconazole 2% shampoo 2x/wk, let sit 5 minutes before rinsing off  Discussed Isotretinoin as a treatment option.  May consider in future.  ketoconazole (NIZORAL) 2 % shampoo - face, trunk, arms Apply 1 application topically 2 (two) times a week. Wash face, scalp and body 2 times weekly, let sit 5 to 10 minutes before washing off  Related Procedures Aerobic culture  Return 2 mos.  I, Ardis Rowan, RMA, am acting as scribe for Armida Sans, MD . Documentation: I have reviewed the above documentation for accuracy and completeness, and I agree with the above.  Armida Sans, MD

## 2021-06-08 NOTE — Patient Instructions (Addendum)
If You Need Anything After Your Visit ° °If you have any questions or concerns for your doctor, please call our main line at [phone] and press option 4 to reach your doctor's medical assistant. If no one answers, please leave a voicemail as directed and we will return your call as soon as possible. Messages left after 4 pm will be answered the following business day.  ° °You may also send us a message via MyChart. We typically respond to MyChart messages within 1-2 business days. ° °For prescription refills, please ask your pharmacy to contact our office. Our fax number is [phone]. ° °If you have an urgent issue when the clinic is closed that cannot wait until the next business day, you can page your doctor at the number below.   ° °Please note that while we do our best to be available for urgent issues outside of office hours, we are not available 24/7.  ° °If you have an urgent issue and are unable to reach us, you may choose to seek medical care at your doctor's office, retail clinic, urgent care center, or emergency room. ° °If you have a medical emergency, please immediately call 911 or go to the emergency department. ° °Pager Numbers ° °- Dr. Kowalski: [phone] ° °- Dr. Moye: [phone] ° °- Dr. Stewart: [phone] ° °In the event of inclement weather, please call our main line at [phone] for an update on the status of any delays or closures. ° °Dermatology Medication Tips: °Please keep the boxes that topical medications come in in order to help keep track of the instructions about where and how to use these. Pharmacies typically print the medication instructions only on the boxes and not directly on the medication tubes.  ° °If your medication is too expensive, please contact our office at [phone] option 4 or send us a message through MyChart.  ° °We are unable to tell what your co-pay for medications will be in advance as this is different depending on your insurance coverage.  However, we may be able to find a substitute medication at lower cost or fill out paperwork to get insurance to cover a needed medication.  ° °If a prior authorization is required to get your medication covered by your insurance company, please allow us 1-2 business days to complete this process. ° °Drug prices often vary depending on where the prescription is filled and some pharmacies may offer cheaper prices. ° °The website www.goodrx.com contains coupons for medications through different pharmacies. The prices here do not account for what the cost may be with help from insurance (it may be cheaper with your insurance), but the website can give you the price if you did not use any insurance.  °- You can print the associated coupon and take it with your prescription to the pharmacy.  °- You may also stop by our office during regular business hours and pick up a GoodRx coupon card.  °- If you need your prescription sent electronically to a different pharmacy, notify our office through [facility] MyChart or by phone at [phone] option 4. ° ° ° ° °Si Usted Necesita Algo Después de Su Visita ° °También puede enviarnos un mensaje a través de MyChart. Por lo general respondemos a los mensajes de MyChart en el transcurso de 1 a 2 días hábiles. ° °Para renovar recetas, por favor pida a su farmacia que se ponga en contacto con nuestra oficina. Nuestro número de fax es el [phone]. ° °Si tiene   un asunto urgente cuando la clnica est cerrada y que no puede esperar hasta el siguiente da hbil, puede llamar/localizar a su doctor(a) al nmero que aparece a continuacin.   Por favor, tenga en cuenta que aunque hacemos todo lo posible para estar disponibles para asuntos urgentes fuera del horario de [name], no estamos disponibles las 24 horas del da, los 7 [address]  [address] de la [place].   Si tiene un problema urgente y no puede comunicarse con nosotros, puede optar por buscar atencin mdica  en el consultorio de su  doctor(a), en una clnica privada, en un centro de atencin urgente o en una sala de emergencias.  Si tiene Engineer, drilling, por favor llame inmediatamente al 911 o vaya a la sala de emergencias.  Nmeros de bper  - Dr. Gwen Pounds: [phone]  - Dra. Moye: [phone]  - Dra. Roseanne Reno: [phone]  En caso de inclemencias del [place], por favor llame a Lacy Duverney principal al [phone] para una actualizacin sobre el [place] de cualquier retraso o cierre.  Consejos para la medicacin en dermatologa: Por favor, guarde las cajas en las que vienen los medicamentos de uso tpico para ayudarle a seguir las instrucciones sobre dnde y cmo usarlos. Las farmacias generalmente imprimen las instrucciones del medicamento slo en las cajas y no directamente en los tubos del [place].   Si su medicamento es muy caro, por favor, pngase en contacto con Rolm Gala llamando al [phone] y presione la opcin 4 o envenos un mensaje a travs de Clinical cytogeneticist.   No podemos decirle cul ser su copago por los medicamentos por adelantado ya que esto es diferente dependiendo de la cobertura de su seguro. Sin embargo, es posible que podamos encontrar un medicamento sustituto a Audiological scientist un formulario para que el seguro cubra el medicamento que se considera necesario.   Si se requiere una autorizacin previa para que su compaa de seguros [place] su medicamento, por favor permtanos de 1 a 2 das hbiles para completar [address]  Los precios de los medicamentos varan con frecuencia dependiendo del Environmental consultant de dnde se surte la receta y alguna farmacias pueden ofrecer precios ms baratos.  El sitio web www.goodrx.com tiene cupones para medicamentos de Health and safety inspector. Los precios aqu no tienen en cuenta lo que podra costar con la ayuda del seguro (puede ser ms barato con su seguro), pero el sitio web puede darle el precio si no utiliz Tourist information centre manager.  - Puede imprimir el cupn  correspondiente y llevarlo con su receta a la farmacia.  - Tambin puede pasar por nuestra oficina durante el horario de atencin regular y Education officer, museum una tarjeta de cupones de GoodRx.  - Si necesita que su receta se enve electrnicamente a una farmacia diferente, informe a nuestra oficina a travs de MyChart de [place] o por telfono llamando al [phone] y presione la opcin 4.   Cln Acne wash or Sports wash use on days not using the Ketoconazole 2% shampoo, let sit 5-10 minutes and rinse off. (5 days a week)

## 2021-06-11 LAB — AEROBIC CULTURE

## 2021-06-12 ENCOUNTER — Telehealth: Payer: Self-pay

## 2021-06-12 ENCOUNTER — Encounter: Payer: Self-pay | Admitting: Dermatology

## 2021-06-12 NOTE — Telephone Encounter (Signed)
Left message for patient's guardian to call regarding culture results/hd

## 2021-06-12 NOTE — Telephone Encounter (Signed)
-----   Message from Deirdre Evener, MD sent at 06/12/2021  9:25 AM EST ----- No abnormal bacterial growth for skin culture performed January 2023. Continue recommended treatment / washes.

## 2021-06-21 ENCOUNTER — Telehealth: Payer: Self-pay

## 2021-06-21 NOTE — Telephone Encounter (Signed)
-----   Message from David C Kowalski, MD sent at 06/12/2021  9:25 AM EST ----- °No abnormal bacterial growth for skin culture performed January 2023. °Continue recommended treatment / washes. °

## 2021-06-21 NOTE — Telephone Encounter (Signed)
Patient's mother Archie Patten informed of culture results and to continue treatment. She states that area looks so much better.

## 2021-07-12 ENCOUNTER — Other Ambulatory Visit: Payer: Self-pay | Admitting: Pediatrics

## 2021-07-19 ENCOUNTER — Encounter (INDEPENDENT_AMBULATORY_CARE_PROVIDER_SITE_OTHER): Payer: Self-pay | Admitting: Pediatrics

## 2021-07-27 ENCOUNTER — Ambulatory Visit (INDEPENDENT_AMBULATORY_CARE_PROVIDER_SITE_OTHER): Payer: Medicaid Other | Admitting: Pediatrics

## 2021-07-27 ENCOUNTER — Other Ambulatory Visit: Payer: Self-pay

## 2021-07-27 DIAGNOSIS — G808 Other cerebral palsy: Secondary | ICD-10-CM

## 2021-07-27 DIAGNOSIS — K0889 Other specified disorders of teeth and supporting structures: Secondary | ICD-10-CM

## 2021-07-27 DIAGNOSIS — G40319 Generalized idiopathic epilepsy and epileptic syndromes, intractable, without status epilepticus: Secondary | ICD-10-CM

## 2021-07-29 ENCOUNTER — Encounter: Payer: Self-pay | Admitting: Pediatrics

## 2021-07-29 DIAGNOSIS — K0889 Other specified disorders of teeth and supporting structures: Secondary | ICD-10-CM | POA: Insufficient documentation

## 2021-07-29 NOTE — Progress Notes (Signed)
Subjective   Zachary [name], 31 y.o. male, with cerebral palsy and seizures presents with  hitting at ears and this usually represents ear infection. He does have dental caries and has a dental follow up next week .  Symptoms started 3 days ago.  He is taking fluids well.  There are no other significant complaints.  The patient's history has been marked as reviewed and updated as appropriate.  Objective   There were no vitals taken for this visit.  General appearance:  well developed and well nourished and well hydrated  Nasal: Neck:  Mild nasal congestion with clear rhinorrhea Neck is supple  Ears:  External ears are normal Right TM - normal landmarks and mobility Left TM - normal landmarks and mobility  Oropharynx:  Mucous membranes are moist  Lungs:  Lungs are clear to auscultation  Heart:  Regular rate and rhythm; no murmurs or rubs  Skin:  No rashes or lesions noted   Assessment   Otalgia secondary to dental caries  Plan   1) Paperwork for dental clearance filled out  2) Fluids, acetaminophen as needed 3) Recheck if symptoms persist for 2 or more days, symptoms worsen, or new symptoms develop.

## 2021-07-29 NOTE — Patient Instructions (Signed)
Benzocaine mouth gel, ointment, solution, or dental paste ?What is this medication? ?BENZOCAINE (BEN zoe kane) causes loss of feeling on the skin and in the mouth. This helps relieve mouth pain. ?This medicine may be used for other purposes; ask your health care provider or pharmacist if you have questions. ?COMMON BRAND NAME(S): Anbesol, Anbesol Baby, Anbesol Jr, Benz-O-Sthetic, Benzodent, Boil-Ease, Comfort Caine, Dry Socket Remedy, Freez Eez, Little Remedies for Teethers, Orabase, Orajel, Orajel Baby, Orajel Denture Plus, Orajel Maximum Strength, Orajel Protective, Orajel Severe Pain, Orajel Swabs, Oral Pain Relief, Pro-Caine, Topicale Xtra, Zilactin-B ?What should I tell my care team before I take this medication? ?They need to know if you have any of these conditions: ?cigarette smoker ?G6PD deficiency ?heart disease ?lung or breathing disease, like asthma ?mouth sores or infection ?an unusual or allergic reaction to benzocaine, para-aminobenzoic acid (PABA), other medicines, foods, dyes, or preservatives ?pregnant or trying to get pregnant ?breast-feeding ?How should I use this medication? ?This medicine is applied to the affected area of the mouth or gums. Wash your hands before and after using this medicine. Follow the directions on the label or those given to you by your doctor or health care professional. Do not use this medicine more often than directed. ?Talk to your pediatrician regarding the use of this medicine in children. While this medicine may be used in children as young as 2 years for selected conditions, precautions do apply. This medicine should not be used for teething pain. ?Overdosage: If you think you have taken too much of this medicine contact a poison control center or emergency room at once. ?NOTE: This medicine is only for you. Do not share this medicine with others. ?What if I miss a dose? ?This does not apply. ?What may interact with this medication? ?sulfonamides like  sulfacetamide, sulfamethoxazole, sulfisoxazole, and others ?This list may not describe all possible interactions. Give your health care provider a list of all the medicines, herbs, non-prescription drugs, or dietary supplements you use. Also tell them if you smoke, drink alcohol, or use illegal drugs. Some items may interact with your medicine. ?What should I watch for while using this medication? ?This medicine is not for long-term use. Do not use for longer than directed on the label or your doctor or health care professional. Do not use on large areas of broken or damaged skin. Contact your doctor or health care professional if your condition does not start to get better within a few days or if you notice redness, itching or swelling. ?The affected area of your mouth will be numb. Try to avoid injury to that area. To avoid biting the tongue or cheek, or difficulty swallowing, do not chew gum or food until the numbness wears off. ?What side effects may I notice from receiving this medication? ?Side effects that you should report to your doctor or health care professional as soon as possible: ?allergic reactions like skin rash, itching or hives, swelling of the face, lips, or tongue ?breathing problems ?confusion ?dizziness ?fast, irregular heartbeat ?headache ?pale, gray, or blue-colored skin, lips, or nail beds ?seizures ?tiredness ?tremor ?Side effects that usually do not require medical attention (report to your doctor or health care professional if they continue or are bothersome): ?redness, swelling, or pain ?This list may not describe all possible side effects. Call your doctor for medical advice about side effects. You may report side effects to FDA at 1-800-FDA-1088. ?Where should I keep my medication? ?Keep out of the reach of children. ?Store at   room temperature between 15 and 30 degrees C (59 and 86 degrees F). Do not freeze. Throw away any unused medicine after the expiration date. ?NOTE: This sheet is  a summary. It may not cover all possible information. If you have questions about this medicine, talk to your doctor, pharmacist, or health care provider. ?? 2022 Elsevier/Gold Standard (2016-10-25 00:00:00) ? ?

## 2021-08-09 ENCOUNTER — Encounter: Payer: Self-pay | Admitting: Pediatrics

## 2021-08-09 MED ORDER — SULFAMETHOXAZOLE-TRIMETHOPRIM 800-160 MG PO TABS: 1.0000 | ORAL_TABLET | Freq: Two times a day (BID) | ORAL | 0 refills | Status: AC

## 2021-08-30 ENCOUNTER — Ambulatory Visit: Payer: Medicaid Other | Admitting: Dermatology

## 2021-08-30 ENCOUNTER — Ambulatory Visit (INDEPENDENT_AMBULATORY_CARE_PROVIDER_SITE_OTHER): Payer: Medicaid Other | Admitting: Dermatology

## 2021-08-30 ENCOUNTER — Other Ambulatory Visit: Payer: Self-pay

## 2021-08-30 DIAGNOSIS — L739 Follicular disorder, unspecified: Secondary | ICD-10-CM | POA: Diagnosis not present

## 2021-08-30 DIAGNOSIS — L738 Other specified follicular disorders: Secondary | ICD-10-CM | POA: Diagnosis not present

## 2021-08-30 DIAGNOSIS — L700 Acne vulgaris: Secondary | ICD-10-CM

## 2021-08-30 MED ORDER — KETOCONAZOLE 2 % EX SHAM: 1.0000 "application " | MEDICATED_SHAMPOO | CUTANEOUS | 6 refills | Status: DC

## 2021-08-30 MED ORDER — DOXYCYCLINE HYCLATE 100 MG PO TABS: 100.0000 mg | ORAL_TABLET | Freq: Two times a day (BID) | ORAL | 3 refills | Status: DC

## 2021-08-30 NOTE — Progress Notes (Signed)
? ?  Follow-Up Visit ?  ?Subjective  ?Zachary [name] is a 31 y.o. male who presents for the following: Follow-up (Folliculitis/Acne follow up - a little better but still there. Treating with CLN wash 5 times per week and Ketoconazole 2% shampoo 2 times per week).  The mother states he has periodic severe flares ?Accompanied by mother who contributes to history.  The patient is non-communicative. ? ?The following portions of the chart were reviewed this encounter and updated as appropriate:  ? Tobacco  Allergies  Meds  Problems  Med Hx  Surg Hx  Fam Hx   ?  ?Review of Systems:  No other skin or systemic complaints except as noted in HPI or Assessment and Plan. ? ?Objective  ?Well appearing patient in no apparent distress; mood and affect are within normal limits. ? ?A focused examination was performed including face, back arms. Relevant physical exam findings are noted in the Assessment and Plan. ? ?Face, trunk, arms ?Comedones scattered over face, arms and trunk ? ? 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 Condition is symptomatic / bothersome to patient. Not to goal. ?Start Doxycycline 100 mg 1 po bid x 7 days with each flare. Take with food and plenty of fluid. ?Continue Ketoconazole 2% shampoo 2 times per week,  ?CLN wash 5 times per week ? ?doxycycline (VIBRA-TABS) 100 MG tablet - Face, trunk, arms ?Take 1 tablet (100 mg total) by mouth 2 (two) times daily. X 7 days for each of acne. Take with food and plenty of fluid ? ?Related Medications ?ketoconazole (NIZORAL) 2 % shampoo ?Apply 1 application. topically 2 (two) times a week. Wash face, scalp and body 2 times weekly, let sit 5 to 10 minutes before washing off ? ?Return in about 6 months (around 03/02/2022). ? ?I, Joanie Coddington, CMA, am acting as scribe for Armida Sans, MD  . ?Documentation: I have reviewed the above documentation for accuracy and completeness, and I agree with the above. ? ?Armida Sans, MD ? ?

## 2021-08-30 NOTE — Patient Instructions (Signed)

## 2021-08-31 ENCOUNTER — Encounter: Payer: Self-pay | Admitting: Dermatology

## 2021-09-04 ENCOUNTER — Ambulatory Visit (INDEPENDENT_AMBULATORY_CARE_PROVIDER_SITE_OTHER): Payer: Medicaid Other | Admitting: Pediatrics

## 2021-09-08 NOTE — Progress Notes (Signed)
? ?Patient: Zachary [name] MRN: [id number] ?Sex: male DOB: Apr 12, 1991 ? ?Provider: [name], MD ?Location of Care: [facility] ? ?Note type: New Patient ? ?History of Present Illness: ?Referral Source:Zachary Abdelmoumen, MD  ?History from: patient and prior records ?Chief Complaint: epilepsy  ? ?Zachary [name] is a 31 y.o. male with history of focal epilepsy s/p VNS insertion, autism spectrum disorder, congenital quadriparesis, obstructive sleep apnea, and intellectual disability who was previously seen by Dr. Moody [name] by the request of Dr. Sharene [name] for continued care on concern of seizure management. Review of prior history shows patient was last seen by his Dr. Moody [name] on 06/01/21 where Keppra was increased and autostmimulation from the VNS was increased while Vimpat and Oxtellar were continued. Plan to decrease Vimpat if this new regimen keeps him seizure free. Since then mom reached out on 07/19/21 to report continued seizures.  ? ?Patient presents today with mother who reports that he has had only one major seizure since the last visit with Zachary [name]. This seizure was typical for him, where he is sleeping and as he is waking he will have a GTC with generalized body shaking.  ? ?Previously his seizures would previously have happened 3-4 times a month, so it seems like this is an improvement, particularly with the smaller focal seizures.  ? ?Has started giving the increased Keppra and continues with Vimpat and Oxtellar. Has not had to use Ativan recently.  No side effects with the medication, however she has noticed he will hit his neck and has had some of a cough. Wonders if we can decrease the intensity with the pulse.  ? ?Mom reports he can walk but they use the wheelchair when going out as he can fall with seizures.  ? ?Has not tolerated the CPAP machine recommended by the sleep specialists however, she would be interested in trying to get new ones today.  ? ?He [facility]ends  [facility] [employer], which has been good, but there have been staffing changes and this has been worsening his mood. In general, change can be hard on him.  ? ?Epilepsy/seizure History: (copied from previous records) ?  ?Age at seizure onset: 31 years of age  ?Description of all seizure types and duration: ?Semiology #1: Generalization tonic-clonic lasting up to 2 minutes in duration.  ?Semiology #2: Focal seizures with hiccup noises, slight tremble of hand, and unresponsiveness that stop with VNS magnet activation. ?  ?Complications from seizures (trauma, etc.): He has broken his left shoulder from seizure.  ?h/o status epilepticus: No ?  ?Date of most recent seizure: 05/29/2021  ?  ?Seizure frequency past month (exact number or average per day): 2 ?Past 3 months: 2 ?  ?Current AEDs and Current side effects: ?Keppra 1500 mg BID ?Oxtellar XR 1800 mg daily at night  ?Vimpat 100 mg in morning, 100 mg at 3 pm and 400 mg at bedtime.  ?  ?Prior AEDs (d/c reason?):  ?Topamax discontinued due to weight loss ?Depakote discontinued due to low platelets  ?Pregabalin discontinued due to sleepiness  ? ?Past Medical History ?Past Medical History:  ?Diagnosis Date  ? 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 Diagnosed on [birth year]48 upon referral by Dr. Theressa Stamps. Patient's AHI was 39.6 RDI is 43.6 positional component. Patient will need desensitization and a gentle approach to CPAP titration.   ? CP (cerebral palsy) (HCC)   ? Fracture of femur, intertrochanteric, closed (HCC)   ? Hypertension   ? MR (mental retardation)   ?  Recurrent apnea   ? Seizures (HCC)   ? 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 Placed in 05/23/11, Dr Zachary SkeansHickling follows the settings and his seizure disorder. .   ? ? ?Surgical History ?Past Surgical History:  ?Procedure Laterality Date  ? FOOT SURGERY    ? FRACTURE SURGERY    ? HIP SURGERY    ? IMPLANTATION VAGAL NERVE STIMULATOR    ? Pediatomy  2009  ? TONSILLECTOMY  2009  ? TYMPANOSTOMY TUBE  PLACEMENT  2009  ? ? ?Family History ?family history includes Cancer in his paternal grandmother; Diabetes in his paternal grandfather. ? ? ?Social History ?Social History  ? ?Social History Narrative  ? 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  ? He lives with his mother, maternal grandmother, and mom has a client who lives in the home as an AFL   ? He enjoys swimming and going to the beach.  ? ? ?Allergies ?Allergies  ?Allergen Reactions  ? Depakote [Divalproex Sodium] Other (See Comments)  ?  Low Platelets  ? Lyrica [Pregabalin] Other (See Comments)  ?  Sleepiness  ? Penicillins Hives and Rash  ?  Has patient had a PCN reaction causing immediate rash, facial/tongue/throat swelling, SOB or lightheadedness with hypotension: Unknown ?Has patient had a PCN reaction causing severe rash involving mucus membranes or skin necrosis: Yes ?Has patient had a PCN reaction that required hospitalization: No ?Has patient had a PCN reaction occurring within the last 10 years: No ?If all of the above answers are "NO", then may proceed with Cephalosporin use. ?  ? Topamax [Topiramate] Other (See Comments)  ?  Weight Loss  ? ? ?Medications ?Current Outpatient Medications on File Prior to Visit  ?Medication Sig Dispense Refill  ? acetaminophen (TYLENOL) 500 MG tablet Take 500 mg by mouth every 6 (six) hours as needed for mild pain, moderate pain, fever or headache.    ? albuterol (PROVENTIL) (2.5 MG/3ML) 0.083% nebulizer solution Take 3 mLs (2.5 mg total) by nebulization every 6 (six) hours as needed for wheezing or shortness of breath. 75 mL 12  ? chlorhexidine (HIBICLENS) 4 % external liquid Apply topically daily as needed. 120 mL 0  ? cholecalciferol (VITAMIN D) 1000 units tablet Take 1,000 Units by mouth daily.    ? CIPRODEX OTIC suspension PLACE 4 DROPS IN LEFT EAR TWICE DAILY FOR 7 DAYS 7.5 mL 12  ? DERMA-SMOOTHE/FS BODY 0.01 % OIL APPLY DAILY 118.28 mL 12  ? Docusate Sodium (DSS) 100 MG CAPS Take 2 capsules by mouth daily.    ?  KEPPRA 1000 MG tablet Take 1-1/2 tablets in the morning and 2 tablets at night. 108 tablet 5  ? ketoconazole (NIZORAL) 2 % shampoo Apply 1 application. topically 2 (two) times a week. Wash face, scalp and body 2 times weekly, let sit 5 to 10 minutes before washing off 120 mL 6  ? OXTELLAR XR 600 MG TB24 Take 3 tablets by mouth at bedtime. 93 tablet 5  ? polyethylene glycol (MIRALAX / GLYCOLAX) 17 g packet Take 1 Container by mouth daily as needed.    ? spironolactone (ALDACTONE) 50 MG tablet Take 100 mg by mouth daily.    ? VIMPAT 100 MG TABS TAKE ONE TABLET IN THE MORNING AND TAKE ONE TABLET AT 3PM 62 tablet 5  ? VIMPAT 200 MG TABS tablet Take 2 tablets (400 mg total) by mouth at bedtime. 62 tablet 3  ? ?No current facility-administered medications on file prior  to visit.  ? ?The medication list was reviewed and reconciled. All changes or newly prescribed medications were explained.  A complete medication list was provided to the patient/caregiver. ? ?Physical Exam ?BP 124/70   Pulse 88   Wt 187 lb (84.8 kg)   BMI 32.10 kg/m?  ?Facility age limit for growth percentiles is 20 years.  ?No results found. ?Gen: well appearing neuroaffected man ?Skin: No rash, No neurocutaneous stigmata. ?HEENT: Normocephalic, no dysmorphic features, no conjunctival injection, nares patent, mucous membranes moist, oropharynx clear.  ?Neck: Supple, no meningismus. No focal tenderness. ?Resp: Clear to auscultation bilaterally ?CV: Regular rate, normal S1/S2, no murmurs, no rubs ?Abd: BS present, abdomen soft, non-tender, non-distended. No hepatosplenomegaly or mass ?Ext: Warm and well-perfused. No deformities, no muscle wasting, ROM full. ? ?Neurological Examination: ?MS: Awake, alert.  Nonverbal, but interactive, reacts appropriately to conversation.   ?Cranial Nerves: Pupils were equal and reactive to light;  No clear visual field defect, no nystagmus; no ptsosis, face symmetric with full strength of facial muscles, hearing grossly  intact, palate elevation is symmetric. ?Motor-Low core tone, increased extremity tone.Moves extremities at least antigravity. No abnormal movements ?Reflexes- Reflexes 2+ and symmetric in the biceps, triceps, p

## 2021-09-14 ENCOUNTER — Encounter (INDEPENDENT_AMBULATORY_CARE_PROVIDER_SITE_OTHER): Payer: Self-pay | Admitting: Pediatrics

## 2021-09-14 ENCOUNTER — Ambulatory Visit (INDEPENDENT_AMBULATORY_CARE_PROVIDER_SITE_OTHER): Payer: Medicaid Other | Admitting: Pediatrics

## 2021-09-14 VITALS — BP 124/70 | HR 88 | Wt 187.0 lb

## 2021-09-14 DIAGNOSIS — R451 Restlessness and agitation: Secondary | ICD-10-CM | POA: Diagnosis not present

## 2021-09-14 DIAGNOSIS — Z9689 Presence of other specified functional implants: Secondary | ICD-10-CM

## 2021-09-14 DIAGNOSIS — G40319 Generalized idiopathic epilepsy and epileptic syndromes, intractable, without status epilepticus: Secondary | ICD-10-CM

## 2021-09-14 DIAGNOSIS — G4733 Obstructive sleep apnea (adult) (pediatric): Secondary | ICD-10-CM

## 2021-09-14 DIAGNOSIS — G40209 Localization-related (focal) (partial) symptomatic epilepsy and epileptic syndromes with complex partial seizures, not intractable, without status epilepticus: Secondary | ICD-10-CM

## 2021-09-14 DIAGNOSIS — G808 Other cerebral palsy: Secondary | ICD-10-CM

## 2021-09-14 MED ORDER — LORAZEPAM 2 MG PO TABS: ORAL_TABLET | ORAL | 5 refills | Status: DC

## 2021-09-14 MED ORDER — VALTOCO 20 MG DOSE 10 MG/0.1ML NA LQPK: 20.0000 mg | NASAL | 2 refills | Status: DC | PRN

## 2021-09-14 NOTE — Patient Instructions (Addendum)
Updated his VNS settings today, making the autostimulation more sensitive and decreasing the strength of the vibrations.  ?Decrease his Vimpat in two weeks (09/28/21) to 100 mg in the morning and afternoon and 300 mg (1.5 tablets at night).  ?Two weeks after that (10/12/21) decrease Vimpat more to 100 mg in the morning and afternoon and 200 mg (1 tablets at night).  ?New prescription of Valtoco emergency medication sent today. ?[facility]erral to [facility] to evaluate him for new treatments of his sleep apnea. ?

## 2021-09-28 ENCOUNTER — Encounter (INDEPENDENT_AMBULATORY_CARE_PROVIDER_SITE_OTHER): Payer: Self-pay | Admitting: Pediatrics

## 2021-10-25 ENCOUNTER — Encounter (INDEPENDENT_AMBULATORY_CARE_PROVIDER_SITE_OTHER): Payer: Self-pay | Admitting: Pediatrics

## 2021-10-25 DIAGNOSIS — G40209 Localization-related (focal) (partial) symptomatic epilepsy and epileptic syndromes with complex partial seizures, not intractable, without status epilepticus: Secondary | ICD-10-CM

## 2021-10-26 MED ORDER — VIMPAT 100 MG PO TABS: ORAL_TABLET | ORAL | 5 refills | Status: DC

## 2021-11-06 ENCOUNTER — Telehealth (INDEPENDENT_AMBULATORY_CARE_PROVIDER_SITE_OTHER): Payer: Self-pay | Admitting: Pediatrics

## 2021-11-06 ENCOUNTER — Telehealth: Payer: Self-pay | Admitting: Pediatrics

## 2021-11-06 ENCOUNTER — Encounter (INDEPENDENT_AMBULATORY_CARE_PROVIDER_SITE_OTHER): Payer: Self-pay

## 2021-11-06 ENCOUNTER — Other Ambulatory Visit: Payer: Self-pay | Admitting: Pediatrics

## 2021-11-06 NOTE — Telephone Encounter (Signed)
Mother called stating a prescription is needing update. Mother is currently working with a new nurse in the home and if the wording is not correct on a prescription they will do what the prescription states. Mother states patient uses Derma-smoothe 0.01% oil. The wording states apply daily but needs to state apply daily as needed.

## 2021-11-06 NOTE — Telephone Encounter (Signed)
  Name of who is calling:[name]   Caller's Relationship to Patient:Mo[place]   Best contact [place]  Provider they see:Dr. Rogers Blocker   Reason for call:pharmacy stated that they need the new prescription for the [place]  Name of prescription:VIMPAT   [facility]

## 2021-11-07 MED ORDER — FLUOCINOLONE ACETONIDE BODY 0.01 % EX OIL: 1.0000 "application " | TOPICAL_OIL | Freq: Every day | CUTANEOUS | 12 refills | Status: AC | PRN

## 2021-11-07 NOTE — Telephone Encounter (Signed)
Changed to apply daily as needed

## 2021-11-14 ENCOUNTER — Encounter (INDEPENDENT_AMBULATORY_CARE_PROVIDER_SITE_OTHER): Payer: Self-pay | Admitting: Pediatrics

## 2021-11-14 DIAGNOSIS — G40209 Localization-related (focal) (partial) symptomatic epilepsy and epileptic syndromes with complex partial seizures, not intractable, without status epilepticus: Secondary | ICD-10-CM

## 2021-11-22 ENCOUNTER — Telehealth (INDEPENDENT_AMBULATORY_CARE_PROVIDER_SITE_OTHER): Payer: Self-pay | Admitting: Pediatrics

## 2021-11-22 NOTE — Telephone Encounter (Signed)
Spoke with mom and she informs that she is going under shoulder surgery tomorrow. While mom is healing from the surgery he will have in home nursing, and the medication list on MyChart is how they are going to dispense his medications.   Mom requests that the following be made as it is how he is currently taking his medications.     Vimpat 200 mg QHS needs to be removed from the patients medication list.   Vimpat 100mg  1 tablet AM  Vimpat 100mg  1 tablet afternoon Vimpat 100mg  2tablet QHS

## 2021-11-22 NOTE — Telephone Encounter (Signed)
Left voicemail for mom to call back

## 2021-11-22 NOTE — Telephone Encounter (Signed)
  Name of who is calling:Veselka,[name]  Caller's Relationship to Patient:mom   Best contact number: [phone]  Provider they see:Raylene Everts  Reason for call: Mom needs to discuss medication  VIMPAT 100 MG TABS &VIMPAT 200 MG TABS tablet       PRESCRIPTION REFILL ONLY  Name of prescription:  Pharmacy:

## 2021-11-22 NOTE — Telephone Encounter (Signed)
Done. TG 

## 2021-11-23 MED ORDER — VIMPAT 100 MG PO TABS: ORAL_TABLET | ORAL | 5 refills | Status: DC

## 2021-11-23 NOTE — Addendum Note (Signed)
Addended by: Margurite Auerbach on: 11/23/2021 07:12 PM   Modules accepted: Orders

## 2021-12-13 NOTE — Progress Notes (Signed)
Patient: Zachary [name] MRN: [id number] Sex: male DOB: 1990/10/10  Provider: [name], MD Location of Care: [facility]  Note type: Routine follow-up  History of Present Illness:  Zachary [name] is a 31 y.o. male with history of focal epilepsy s/p VNS insertion, autism spectrum disorder, congenital quadriparesis, obstructive sleep apnea, and intellectual disability who I am seeing for routine follow-up. Patient was last seen on 09/14/21 where I decreased Vimpat and continued Keppra as well as updated his VNS settings.  Since the last appointment, mom called on 11/21/21 to report increase in seizures two weeks after decreasing Vimpat, and I increased back to 200 mg in the morning and night with 100 mg in the afternoon.   Patient presents today with mom who reports she has continues to give Vimpat 100 mg in the morning and afternoon and 200 mg at night. She did not increase the medication, as she thinks the increase in seizures in June was related to disrupted sleep. Sleep was disrupted with changes in routine at school, however, once this was addressed seizures decreased. Since improved sleep, he has only had one seizure, however, it stopped with one magnet swipe. Mom would prefer to continue on the lower dosage with as few pills as possible.   Continues Keppra and Oxtellar as well, no problems.      Mom is still interested in trying to see [facility] for his sleep apnea. Provider at [facility] was not helpful in addressing lack of compliance with cpap.   Recently, he has been constipated. To address this she is giving the stool softener every day, with a stimulant every other day, as well as two capfuls of miralax every other day.   Epilepsy/seizure History: (copied from previous records)   Age at seizure onset: 31 years of age  Description of all seizure types and duration: Semiology #1: Generalization tonic-clonic lasting up to 2 minutes in duration.  Semiology  #2: Focal seizures with hiccup noises, slight tremble of hand, and unresponsiveness that stop with VNS magnet activation.   Complications from seizures (trauma, etc.): He has broken his left shoulder from seizure.  h/o status epilepticus: No   Date of most recent seizure: 05/29/2021    Seizure frequency past month (exact number or average per day): 2 Past 3 months: 2   Current AEDs and Current side effects: Keppra 1500 mg BID Oxtellar XR 1800 mg daily at night  Vimpat 100 mg in morning, 100 mg at 3 pm and 400 mg at bedtime.    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 Theressa Stamps. Patient's AHI was 39.6 RDI is 43.6 positional component. Patient will need desensitization and a gentle approach to CPAP titration.    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 .     Surgical History Past Surgical History:  Procedure Laterality Date   FOOT SURGERY     FRACTURE SURGERY     HIP  SURGERY     IMPLANTATION VAGAL NERVE STIMULATOR     Pediatomy  2009   TONSILLECTOMY  2009   TYMPANOSTOMY TUBE PLACEMENT  2009    Family History family history includes Cancer in his paternal grandmother; Diabetes in his paternal grandfather.   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   He lives with his mother, maternal grandmother, and mom has a client who lives in the home as an AFL    He enjoys swimming and going to the beach.     Allergies Allergies  Allergen Reactions   Depakote [Divalproex Sodium] Other (See Comments)    Low Platelets   Lyrica [Pregabalin] Other (See Comments)    Sleepiness   Penicillins Hives and Rash    Has patient had a PCN reaction causing immediate rash, facial/tongue/throat swelling, SOB or lightheadedness with hypotension: Unknown Has patient had a PCN reaction causing severe rash involving mucus membranes or skin necrosis: Yes Has patient had a PCN reaction that required hospitalization: No Has patient had a PCN reaction occurring within the last 10 years: No If all of the above answers are "NO", then may proceed with Cephalosporin use.    Topamax [Topiramate] Other (See Comments)    Weight Loss    Medications Current Outpatient Medications on File Prior to Visit  Medication Sig Dispense Refill   chlorhexidine (HIBICLENS) 4 % external liquid Apply topically daily as needed. 120 mL 0   ciprofloxacin (CILOXAN) 0.3 % ophthalmic solution 5 drops to right ear twice daily     Docusate Sodium (DSS) 100 MG CAPS Take 2 capsules by mouth daily.     ketoconazole (NIZORAL) 2 % shampoo Apply 1 application. topically 2 (two) times a week. Wash face, scalp and body 2 times weekly, let sit 5 to 10 minutes before washing off 120 mL 6   polyethylene glycol (MIRALAX / GLYCOLAX) 17 g packet Take 1 Container by mouth daily as needed.     spironolactone (ALDACTONE) 50 MG tablet Take 100 mg by mouth daily.     acetaminophen (TYLENOL) 500 MG tablet Take 500 mg by mouth every 6 (six) hours as needed for mild pain, moderate pain, fever or headache. (Patient not taking: Reported on 12/18/2021)     albuterol (PROVENTIL) (2.5 MG/3ML) 0.083% nebulizer solution Take 3 mLs (2.5 mg total) by nebulization every 6 (six) hours as needed for wheezing or shortness of breath. (Patient not taking: Reported on 12/18/2021) 75 mL 12   budesonide (PULMICORT) 0.5 MG/2ML nebulizer solution Inhale into the lungs. (Patient not  taking: Reported on 12/18/2021)     diazePAM, 20 MG Dose, (VALTOCO 20 MG DOSE) 2 x 10 MG/0.1ML LQPK Place 20 mg into the nose as needed (seizure lasting longer than 5 minutes). (Patient not taking: Reported on 12/18/2021) 2 each 2   LORazepam (ATIVAN) 2 MG tablet Take 1 tablet by mouth as needed for agitation. (Patient not taking: Reported on 12/18/2021) 30 tablet 5   No current facility-administered medications on file prior to visit.   The medication list was reviewed and reconciled. All changes or newly prescribed medications were explained.  A complete medication list was provided to the patient/caregiver.  Physical Exam BP 122/70   Pulse 88   Wt 177 lb 12.8 oz (80.6 kg)   BMI 30.52 kg/m  Facility age limit for growth %iles is 20 years.  No results found. Gen: well appearing neuroaffected adult Skin: No rash,  No neurocutaneous stigmata. HEENT: Normocephalic, no dysmorphic features, no conjunctival injection, nares patent, mucous membranes moist, oropharynx clear.  Neck: Supple, no meningismus. No focal tenderness. Resp: Clear to auscultation bilaterally CV: Regular rate, normal S1/S2, no murmurs, no rubs Abd: BS present, abdomen soft, non-tender, non-distended. No hepatosplenomegaly or mass Ext: Warm and well-perfused. No deformities, no muscle wasting, ROM full.  Neurological Examination: MS: Awake, alert.  Nonverbal, but interactive, reacts appropriately to conversation.   Cranial Nerves: Pupils were equal and reactive to light;  No clear visual field defect, no nystagmus; no ptsosis, face symmetric with full strength of facial muscles, hearing grossly intact, palate elevation is symmetric. Motor-Fairly normal tone throughout, moves extremities at least antigravity. No abnormal movements Reflexes- Reflexes 2+ and symmetric in the biceps, triceps, patellar and achilles tendon. Plantar responses flexor bilaterally, no clonus noted Sensation: Responds to touch in all extremities.   Coordination: Does not reach for objects.  Gait: wheelchair dependent, poor head control.      Diagnosis: 1. 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 Autism spectrum disorder with accompanying intellectual impairment, requiring very substantial support (level 3)   3. S/P placement of VNS (vagus nerve stimulation) device   4. Obstructive sleep apnea      Assessment and Plan Zachary [name] is a 31 y.o. male with history of focal epilepsy s/p VNS insertion, autism spectrum disorder, congenital quadriparesis, obstructive sleep apnea, and intellectual disability who I am seeing in follow-up. He continues to have occasional seizures, often related to poor sleep. With regimented sleep seizures are relatively controlled with current dosing of medication and VNS, will continue this today. Checked his VNS today, see accompanying note. To address his sleep apnea, I will re-refer to a neurologist sleep specialist, as  finding alternatives to the c-pap that can assist him may improve his seizures. Discussed his constipation today as well, explained that the nerves in his gut are not working as well, and recommended mom try to give the stimulant laxative every day to help with consistent movement while continuing with the rest of his regimen.   Continued 100 mg of Vimpat in the morning and afternoon and 200 mg at night. Changed the prescription to 200 mg tablets, give him 1/2 tablet in the morning and afternoon and a whole tablet at night. Continued Keppra and Oxtellar Re-sent referral to the [facility] EMU lab  Recommend giving him a stimulant laxative every day. Plan to discuss how this goes at the next visit as well as if GI is needed.   I spent 44 minutes on day of service on this patient including review of chart, discussion with patient and family, discussion of screening results, coordination with other providers and management of orders and paperwork.     Return in  about 4 months (around 04/20/2022).  I, Mayra Reel, scribed for and in the presence of [name], MD at today's visit on 12/18/2021.   I, [name] MD MPH, personally performed the services described in this documentation, as scribed by Mayra Reel in my presence on 12/18/2021 and it is accurate, complete, and reviewed by me.    [name] MD MPH Neurology and Neurodevelopment [facility]  [address] [place], [place], [place] [address] Phone: [phone] Fax: [phone]

## 2021-12-18 ENCOUNTER — Ambulatory Visit (INDEPENDENT_AMBULATORY_CARE_PROVIDER_SITE_OTHER): Payer: Medicaid Other | Admitting: Pediatrics

## 2021-12-18 ENCOUNTER — Encounter (INDEPENDENT_AMBULATORY_CARE_PROVIDER_SITE_OTHER): Payer: Self-pay | Admitting: Pediatrics

## 2021-12-18 ENCOUNTER — Telehealth (INDEPENDENT_AMBULATORY_CARE_PROVIDER_SITE_OTHER): Payer: Self-pay | Admitting: Pediatrics

## 2021-12-18 VITALS — BP 122/70 | HR 88 | Wt 177.8 lb

## 2021-12-18 DIAGNOSIS — Z9689 Presence of other specified functional implants: Secondary | ICD-10-CM | POA: Diagnosis not present

## 2021-12-18 DIAGNOSIS — G4733 Obstructive sleep apnea (adult) (pediatric): Secondary | ICD-10-CM

## 2021-12-18 DIAGNOSIS — F840 Autistic disorder: Secondary | ICD-10-CM

## 2021-12-18 DIAGNOSIS — G40209 Localization-related (focal) (partial) symptomatic epilepsy and epileptic syndromes with complex partial seizures, not intractable, without status epilepticus: Secondary | ICD-10-CM

## 2021-12-18 MED ORDER — KEPPRA 1000 MG PO TABS: ORAL_TABLET | ORAL | 5 refills | Status: DC

## 2021-12-18 MED ORDER — LACOSAMIDE 200 MG PO TABS: ORAL_TABLET | ORAL | 5 refills | Status: DC

## 2021-12-18 MED ORDER — OXTELLAR XR 600 MG PO TB24: 3.0000 | ORAL_TABLET | Freq: Every day | ORAL | 5 refills | Status: DC

## 2021-12-18 NOTE — Telephone Encounter (Signed)
New prescription was Vimpat 200mg .  Please call pharmacy to confirm.  Dispense as written, no need for brand name only.   MD MPH

## 2021-12-18 NOTE — Telephone Encounter (Signed)
  Name of who is calling:[facility] - [place], [place] - [address]  Caller's Relationship to Patient:  Best contact number:[phone]  Provider they see: Artis Flock  Reason for call:the prescription was sent in as genetric - needs to be written as Dispensed as written. Or brand name only      PRESCRIPTION REFILL ONLY  Name of prescription:[facility] - [place], [place] - [address]  Pharmacy:VIMPAT 100 MG TABS

## 2021-12-18 NOTE — Progress Notes (Signed)
VNS interrogated, patient has had 3 events in the last month: June 9, June 13, July 3. This is consistent with mother's report that events are improved since his sleep is improved.  No changes made today.

## 2021-12-18 NOTE — Patient Instructions (Signed)
Continue to give 100 mg of Vimpat in the morning and afternoon and 200 mg at night. I have changed the prescription to 200 mg tablets, give him 1/2 tablet in the morning and afternoon and a whole tablet at night. Continue with his Keppra and Oxtellar as is.  I will resend the referral to the [facility] EMU lab so you can see a neurologist for sleep.  For his constipation, I recommend giving him the stimulant laxative every day. We will talk about how this goes at the next visit.

## 2021-12-19 NOTE — Telephone Encounter (Signed)
Rx faxed- confirmation received

## 2021-12-28 ENCOUNTER — Telehealth (INDEPENDENT_AMBULATORY_CARE_PROVIDER_SITE_OTHER): Payer: Self-pay | Admitting: Pediatrics

## 2021-12-28 DIAGNOSIS — G40319 Generalized idiopathic epilepsy and epileptic syndromes, intractable, without status epilepticus: Secondary | ICD-10-CM

## 2021-12-28 MED ORDER — VIMPAT 200 MG PO TABS: ORAL_TABLET | ORAL | 5 refills | Status: DC

## 2021-12-28 NOTE — Telephone Encounter (Signed)
Done. TG 

## 2021-12-28 NOTE — Telephone Encounter (Signed)
Call to [facility] at [facility]. She reports he has always received brand name and was told it was because the generic did not control his seizures. RN advised he is new to this provider as his previous provider retired. RN will request it be recent as Brand Name and written Brand Name only in order for insurance to cover it.

## 2021-12-28 NOTE — Telephone Encounter (Signed)
  Name of who is calling: MaryAnn  Caller's Relationship to Patient: Pharmacist   Best contact number: [phone]  Provider they see: Dr. Artis Flock  Reason for call: Per Pharmacist It requires brand name only but the prescriptions was sent in to allow generic. Also his insurance will not accept.     PRESCRIPTION REFILL ONLY  Name of prescription: Vimpat  Pharmacy: [facility]

## 2021-12-31 ENCOUNTER — Encounter (INDEPENDENT_AMBULATORY_CARE_PROVIDER_SITE_OTHER): Payer: Self-pay | Admitting: Pediatrics

## 2022-02-20 ENCOUNTER — Encounter (INDEPENDENT_AMBULATORY_CARE_PROVIDER_SITE_OTHER): Payer: Self-pay | Admitting: Pediatrics

## 2022-02-27 ENCOUNTER — Encounter (INDEPENDENT_AMBULATORY_CARE_PROVIDER_SITE_OTHER): Payer: Self-pay | Admitting: Pediatrics

## 2022-02-27 ENCOUNTER — Encounter: Payer: Self-pay | Admitting: Pediatrics

## 2022-02-28 ENCOUNTER — Ambulatory Visit: Payer: Medicaid Other | Admitting: Dermatology

## 2022-03-01 ENCOUNTER — Encounter (INDEPENDENT_AMBULATORY_CARE_PROVIDER_SITE_OTHER): Payer: Self-pay | Admitting: Pediatrics

## 2022-03-01 MED ORDER — HYDROXYZINE HCL 25 MG PO TABS: 25.0000 mg | ORAL_TABLET | Freq: Every evening | ORAL | 3 refills | Status: DC | PRN

## 2022-03-13 ENCOUNTER — Encounter: Payer: Self-pay | Admitting: Pediatrics

## 2022-03-21 ENCOUNTER — Ambulatory Visit: Payer: Medicaid Other | Admitting: Dermatology

## 2022-03-28 ENCOUNTER — Ambulatory Visit (INDEPENDENT_AMBULATORY_CARE_PROVIDER_SITE_OTHER): Payer: Medicaid Other | Admitting: Dermatology

## 2022-03-28 DIAGNOSIS — L738 Other specified follicular disorders: Secondary | ICD-10-CM | POA: Diagnosis not present

## 2022-03-28 DIAGNOSIS — Z79899 Other long term (current) drug therapy: Secondary | ICD-10-CM

## 2022-03-28 DIAGNOSIS — L739 Follicular disorder, unspecified: Secondary | ICD-10-CM

## 2022-03-28 MED ORDER — DOXYCYCLINE HYCLATE 100 MG PO TABS: 100.0000 mg | ORAL_TABLET | Freq: Every day | ORAL | 3 refills | Status: AC

## 2022-03-28 MED ORDER — FLUCONAZOLE 200 MG PO TABS: 200.0000 mg | ORAL_TABLET | Freq: Every day | ORAL | 4 refills | Status: DC

## 2022-03-28 MED ORDER — KETOCONAZOLE 2 % EX SHAM: 1.0000 | MEDICATED_SHAMPOO | CUTANEOUS | 6 refills | Status: DC

## 2022-03-28 NOTE — Patient Instructions (Signed)
Due to recent changes in healthcare laws, you may see results of your pathology and/or laboratory studies on MyChart before the doctors have had a chance to review them. We understand that in some cases there may be results that are confusing or concerning to you. Please understand that not all results are received at the same time and often the doctors may need to interpret multiple results in order to provide you with the best plan of care or course of treatment. Therefore, we ask that you please give us 2 business days to thoroughly review all your results before contacting the office for clarification. Should we see a critical lab result, you will be contacted sooner.   If You Need Anything After Your Visit  If you have any questions or concerns for your doctor, please call our main line at [phone] and press option 4 to reach your doctor's medical assistant. If no one answers, please leave a voicemail as directed and we will return your call as soon as possible. Messages left after 4 pm will be answered the following business day.   You may also send us a message via MyChart. We typically respond to MyChart messages within 1-2 business days.  For prescription refills, please ask your pharmacy to contact our office. Our fax number is [phone].  If you have an urgent issue when the clinic is closed that cannot wait until the next business day, you can page your doctor at the number below.    Please note that while we do our best to be available for urgent issues outside of office hours, we are not available 24/7.   If you have an urgent issue and are unable to reach us, you may choose to seek medical care at your doctor's office, retail clinic, urgent care center, or emergency room.  If you have a medical emergency, please immediately call 911 or go to the emergency department.  Pager Numbers  - Dr. Kowalski: [phone]  - Dr. Moye: [phone]  - Dr. Stewart:  [phone]  In the event of inclement weather, please call our main line at [phone] for an update on the status of any delays or closures.  Dermatology Medication Tips: Please keep the boxes that topical medications come in in order to help keep track of the instructions about where and how to use these. Pharmacies typically print the medication instructions only on the boxes and not directly on the medication tubes.   If your medication is too expensive, please contact our office at [phone] option 4 or send us a message through MyChart.   We are unable to tell what your co-pay for medications will be in advance as this is different depending on your insurance coverage. However, we may be able to find a substitute medication at lower cost or fill out paperwork to get insurance to cover a needed medication.   If a prior authorization is required to get your medication covered by your insurance company, please allow us 1-2 business days to complete this process.  Drug prices often vary depending on where the prescription is filled and some pharmacies may offer cheaper prices.  The website www.goodrx.com contains coupons for medications through different pharmacies. The prices here do not account for what the cost may be with help from insurance (it may be cheaper with your insurance), but the website can give you the price if you did not use any insurance.  - You can print the associated coupon and take it with   your prescription to the pharmacy.  - You may also stop by our office during regular business hours and pick up a GoodRx coupon card.  - If you need your prescription sent electronically to a different pharmacy, notify our office through [facility] MyChart or by phone at [phone] option 4.     Si Usted Necesita Algo Despus de Su Visita  Tambin puede enviarnos un mensaje a travs de MyChart. Por lo general respondemos a los mensajes de MyChart en el transcurso de 1 a 2  das hbiles.  Para renovar recetas, por favor pida a su farmacia que se ponga en contacto con nuestra oficina. Nuestro nmero de fax es el [phone].  Si tiene un asunto urgente cuando la clnica est cerrada y que no puede esperar hasta el siguiente da hbil, puede llamar/localizar a su doctor(a) al nmero que aparece a continuacin.   Por favor, tenga en cuenta que aunque hacemos todo lo posible para estar disponibles para asuntos urgentes fuera del horario de oficina, no estamos disponibles las 24 horas del da, los 7 das de la semana.   Si tiene un problema urgente y no puede comunicarse con nosotros, puede optar por buscar atencin mdica  en el consultorio de su doctor(a), en una clnica privada, en un centro de atencin urgente o en una sala de emergencias.  Si tiene una emergencia mdica, por favor llame inmediatamente al 911 o vaya a la sala de emergencias.  Nmeros de bper  - Dr. Kowalski: [phone]  - Dra. Moye: [phone]  - Dra. Stewart: [phone]  En caso de inclemencias del tiempo, por favor llame a nuestra lnea principal al [phone] para una actualizacin sobre el estado de cualquier retraso o cierre.  Consejos para la medicacin en dermatologa: Por favor, guarde las cajas en las que vienen los medicamentos de uso tpico para ayudarle a seguir las instrucciones sobre dnde y cmo usarlos. Las farmacias generalmente imprimen las instrucciones del medicamento slo en las cajas y no directamente en los tubos del medicamento.   Si su medicamento es muy caro, por favor, pngase en contacto con nuestra oficina llamando al [phone] y presione la opcin 4 o envenos un mensaje a travs de MyChart.   No podemos decirle cul ser su copago por los medicamentos por adelantado ya que esto es diferente dependiendo de la cobertura de su seguro. Sin embargo, es posible que podamos encontrar un medicamento sustituto a menor costo o llenar un formulario para que el  seguro cubra el medicamento que se considera necesario.   Si se requiere una autorizacin previa para que su compaa de seguros cubra su medicamento, por favor permtanos de 1 a 2 das hbiles para completar este proceso.  Los precios de los medicamentos varan con frecuencia dependiendo del lugar de dnde se surte la receta y alguna farmacias pueden ofrecer precios ms baratos.  El sitio web www.goodrx.com tiene cupones para medicamentos de diferentes farmacias. Los precios aqu no tienen en cuenta lo que podra costar con la ayuda del seguro (puede ser ms barato con su seguro), pero el sitio web puede darle el precio si no utiliz ningn seguro.  - Puede imprimir el cupn correspondiente y llevarlo con su receta a la farmacia.  - Tambin puede pasar por nuestra oficina durante el horario de atencin regular y recoger una tarjeta de cupones de GoodRx.  - Si necesita que su receta se enve electrnicamente a una farmacia diferente, informe a nuestra oficina a travs de MyChart de [place]   o por telfono llamando al [phone] y presione la opcin 4.  

## 2022-03-28 NOTE — Progress Notes (Signed)
   Follow-Up Visit   Subjective  Zachary [name] is a 31 y.o. male who presents for the following: Follow-up (Folliculitis follow up - Doxycycline 100 mg qd prn flares, Ketoconazole 2% shampoo qod, CLN wash 2 times per week).  Accompanied by mother  The following portions of the chart were reviewed this encounter and updated as appropriate:   Tobacco  Allergies  Meds  Problems  Med Hx  Surg Hx  Fam Hx     Review of Systems:  No other skin or systemic complaints except as noted in HPI or Assessment and Plan.  Objective  Well appearing patient in no apparent distress; mood and affect are within normal limits.  A focused examination was performed including scalp, face, back. Relevant physical exam findings are noted in the Assessment and Plan.  Hyperpigmented macules and papule   Assessment & Plan  Folliculitis With Pityrosporum folliculitis Continue Doxycycline 100 mg 1 po qd with food and plenty of fluid x 7 days prn flares, CLN wash 2-5 times per week, Ketoconazole 2% shampoo 2 times per week  Start Fluconazole 200 mg 1 po at beginning of symptoms then 1 po at the end of symptoms  fluconazole (DIFLUCAN) 200 MG tablet Take 1 tablet (200 mg total) by mouth daily. Take one tablet at onset of symptoms/flare and one tablet at the end of symptoms/flare  doxycycline (VIBRA-TABS) 100 MG tablet Take 1 tablet (100 mg total) by mouth daily. With food and plenty of fluid for flares  ketoconazole (NIZORAL) 2 % shampoo Apply 1 Application topically 2 (two) times a week. Wash face, scalp and body 2 times weekly, let sit 5 to 10 minutes before washing off  Return in about 6 months (around 09/27/2022) for Follow up.  I, Ashok Cordia, CMA, am acting as scribe for Sarina Ser, MD . Documentation: I have reviewed the above documentation for accuracy and completeness, and I agree with the above.  Sarina Ser, MD

## 2022-04-02 ENCOUNTER — Encounter: Payer: Self-pay | Admitting: Dermatology

## 2022-05-21 ENCOUNTER — Ambulatory Visit (INDEPENDENT_AMBULATORY_CARE_PROVIDER_SITE_OTHER): Payer: Medicaid Other | Admitting: Pediatrics

## 2022-06-11 ENCOUNTER — Ambulatory Visit: Payer: Medicaid Other | Admitting: Dermatology

## 2022-06-11 ENCOUNTER — Encounter: Payer: Self-pay | Admitting: Pediatrics

## 2022-06-11 DIAGNOSIS — G40219 Localization-related (focal) (partial) symptomatic epilepsy and epileptic syndromes with complex partial seizures, intractable, without status epilepticus: Secondary | ICD-10-CM | POA: Insufficient documentation

## 2022-06-11 MED ORDER — SULFAMETHOXAZOLE-TRIMETHOPRIM 800-160 MG PO TABS: ORAL_TABLET | ORAL | 1 refills | Status: DC

## 2022-06-13 ENCOUNTER — Telehealth: Payer: Self-pay | Admitting: Pediatrics

## 2022-06-13 ENCOUNTER — Encounter: Payer: Self-pay | Admitting: Pediatrics

## 2022-06-13 ENCOUNTER — Ambulatory Visit (INDEPENDENT_AMBULATORY_CARE_PROVIDER_SITE_OTHER): Payer: Medicaid Other | Admitting: Pediatrics

## 2022-06-13 VITALS — BP 130/82 | Wt 175.0 lb

## 2022-06-13 DIAGNOSIS — G808 Other cerebral palsy: Secondary | ICD-10-CM

## 2022-06-13 DIAGNOSIS — Z0001 Encounter for general adult medical examination with abnormal findings: Secondary | ICD-10-CM

## 2022-06-13 DIAGNOSIS — Z0000 Encounter for general adult medical examination without abnormal findings: Secondary | ICD-10-CM

## 2022-06-13 DIAGNOSIS — I10 Essential (primary) hypertension: Secondary | ICD-10-CM

## 2022-06-13 DIAGNOSIS — Z23 Encounter for immunization: Secondary | ICD-10-CM | POA: Diagnosis not present

## 2022-06-13 DIAGNOSIS — G40209 Localization-related (focal) (partial) symptomatic epilepsy and epileptic syndromes with complex partial seizures, not intractable, without status epilepticus: Secondary | ICD-10-CM | POA: Diagnosis not present

## 2022-06-13 DIAGNOSIS — G40319 Generalized idiopathic epilepsy and epileptic syndromes, intractable, without status epilepticus: Secondary | ICD-10-CM

## 2022-06-13 DIAGNOSIS — G40219 Localization-related (focal) (partial) symptomatic epilepsy and epileptic syndromes with complex partial seizures, intractable, without status epilepticus: Secondary | ICD-10-CM

## 2022-06-13 MED ORDER — SULFAMETHOXAZOLE-TRIMETHOPRIM 800-160 MG PO TABS: ORAL_TABLET | ORAL | 4 refills | Status: DC

## 2022-06-13 MED ORDER — FLUOCINOLONE ACETONIDE BODY 0.01 % EX OIL: 1.0000 "application " | TOPICAL_OIL | Freq: Every day | CUTANEOUS | 12 refills | Status: AC

## 2022-06-13 MED ORDER — CIPROFLOXACIN-DEXAMETHASONE 0.3-0.1 % OT SUSP: 4.0000 [drp] | Freq: Two times a day (BID) | OTIC | 6 refills | Status: AC

## 2022-06-13 MED ORDER — CHLORHEXIDINE GLUCONATE 4 % EX LIQD: Freq: Every day | CUTANEOUS | 0 refills | Status: AC | PRN

## 2022-06-13 NOTE — Progress Notes (Signed)
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 wheelchair---needs a ELECTRIC wheelchair [employer] for implant to help with sleep apnea  [place] [facility] followed for hyperpigmented skin lesions  Dr Rogers Blocker ---[facility] --Dr Sherril [facility] --[facility]- [facility]-DR Sheliah Mends Pulmonary-N/A Endocrinology--N/A Dental--[facility] Ophthal-none Urology--N/A Surgeon--N/A  Wheelchair--needs an electric one due to weight   Hospital bed Chill out chair Shower chair in shower Oakley lift Diapers --wipes and gloves Pads  Nebulizer Pulse ox Oxygen tubes and tank  Ensure--2 cans per day  Meds needed--- Dermasmooth  Ear drops  Bactrim  Developmental History Developmental delay  Function: Mobility: manual wheelchair Pain concerns: no Hand function: Right: reduced dexterity Left: reduced dexterity Spine curvature: mild Swallowing: normal and modified diet (pureed) Toileting: dependent   Review of Systems: Vision: needs testing Hearing: followed by ENT Seizures: yes - controlled Constipation: no Fractures: no  The following portions of the patient's history were reviewed and updated as appropriate: allergies, current medications, past family history, past medical history, past social history, past surgical history and problem list.  Objective:    Physical Exam  Cognition: non-interactive HEENT---normal Eyes---normal  Respiratory: normal, no increased effort CVS--No murmurs --good heart sounds Lower extremity function: decreased Abdomen: normal--no G tube present Spine scoliosis: mild  Sitting Ability: assisted Gait: wheelchair     Assessment:   Annual Check up  Seizures Autism spectrum Quadriparesis Wheelchair bound   Plan:    Gross motor: delayed  Fine motor/ADL:  delayed  Orthopedics/bracing: bilateral AFO's --present today 7. Other equipment:  bath chair, gait trainer, hand/wrist splint(s), life system, stander, walker, wheelchair and RAMP for house. NEEDS Needs refills on antibiotic ear drops Needs prescription for oxygen --[facility]. Urinary retention--need for catheters Risk of COVID-19--need for masks Gaining weight and mom has trouble moving the wheelchair---needs a ELECTRIC one

## 2022-06-13 NOTE — Telephone Encounter (Signed)
  Current Outpatient Medications:    acetaminophen (TYLENOL) 500 MG tablet, Take 500 mg by mouth every 6 (six) hours as needed for mild pain, moderate pain, fever or headache. (Patient not taking: Reported on 12/18/2021), Disp: , Rfl:    albuterol (PROVENTIL) (2.5 MG/3ML) 0.083% nebulizer solution, Take 3 mLs (2.5 mg total) by nebulization every 6 (six) hours as needed for wheezing or shortness of breath. (Patient not taking: Reported on 12/18/2021), Disp: 75 mL, Rfl: 12   budesonide (PULMICORT) 0.5 MG/2ML nebulizer solution, Inhale into the lungs. (Patient not taking: Reported on 12/18/2021), Disp: , Rfl:    chlorhexidine (HIBICLENS) 4 % external liquid, Apply topically daily as needed., Disp: 120 mL, Rfl: 0   ciprofloxacin (CILOXAN) 0.3 % ophthalmic solution, 5 drops to right ear twice daily, Disp: , Rfl:    diazePAM, 20 MG Dose, (VALTOCO 20 MG DOSE) 2 x 10 MG/0.1ML LQPK, Place 20 mg into the nose as needed (seizure lasting longer than 5 minutes). (Patient not taking: Reported on 12/18/2021), Disp: 2 each, Rfl: 2   Docusate Sodium (DSS) 100 MG CAPS, Take 2 capsules by mouth daily., Disp: , Rfl:    doxycycline (VIBRA-TABS) 100 MG tablet, Take 1 tablet (100 mg total) by mouth daily. With food and plenty of fluid, Disp: 30 tablet, Rfl: 3   fluconazole (DIFLUCAN) 200 MG tablet, Take 1 tablet (200 mg total) by mouth daily. Take one tablet at onset of symptoms and one tablet at the end of symptoms, Disp: 2 tablet, Rfl: 4   hydrOXYzine (ATARAX) 25 MG tablet, Take 1 tablet (25 mg total) by mouth at bedtime as needed (insomnia)., Disp: 30 tablet, Rfl: 3   KEPPRA 1000 MG tablet, Take 1-1/2 tablets in the morning and 2 tablets at night., Disp: 108 tablet, Rfl: 5   ketoconazole (NIZORAL) 2 % shampoo, Apply 1 Application topically 2 (two) times a week. Wash face, scalp and body 2 times weekly, let sit 5 to 10 minutes before washing off, Disp: 120 mL, Rfl: 6   LORazepam (ATIVAN) 2 MG tablet, Take 1 tablet by mouth as  needed for agitation. (Patient not taking: Reported on 12/18/2021), Disp: 30 tablet, Rfl: 5   OXTELLAR XR 600 MG TB24, Take 3 tablets by mouth at bedtime., Disp: 93 tablet, Rfl: 5   polyethylene glycol (MIRALAX / GLYCOLAX) 17 g packet, Take 1 Container by mouth daily as needed., Disp: , Rfl:    spironolactone (ALDACTONE) 50 MG tablet, Take 100 mg by mouth daily., Disp: , Rfl:    sulfamethoxazole-trimethoprim (BACTRIM DS) 800-160 MG tablet, TAKE ONE TABLET TWICE DAILY for 14 days, Disp: 28 tablet, Rfl: 1   VIMPAT 200 MG TABS tablet, Give 1/2 tablet in morning and afternoon, full table at night, Disp: 60 tablet, Rfl: 5

## 2022-06-13 NOTE — Patient Instructions (Signed)
Preventive Care 21-33 Years Old, Male Preventive care refers to lifestyle choices and visits with your health care provider that can promote health and wellness. Preventive care visits are also called wellness exams. What can I expect for my preventive care visit? Counseling During your preventive care visit, your health care provider may ask about your: Medical history, including: Past medical problems. Family medical history. Current health, including: Emotional well-being. Home life and relationship well-being. Sexual activity. Lifestyle, including: Alcohol, nicotine or tobacco, and drug use. Access to firearms. Diet, exercise, and sleep habits. Safety issues such as seatbelt and bike helmet use. Sunscreen use. Work and work environment. Physical exam Your health care provider may check your: Height and weight. These may be used to calculate your BMI (body mass index). BMI is a measurement that tells if you are at a healthy weight. Waist circumference. This measures the distance around your waistline. This measurement also tells if you are at a healthy weight and may help predict your risk of certain diseases, such as type 2 diabetes and high blood pressure. Heart rate and blood pressure. Body temperature. Skin for abnormal spots. What immunizations do I need?  Vaccines are usually given at various ages, according to a schedule. Your health care provider will recommend vaccines for you based on your age, medical history, and lifestyle or other factors, such as travel or where you work. What tests do I need? Screening Your health care provider may recommend screening tests for certain conditions. This may include: Lipid and cholesterol levels. Diabetes screening. This is done by checking your blood sugar (glucose) after you have not eaten for a while (fasting). Hepatitis B test. Hepatitis C test. HIV (human immunodeficiency virus) test. STI (sexually transmitted infection)  testing, if you are at risk. Talk with your health care provider about your test results, treatment options, and if necessary, the need for more tests. Follow these instructions at home: Eating and drinking  Eat a healthy diet that includes fresh fruits and vegetables, whole grains, lean protein, and low-fat dairy products. Drink enough fluid to keep your urine pale yellow. Take vitamin and mineral supplements as recommended by your health care provider. Do not drink alcohol if your health care provider tells you not to drink. If you drink alcohol: Limit how much you have to 0-2 drinks a day. Know how much alcohol is in your drink. In the [place] one drink equals one 12 oz bottle of beer (355 mL), one 5 oz glass of wine (148 mL), or one 1 oz glass of hard liquor (44 mL). Lifestyle Brush your teeth every morning and night with fluoride toothpaste. Floss one time each day. Exercise for at least 30 minutes 5 or more days each week. Do not use any products that contain nicotine or tobacco. These products include cigarettes, chewing tobacco, and vaping devices, such as e-cigarettes. If you need help quitting, ask your health care provider. Do not use drugs. If you are sexually active, practice safe sex. Use a condom or other form of protection to prevent STIs. Find healthy ways to manage stress, such as: Meditation, yoga, or listening to music. Journaling. Talking to a trusted person. Spending time with friends and family. Minimize exposure to UV radiation to reduce your risk of skin cancer. Safety Always wear your seat belt while driving or riding in a vehicle. Do not drive: If you have been drinking alcohol. Do not ride with someone who has been drinking. If you have been using any mind-altering substances   or drugs. While texting. When you are tired or distracted. Wear a helmet and other protective equipment during sports activities. If you have firearms in your house, make sure you  follow all gun safety procedures. Seek help if you have been physically or sexually abused. What's next? Go to your health care provider once a year for an annual wellness visit. Ask your health care provider how often you should have your eyes and teeth checked. Stay up to date on all vaccines. This information is not intended to replace advice given to you by your health care provider. Make sure you discuss any questions you have with your health care provider. Document Revised: 11/16/2020 Document Reviewed: 11/16/2020 Elsevier Patient Education  2023 Elsevier Inc.  

## 2022-06-19 ENCOUNTER — Telehealth: Payer: Self-pay

## 2022-06-19 NOTE — Telephone Encounter (Signed)
Mother dropped of forms from [employer]. Asking to be completed by provider. Placed in Dr. Docia Barrier office.

## 2022-06-21 NOTE — Telephone Encounter (Signed)
Child medical report filled  

## 2022-06-22 NOTE — Telephone Encounter (Signed)
Called mother. Mother picking up forms today. Forms placed up front in patient folders.

## 2022-06-25 ENCOUNTER — Ambulatory Visit (INDEPENDENT_AMBULATORY_CARE_PROVIDER_SITE_OTHER): Payer: Medicaid Other | Admitting: Pediatrics

## 2022-07-05 ENCOUNTER — Other Ambulatory Visit: Payer: Self-pay | Admitting: Family

## 2022-07-05 ENCOUNTER — Other Ambulatory Visit (INDEPENDENT_AMBULATORY_CARE_PROVIDER_SITE_OTHER): Payer: Self-pay | Admitting: Pediatrics

## 2022-07-05 DIAGNOSIS — G40319 Generalized idiopathic epilepsy and epileptic syndromes, intractable, without status epilepticus: Secondary | ICD-10-CM

## 2022-07-05 DIAGNOSIS — G40209 Localization-related (focal) (partial) symptomatic epilepsy and epileptic syndromes with complex partial seizures, not intractable, without status epilepticus: Secondary | ICD-10-CM

## 2022-07-09 ENCOUNTER — Telehealth (INDEPENDENT_AMBULATORY_CARE_PROVIDER_SITE_OTHER): Payer: Self-pay | Admitting: Pediatrics

## 2022-07-09 DIAGNOSIS — G40209 Localization-related (focal) (partial) symptomatic epilepsy and epileptic syndromes with complex partial seizures, not intractable, without status epilepticus: Secondary | ICD-10-CM

## 2022-07-09 NOTE — Telephone Encounter (Signed)
  Name of who is calling: Zachary [name]   Caller's Relationship to Patient: Mom  Best contact number: [phone]  Provider they see: [facility]  Reason for call: Mom called and stated that Zachary [name] needs a refill on prescriptions. He has enough to last him for tonight and in the morning. She states provider sent in a prescription but it was for 10 tablets and the generic brand which is incorrect. Mom would like a call once prescription is sent to pharmacy.     PRESCRIPTION REFILL ONLY  Name of prescription: KEPPRA 1000mg    Pharmacy: [facility]

## 2022-07-09 NOTE — Telephone Encounter (Signed)
Provider Verbally advised.  Will print and Fax RX.  SS, [place]

## 2022-07-09 NOTE — Telephone Encounter (Signed)
  Name of who is calling: [facility]  Caller's Relationship to Patient: n/a  Best contact number: [phone]  Provider they see: Rogers Blocker  Reason for call: Calling in ref to prior authorization issues that need to be fixed.  He receives the brand name only, but on the form it says "substitution allowed" but it should be changed to brand name only. Written for a quantity that wont last him, It should be for a 30 day supply which should be 105 tablets and not the 10 it is written for now.       PRESCRIPTION REFILL ONLY  Name of prescription: levETIRAcetam (KEPPRA) 1000 MG tablet   Pharmacy:

## 2022-07-10 MED ORDER — KEPPRA 1000 MG PO TABS: ORAL_TABLET | ORAL | 5 refills | Status: DC

## 2022-07-10 NOTE — Telephone Encounter (Signed)
Contacted pt's mother. Verified pt's name and DOB as well as mom's name.   Informed mom that RX was faxed over to pharmacy and corrections were made.  SS, [facility]

## 2022-07-17 ENCOUNTER — Encounter: Payer: Self-pay | Admitting: Pediatrics

## 2022-07-17 DIAGNOSIS — R051 Acute cough: Secondary | ICD-10-CM

## 2022-07-17 MED ORDER — BUDESONIDE 0.5 MG/2ML IN SUSP: 0.5000 mg | Freq: Every day | RESPIRATORY_TRACT | 12 refills | Status: AC

## 2022-07-17 NOTE — Addendum Note (Signed)
Addended by: Marcha Solders on: 07/17/2022 04:45 PM   Modules accepted: Orders

## 2022-07-19 ENCOUNTER — Ambulatory Visit
Admission: RE | Admit: 2022-07-19 | Discharge: 2022-07-19 | Disposition: A | Discharge status: Home / Self Care | Payer: Medicaid Other | Source: Ambulatory Visit | Attending: Pediatrics | Admitting: Pediatrics

## 2022-07-19 ENCOUNTER — Other Ambulatory Visit: Payer: Self-pay | Admitting: Pediatrics

## 2022-07-19 DIAGNOSIS — R051 Acute cough: Secondary | ICD-10-CM

## 2022-07-19 MED ORDER — AMOXICILLIN-POT CLAVULANATE 500-125 MG PO TABS: 500.0000 mg | ORAL_TABLET | Freq: Two times a day (BID) | ORAL | 0 refills | Status: DC

## 2022-08-08 ENCOUNTER — Encounter: Payer: Self-pay | Admitting: Pediatrics

## 2022-08-08 DIAGNOSIS — R140 Abdominal distension (gaseous): Secondary | ICD-10-CM

## 2022-08-09 ENCOUNTER — Ambulatory Visit
Admission: RE | Admit: 2022-08-09 | Discharge: 2022-08-09 | Disposition: A | Discharge status: Home / Self Care | Payer: Medicaid Other | Source: Ambulatory Visit | Attending: Pediatrics | Admitting: Pediatrics

## 2022-08-09 DIAGNOSIS — R140 Abdominal distension (gaseous): Secondary | ICD-10-CM

## 2022-08-15 NOTE — Progress Notes (Signed)
Patient: Zachary [name] MRN: [id number] Sex: male DOB: 12-14-1990  Provider: Carylon Perches, MD Location of Care: [facility]  Note type: Routine follow-up  History of Present Illness:  Zachary [name] is a 32 y.o. male with history of focal epilepsy s/p VNS insertion, autism spectrum disorder, congenital quadriparesis, obstructive sleep apnea, and intellectual disability who I am seeing for routine follow-up. Patient was last seen on 12/18/21 where I continued Vimpat, Keppra, and Oxtellar, referred to [facility] sleep lab, and recommended stimulant laxative daily.  Since the last appointment, mom reached out on 02/20/22 to report fussiness disrupting sleep for which I started atarax. Mom also reported two seizures on 03/01/22. He saw the sleep specialist 06/11/21 where they noted they would order a home sleep test (scheduled for 11/24/22) and talk to ENT about hypoglossal nerve stimulation. They also recommended trying Onfi for better seizure control.   Patient presents today with his mother. Has been doing pretty well for a while on seizures. Generally, he has a cluster of seizures and then is clear for a while, he has been in the clear since the last visit.   Plan to return to [facility] for sleep study. Home test not feasible related to VNS. Plan for hypoglossal nerve stimulation evaluation to address sleep apnea. Plan to follow up with [facility] on this. Will continue with local ENT for bleeding ears.   For allergies, continues to take cetrizine and Flonase (with occasional breaks for bloody nose). Continues to have symptoms of allergies.   Does not sleep many hours. When mom checks in on him in early am, he is always awake. Falls asleep well with melatonin and if mom lays with him about 4-5 days a week. Other days he continues to struggle.   Struggling with constipation. Giving Miralax when she can, he does not like to tolerate it. She is giving fiber and lots of food to try to  alleviate this. Right now stooling 1-2 times per week. At first it can be hard, but the second half of the stool is soft. She reports she is having to stimulate him or give him an enema to get him to stool at all.   Patient History:  Age at seizure onset: 32 years of age  Description of all seizure types and duration: Semiology #1: Generalization tonic-clonic lasting up to 2 minutes in duration.  Semiology #2: Focal seizures with hiccup noises, slight tremble of hand, and unresponsiveness that stop with VNS magnet activation.   Complications from seizures (trauma, etc.): He has broken his left shoulder from seizure.  h/o status epilepticus: No   Date of most recent seizure: 03/01/22   Seizure frequency past month (exact number or average per day): 2 Past 3 months: 2   Current AEDs and Current side effects: Keppra 1500 mg BID Oxtellar XR 1800 mg daily at night  Vimpat 100 mg in morning, 100 mg at 3 pm and 400 mg at bedtime.    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 Hyman Bower. Patient's AHI was 39.6 RDI is 43.6 positional component. Patient will need desensitization and a gentle approach to CPAP titration.    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 .     Surgical History Past Surgical History:  Procedure Laterality Date   FOOT SURGERY     FRACTURE SURGERY     HIP SURGERY     IMPLANTATION VAGAL NERVE STIMULATOR     Pediatomy  2009   TONSILLECTOMY  2009    TYMPANOSTOMY TUBE PLACEMENT  2009    Family History family history includes Cancer in his paternal grandmother; Diabetes in his paternal grandfather.   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   He lives with his mother, maternal grandmother, and mom has a client who lives in the home as an AFL    He enjoys swimming and going to the beach.    Allergies Allergies  Allergen Reactions   Depakote [Divalproex Sodium] Other (See Comments)    Low Platelets   Lyrica [Pregabalin] Other (See Comments)    Sleepiness   Penicillins Hives and Rash    Has patient had a PCN reaction causing immediate rash, facial/tongue/throat swelling, SOB or lightheadedness with hypotension: Unknown Has patient had a PCN reaction causing severe rash involving mucus membranes or skin necrosis: Yes Has patient had a PCN reaction that required hospitalization: No Has patient had a PCN reaction occurring within the last 10 years: No If all of the above answers are "NO", then may proceed with Cephalosporin use.    Topamax [Topiramate] Other (See Comments)    Weight Loss   Valproic Acid Other (See Comments)    "WILD MAN"     Low Platelets    Medications Current Outpatient Medications on File Prior to Visit  Medication Sig Dispense Refill   budesonide (PULMICORT) 0.5 MG/2ML nebulizer solution Take 2 mLs (0.5 mg total) by nebulization daily. 60 mL 12   cetirizine (ZYRTEC) 10 MG tablet Take 10 mg by mouth daily.     Cholecalciferol 50 MCG (2000 UT) CAPS Take 1 tablet by mouth at bedtime.     ciprofloxacin (CILOXAN) 0.3 % ophthalmic solution 5 drops to right ear twice daily     Docusate Sodium (DSS) 100 MG CAPS Take by mouth.     doxycycline (VIBRA-TABS) 100 MG tablet      fluconazole (DIFLUCAN) 200 MG tablet Take 1 tablet (200 mg total) by mouth daily. Take one tablet at onset of symptoms and one tablet at the end of symptoms 2 tablet 4   Fluocinolone  Acetonide Body (DERMA-SMOOTHE/FS BODY) 0.01 % OIL      fluticasone (FLONASE) 50 MCG/ACT nasal spray Place 1 spray into both nostrils daily.     ketoconazole (NIZORAL) 2 % shampoo Apply 1 Application topically 2 (two) times a week. Wash face, scalp and body 2 times weekly, let sit 5 to 10 minutes before washing off 120 mL 6   LORazepam (ATIVAN) 1 MG tablet Take by mouth.     polyethylene glycol (MIRALAX / GLYCOLAX) 17 g packet Take 1 Container by mouth daily as needed.     promethazine (PHENERGAN) 25 MG suppository Place rectally.     spironolactone (ALDACTONE) 50 MG tablet Take 100 mg by mouth daily.     diazePAM, 20 MG Dose, (VALTOCO 20 MG DOSE) 2 x 10  MG/0.1ML LQPK      No current facility-administered medications on file prior to visit.   The medication list was reviewed and reconciled. All changes or newly prescribed medications were explained.  A complete medication list was provided to the patient/caregiver.  Physical Exam BP 118/82 (BP Location: Right Arm, Patient Position: Sitting, Cuff Size: Normal)   Pulse 82   Wt 174 lb (78.9 kg) Comment: Last Known Weight: 2 Weeks ago. Mom does not want to weight due to patient having ear problems  BMI 29.87 kg/m  Facility age limit for growth %iles is 20 years.  No results found. Gen: well appearing neuroaffected young man Skin: No rash, No neurocutaneous stigmata. HEENT: Normocephalic, no dysmorphic features, no conjunctival injection, nares patent, mucous membranes moist, oropharynx clear.  Neck: Supple, no meningismus. No focal tenderness. Resp: Clear to auscultation bilaterally CV: Regular rate, normal S1/S2, no murmurs, no rubs Abd: BS present, abdomen soft, non-tender, non-distended. No hepatosplenomegaly or mass Ext: Warm and well-perfused. No deformities, no muscle wasting, ROM full.  Neurological Examination: MS: Awake, alert.  Nonverbal, but interactive, reacts appropriately to conversation.   Cranial Nerves: Pupils were equal  and reactive to light;  No clear visual field defect, no nystagmus; no ptsosis, face symmetric with full strength of facial muscles, hearing grossly intact, palate elevation is symmetric. Motor-Moves extremities at least antigravity. No abnormal movements Reflexes- Reflexes 2+ and symmetric in the biceps, triceps, patellar and achilles tendon. Plantar responses flexor bilaterally, no clonus noted Sensation: Responds to touch in all extremities.  Coordination: Does not reach for objects.  Gait: wheelchair dependent    Diagnosis: 1. 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 male with history of focal epilepsy s/p VNS insertion, autism spectrum disorder, congenital quadriparesis, obstructive sleep apnea, and intellectual disability who I am seeing in follow-up. I am glad to hear Zachary [name] has had no seizures since the last visit, refilled all AED today. To assess for any subclinical events and to get an updated baseline brain function, ordered EEG today.   Reviewed all medications with mom as well. Recommend condensing to TID medication schedule to ease care giving. This may also assist with sleep at night. If switch to TID dosing does not help with sleep, recommend hydroxyzine, ordered today.  Discussed constipation with mom as well today. Given description of symptoms, motility seems to be difficult, to address recommend senna. Reviewed risks and benefits of this medication with mom today.   Due to patient's medical condition, patient is indefinitely incontinent of stool and urine.  It is medically necessary for them to use diapers, underpads, and gloves to assist with hygiene and skin integrity.  They require a frequency of up to 200 a month.   - Refilled Keppra, Oxtellar, and Vimpat  - Ordered EEG  - Rearanged medication schedule to allow for  TID dosing  - Ordered hydroxyzine  - Start senna  - Referred to complex care  I spent 45 minutes on day of service on this patient including review of chart, discussion with patient and family, discussion of screening results, coordination with other providers and management of orders and paperwork.     Return in about 3 months (around 11/20/2022).  I, Scharlene Gloss, scribed for and in the presence of Carylon Perches, MD at today's visit on 08/20/2022.   ICarylon Perches MD  [facility], personally performed the services described in this documentation, as scribed by Scharlene Gloss in my presence on 08/20/2022 and it is accurate, complete, and reviewed by me.    Carylon Perches MD MPH Neurology [facility]  [address], [place], [place] [address] Phone: [phone] Fax: [phone]

## 2022-08-20 ENCOUNTER — Ambulatory Visit (INDEPENDENT_AMBULATORY_CARE_PROVIDER_SITE_OTHER): Payer: Medicaid Other | Admitting: Pediatrics

## 2022-08-20 ENCOUNTER — Encounter (INDEPENDENT_AMBULATORY_CARE_PROVIDER_SITE_OTHER): Payer: Self-pay | Admitting: Pediatrics

## 2022-08-20 VITALS — BP 118/82 | HR 82 | Wt 174.0 lb

## 2022-08-20 DIAGNOSIS — F840 Autistic disorder: Secondary | ICD-10-CM | POA: Diagnosis not present

## 2022-08-20 DIAGNOSIS — G40319 Generalized idiopathic epilepsy and epileptic syndromes, intractable, without status epilepticus: Secondary | ICD-10-CM

## 2022-08-20 DIAGNOSIS — G40209 Localization-related (focal) (partial) symptomatic epilepsy and epileptic syndromes with complex partial seizures, not intractable, without status epilepticus: Secondary | ICD-10-CM

## 2022-08-20 DIAGNOSIS — G808 Other cerebral palsy: Secondary | ICD-10-CM

## 2022-08-20 DIAGNOSIS — Z9689 Presence of other specified functional implants: Secondary | ICD-10-CM

## 2022-08-20 DIAGNOSIS — G4733 Obstructive sleep apnea (adult) (pediatric): Secondary | ICD-10-CM

## 2022-08-20 MED ORDER — KEPPRA 1000 MG PO TABS: ORAL_TABLET | ORAL | 5 refills | Status: DC

## 2022-08-20 MED ORDER — HYDROXYZINE HCL 25 MG PO TABS: 25.0000 mg | ORAL_TABLET | Freq: Every evening | ORAL | 3 refills | Status: DC | PRN

## 2022-08-20 MED ORDER — OXTELLAR XR 600 MG PO TB24: 3.0000 | ORAL_TABLET | Freq: Every day | ORAL | 5 refills | Status: DC

## 2022-08-20 MED ORDER — VIMPAT 200 MG PO TABS: ORAL_TABLET | ORAL | 2 refills | Status: DC

## 2022-08-20 MED ORDER — SENNA 8.6 MG PO CAPS: 8.6000 mg | ORAL_CAPSULE | Freq: Every day | ORAL | 11 refills | Status: DC

## 2022-08-20 NOTE — Patient Instructions (Addendum)
I ordered an EEG today, they can schedule that up front.  I sent an order for hydroxyzine, this can also help with sleep. But, try to change the medication schedule first before trying this.  I will prescribe Senna, to help the muscles of  his gut work harder.  Referred him to our complex care clinic today.   Zachary [name]'s Medication Schedule Medicine 8:30 am  3 pm 8:30 pm  Keppra  1 1/2 tablets   2 tablets   Vimpat  1/2 tablet 1/2 tablet 1 tablet  Oxtellar    3 tablets   Spironolactone  1 tablet   Cetirizine    1 tablet  Flonase    2 sprays (one in each nostril)   Vit D    1 tablet   Senna  1 capsule

## 2022-09-06 ENCOUNTER — Encounter (INDEPENDENT_AMBULATORY_CARE_PROVIDER_SITE_OTHER): Payer: Self-pay | Admitting: Pediatrics

## 2022-09-06 NOTE — Telephone Encounter (Signed)
Mother called and requested form be emailed and mailed to her. Emailed form to [email or web address] and sent form to mailing address on file.

## 2022-10-01 ENCOUNTER — Ambulatory Visit (INDEPENDENT_AMBULATORY_CARE_PROVIDER_SITE_OTHER): Payer: Medicaid Other | Admitting: Family

## 2022-10-01 ENCOUNTER — Ambulatory Visit (INDEPENDENT_AMBULATORY_CARE_PROVIDER_SITE_OTHER): Payer: Medicaid Other | Admitting: Pediatrics

## 2022-10-01 ENCOUNTER — Other Ambulatory Visit (INDEPENDENT_AMBULATORY_CARE_PROVIDER_SITE_OTHER): Payer: Self-pay | Admitting: Pediatrics

## 2022-10-01 ENCOUNTER — Other Ambulatory Visit (INDEPENDENT_AMBULATORY_CARE_PROVIDER_SITE_OTHER): Payer: Self-pay | Admitting: Family

## 2022-10-01 DIAGNOSIS — G808 Other cerebral palsy: Secondary | ICD-10-CM

## 2022-10-01 DIAGNOSIS — G40319 Generalized idiopathic epilepsy and epileptic syndromes, intractable, without status epilepticus: Secondary | ICD-10-CM

## 2022-10-01 DIAGNOSIS — G4733 Obstructive sleep apnea (adult) (pediatric): Secondary | ICD-10-CM

## 2022-10-01 DIAGNOSIS — N3942 Incontinence without sensory awareness: Secondary | ICD-10-CM

## 2022-10-01 DIAGNOSIS — F819 Developmental disorder of scholastic skills, unspecified: Secondary | ICD-10-CM | POA: Diagnosis not present

## 2022-10-01 DIAGNOSIS — G40209 Localization-related (focal) (partial) symptomatic epilepsy and epileptic syndromes with complex partial seizures, not intractable, without status epilepticus: Secondary | ICD-10-CM

## 2022-10-01 DIAGNOSIS — K5900 Constipation, unspecified: Secondary | ICD-10-CM

## 2022-10-01 MED ORDER — LACOSAMIDE 200 MG PO TABS: ORAL_TABLET | ORAL | 5 refills | Status: DC

## 2022-10-01 NOTE — Progress Notes (Signed)
EEG complete - results pending 

## 2022-10-01 NOTE — Progress Notes (Signed)
Zachary [name]   MRN:  [id number]  02-20-1991   Provider: [name] Location of Care: [facility]  Visit type: New patient  Referral source: [name], MD History from: Epic chart and patient's mother  History:  Copied from previous record: History of focal epilepsy s/p VNS insertion, autism spectrum disorder, congenital quadriparesis, obstructive sleep apnea, and intellectual disability. He is taking and tolerating VImpat, Keppra, and Oxtellar XR for seizures.   Due to his medical condition, he is indefinitely incontinent of stool and urine.  It is medically necessary for him to use diapers, underpads, and gloves to assist with hygiene and skin integrity.     Zachary [name] was referred to the [facility] program for care coordination and support of his multiple medical problems.   Today's concerns: Had EEG today. Mom is anxious for results of that study Has upcoming visit at [facility] for consideration of hypoglossal nerve stimulation for OSA.  Has ongoing problems with sleep and can be irritable and fussy at times Having ongoing problems with seasonal allergies. Believes Cetirizine and Flonase have helped some.  Continues to struggle with constipation. Treated with Miralax, fiber and diet but sometimes needs rectal stimulation or an enema to produce stool.  Will be starting new day program next week. Meba has been otherwise generally healthy since he was last seen. No health concerns today other than previously mentioned.  Review of systems: Please see HPI for neurologic and other pertinent review of systems. Otherwise all other systems were reviewed and were negative.  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 Theressa Stamps. Patient's AHI was 39.6 RDI is 43.6 positional component. Patient will need desensitization and a gentle approach to CPAP titration.    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 .     Past medical history comments: See HPI Copied from previous record: Age at seizure onset: 32 years of age  Description of all seizure types and duration: Semiology #1: Generalization tonic-clonic lasting up to 2 minutes in duration.  Semiology #2: Focal seizures with hiccup noises, slight tremble of hand, and unresponsiveness that stop with VNS magnet activation.   Complications from seizures (trauma, etc.): He has broken his left shoulder from seizure.  h/o status epilepticus: No    Surgical history: Past Surgical History:  Procedure Laterality Date   FOOT SURGERY     FRACTURE SURGERY     HIP SURGERY     IMPLANTATION VAGAL NERVE STIMULATOR     Pediatomy  2009   TONSILLECTOMY  2009   TYMPANOSTOMY TUBE PLACEMENT  2009     Family history: family history includes Cancer in his paternal grandmother; Diabetes in his paternal grandfather.   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   He lives with his mother, maternal grandmother, and mom has a client who lives in the home as an AFL    He enjoys swimming and going to the beach.   Social Determinants of Health   Financial Resource Strain: Not on file  Food Insecurity: Not on file  Transportation Needs: Not on file  Physical Activity: Not on file  Stress: Not on file  Social Connections: Not on file  Intimate Partner Violence: Not on file    Past/failed meds: Copied from previous record: Topamax discontinued due to weight loss Depakote discontinued due to low platelets  Pregabalin discontinued due to sleepiness  Allergies: Allergies  Allergen Reactions   Depakote [Divalproex Sodium] Other (See Comments)    Low Platelets   Lyrica [Pregabalin] Other (See Comments)    Sleepiness   Penicillins Hives and Rash    Has patient had a PCN reaction causing immediate rash, facial/tongue/throat swelling, SOB or lightheadedness with hypotension: Unknown Has patient had a PCN reaction causing severe rash involving mucus membranes or skin necrosis: Yes Has patient had a PCN reaction that required hospitalization: No Has patient had a PCN reaction occurring within the last 10 years: No If all of the above answers are "NO", then may proceed with Cephalosporin use.    Topamax [Topiramate] Other (See Comments)    Weight Loss   Valproic Acid Other (See Comments)    "WILD MAN"     Low Platelets    Immunizations: Immunization History  Administered Date(s) Administered   DTaP 09/19/1989, 11/20/1989, 01/21/1990, 10/24/1990, 02/28/1995   HIB (PRP-OMP) 09/19/1989, 11/20/1989, 01/21/1990, 10/24/1990   Hepatitis B 03/13/2002, 04/17/2002, 09/09/2002   IPV 09/19/1989, 11/20/1989, 10/24/1990, 02/28/1995   Influenza Split  01/22/2008, 03/01/2009, 04/05/2011   Influenza,inj,Quad PF,6+ Mos 03/12/2016, 04/04/2017, 04/15/2018, 05/05/2019, 05/18/2020, 06/13/2022   Influenza,inj,quad, With Preservative 04/02/2013, 01/29/2014, 01/31/2015   MMR 10/24/1990, 02/28/1995   Meningococcal Conjugate 06/25/2011   Moderna Sars-Covid-2 Vaccination 08/06/2019, 09/11/2019, 04/23/2020   Td 11/19/2007   Tdap 05/05/2019    Diagnostics/Screenings:  Physical Exam: There were no vitals taken for this visit.  General: well developed, well nourished man, seated in wheelchair, in no evident distress Head: normocephalic and atraumatic. Oropharynx benign. No dysmorphic features. Neck: supple Cardiovascular: regular rate and rhythm, no murmurs. Respiratory: clear to auscultation bilaterally Abdomen: bowel sounds present all four quadrants, abdomen soft, non-tender, non-distended.  Musculoskeletal: no skeletal deformities or obvious scoliosis. Has increased tone in the lower greater than upper extremities Skin: no rashes or neurocutaneous lesions  Neurologic Exam Mental Status: awake and fully alert. Has no language.  Smiles responsively at times.  Cranial Nerves: fundoscopic exam - red reflex present.  Unable to fully visualize fundus.  Pupils equal briskly reactive to light.  Turns to localize faces and objects in the periphery. Turns to localize sounds in the periphery. Facial movements are symmetric. Motor: increased  tone in the lower greater than upper extremities  Sensory: withdrawal x 4 Coordination: unable to adequately assess due to patient's inability to participate in examination. No dysmetria when reaching for objects. Gait and Station: unable to independently stand and bear weight.   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 No recent diagnostic studies to be reviewed with the patient. Ruvim will be enrolled in the [facility] program. I reviewed the program with Mom, and gave her a binder and my phone number. A care plan will be initiated and updated at each visit.  Plan until next visit: Continue medications and treatments as prescribed  Keep all upcoming specialty appointments Call for questions or concerns Keep appointment with Dr Artis Flock in June as scheduled.   The medication list was reviewed and reconciled. No changes were made in the prescribed medications today. A complete medication list was provided to the patient.  Allergies as of 10/01/2022       Reactions   Depakote [divalproex Sodium] Other (See Comments)   Low Platelets   Lyrica [pregabalin] Other (See Comments)   Sleepiness   Penicillins Hives, Rash   Has patient had a PCN reaction causing immediate rash, facial/tongue/throat swelling, SOB or lightheadedness with hypotension: Unknown Has patient had a PCN reaction causing severe rash involving mucus membranes or skin necrosis: Yes Has patient had a PCN reaction that required hospitalization: No Has patient had a PCN reaction occurring within the last 10 years: No If all of the above answers are "NO", then may proceed with Cephalosporin use.   Topamax [topiramate] Other (See Comments)   Weight Loss   Valproic Acid Other (See Comments)   "WILD MAN"     Low Platelets        Medication List        Accurate as of October 01, 2022  2:07 PM. If you have any questions, ask your nurse or doctor.          budesonide 0.5 MG/2ML nebulizer solution Commonly known as: PULMICORT Take 2 mLs (0.5 mg total) by nebulization daily.   cetirizine 10 MG tablet Commonly known as: ZYRTEC Take 10 mg by mouth daily.   Cholecalciferol 50 MCG (2000 UT) Caps Take 1 tablet by mouth  at bedtime.   ciprofloxacin 0.3 % ophthalmic solution Commonly known as: CILOXAN 5 drops to right ear twice daily   Derma-Smoothe/FS Body 0.01 % Oil Generic drug: Fluocinolone Acetonide Body   doxycycline 100 MG tablet Commonly known as: VIBRA-TABS   DSS 100 MG Caps Take by mouth.   fluconazole 200 MG tablet Commonly known as: DIFLUCAN Take 1 tablet (200 mg total) by mouth daily. Take one tablet at onset of symptoms and one tablet at the end of symptoms   fluticasone 50 MCG/ACT nasal spray Commonly known as: FLONASE Place 1 spray into both nostrils daily.   hydrOXYzine 25 MG tablet Commonly known as: ATARAX Take 1 tablet (25 mg total) by mouth at bedtime as needed (insomnia).   Keppra 1000 MG tablet Generic drug: levETIRAcetam TAKE 1 AND 1/2 TABLETS EVERY MORNING AND TAKE TWO TABLETS AT night   ketoconazole 2 % shampoo Commonly known as: NIZORAL Apply 1 Application topically 2 (two) times a week. Wash face, scalp and body 2 times  weekly, let sit 5 to 10 minutes before washing off   LORazepam 1 MG tablet Commonly known as: ATIVAN Take by mouth.   Oxtellar XR 600 MG Tb24 Generic drug: OXcarbazepine ER Take 3 tablets (1,800 mg total) by mouth at bedtime.   polyethylene glycol 17 g packet Commonly known as: MIRALAX / GLYCOLAX Take 1 Container by mouth daily as needed.   promethazine 25 MG suppository Commonly known as: PHENERGAN Place rectally.   Senna 8.6 MG Caps Take 8.6 mg by mouth daily.   spironolactone 50 MG tablet Commonly known as: ALDACTONE Take 100 mg by mouth daily.   Valtoco 20 MG Dose 2 x 10 MG/0.1ML Lqpk Generic drug: diazePAM (20 MG Dose)   Vimpat 200 MG Tabs tablet Generic drug: lacosamide TAKE 1/2 TABLET BY MOUTH IN THE MORNING AND afternoon AND take ONE full tablet AT night      Total time spent with the patient was 30 minutes, of which 50% or more was spent in counseling and coordination of care. An additional 30 minutes was spent  reviewing the chart and creating a care plan.  [name] [facility] [address], [address] [place], [place] [address] Ph. [phone] Fax [phone]

## 2022-10-01 NOTE — Patient Instructions (Addendum)
It was a pleasure to see you today! Savio will be enrolled in the [facility] Complex [facility] program  Instructions for you until your next appointment are as follows: Follow up with Dr Artis Flock as scheduled Call or text me at [phone] for any questions or concerns Please sign up for MyChart if you have not done so.  Feel free to contact our office during normal business hours at [phone] with questions or concerns. If there is no answer or the call is outside business hours, please leave a message and our clinic staff will call you back within the next business day.  If you have an urgent concern, please stay on the line for our after-hours answering service and ask for the on-call neurologist.     I also encourage you to use MyChart to communicate with me more directly. If you have not yet signed up for MyChart within [facility], the front desk staff can help you. However, please note that this inbox is NOT monitored on nights or weekends, and response can take up to 2 business days.  Urgent matters should be discussed with the on-call pediatric neurologist.   At [facility], we are committed to providing exceptional care. You will receive a patient satisfaction survey through text or email regarding your visit today. Your opinion is important to me. Comments are appreciated.

## 2022-10-02 MED ORDER — VIMPAT 200 MG PO TABS: ORAL_TABLET | ORAL | 5 refills | Status: DC

## 2022-10-02 NOTE — Addendum Note (Signed)
Addended by: Princella Ion on: 10/02/2022 10:07 AM   Modules accepted: Orders

## 2022-10-05 ENCOUNTER — Encounter (INDEPENDENT_AMBULATORY_CARE_PROVIDER_SITE_OTHER): Payer: Self-pay | Admitting: Family

## 2022-10-05 DIAGNOSIS — F819 Developmental disorder of scholastic skills, unspecified: Secondary | ICD-10-CM | POA: Insufficient documentation

## 2022-10-05 DIAGNOSIS — K5900 Constipation, unspecified: Secondary | ICD-10-CM | POA: Insufficient documentation

## 2022-10-05 DIAGNOSIS — N3942 Incontinence without sensory awareness: Secondary | ICD-10-CM | POA: Insufficient documentation

## 2022-10-05 NOTE — Progress Notes (Signed)
    Critical for Continuity of Care - Do Not Delete Zachary [name] DOB [birth year]  Wheelchair weight 12/18/2021 41.8#  Brief History:  History of focal epilepsy s/p VNS insertion, autism spectrum disorder, congenital quadriparesis, obstructive sleep apnea, and intellectual disability. He is taking and tolerating VImpat, Keppra, and Oxtellar XR for seizures.   Guardians/Caregivers: Mother - Pinckney Ferlita - ph [phone]  Baseline Function: Cognitive - intellectual delay Neurologic - seizures, CP, OSA Communication - nonverbal Cardiovascular - Vision - Hearing - Pulmonary - GI - dysphagia, constipation  Urinary - incontinence Motor - wheelchair dependent  Symptom management/Treatments:  Past/failed meds: Topamax discontinued due to weight loss Depakote discontinued due to low platelets  Pregabalin discontinued due to sleepiness  Feeding: table foods chopped. Doesn't tolerate leafy vegetables DME:  fax  Formula:  Current regimen:  Day feeds: mL @  mL/hr x  feeds   Overnight feeds:  mL/hr x  hours from   FWF:   Notes:  Supplements:   Providers: [name], MD (PCP) Daneen Schick, MD ([facility]) ph [phone] fax [phone] Margart Sickles, MD ([facility]) ph [phone] fax [phone] Vinson Moselle, DDS (Dentist)  Bernadene Bell, DDS [facility]) ph [phone] fax [phone] [name], MD [facility]) ph [phone] fax [phone] [name] [facility]) ph [phone] fax [phone] Rolin Giovanni, RD, LDN [facility] Dietitian) Ph. [phone]  Community support/services: Caseworker - Jocelyn Lamer [facility] worker - Maryelizabeth Rowan [facility] Community Advocate - Wanda Plump [facility] Community Coordinator - Deanna Artis Mapp Mom receives Consumer DIrected Care pay - approved for 69 hours/week. Has 104 respite hours To start new day  program - [employer]  Equipment/DME Supplies Providers: Wheelchair  Wall mounted bath seat Pulse oximeter, oxygen - [employer] Incontinence supplies - [facility] bed  Goals of care:  Advanced care planning:  Psychosocial: Lives with his mother Has 2 siblings - brother and sister age 24 years  [name] and [name], MD Pediatric Complex Care Program Ph: [phone] Fax: [phone]

## 2022-10-07 NOTE — Progress Notes (Signed)
Patient: Zachary [name] MRN: [id number] Sex: male DOB: 02/24/1991  Clinical History: Berman is a 32 y.o. with history of intractable epilepsy, now with VNS in place. Has not had EEG in many years, repeat baseline obtained.  Medications: Keppra, Oxtellar, Vimpat.   Procedure: The tracing is carried out on a 32-channel digital Natus recorder, reformatted into 16-channel montages with 1 devoted to EKG.  The patient was awake during the recording.  The international 10/20 system lead placement used.  Recording time 31 minutes.  Recording was done simultaneous with continuous video throughout the entire record.   Description of Findings: Background rhythm is composed of mixed amplitude and frequency.  Patient does not close eyes long enough to determine posterior dominant rythym. Background was moderately organized, continuous and fairly symmetric with no focal slowing.  Drowsiness and sleep were not seen during this recording.    There were occasional muscle and blinking artifacts noted.  Hyperventilation was not completed due to patient status. Photic stimulation using stepwise increase in photic frequency resulted in bilateral symmetric driving response.  Throughout the recording there were no focal or generalized epileptiform activities in the form of spikes or sharps noted. There were no transient rhythmic activities or electrographic seizures noted.  One lead EKG rhythm strip revealed sinus rhythm at a rate of 72 bpm.  Impression: This is an overall normal record with the patient in awake states. No evidence of epileptic activity or decreased seizure threshold.  Background activity without focal slowing or evidence of encephalopathy.  Clinical correlation advised.   [name] MD MPH

## 2022-11-14 ENCOUNTER — Encounter (INDEPENDENT_AMBULATORY_CARE_PROVIDER_SITE_OTHER): Payer: Self-pay | Admitting: Pediatrics

## 2022-11-15 NOTE — Progress Notes (Signed)
Patient: Zachary [name] MRN: [id number] Sex: male DOB: 1991/05/19  Provider: [name], MD Location of Care: Pediatric Specialist- Pediatric Complex Care Note type: Routine return visit  History of Present Illness: History from: patient and prior records Chief Complaint: Complex Care   Zachary [name] is a 32 y.o. male with history of  focal epilepsy s/p VNS insertion, autism spectrum disorder, congenital quadriparesis, obstructive sleep apnea, and intellectual disability who I am seeing in follow-up for complex care management. Patient was last seen 08/20/22 where I refilled AEDs, ordered an EEG, ordered hydroxyzine, and started senna.  Since that appointment, mom reached out to request this appointment before upcoming sleep study.    Patient presents today with his mother who reports the following.   Symptom management:   He does continue to have lots of irritability and agitation with hyperactivity in the evenings. Pulling drinks off the table, pulls sheets off the bed, opening all the doors, pulling towels down. Will scream with frustration. Has pacing and attachment to mom has increased lots. She notes this has started since the switch in his day program a few days ago.   School reports he is more active than he was previously. They have noted that he has gotten more vocal.   Not giving the hydroxyzine at night, as this was not as effective as the lorazepam. She can't remember but may have also stopped due to concern for urinary retention. She reports she is giving 1/2 of a 2 mg tablet. Only gives this PRN (about once a week), but it works well to calm him down. She does 3 mg of melatonin to help him sleep.   Has not had any seizures. Have changed the timing of his medications.   Started Senna, he has some cramping with this. Still has some constipation, uses some colase for constipation and if needed, she will work hard to get him Miralax.   Care coordination (other  providers): He saw Dr. Orlin Hilding for ENT on 08/27/22 at [facility] who recommended possible hypoglossal nerve stimulator at upcoming sleep study. He has been scheduled for an overnight sleep study at [facility] on 12/01/22. Mom reports the one concern they had was that if he were to wake up he would not be able to turn it off himself.   Care management needs:  He has transitioned to [facility] about 2 months ago. She feels this transition has caused some of the restlessness in the evenings.   He will also be transitioning to [facility] in July.   Equipment needs:  He has a pulse ox machine and O2 when he has a seizure or if he is gasping for air. She feels this may be an anxiety reaction and the O2 soothes him. He also has a wheelchiar, hospital bed, ensure formula, incontinence supplies.   Diagnostics/Patient history:  Patient History:  Age at seizure onset: 32 years of age  Description of all seizure types and duration: Semiology #1: Generalization tonic-clonic lasting up to 2 minutes in duration.  Semiology #2: Focal seizures with hiccup noises, slight tremble of hand, and unresponsiveness that stop with VNS magnet activation.   Complications from seizures (trauma, etc.): He has broken his left shoulder from seizure.  h/o status epilepticus: No   Date of most recent seizure: 03/01/22   Seizure frequency past month (exact number or average per day): 2 Past 3 months: 2   Current AEDs and Current side effects: Keppra 1500 mg BID  Oxtellar XR 1800 mg daily at night  Vimpat 100 mg in morning, 100 mg at 3 pm and 400 mg at bedtime.    Prior AEDs (d/c reason?):  Topamax discontinued due to weight loss Depakote discontinued due to low platelets  Pregabalin discontinued due to sleepiness   EEG 10/01/22 Impression: This is an overall normal record with the patient in awake states. No evidence of epileptic activity or decreased seizure threshold.  Background activity  without focal slowing or evidence of encephalopathy.  Clinical correlation advised.   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 Theressa Stamps. Patient's AHI was 39.6 RDI is 43.6 positional component. Patient will need desensitization and a gentle approach to CPAP titration.    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 .     Surgical History Past Surgical History:  Procedure Laterality Date   FOOT SURGERY     FRACTURE SURGERY     HIP SURGERY     IMPLANTATION VAGAL NERVE STIMULATOR     Pediatomy  06/05/2007   SHOULDER SURGERY     TONSILLECTOMY  06/05/2007   TYMPANOSTOMY TUBE PLACEMENT  06/05/2007    Family History family history includes Autism in an other family member; Cancer in his paternal grandmother; Diabetes in his paternal grandfather.   Social History Social History   Social History Narrative   Zachary [name] attends [employer] [facility] [employer] Monday through Friday    He lives with his mother, maternal grandmother, and mom has a client who lives in the home as an AFL    He enjoys swimming and going to [employer].    Allergies Allergies  Allergen Reactions   Depakote [Divalproex Sodium] Other (See Comments)    Low Platelets   Lyrica [Pregabalin] Other (See Comments)    Sleepiness   Topamax [Topiramate] Other (See Comments)    Weight Loss   Valproic Acid Other (See Comments)    "WILD MAN"     Low Platelets   Penicillins Rash    Has patient had a PCN reaction causing immediate rash, facial/tongue/throat swelling, SOB or lightheadedness with hypotension: Unknown Has patient had a PCN  reaction causing severe rash involving mucus membranes or skin necrosis: Yes Has patient had a PCN reaction that required hospitalization: No Has patient had a PCN reaction occurring within the last 10 years: No If all of the above answers are "NO", then may proceed with Cephalosporin use.     Medications Current Outpatient Medications on File Prior to Visit  Medication Sig Dispense Refill   acetaminophen (TYLENOL) 325 MG tablet Take 325 mg by mouth every 6 (six) hours as needed. Take 650 mg by mouth every 6 (six) hours as needed for mild pain (1-3).     budesonide (PULMICORT) 0.5 MG/2ML nebulizer solution Take 2 mLs (0.5 mg total) by nebulization daily. 60 mL 12   cetirizine (ZYRTEC) 10 MG tablet Take 10 mg by mouth daily.     Cholecalciferol 50 MCG (2000 UT) CAPS Take 1 tablet by mouth at bedtime.     diazePAM, 20 MG  Dose, (VALTOCO 20 MG DOSE) 2 x 10 MG/0.1ML LQPK      Docusate Sodium (DSS) 100 MG CAPS Take by mouth.     Fluocinolone Acetonide Body (DERMA-SMOOTHE/FS BODY) 0.01 % OIL      fluticasone (FLONASE) 50 MCG/ACT nasal spray Place 1 spray into both nostrils daily.     KEPPRA 1000 MG tablet TAKE 1 AND 1/2 TABLETS EVERY MORNING AND TAKE TWO TABLETS AT night 105 tablet 5   ketoconazole (NIZORAL) 2 % shampoo Apply 1 Application topically 2 (two) times a week. Wash face, scalp and body 2 times weekly, let sit 5 to 10 minutes before washing off 120 mL 6   melatonin 3 MG TABS tablet Take 3 mg by mouth at bedtime.     polyethylene glycol (MIRALAX / GLYCOLAX) 17 g packet Take 1 Container by mouth daily as needed.     promethazine (PHENERGAN) 25 MG suppository Place rectally.     Sennosides (SENNA) 8.6 MG CAPS Take 8.6 mg by mouth daily. 30 capsule 11   spironolactone (ALDACTONE) 50 MG tablet Take 100 mg by mouth daily.     VIMPAT 200 MG TABS tablet TAKE 1/2 TABLET in THE MORNING AND afternoon AND TAKE ONE TABLET AT night 60 tablet 5   ciprofloxacin (CILOXAN) 0.3 % ophthalmic solution 5  drops to right ear twice daily (Patient not taking: Reported on 11/22/2022)     doxycycline (VIBRA-TABS) 100 MG tablet  (Patient not taking: Reported on 11/22/2022)     No current facility-administered medications on file prior to visit.   The medication list was reviewed and reconciled. All changes or newly prescribed medications were explained.  A complete medication list was provided to the patient/caregiver.  Physical Exam BP 120/80   Pulse 80   Resp 20   Wt 186 lb (84.4 kg)   SpO2 97%   BMI 31.93 kg/m  Weight for age: Facility age limit for growth %iles is 20 years.  Length for age: Facility age limit for growth %iles is 20 years. BMI: Body mass index is 31.93 kg/m. No results found. Gen: well appearing neuroaffected child Skin: No rash, No neurocutaneous stigmata. HEENT: Normocephalic, no dysmorphic features, no conjunctival injection, nares patent, mucous membranes moist, oropharynx clear.  Neck: Supple, no meningismus. No focal tenderness. Resp: Clear to auscultation bilaterally CV: Regular rate, normal S1/S2, no murmurs, no rubs Abd: BS present, abdomen soft, non-tender, non-distended. No hepatosplenomegaly or mass Ext: Warm and well-perfused. No deformities, no muscle wasting, ROM full.  Neurological Examination: MS: Awake, alert.  Nonverbal, but interactive, reacts appropriately to conversation.   Cranial Nerves: Pupils were equal and reactive to light;  No clear visual field defect, no nystagmus; no ptsosis, face symmetric with full strength of facial muscles, hearing grossly intact, palate elevation is symmetric. Motor-Low core tone, increased extremity tone.Moves extremities at least antigravity. No abnormal movements Reflexes- Reflexes 2+ and symmetric in the biceps, triceps, patellar and achilles tendon. Plantar responses flexor bilaterally, no clonus noted Sensation: Responds to touch in all extremities.  Coordination: Does not reach for objects.  Gait: wheelchair  dependent   Diagnosis:  1. Fracture shoulder   2. 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 male with history of  focal epilepsy s/p VNS insertion, autism spectrum disorder, congenital quadriparesis, obstructive sleep apnea, and intellectual disability who presents for follow-up in the pediatric complex care clinic.  Patient seen by  case Production designer, theatre/television/film, dietician, integrated behavioral health today as well, please see accompanying notes.  I discussed case with all involved parties for coordination of care and recommend patient follow their instructions as below.   Symptom management:  Patient with restlessness and destructive behaviors, particularly in the evenings. Mother has been giving lorazepam to treat, discussed with her that I would not like him to take this medication long term and become reliant on it. Events seem related to anxiety and stress. To treat, recommend trazodone 50 mg q night. I also recommend she continue to give him melatonin every night to help him calm down. With this regimen, if he continues to have occasional outbursts, recommend treating with hydroxyzine 25-50 mg PRN.   Patient also has some muscle cramping, which is likely related to Senna. Recommend decreasing dose of this to prevent pain. Recommend continuing regular colace dosing as well to treat constipation.   No seizures since the last visit, will continue on current AED regimen. Checked VNS status today and continued current settings. Mom reports some difficulty getting brand name Keppra. Advised her that I would be comfortable switching him to generic medication, as long as he were to remain on the same brand, could adjust dose PRN.   - Start Trazodone 50 mg at night  - Continue Melatonin  - Decrease Senna   Care coordination: - Reviewed all specialty providers with mom today. See full list in care plan.   Care management  needs:  - Discussed plan to transition to [facility] in July, advised mom that we will still be able to see him. If  Equipment needs:  - Due to patient's medical condition, patient is indefinitely incontinent of stool and urine.  It is medically necessary for them to use diapers, underpads, and gloves to assist with hygiene and skin integrity.  They require a frequency of up to 200 a month.   Decision making/Advanced care planning: - Not addressed at this visit, patient remains at full code.    The CARE PLAN for reviewed and revised to represent the changes above.  This is available in Epic under snapshot, and a physical binder provided to the patient, that can be used for anyone providing care for the patient.   I spent 85 minutes on day of service on this patient including review of chart, discussion with patient and family, discussion of screening results, coordination with other providers and management of orders and paperwork.     Return in about 6 months (around 05/24/2023).  I, Mayra Reel, scribed for and in the presence of [name], MD at today's visit on 11/22/2022.   I, [name] MD MPH, personally performed the services described in this documentation, as scribed by Mayra Reel in my presence on 11/22/2022 and it is accurate, complete, and reviewed by me.    [name] MD MPH Neurology,  Neurodevelopment and Neuropalliative care [facility] Child Neurology  [address] [place], [place], [place] [address] Phone: [phone] Fax: [phone]

## 2022-11-22 ENCOUNTER — Ambulatory Visit (INDEPENDENT_AMBULATORY_CARE_PROVIDER_SITE_OTHER): Payer: Medicaid Other | Admitting: Pediatrics

## 2022-11-22 ENCOUNTER — Ambulatory Visit (INDEPENDENT_AMBULATORY_CARE_PROVIDER_SITE_OTHER): Payer: Self-pay | Admitting: Pediatrics

## 2022-11-22 ENCOUNTER — Encounter (INDEPENDENT_AMBULATORY_CARE_PROVIDER_SITE_OTHER): Payer: Self-pay | Admitting: Pediatrics

## 2022-11-22 VITALS — BP 120/80 | HR 80 | Resp 20 | Wt 186.0 lb

## 2022-11-22 DIAGNOSIS — G808 Other cerebral palsy: Secondary | ICD-10-CM

## 2022-11-22 DIAGNOSIS — F819 Developmental disorder of scholastic skills, unspecified: Secondary | ICD-10-CM

## 2022-11-22 DIAGNOSIS — G40209 Localization-related (focal) (partial) symptomatic epilepsy and epileptic syndromes with complex partial seizures, not intractable, without status epilepticus: Secondary | ICD-10-CM | POA: Diagnosis not present

## 2022-11-22 DIAGNOSIS — F840 Autistic disorder: Secondary | ICD-10-CM | POA: Diagnosis not present

## 2022-11-22 DIAGNOSIS — T148XXA Other injury of unspecified body region, initial encounter: Secondary | ICD-10-CM | POA: Diagnosis not present

## 2022-11-22 DIAGNOSIS — G4733 Obstructive sleep apnea (adult) (pediatric): Secondary | ICD-10-CM

## 2022-11-22 MED ORDER — HYDROXYZINE HCL 25 MG PO TABS: 25.0000 mg | ORAL_TABLET | Freq: Every evening | ORAL | 3 refills | Status: DC | PRN

## 2022-11-22 MED ORDER — TRAZODONE HCL 50 MG PO TABS: 50.0000 mg | ORAL_TABLET | Freq: Every day | ORAL | 5 refills | Status: DC

## 2022-11-22 MED ORDER — OXTELLAR XR 600 MG PO TB24
ORAL_TABLET | ORAL | 5 refills | Status: DC
Start: 2022-11-22 — End: 2023-10-03

## 2022-11-22 MED ORDER — SENNA 8.8 MG/5ML PO LIQD: 2.5000 mL | Freq: Every day | ORAL | 3 refills | Status: DC

## 2022-11-22 NOTE — Patient Instructions (Addendum)
Start 50 mg Trazodone with his night medications. This is a better long term medication than the lorazepam.  Also give him the melatonin earlier, with all of his other night medications.  Decrease his Senna to 2.5 mL which is half of what he is getting now.  I would like to get his medications to a point where he sleeps through the night.  We will find a provider to replace his VNS batteries when it is time.  If you are interested in getting him to sleep through the night, you can look into vests or even sew tennis balls into his t-shirt.    If you are having trouble getting his Keppra, I am comfortable switching to generic. If he has any seizures, I would increase it to 2 tablets twice a day and I would be comfortable with this.   Azari's Medication Schedule Medicine 8:30 am  3 pm 8:00 pm  Keppra  1 1/2 tablets    2 tablets   Vimpat  1/2 tablet 1/2 tablet 1 tablet  Oxtellar      3 tablets   Spironolactone   1 tablet    Cetirizine      1 tablet  Flonase      2 sprays (one in each nostril)   Vit D      1 tablet   Senna  2.5 mL       Trazodone   1 tablet  Melatonin   1 tablet

## 2022-11-29 ENCOUNTER — Ambulatory Visit (INDEPENDENT_AMBULATORY_CARE_PROVIDER_SITE_OTHER): Payer: Medicaid Other | Admitting: Pediatrics

## 2022-11-29 DIAGNOSIS — H6692 Otitis media, unspecified, left ear: Secondary | ICD-10-CM

## 2022-11-29 MED ORDER — CEFTRIAXONE SODIUM 500 MG IJ SOLR
500.0000 mg | Freq: Once | INTRAMUSCULAR | Status: AC
Start: 2022-11-29 — End: 2022-11-29
  Administered 2022-11-29: 500 mg via INTRAMUSCULAR

## 2022-11-29 MED ORDER — AMOXICILLIN-POT CLAVULANATE 500-125 MG PO TABS: 500.0000 mg | ORAL_TABLET | Freq: Two times a day (BID) | ORAL | 3 refills | Status: AC

## 2022-11-30 ENCOUNTER — Encounter (INDEPENDENT_AMBULATORY_CARE_PROVIDER_SITE_OTHER): Payer: Self-pay | Admitting: Pediatrics

## 2022-11-30 ENCOUNTER — Encounter: Payer: Self-pay | Admitting: Pediatrics

## 2022-11-30 DIAGNOSIS — H6692 Otitis media, unspecified, left ear: Secondary | ICD-10-CM | POA: Insufficient documentation

## 2022-11-30 NOTE — Patient Instructions (Signed)
Antibiotic Medicine, Adult  Antibiotic medicines are used to treat infections caused by bacteria. These medicines do not work for illnesses caused by viruses. Antibiotics work by killing the bacteria that are making you sick, but they can also have serious side effects. Antibiotics must be used safely and only when needed. When do I need to take antibiotics? You may need antibiotics for: A urinary tract infection (UTI). Strep throat. Bacterial sinus infection. Meningitis. Serious lung infections. Your health care provider may start you on antibiotics while you are waiting for test results. Tests may include a culture of the throat, urine, blood, or mucus. Your health care provider may change or stop your antibiotic depending on your test results. When are antibiotics not needed? You do not need antibiotics for most common illnesses. These illnesses may be caused by a virus, not by bacteria. You do not need antibiotics for: The common cold. The flu (influenza). Sore throat. Discolored mucus. Bronchitis. Antibiotics are not always needed for all infections caused by bacteria. Many of these infections clear up on their own. Do not take antibiotics when they are not needed. How long should I take my antibiotic? You must take the entire amount prescribed to you. Take your antibiotics as told by your health care provider. Do not stop taking your antibiotics even if you start to feel better. If you stop taking them too soon: You may feel sick again. Your infection may get harder to treat. Each course of antibiotics needs a different length of time to work. The length of time may vary from a few days to a few weeks. What if I miss a dose? Try not to miss any doses of medicine. If you miss a dose, call your health care provider or pharmacist for help. Sometimes, it is okay to take the missed dose as soon as possible. Do not take double or extra doses. What are the risks of taking  antibiotics? Antibiotics can cause: Allergic reactions. Nausea. Yeast infections. Liver problems. Antibiotics can also cause an infection called Clostridioides difficile (C. difficile or C. diff), which causes severe diarrhea. This infection happens when the antibiotics kill the healthy bacteria in your intestines. This allows C. diff to grow. C. diff needs to be treated right away. Let your health care provider know if: You have diarrhea while taking an antibiotic. You have diarrhea after you stop taking an antibiotic. C. diff infection can start weeks after stopping the antibiotic. Taking an antibiotic also puts you at risk for getting sick in the future with bacteria that do not respond to medicine (antibiotic-resistant infection). Antibiotics can cause bacteria to change so that if the antibiotic is taken again, the medicine cannot kill the bacteria. These infections can be more serious because they are hard, or sometimes impossible, to treat. Do antibiotics affect birth control? Birth control pills may not work while you are taking antibiotics. If you are taking birth control pills: Keep taking them as usual. Use a second form of birth control, such as a condom, to avoid unwanted pregnancy. Do this for as long as told by your health care provider. What else should I know about taking antibiotics?  Take antibiotics exactly as told. Take the correct amount of medicine at the same time each day. Ask your health care provider: How long to wait between doses. If you should take your antibiotic with food or water. If you should avoid certain foods, drinks, or medicines while taking your antibiotics. If you need to watch for  any side effects. Use only the antibiotics prescribed to you by your health care provider. Do not use antibiotics prescribed for someone else. Drink a large glass of water when taking your antibiotics unless told otherwise. Drink enough fluid to keep your urine pale  yellow. Ask your pharmacist for a dosage syringe, cup, or spoon that correctly measures your antibiotics. Ask your pharmacist or health care provider how to safely get rid of leftover medicine. Follow these instructions at home: Take your antibiotics as told by your health care provider. Do not stop taking your antibiotics even if you start to feel better. Return to your normal activities as told by your health care provider. Ask your health care provider what activities are safe for you. Contact a health care provider if: Your symptoms get worse. You have new joint pain or muscle aches that begin after starting your antibiotic. You have side effects from your antibiotic, such as: Stomach pain. Diarrhea. Nausea. White patches in your mouth or throat. Get help right away if: You have signs of a severe allergic reaction to antibiotics. If you have any of these signs, stop taking the antibiotic right away. Signs may include: Raised, itchy, red bumps on your skin (hives). Skin rash. Trouble breathing. High-pitched whistling sounds when you breathe, most often when you breathe out (wheezing). Swelling anywhere on your body. Feeling dizzy. Vomiting. You have signs of liver problems, such as: Dark or blood-colored urine. Yellow color to your skin. Bruising or bleeding easily. You have severe diarrhea, bloody diarrhea, or stomach cramps. You have a severe headache. These symptoms may be an emergency. Get help right away. Call 911. Do not wait to see if the symptoms will go away. Do not drive yourself to the hospital. This information is not intended to replace advice given to you by your health care provider. Make sure you discuss any questions you have with your health care provider. Document Revised: 12/19/2021 Document Reviewed: 12/19/2021 Elsevier Patient Education  2024 [employer].

## 2022-11-30 NOTE — Progress Notes (Signed)
Subjective   [name], 32 y.o.  non verbal male, with severe developmental delay and seizure disorder who presents with bilateral ear pain, congestion, irritability, and banging at both ears .  Symptoms started 2 days ago.  He is taking fluids well.  There are no other significant complaints.  The patient's history has been marked as reviewed and updated as appropriate.  Objective   There were no vitals taken for this visit.  General appearance:  well hydrated, fretful, and non verbal with hitting both ears as if in pain  Nasal: Neck:  Mild nasal congestion with clear rhinorrhea Neck is supple  Ears:  External ears are normal Right TM - erythematous, dull, and bulging Left TM - erythematous, dull, and bulging  Oropharynx:  Mucous membranes are moist; there is mild erythema of the posterior pharynx  Lungs:  Lungs are clear to auscultation  Heart:  Regular rate and rhythm; no murmurs or rubs  Skin:  No rashes or lesions noted   Assessment   Acute bilateral otitis media  Plan   1) Antibiotics per orders  Meds ordered this encounter  Medications   amoxicillin-clavulanate (AUGMENTIN) 500-125 MG tablet    Sig: Take 1 tablet by mouth 2 (two) times daily for 10 days.    Dispense:  20 tablet    Refill:  3   cefTRIAXone (ROCEPHIN) injection 500 mg    2) Fluids, acetaminophen as needed 3) Recheck if symptoms persist for 2 or more days, symptoms worsen, or new symptoms develop.

## 2022-12-02 ENCOUNTER — Encounter: Payer: Self-pay | Admitting: Pediatrics

## 2022-12-03 ENCOUNTER — Encounter (INDEPENDENT_AMBULATORY_CARE_PROVIDER_SITE_OTHER): Payer: Self-pay | Admitting: Pediatrics

## 2022-12-03 MED ORDER — SULFAMETHOXAZOLE-TRIMETHOPRIM 800-160 MG PO TABS: 1.0000 | ORAL_TABLET | Freq: Two times a day (BID) | ORAL | 0 refills | Status: AC

## 2022-12-09 ENCOUNTER — Encounter (INDEPENDENT_AMBULATORY_CARE_PROVIDER_SITE_OTHER): Payer: Self-pay | Admitting: Pediatrics

## 2022-12-19 ENCOUNTER — Encounter: Payer: Self-pay | Admitting: Pediatrics

## 2022-12-20 ENCOUNTER — Telehealth: Payer: Self-pay | Admitting: Pediatrics

## 2022-12-20 NOTE — Telephone Encounter (Signed)
Zachary [name] has a diaper rash on left scrotum and in the crease of his leg. Can I get you to send a physician order to his school to reapply Zeasorb and Destin are directed, to these areas when they change him. It's looking better. They need an order signed by his MD. There fax number is [phone]. Thank you.

## 2022-12-20 NOTE — Telephone Encounter (Signed)
Kenzel has a diaper rash on left scrotum and in the crease of his leg. Can I get you to send a physician order to his school to reapply Zeasorb and Destin are directed, to these areas when they change him. It's looking better. They need an order signed by his MD. There fax number is [phone]. Thank you.

## 2022-12-22 ENCOUNTER — Encounter: Payer: Self-pay | Admitting: Pediatrics

## 2022-12-22 MED ORDER — FLUCONAZOLE 100 MG PO TABS: 100.0000 mg | ORAL_TABLET | Freq: Every day | ORAL | 3 refills | Status: AC

## 2022-12-22 MED ORDER — KETOCONAZOLE 2 % EX CREA: 1.0000 | TOPICAL_CREAM | Freq: Every day | CUTANEOUS | 3 refills | Status: AC

## 2023-01-01 ENCOUNTER — Other Ambulatory Visit (INDEPENDENT_AMBULATORY_CARE_PROVIDER_SITE_OTHER): Payer: Self-pay | Admitting: Family

## 2023-01-01 DIAGNOSIS — G40209 Localization-related (focal) (partial) symptomatic epilepsy and epileptic syndromes with complex partial seizures, not intractable, without status epilepticus: Secondary | ICD-10-CM

## 2023-01-17 ENCOUNTER — Ambulatory Visit (INDEPENDENT_AMBULATORY_CARE_PROVIDER_SITE_OTHER): Payer: Medicaid Other | Admitting: Pediatrics

## 2023-02-07 ENCOUNTER — Telehealth (INDEPENDENT_AMBULATORY_CARE_PROVIDER_SITE_OTHER): Payer: Self-pay | Admitting: Pediatrics

## 2023-02-07 ENCOUNTER — Ambulatory Visit (INDEPENDENT_AMBULATORY_CARE_PROVIDER_SITE_OTHER): Payer: Medicaid Other | Admitting: Pediatrics

## 2023-02-07 NOTE — Telephone Encounter (Signed)
Mom came in to have form filled out for stopped medication. She will come & p/u form once it's  completed. Ppwk has been place in Dr. Blair Heys box

## 2023-02-08 ENCOUNTER — Encounter (INDEPENDENT_AMBULATORY_CARE_PROVIDER_SITE_OTHER): Payer: Self-pay | Admitting: Pediatrics

## 2023-02-08 NOTE — Telephone Encounter (Signed)
Paper work is completed! Mom is coming to pick it up

## 2023-03-01 ENCOUNTER — Telehealth: Payer: Self-pay | Admitting: Pediatrics

## 2023-03-01 MED ORDER — PREDNISONE 20 MG PO TABS: 20.0000 mg | ORAL_TABLET | Freq: Two times a day (BID) | ORAL | 0 refills | Status: AC

## 2023-03-01 MED ORDER — CEFDINIR 300 MG PO CAPS: 300.0000 mg | ORAL_CAPSULE | Freq: Two times a day (BID) | ORAL | 0 refills | Status: DC

## 2023-03-01 NOTE — Telephone Encounter (Signed)
Spoke with mom and called in oral steroids and omnicef for possible ear/chest infection---due to weather conditions she would not be able to bring him in today. I told mom if he is not improving that I would see him in the office tomorrow. Mom expressed understanding.

## 2023-03-06 ENCOUNTER — Encounter (INDEPENDENT_AMBULATORY_CARE_PROVIDER_SITE_OTHER): Payer: Self-pay

## 2023-03-06 DIAGNOSIS — G40319 Generalized idiopathic epilepsy and epileptic syndromes, intractable, without status epilepticus: Secondary | ICD-10-CM

## 2023-03-06 MED ORDER — VIMPAT 200 MG PO TABS
ORAL_TABLET | ORAL | 5 refills | Status: DC
Start: 2023-03-06 — End: 2023-09-02

## 2023-03-29 ENCOUNTER — Other Ambulatory Visit: Payer: Self-pay | Admitting: Dermatology

## 2023-03-29 DIAGNOSIS — L739 Follicular disorder, unspecified: Secondary | ICD-10-CM

## 2023-04-03 ENCOUNTER — Ambulatory Visit (INDEPENDENT_AMBULATORY_CARE_PROVIDER_SITE_OTHER): Payer: MEDICAID | Admitting: Dermatology

## 2023-04-03 ENCOUNTER — Ambulatory Visit: Payer: MEDICAID | Admitting: Dermatology

## 2023-04-03 DIAGNOSIS — L700 Acne vulgaris: Secondary | ICD-10-CM | POA: Diagnosis not present

## 2023-04-03 DIAGNOSIS — L209 Atopic dermatitis, unspecified: Secondary | ICD-10-CM | POA: Diagnosis not present

## 2023-04-03 DIAGNOSIS — Z79899 Other long term (current) drug therapy: Secondary | ICD-10-CM

## 2023-04-03 DIAGNOSIS — L2089 Other atopic dermatitis: Secondary | ICD-10-CM

## 2023-04-03 DIAGNOSIS — L738 Other specified follicular disorders: Secondary | ICD-10-CM

## 2023-04-03 DIAGNOSIS — L739 Follicular disorder, unspecified: Secondary | ICD-10-CM

## 2023-04-03 DIAGNOSIS — Z7189 Other specified counseling: Secondary | ICD-10-CM

## 2023-04-03 MED ORDER — DOXYCYCLINE MONOHYDRATE 100 MG PO CAPS: ORAL_CAPSULE | ORAL | 5 refills | Status: AC

## 2023-04-03 MED ORDER — KETOCONAZOLE 2 % EX SHAM
MEDICATED_SHAMPOO | CUTANEOUS | 6 refills | Status: DC
Start: 2023-04-04 — End: 2024-01-30

## 2023-04-03 MED ORDER — FLUOCINOLONE ACETONIDE BODY 0.01 % EX OIL: TOPICAL_OIL | CUTANEOUS | 5 refills | Status: DC

## 2023-04-03 NOTE — Progress Notes (Signed)
   Follow-Up Visit   Subjective  Zachary [name] is a 32 y.o. male who presents for the following: One year follow up for folliculitis of the face, chest, and back. Pt currently using Doxycycline 100 mg po QD PRN flares. Mother has noticed patient scratching around his chest and neck area. He also uses Derma-Smoothe oil PRN for eczema.   Accompanied by mother  The following portions of the chart were reviewed this encounter and updated as appropriate:   Tobacco  Allergies  Meds  Problems  Med Hx  Surg Hx  Fam Hx     Review of Systems:  No other skin or systemic complaints except as noted in HPI or Assessment and Plan.  Objective  Well appearing patient in no apparent distress; mood and affect are within normal limits.  A focused examination was performed including scalp, face, back. Relevant physical exam findings are noted in the Assessment and Plan.    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 It can be infectious (bacterial, fungal) or noninfectious (shaving, tight clothing, heat/sweat, medications).  Folliculitis can be acute or chronic and recommended treatment depends on the underlying cause of folliculitis.  Treatment Plan: Continue Doxycycline 100 mg 1 po qd with food and plenty of fluid x 7 days prn flares, CLN wash 2-5 times per week, Ketoconazole 2% shampoo 2 times per week  ATOPIC DERMATITIS Exam: Scaly pink papules coalescing to plaques  Chronic and persistent condition with duration or expected duration over one year. Condition is symptomatic/ bothersome to patient. Not currently at goal.   Atopic dermatitis (eczema) is a chronic, relapsing, pruritic condition that can significantly affect quality of life. It is often associated with  allergic rhinitis and/or asthma and can require treatment with topical medications, phototherapy, or in severe cases biologic injectable medication (Dupixent; Adbry) or Oral JAK inhibitors.  Treatment Plan: Continue Derma-Smoothe FS oil QD-BID up to 5d/qk PRN  Recommend gentle skin care.  ACNE VULGARIS Exam: Open comedones and inflammatory papules of the face.   Chronic and persistent condition with duration or expected duration over one year. Condition is bothersome/symptomatic for patient. Currently flared.  Treatment Plan: Continue Doxycycline 100 mg 1 po qd with food and plenty of fluid x 7 days prn flares, CLN wash 2-5 times per week, Ketoconazole 2% shampoo 2 times per week  Discussed Isotretinoin.   Return in about 1 year (around 04/02/2024) for follow up.  Maylene Roes, CMA, am acting as scribe for Armida Sans, MD .  Documentation: I have reviewed the above documentation for accuracy and completeness, and I agree with the above.  Armida Sans, MD

## 2023-04-03 NOTE — Patient Instructions (Signed)

## 2023-04-13 ENCOUNTER — Encounter: Payer: Self-pay | Admitting: Dermatology

## 2023-04-23 ENCOUNTER — Encounter (INDEPENDENT_AMBULATORY_CARE_PROVIDER_SITE_OTHER): Payer: Self-pay

## 2023-04-23 DIAGNOSIS — F458 Other somatoform disorders: Secondary | ICD-10-CM

## 2023-04-23 DIAGNOSIS — R451 Restlessness and agitation: Secondary | ICD-10-CM

## 2023-04-25 MED ORDER — DIAZEPAM 2 MG PO TABS
ORAL_TABLET | ORAL | 0 refills | Status: DC
Start: 2023-04-25 — End: 2023-08-16

## 2023-04-25 NOTE — Telephone Encounter (Signed)
I called Mom. She said that Zachary [name] has been in a new day program for about a month and has been agitated upon arrival at home each day. He hits himself in the head and has been grinding his teeth excessively. Mom has tried various comfort measures such as giving him a snack, massaging his face and jaws, treating him for a headache without success. She gave him Lorazepam once and it helped him to calm down and stop hitting himself. I recommended a trial of Diazepam as soon as he arrives home from school and Mom agreed. I asked her to let me know next week how he is doing. TG

## 2023-05-02 ENCOUNTER — Encounter (INDEPENDENT_AMBULATORY_CARE_PROVIDER_SITE_OTHER): Payer: Self-pay

## 2023-05-02 DIAGNOSIS — G40209 Localization-related (focal) (partial) symptomatic epilepsy and epileptic syndromes with complex partial seizures, not intractable, without status epilepticus: Secondary | ICD-10-CM

## 2023-05-02 MED ORDER — LEVETIRACETAM 1000 MG PO TABS
ORAL_TABLET | ORAL | 0 refills | Status: DC
Start: 2023-05-02 — End: 2023-05-24

## 2023-05-02 NOTE — Telephone Encounter (Signed)
The prescription was sent to the [facility] pharmacy as per guardian request.

## 2023-05-05 ENCOUNTER — Encounter: Payer: Self-pay | Admitting: Pediatrics

## 2023-05-07 ENCOUNTER — Telehealth: Payer: Self-pay | Admitting: Pediatrics

## 2023-05-07 MED ORDER — HYDROXYZINE HCL 25 MG PO TABS: 25.0000 mg | ORAL_TABLET | Freq: Two times a day (BID) | ORAL | 0 refills | Status: DC

## 2023-05-07 NOTE — Telephone Encounter (Signed)
Called in hydroxyzine 

## 2023-05-07 NOTE — Telephone Encounter (Signed)
Mother called and stated that Zachary [name] has had a cold and has an awful cough. Mother stated that she would like a cough medication to be sent to the pharmacy.  Jenetta Downer and they close at 6:00 pm.

## 2023-05-09 ENCOUNTER — Other Ambulatory Visit: Payer: Self-pay | Admitting: Pediatrics

## 2023-05-09 ENCOUNTER — Ambulatory Visit
Admission: RE | Admit: 2023-05-09 | Discharge: 2023-05-09 | Disposition: A | Discharge status: Home / Self Care | Payer: MEDICAID | Source: Ambulatory Visit | Attending: Pediatrics | Admitting: Pediatrics

## 2023-05-09 ENCOUNTER — Ambulatory Visit (INDEPENDENT_AMBULATORY_CARE_PROVIDER_SITE_OTHER): Payer: MEDICAID | Admitting: Pediatrics

## 2023-05-09 VITALS — Temp 98.7°F | Wt 187.0 lb

## 2023-05-09 DIAGNOSIS — F819 Developmental disorder of scholastic skills, unspecified: Secondary | ICD-10-CM | POA: Diagnosis not present

## 2023-05-09 DIAGNOSIS — G808 Other cerebral palsy: Secondary | ICD-10-CM | POA: Diagnosis not present

## 2023-05-09 DIAGNOSIS — G40319 Generalized idiopathic epilepsy and epileptic syndromes, intractable, without status epilepticus: Secondary | ICD-10-CM

## 2023-05-09 DIAGNOSIS — R051 Acute cough: Secondary | ICD-10-CM

## 2023-05-09 DIAGNOSIS — J019 Acute sinusitis, unspecified: Secondary | ICD-10-CM

## 2023-05-09 DIAGNOSIS — B9689 Other specified bacterial agents as the cause of diseases classified elsewhere: Secondary | ICD-10-CM

## 2023-05-09 MED ORDER — PREDNISONE 20 MG PO TABS: 20.0000 mg | ORAL_TABLET | Freq: Two times a day (BID) | ORAL | 0 refills | Status: DC

## 2023-05-09 MED ORDER — HYDROXYZINE HCL 25 MG PO TABS: 25.0000 mg | ORAL_TABLET | Freq: Two times a day (BID) | ORAL | 4 refills | Status: AC

## 2023-05-09 MED ORDER — AZITHROMYCIN 250 MG PO TABS: ORAL_TABLET | ORAL | 0 refills | Status: AC

## 2023-05-09 MED ORDER — ALBUTEROL SULFATE (2.5 MG/3ML) 0.083% IN NEBU: 2.5000 mg | INHALATION_SOLUTION | Freq: Four times a day (QID) | RESPIRATORY_TRACT | 12 refills | Status: AC | PRN

## 2023-05-12 ENCOUNTER — Encounter: Payer: Self-pay | Admitting: Pediatrics

## 2023-05-12 DIAGNOSIS — B9689 Other specified bacterial agents as the cause of diseases classified elsewhere: Secondary | ICD-10-CM | POA: Insufficient documentation

## 2023-05-12 NOTE — Patient Instructions (Signed)

## 2023-05-12 NOTE — Progress Notes (Signed)
32 year old with non verbal developmental delay, seizures and cerebral palsy who resents  with nasal congestion, cough and nasal discharge off and on for the past two weeks. Mom says he is also having fever X 2 days and now has thick green mucoid nasal discharge. Cough is keeping him up at night and he has decreased appetite.   Chest X ray done today was negative for any pulmonary disease.   Some post tussive vomiting but no diarrhea, no rash and no wheezing. Symptoms are persistent (>10 days), Severe (affecting sleep and feeding) and Severe (associated fever).    Review of Systems  Constitutional:  Negative for chills, positive activity change and appetite change.  HENT:  Negative for  trouble swallowing, voice change and ear discharge.   Eyes: Negative for discharge, redness and itching.  Respiratory:  Negative for  wheezing.   Cardiovascular: Negative for chest pain.  Gastrointestinal: Negative for vomiting and diarrhea.  Musculoskeletal: Negative for arthralgias.  Skin: Negative for rash.  Neurological: Negative for weakness.       Objective:   Physical Exam  Constitutional: Appears  well-nourished.   HENT:  Ears: Both TM's normal Nose: Profuse clear nasal discharge.  Mouth/Throat: unable to examine  Eyes: Pupils are equal, round, and reactive to light.  Neck: Normal range of motion.  Cardiovascular: Regular rhythm.  No murmur heard. Pulmonary/Chest: Effort normal and breath sounds normal. No nasal flaring. No respiratory distress. No wheezes with  no retractions.  Abdominal: Soft. Bowel sounds are normal. No distension and no tenderness.   Neurological: Baseline mental status --uncooperative --non verbal Skin: Skin is warm and moist. No rash noted.       Assessment:      Sinusitis--bacterial  Plan:     Will treat with oral antibiotics and follow as needed        Meds ordered this encounter  Medications   predniSONE (DELTASONE) 20 MG tablet    Sig: Take 1 tablet  (20 mg total) by mouth 2 (two) times daily.    Dispense:  10 tablet    Refill:  0   azithromycin (ZITHROMAX) 250 MG tablet    Sig: Take 2 tabs day 1 and one tab daily from days 2-5    Dispense:  6 tablet    Refill:  0   hydrOXYzine (ATARAX) 25 MG tablet    Sig: Take 1 tablet (25 mg total) by mouth 2 (two) times daily for 7 days.    Dispense:  14 tablet    Refill:  4   albuterol (PROVENTIL) (2.5 MG/3ML) 0.083% nebulizer solution    Sig: Take 3 mLs (2.5 mg total) by nebulization every 6 (six) hours as needed for wheezing or shortness of breath.    Dispense:  75 mL    Refill:  12

## 2023-05-15 NOTE — Progress Notes (Incomplete)
Patient: Zachary [name] MRN: [id number] Sex: male DOB: [birth year]  Provider: [name], MD Location of Care: Pediatric Specialist- Pediatric Complex Care Note type: Routine return visit  History of Present Illness: History from: patient and prior records Chief Complaint: Complex Care   Zachary [name] is a 32 y.o. male with history of focal epilepsy s/p VNS insertion, autism spectrum disorder, congenital quadriparesis, obstructive sleep apnea, and intellectual disability  who I am seeing in follow-up for complex care management. Patient was last seen on 11/22/2022 where I started Trazodone, continued Melatonin, and decreased Senna.  Since that appointment, patient has reported dizziness on Trazodone so it was stopped on 12/09/2022 and reported small seizures on 02/08/2023. Mom also reached out to report increased agitation on 04/23/2023 so Elveria Rising, NPC started Diazepam.   Patient presents today with {CHL AMB PARENT/GUARDIAN:[id number]} who reports the following:   Symptom management:  Still agitatoed at night, described as constant movement, seeming anxioius. Chasing mom and very attached.  Never cries, but outbursts and screaming at school.  It is very loud at school and it ramps him up,  he likes quiet and calm.  Gave zoloft before, don't remember.    Sleep is bette. On trazodone, sleeping more and was unsteady when walking.    Care coordination (other providers): Patient saw Zachary [name], [place] with [facility] on 02/14/2023 where she recommended follow up as needed.   Patient saw Dr. Gwen Pounds with dermatology who continued his current regimen and recommended follow up in one year.   Patient saw Dr. Barney Drain on 05/09/2023 who started hydroxyzine.   Planing sleep study in [place].    Care management needs:   Equipment needs:  No equipment needs.  Getting Diapers ok.   Decision making/Advanced care planning:  Diagnostics/Patient history:  Patient History:  Age at seizure  onset: 32 years of age  Description of all seizure types and duration: Semiology #1: Generalization tonic-clonic lasting up to 2 minutes in duration.  Semiology #2: Focal seizures with hiccup noises, slight tremble of hand, and unresponsiveness that stop with VNS magnet activation.   Complications from seizures (trauma, etc.): He has broken his left shoulder from seizure.  h/o status epilepticus: No   Date of most recent seizure: 03/01/22   Seizure frequency past month (exact number or average per day): 2 Past 3 months: 2   Current AEDs and Current side effects: Keppra 1500 mg BID Oxtellar XR 1800 mg daily at night  Vimpat 100 mg in morning, 100 mg at 3 pm and 400 mg at bedtime.    Prior AEDs (d/c reason?):  Topamax discontinued due to weight loss Depakote discontinued due to low platelets  Pregabalin discontinued due to sleepiness    EEG 10/01/22 Impression: This is an overall normal record with the patient in awake states. No evidence of epileptic activity or decreased seizure threshold.  Background activity without focal slowing or evidence of encephalopathy.  Clinical correlation advised. 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 Theressa Stamps. Patient's AHI was 39.6 RDI is 43.6 positional component. Patient will need desensitization and a gentle approach to CPAP titration.    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 .     Surgical History Past Surgical History:  Procedure Laterality Date   FOOT SURGERY     FRACTURE SURGERY     HIP  SURGERY     IMPLANTATION VAGAL NERVE STIMULATOR     Pediatomy  06/05/2007   SHOULDER SURGERY     TONSILLECTOMY  06/05/2007   TYMPANOSTOMY TUBE PLACEMENT  06/05/2007    Family History family history includes Autism in an other family member; Cancer in his paternal grandmother; Diabetes in his paternal grandfather.   Social History Social History   Social History Narrative   Zachary [name] attends [employer] Monday through Friday    He lives with his mother, maternal grandmother, and mom has a client who lives in the home as an AFL    He enjoys swimming and going to [employer].    Allergies Allergies  Allergen Reactions   Depakote [Divalproex Sodium] Other (See Comments)    Low Platelets   Lyrica [Pregabalin] Other (See Comments)    Sleepiness   Topamax [Topiramate] Other (See Comments)    Weight Loss   Valproic Acid Other (See Comments)    "WILD MAN"     Low Platelets   Penicillins Rash    Has patient had a PCN reaction causing immediate rash, facial/tongue/throat swelling, SOB or lightheadedness with hypotension: Unknown Has patient had a PCN reaction causing severe rash involving mucus membranes or skin necrosis: Yes Has patient had a PCN reaction that required hospitalization: No Has patient had a PCN reaction occurring within the last 10 years: No If all of the above answers are "NO", then may proceed with Cephalosporin use.     Medications Current Outpatient Medications on File Prior to Visit  Medication Sig Dispense Refill   acetaminophen (TYLENOL) 325 MG tablet Take 325 mg by mouth every 6 (six) hours as needed. Take 650 mg by mouth every 6 (six) hours as needed for mild pain (1-3).     albuterol (PROVENTIL) (2.5 MG/3ML) 0.083% nebulizer solution Take 3 mLs (2.5 mg total) by nebulization every 6 (six) hours as needed for wheezing or shortness of breath. 75 mL 12   budesonide (PULMICORT) 0.5 MG/2ML nebulizer solution Take 2 mLs (0.5 mg total) by nebulization daily.  60 mL 12   cefdinir (OMNICEF) 300 MG capsule Take 1 capsule (300 mg total) by mouth 2 (two) times daily. 20 capsule 0   cetirizine (ZYRTEC) 10 MG tablet Take 10 mg by mouth daily.     diazepam (VALIUM) 2 MG tablet Give 1/2 to 1 tablet once per day as needed for agitation and teeth grinding 30 tablet 0   diazePAM, 20 MG Dose, (VALTOCO 20 MG DOSE) 2 x 10 MG/0.1ML LQPK      Docusate Sodium (DSS) 100 MG CAPS Take by mouth.     doxycycline (MONODOX) 100 MG capsule Take one cap po QD x 7 days with food and drink PRN flares. 30 capsule 5   doxycycline (VIBRA-TABS) 100 MG tablet      Fluocinolone Acetonide Body (DERMA-SMOOTHE/FS BODY) 0.01 % OIL Apply to aa's eczema BID up to 5d/wk PRN. 118.28 mL 5   fluticasone (FLONASE) 50 MCG/ACT nasal spray Place 1 spray into both nostrils daily.     hydrOXYzine (ATARAX) 25 MG tablet Take 1 tablet (25 mg total) by mouth at bedtime as needed (  agitation). 30 tablet 3   hydrOXYzine (ATARAX) 25 MG tablet Take 1 tablet (25 mg total) by mouth 2 (two) times daily for 7 days. 14 tablet 4   ketoconazole (NIZORAL) 2 % cream Apply 1 Application topically daily. 60 g 3   ketoconazole (NIZORAL) 2 % shampoo Apply topically 2 (two) times a week. WASH FACE,SCALP, AND BODY TWICE A WEEK, LET SIT FIVE TO 10 MINUTES BEFORE WASHING OFF 120 mL 6   levETIRAcetam (KEPPRA) 1000 MG tablet TAKE 1 AND 1/2 TABLETS EVERY MORNING AND TAKE TWO TABLETS AT night 105 tablet 0   OXcarbazepine ER (OXTELLAR XR) 600 MG TB24 TAKE THREE TABLETS BY MOUTH nightly AT BEDTIME 372 tablet 5   polyethylene glycol (MIRALAX / GLYCOLAX) 17 g packet Take 1 Container by mouth daily as needed.     predniSONE (DELTASONE) 20 MG tablet Take 1 tablet (20 mg total) by mouth 2 (two) times daily. 10 tablet 0   promethazine (PHENERGAN) 25 MG suppository Place rectally.     Sennosides (SENNA) 8.8 MG/5ML LIQD Take 2.5 mLs by mouth daily. 236 mL 3   spironolactone (ALDACTONE) 50 MG tablet Take 100 mg by mouth daily.     VIMPAT  200 MG TABS tablet TAKE 1/2 TABLET in THE MORNING AND afternoon AND TAKE ONE TABLET AT night 60 tablet 5   No current facility-administered medications on file prior to visit.   The medication list was reviewed and reconciled. All changes or newly prescribed medications were explained.  A complete medication list was provided to the patient/caregiver.  Physical Exam There were no vitals taken for this visit. Weight for age: Facility age limit for growth %iles is 20 years.  Length for age: Facility age limit for growth %iles is 20 years. BMI: There is no height or weight on file to calculate BMI. No results found.   Diagnosis: No diagnosis found.   Assessment and Plan GLENDELL WARTMAN is a 32 y.o. male with history of ocal epilepsy s/p VNS insertion, autism spectrum disorder, congenital quadriparesis, obstructive sleep apnea, and intellectual disability who presents for follow-up in the pediatric complex care clinic. Symptom management:     Care coordination:  Care management needs:   Equipment needs:  Due to patient's medical condition, patient is indefinitely incontinent of stool and urine.  It is medically necessary for them to use diapers, underpads, and gloves to assist with hygiene and skin integrity.  They require a frequency of up to 200 a month.   Decision making/Advanced care planning:  The CARE PLAN for reviewed and revised to represent the changes above.  This is available in Epic under snapshot, and a physical binder provided to the patient, that can be used for anyone providing care for the patient.    I spend 40 minutes on day of service on this patient including review of chart, discussion with patient and family, coordination with other providers and management of orders and paperwork.      No follow-ups on file.  [name] MD MPH Neurology,  Neurodevelopment and Neuropalliative care [facility] Child Neurology  [address] [place],  [place], [place] [address] Phone: [phone]

## 2023-05-23 ENCOUNTER — Ambulatory Visit (INDEPENDENT_AMBULATORY_CARE_PROVIDER_SITE_OTHER): Payer: MEDICAID | Admitting: Pediatrics

## 2023-05-23 ENCOUNTER — Encounter (INDEPENDENT_AMBULATORY_CARE_PROVIDER_SITE_OTHER): Payer: Self-pay | Admitting: Pediatrics

## 2023-05-23 VITALS — BP 122/84 | HR 100 | Ht 60.0 in | Wt 182.0 lb

## 2023-05-23 DIAGNOSIS — F79 Unspecified intellectual disabilities: Secondary | ICD-10-CM

## 2023-05-23 DIAGNOSIS — G808 Other cerebral palsy: Secondary | ICD-10-CM | POA: Diagnosis not present

## 2023-05-23 DIAGNOSIS — G40209 Localization-related (focal) (partial) symptomatic epilepsy and epileptic syndromes with complex partial seizures, not intractable, without status epilepticus: Secondary | ICD-10-CM

## 2023-05-23 DIAGNOSIS — F840 Autistic disorder: Secondary | ICD-10-CM | POA: Diagnosis not present

## 2023-05-23 DIAGNOSIS — G4733 Obstructive sleep apnea (adult) (pediatric): Secondary | ICD-10-CM | POA: Diagnosis not present

## 2023-05-23 DIAGNOSIS — G40219 Localization-related (focal) (partial) symptomatic epilepsy and epileptic syndromes with complex partial seizures, intractable, without status epilepticus: Secondary | ICD-10-CM | POA: Diagnosis not present

## 2023-05-23 MED ORDER — TRAZODONE HCL 50 MG PO TABS: 25.0000 mg | ORAL_TABLET | Freq: Every day | ORAL | 3 refills | Status: DC

## 2023-05-23 MED ORDER — HYDROXYZINE HCL 25 MG PO TABS: 25.0000 mg | ORAL_TABLET | Freq: Two times a day (BID) | ORAL | 3 refills | Status: DC | PRN

## 2023-05-23 NOTE — Patient Instructions (Signed)
Symptom management: Give Trazodone 25 mg at 7 pm Continue hydroxyzine 25 mg in the morning You can also give Fadel hydroxyzine 25 mg at bedtime if he is still agitated.  Reach out and let me know if the Trazodone doesn't work

## 2023-05-24 ENCOUNTER — Other Ambulatory Visit (INDEPENDENT_AMBULATORY_CARE_PROVIDER_SITE_OTHER): Payer: Self-pay | Admitting: Family

## 2023-05-24 DIAGNOSIS — G40209 Localization-related (focal) (partial) symptomatic epilepsy and epileptic syndromes with complex partial seizures, not intractable, without status epilepticus: Secondary | ICD-10-CM

## 2023-05-31 ENCOUNTER — Telehealth (INDEPENDENT_AMBULATORY_CARE_PROVIDER_SITE_OTHER): Payer: Self-pay | Admitting: Pediatrics

## 2023-05-31 DIAGNOSIS — G40209 Localization-related (focal) (partial) symptomatic epilepsy and epileptic syndromes with complex partial seizures, not intractable, without status epilepticus: Secondary | ICD-10-CM

## 2023-05-31 NOTE — Telephone Encounter (Signed)
Pharmacy stated that for the Rx sent on 05/24/2023 by Dr. Artis Flock, in order for insurance to pay for brand name scripts; [id number] must be written on the script order. Please resend or contact pharmacy

## 2023-06-03 MED ORDER — LEVETIRACETAM 1000 MG PO TABS: ORAL_TABLET | ORAL | 5 refills | Status: DC

## 2023-06-03 NOTE — Telephone Encounter (Signed)
Fax sent to [facility].   SS, CCMA

## 2023-06-03 NOTE — Telephone Encounter (Signed)
I have printed prescription for brand name.  I do not feel this is necessary, but will dsicuss with family at next appointment as I do think this has been what was prescribed previously.    [name] MD MPH

## 2023-06-11 ENCOUNTER — Encounter (INDEPENDENT_AMBULATORY_CARE_PROVIDER_SITE_OTHER): Payer: Self-pay

## 2023-06-17 ENCOUNTER — Encounter (INDEPENDENT_AMBULATORY_CARE_PROVIDER_SITE_OTHER): Payer: Self-pay | Admitting: Pediatrics

## 2023-07-04 ENCOUNTER — Encounter: Payer: Self-pay | Admitting: Pediatrics

## 2023-07-04 ENCOUNTER — Encounter (INDEPENDENT_AMBULATORY_CARE_PROVIDER_SITE_OTHER): Payer: Self-pay

## 2023-07-04 ENCOUNTER — Encounter (INDEPENDENT_AMBULATORY_CARE_PROVIDER_SITE_OTHER): Payer: Self-pay | Admitting: Pediatrics

## 2023-07-04 ENCOUNTER — Ambulatory Visit: Payer: MEDICAID | Admitting: Pediatrics

## 2023-07-04 VITALS — BP 124/78 | Wt 187.0 lb

## 2023-07-04 DIAGNOSIS — G40319 Generalized idiopathic epilepsy and epileptic syndromes, intractable, without status epilepticus: Secondary | ICD-10-CM

## 2023-07-04 DIAGNOSIS — Z6836 Body mass index (BMI) 36.0-36.9, adult: Secondary | ICD-10-CM | POA: Insufficient documentation

## 2023-07-04 DIAGNOSIS — F819 Developmental disorder of scholastic skills, unspecified: Secondary | ICD-10-CM | POA: Diagnosis not present

## 2023-07-04 DIAGNOSIS — H6691 Otitis media, unspecified, right ear: Secondary | ICD-10-CM

## 2023-07-04 DIAGNOSIS — G40219 Localization-related (focal) (partial) symptomatic epilepsy and epileptic syndromes with complex partial seizures, intractable, without status epilepticus: Secondary | ICD-10-CM

## 2023-07-04 DIAGNOSIS — F840 Autistic disorder: Secondary | ICD-10-CM

## 2023-07-04 DIAGNOSIS — I10 Essential (primary) hypertension: Secondary | ICD-10-CM

## 2023-07-04 DIAGNOSIS — Z0001 Encounter for general adult medical examination with abnormal findings: Secondary | ICD-10-CM | POA: Diagnosis not present

## 2023-07-04 DIAGNOSIS — G4733 Obstructive sleep apnea (adult) (pediatric): Secondary | ICD-10-CM

## 2023-07-04 DIAGNOSIS — N3942 Incontinence without sensory awareness: Secondary | ICD-10-CM

## 2023-07-04 DIAGNOSIS — Z0000 Encounter for general adult medical examination without abnormal findings: Secondary | ICD-10-CM

## 2023-07-04 MED ORDER — TRIAMCINOLONE ACETONIDE 0.025 % EX OINT
1.0000 | TOPICAL_OINTMENT | Freq: Two times a day (BID) | CUTANEOUS | 3 refills | Status: AC
Start: 2023-07-04 — End: 2023-07-11

## 2023-07-04 MED ORDER — AMOXICILLIN-POT CLAVULANATE 500-125 MG PO TABS: 1.0000 | ORAL_TABLET | Freq: Two times a day (BID) | ORAL | 0 refills | Status: DC

## 2023-07-04 NOTE — Patient Instructions (Signed)
Seizure, Adult A seizure is a sudden burst of abnormal activity in the brain. Seizures usually last from 30 seconds to 2 minutes. There are many types of seizures. And they can cause many different symptoms. What are the causes? Common causes of a seizure include: Fever or infection. Problems that affect the brain. These may include: A brain or head injury. A stroke. A brain tumor. Low levels of blood sugar or salt. Kidney problems or liver problems. Some inherited conditions. These are passed down from parent to child. Problems with a substance, such as: Having a reaction to a drug or a medicine. Stopping the use of a substance all of a sudden. When this causes problems, it's called withdrawal. Disorders that affect how you develop, such as autism spectrum disorder or cerebral palsy. Sometimes, the cause may not be known. Some people who have a seizure never have another one. A person who has repeated seizures over time without a clear cause has a condition called epilepsy. What increases the risk? Having a family history of epilepsy. Having had a tonic-clonic seizure before. This type of seizure causes: The muscles of the whole body to tighten, or contract. Loss of consciousness. Having a head injury or a stroke in the past. Having had too little oxygen at birth. What are the signs or symptoms? The symptoms vary depending on the type of seizure you have. Symptoms during a seizure Having convulsions. This means shaking with fast, jerky movements of muscles. Stiffness of the body. Breathing problems. Being confused. Staring or not responding to sound or touch. Head nodding, eye blinking, eye twitching, or fast eye movements. Drooling, grunting, or making clicking sounds with your mouth. Losing control of when you pee or poop. Symptoms before a seizure Feeling afraid, worried, or nervous. Feeling like you may vomit. Vertigo. This feels like: You are moving when you're  not. Things around you are moving when they're not. Dj vu. This is a feeling of having seen or heard something before. Odd tastes or smells. Changes in how you see. You may see flashing lights or spots. Symptoms after a seizure Being confused. Feeling sleepy. Headache. Sore muscles. How is this diagnosed? A seizure may be diagnosed based on: A description of your symptoms. Video of your seizures can be helpful. Your medical history. A physical exam. Tests, such as: Blood tests. CT scan. MRI. Electroencephalogram, or EEG. This test measures electrical activity in the brain. A test of your spinal fluid. This is called a spinal tap or lumbar puncture. How is this treated? If your seizure stops on its own, you will not need treatment. If your seizure lasts longer than 5 minutes, you'll normally need treatment. This may include: Medicines given through an IV. Avoiding things, such as medicines, that are known to cause your seizures. Medicines to prevent seizures. These are called antiepileptics. A device to prevent or control seizures. Eating foods that are low in carbohydrates and high in fat (ketogenic diet). Surgery. This is sometimes needed if you keep having seizures. Follow these instructions at home: Medicines Take your medicines only as told by your health care provider. Avoid anything that may keep your medicine from working, such as alcohol. Activity Follow your provider's advice about driving, swimming, and doing other things that would be dangerous if you had a seizure. Wait until your provider says it's safe for you to do these things. If you live in the [place] ask your local department of motor vehicles [facility]) when you can drive. Get  enough rest and sleep. Not getting enough sleep can make seizures more likely to happen. Teaching others  Teach friends and family what to do if you have a seizure. Tell them to: Help you get down to the ground safely. Protect your head  and body. Loosen any clothing around your neck. Turn you on your side. This helps keep your airway clear if you vomit. Know whether or not you need emergency care. Stay with you until you are better. Also, tell them what not to do if you have a seizure. Tell them: They should not hold you down. They should not put anything in your mouth. General instructions Avoid anything that has caused you to have seizures. Keep a seizure diary. Write down: What you remember about each seizure. What you think might have caused each seizure. Keep all follow-up visits. Your provider may need to monitor your progress. Contact a health care provider if: You have another seizure or seizures. Call each time you have a seizure. You have a change in how often or when you have seizures. You keep having seizures with treatment. You have symptoms of being sick or having an infection. You are not able to take your medicine. Get help right away if: You have or someone has seen you have: A seizure that lasts longer than 5 minutes. Many seizures in a row and you don't feel better between seizures. A seizure that makes it harder to breathe. A seizure that leaves you unable to speak or use a part of your body. You didn't wake up right away after a seizure. You injure yourself during a seizure. You have confusion or pain right after a seizure. These symptoms may be an emergency. Call 911 right away. Do not wait to see if the symptoms will go away. Do not drive yourself to the hospital. This information is not intended to replace advice given to you by your health care provider. Make sure you discuss any questions you have with your health care provider. Document Revised: 02/21/2023 Document Reviewed: 07/04/2022 Elsevier Patient Education  2024 [employer].

## 2023-07-04 NOTE — Progress Notes (Signed)
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  [facility] for implant to help with sleep apnea  [facility] [facility] followed for hyperpigmented skin lesions  Dr Artis Flock ---at [facility] --Dr Lennox [facility] --[facility]   Specialists- Neuro-DR Oren Bracket Pulmonary-N/A Endocrinology--N/A Dental--[facility] Ophthal-none Urology--N/A Surgeon--N/A  EQUIPMENT: Hospital bed Chill out chair Shower chair in shower Patterson lift Diapers --wipes and gloves Pads  Nebulizer Pulse ox Oxygen tubes and tank  Ensure--1 can per day Mom's meals 5/week for day program   Developmental History Developmental delay  Function: Mobility: manual wheelchair Pain concerns: no Hand function: Right: reduced dexterity Left: reduced dexterity Spine curvature: mild Swallowing: normal and modified diet (pureed) Toileting: dependent   Review of Systems:  Hearing: followed by ENT Seizures: yes - controlled Constipation: no Fractures: no  The following portions of the patient's history were reviewed and updated as appropriate: allergies, current medications, past family history, past medical history, past social history, past surgical history and problem list.  Objective:    Physical Exam  Cognition: non-interactive HEENT---wax in left ear---right ear with erythema and dull Eyes---normal  Respiratory: normal, no increased effort CVS--No murmurs  --good heart sounds Lower extremity function: decreased Abdomen: normal--no G tube present Spine scoliosis: mild  Sitting Ability: assisted Gait: wheelchair   Dry skin  Assessment:   Annual Check up  Seizures Autism spectrum Quadriparesis Wheelchair bound Right OM   Plan:    Gross motor: delayed  Fine motor/ADL: delayed  Orthopedics/bracing: bilateral AFO's --present today 7. Other equipment:  bath chair, gait trainer, hand/wrist splint(s), life system, stander, walker, wheelchair and RAMP for house.  NEEDS Urinary retention--need for catheters Risk of COVID-19--need for masks Gaining weight and mom has trouble moving the wheelchair---needs a ELECTRIC one  Meds ordered this encounter  Medications   triamcinolone (KENALOG) 0.025 % ointment    Sig: Apply 1 Application topically 2 (two) times daily for 7 days.    Dispense:  80 g    Refill:  3   amoxicillin-clavulanate (AUGMENTIN) 500-125 MG tablet    Sig: Take 1 tablet by mouth 2 (two) times daily for 10 days.    Dispense:  20 tablet    Refill:  0

## 2023-07-07 ENCOUNTER — Encounter: Payer: Self-pay | Admitting: Pediatrics

## 2023-07-07 MED ORDER — AZITHROMYCIN 250 MG PO TABS: ORAL_TABLET | ORAL | 0 refills | Status: AC

## 2023-08-14 NOTE — Progress Notes (Signed)
 Patient: Zachary [name] MRN: [id number] Sex: male DOB: [birth year]  Provider: [name], MD [facility] Care[facility] Note type: Routine return visit  History of Present Illness: History from: patient and prior records Chief Complaint: Complex Care   Zachary [name] is a 33 y.o. male with history of focal epilepsy s/p VNS insertion, autism spectrum disorder, congenital quadriparesis, obstructive sleep apnea, and intellectual disability who I am seeing in follow-up for complex care management. Patient was last seen on 05/23/2023 where I restarted Trazodone at a lower dose, continued hydroxyzine, and continued AEDs.  Since that appointment, patient has not been to the hospital or the ED.   Patient presents today with {CHL AMB PARENT/GUARDIAN:[id number]} who reports the following:   Symptom management:     Care coordination (other providers): Patient saw Scot Jun, PA with [facility] on 07/18/2023 for a myringotomy tube check. He recommended follow up in 6 months.   Patient had a sleep study on 07/29/2023.  Case management needs:   Equipment needs:   Decision making/Advanced care planning:  Diagnostics/Patient history:  Patient History:  Age at seizure onset: 33 years of age  Description of all seizure types and duration: Semiology #1: Generalization tonic-clonic lasting up to 2 minutes in duration.  Semiology #2: Focal seizures with hiccup noises, slight tremble of hand, and unresponsiveness that stop with VNS magnet activation.   Complications from seizures (trauma, etc.): He has broken his left shoulder from seizure.  h/o status epilepticus: No   Date of most recent seizure: 03/01/22   Seizure frequency past month (exact number or average per day): 2 Past 3 months: 2   Current AEDs and Current side effects: Keppra 1500 mg BID Oxtellar XR 1800 mg daily at night  Vimpat 100 mg in morning, 100 mg at 3 pm and 400 mg at bedtime.     Prior AEDs (d/c reason?):  Topamax discontinued due to weight loss Depakote discontinued due to low platelets  Pregabalin discontinued due to sleepiness    EEG 10/01/22 Impression: This is an overall normal record with the patient in awake states. No evidence of epileptic activity or decreased seizure threshold.  Background activity without focal slowing or evidence of encephalopathy.  Clinical correlation advised. 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 Theressa Stamps. Patient's AHI was 39.6 RDI is 43.6 positional component. Patient will need desensitization and a gentle approach to CPAP titration.    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 .     Surgical History Past Surgical History:  Procedure Laterality Date   FOOT SURGERY     FRACTURE SURGERY     HIP SURGERY     IMPLANTATION VAGAL NERVE STIMULATOR     Pediatomy  06/05/2007   SHOULDER SURGERY     TONSILLECTOMY  06/05/2007   TYMPANOSTOMY TUBE PLACEMENT  06/05/2007    Family History family history includes Autism in an other family member; Cancer in his paternal grandmother; Diabetes in his paternal grandfather.   Social History Social History   Social  History Narrative   Marcques attends [employer] Monday through Friday    He lives with his mother, maternal grandmother, and mom has a client who lives in the home as an AFL    He enjoys swimming and going to [employer].    Allergies Allergies  Allergen Reactions   Depakote [Divalproex Sodium] Other (See Comments)    Low Platelets   Lyrica [Pregabalin] Other (See  Comments)    Sleepiness   Topamax [Topiramate] Other (See Comments)    Weight Loss   Valproic Acid Other (See Comments)    "WILD MAN"     Low Platelets   Penicillins Rash    Has patient had a PCN reaction causing immediate rash, facial/tongue/throat swelling, SOB or lightheadedness with hypotension: Unknown Has patient had a PCN reaction causing severe rash involving mucus membranes or skin necrosis: Yes Has patient had a PCN reaction that required hospitalization: No Has patient had a PCN reaction occurring within the last 10 years: No If all of the above answers are "NO", then may proceed with Cephalosporin use.     Medications Current Outpatient Medications on File Prior to Visit  Medication Sig Dispense Refill   acetaminophen (TYLENOL) 325 MG tablet Take 325 mg by mouth every 6 (six) hours as needed. Take 650 mg by mouth every 6 (six) hours as needed for mild pain (1-3).     albuterol (PROVENTIL) (2.5 MG/3ML) 0.083% nebulizer solution Take 3 mLs (2.5 mg total) by nebulization every 6 (six) hours as needed for wheezing or shortness of breath. 75 mL 12   budesonide (PULMICORT) 0.5 MG/2ML nebulizer solution Take 2 mLs (0.5 mg total) by nebulization daily. 60 mL 12   ciprofloxacin-dexamethasone (CIPRODEX) OTIC suspension Place 1 drop into both ears as needed.     diazepam (VALIUM) 2 MG tablet Give 1/2 to 1 tablet once per day as needed for agitation and teeth grinding (Patient not taking: Reported on 05/23/2023) 30 tablet 0   diazePAM, 20 MG Dose, (VALTOCO 20 MG DOSE) 2 x 10 MG/0.1ML LQPK      Docusate Sodium (DSS) 100 MG CAPS Take by mouth.     doxycycline (MONODOX) 100 MG capsule Take one cap po QD x 7 days with food and drink PRN flares. (Patient not taking: Reported on 05/23/2023) 30 capsule 5   Fluocinolone Acetonide Body (DERMA-SMOOTHE/FS BODY) 0.01 % OIL Apply to aa's eczema BID up to 5d/wk PRN. 118.28 mL 5   fluticasone (FLONASE) 50 MCG/ACT nasal spray Place 1 spray into both  nostrils daily as needed.     hydrOXYzine (ATARAX) 25 MG tablet Take 1 tablet (25 mg total) by mouth 2 (two) times daily as needed (agitation). 60 tablet 3   ketoconazole (NIZORAL) 2 % cream Apply 1 Application topically daily. 60 g 3   ketoconazole (NIZORAL) 2 % shampoo Apply topically 2 (two) times a week. WASH FACE,SCALP, AND BODY TWICE A WEEK, LET SIT FIVE TO 10 MINUTES BEFORE WASHING OFF 120 mL 6   levETIRAcetam (KEPPRA) 1000 MG tablet TAKE 1 AND 1/2 TABLETS EVERY MORNING AND TAKE TWO TABLETS AT NIGHT 105 tablet 5   mineral oil enema Place 1 enema rectally as needed for severe constipation. Family reports using once or twice weekly     OXcarbazepine ER (OXTELLAR XR) 600 MG TB24 TAKE THREE TABLETS BY MOUTH nightly AT BEDTIME 372 tablet 5   polyethylene glycol (MIRALAX / GLYCOLAX) 17 g packet Take 1 Container by mouth daily.  Sennosides (SENNA) 8.8 MG/5ML LIQD Take 2.5 mLs by mouth daily. (Patient taking differently: Take 2.5 mLs by mouth every other day.) 236 mL 3   spironolactone (ALDACTONE) 50 MG tablet Take 50 mg by mouth 2 (two) times daily.     traZODone (DESYREL) 50 MG tablet Take 0.5 tablets (25 mg total) by mouth daily. Every evening 16 tablet 3   VIMPAT 200 MG TABS tablet TAKE 1/2 TABLET in THE MORNING AND afternoon AND TAKE ONE TABLET AT night 60 tablet 5   No current facility-administered medications on file prior to visit.   The medication list was reviewed and reconciled. All changes or newly prescribed medications were explained.  A complete medication list was provided to the patient/caregiver.  Physical Exam There were no vitals taken for this visit. Weight for age: Facility age limit for growth %iles is 20 years.  Length for age: Facility age limit for growth %iles is 20 years. BMI: There is no height or weight on file to calculate BMI. No results found.   Diagnosis: No diagnosis found.   Assessment and Plan Zachary [name] is a 33 y.o. male with history of focal  epilepsy s/p VNS insertion, autism spectrum disorder, congenital quadriparesis, obstructive sleep apnea, and intellectual disability who presents for follow-up in the pediatric complex care clinic.  Symptom management:     Care coordination:  Case management needs:   Equipment needs:  Due to patient's medical condition, patient is indefinitely incontinent of stool and urine.  It is medically necessary for them to use diapers, underpads, and gloves to assist with hygiene and skin integrity.  They require a frequency of up to 200 a month.   Decision making/Advanced care planning:  The CARE PLAN for reviewed and revised to represent the changes above.  This is available in Epic under snapshot, and a physical binder provided to the patient, that can be used for anyone providing care for the patient.    I spend ** minutes on day of service on this patient including review of chart, discussion with patient and family, coordination with other providers and management of orders and paperwork.      No follow-ups on file.  [name] MD MPH Neurology,  Neurodevelopment and Neuropalliative care [facility]  [address] [place], [place], [place] [address] Phone: [phone]

## 2023-08-16 ENCOUNTER — Ambulatory Visit (INDEPENDENT_AMBULATORY_CARE_PROVIDER_SITE_OTHER): Payer: MEDICAID | Admitting: Pediatrics

## 2023-08-16 VITALS — Wt 184.0 lb

## 2023-08-16 DIAGNOSIS — F458 Other somatoform disorders: Secondary | ICD-10-CM

## 2023-08-16 DIAGNOSIS — H6693 Otitis media, unspecified, bilateral: Secondary | ICD-10-CM | POA: Diagnosis not present

## 2023-08-16 DIAGNOSIS — R451 Restlessness and agitation: Secondary | ICD-10-CM | POA: Diagnosis not present

## 2023-08-16 MED ORDER — DIAZEPAM 2 MG PO TABS: ORAL_TABLET | ORAL | 0 refills | Status: DC

## 2023-08-16 MED ORDER — TRIAMCINOLONE ACETONIDE 0.025 % EX OINT: 1.0000 | TOPICAL_OINTMENT | Freq: Two times a day (BID) | CUTANEOUS | 3 refills | Status: AC

## 2023-08-16 MED ORDER — AMOXICILLIN-POT CLAVULANATE 500-125 MG PO TABS: 1.0000 | ORAL_TABLET | Freq: Two times a day (BID) | ORAL | 0 refills | Status: AC

## 2023-08-18 ENCOUNTER — Encounter: Payer: Self-pay | Admitting: Pediatrics

## 2023-08-18 DIAGNOSIS — H6693 Otitis media, unspecified, bilateral: Secondary | ICD-10-CM | POA: Insufficient documentation

## 2023-08-18 DIAGNOSIS — R451 Restlessness and agitation: Secondary | ICD-10-CM | POA: Insufficient documentation

## 2023-08-18 DIAGNOSIS — F458 Other somatoform disorders: Secondary | ICD-10-CM | POA: Insufficient documentation

## 2023-08-18 NOTE — Patient Instructions (Signed)

## 2023-08-18 NOTE — Progress Notes (Signed)
 Subjective   Zachary [name], 33 y.o.  non verbal male, with severe developmental delay and seizure disorder who presents with bilateral ear pain, congestion, irritability, and banging at both ears .  Symptoms started 2 days ago.  He is taking fluids well.  There are no other significant complaints.  The patient's history has been marked as reviewed and updated as appropriate.  Objective   Wt 184 lb (83.5 kg)   BMI 35.94 kg/m   General appearance:  well hydrated, fretful, and non verbal with hitting both ears as if in pain  Nasal: Neck:  Mild nasal congestion with clear rhinorrhea Neck is supple  Ears:  External ears are normal Right TM - erythematous, dull, and bulging Left TM - erythematous, dull, and bulging  Oropharynx:  Mucous membranes are moist; there is mild erythema of the posterior pharynx  Lungs:  Lungs are clear to auscultation  Heart:  Regular rate and rhythm; no murmurs or rubs  Skin:  No rashes or lesions noted   Assessment   Acute bilateral otitis media  Plan   1) Antibiotics per orders  Meds ordered this encounter  Medications   diazepam (VALIUM) 2 MG tablet    Sig: Give 1/2 to 1 tablet once per day as needed for agitation and teeth grinding    Dispense:  30 tablet    Refill:  0   amoxicillin-clavulanate (AUGMENTIN) 500-125 MG tablet    Sig: Take 1 tablet by mouth 2 (two) times daily for 10 days.    Dispense:  20 tablet    Refill:  0   triamcinolone (KENALOG) 0.025 % ointment    Sig: Apply 1 Application topically 2 (two) times daily for 7 days.    Dispense:  80 g    Refill:  3    2) Fluids, acetaminophen as needed 3) Recheck if symptoms persist for 2 or more days, symptoms worsen, or new symptoms develop.

## 2023-08-21 ENCOUNTER — Encounter: Payer: Self-pay | Admitting: Pediatrics

## 2023-08-21 MED ORDER — AZITHROMYCIN 250 MG PO TABS: ORAL_TABLET | ORAL | 3 refills | Status: AC

## 2023-08-22 ENCOUNTER — Encounter (INDEPENDENT_AMBULATORY_CARE_PROVIDER_SITE_OTHER): Payer: Self-pay | Admitting: Family

## 2023-08-22 ENCOUNTER — Ambulatory Visit (INDEPENDENT_AMBULATORY_CARE_PROVIDER_SITE_OTHER): Payer: MEDICAID | Admitting: Family

## 2023-08-22 ENCOUNTER — Encounter (INDEPENDENT_AMBULATORY_CARE_PROVIDER_SITE_OTHER): Payer: MEDICAID | Admitting: Family

## 2023-08-22 VITALS — HR 90 | Resp 18

## 2023-08-22 DIAGNOSIS — N3942 Incontinence without sensory awareness: Secondary | ICD-10-CM

## 2023-08-22 DIAGNOSIS — R451 Restlessness and agitation: Secondary | ICD-10-CM | POA: Diagnosis not present

## 2023-08-22 DIAGNOSIS — K219 Gastro-esophageal reflux disease without esophagitis: Secondary | ICD-10-CM | POA: Diagnosis not present

## 2023-08-22 DIAGNOSIS — F79 Unspecified intellectual disabilities: Secondary | ICD-10-CM

## 2023-08-22 DIAGNOSIS — G40319 Generalized idiopathic epilepsy and epileptic syndromes, intractable, without status epilepticus: Secondary | ICD-10-CM

## 2023-08-22 DIAGNOSIS — F840 Autistic disorder: Secondary | ICD-10-CM | POA: Diagnosis not present

## 2023-08-22 DIAGNOSIS — G40219 Localization-related (focal) (partial) symptomatic epilepsy and epileptic syndromes with complex partial seizures, intractable, without status epilepticus: Secondary | ICD-10-CM

## 2023-08-22 DIAGNOSIS — F819 Developmental disorder of scholastic skills, unspecified: Secondary | ICD-10-CM

## 2023-08-22 MED ORDER — OMEPRAZOLE 20 MG PO CPDR: 20.0000 mg | DELAYED_RELEASE_CAPSULE | Freq: Two times a day (BID) | ORAL | 1 refills | Status: DC

## 2023-08-22 NOTE — Patient Instructions (Signed)
 It was a pleasure to see you today!  Instructions for you until your next appointment are as follows: Start Omeprazole 20mg  - give 30 minutes prior to a meal in the morning and at night Please sign up for MyChart if you have not done so. Please plan to return for follow up in 2 weeks or sooner if needed.  Feel free to contact our office during normal business hours at [phone] with questions or concerns. If there is no answer or the call is outside business hours, please leave a message and our clinic staff will call you back within the next business day.  If you have an urgent concern, please stay on the line for our after-hours answering service and ask for the on-call neurologist.     I also encourage you to use MyChart to communicate with me more directly. If you have not yet signed up for MyChart within [facility], the front desk staff can help you. However, please note that this inbox is NOT monitored on nights or weekends, and response can take up to 2 business days.  Urgent matters should be discussed with the on-call pediatric neurologist.   At [facility], we are committed to providing exceptional care. You will receive a patient satisfaction survey through text or email regarding your visit today. Your opinion is important to me. Comments are appreciated.

## 2023-08-23 ENCOUNTER — Encounter (INDEPENDENT_AMBULATORY_CARE_PROVIDER_SITE_OTHER): Payer: Self-pay | Admitting: Family

## 2023-08-23 DIAGNOSIS — K219 Gastro-esophageal reflux disease without esophagitis: Secondary | ICD-10-CM | POA: Insufficient documentation

## 2023-08-28 NOTE — Progress Notes (Signed)
 Opened by mistake.

## 2023-08-31 ENCOUNTER — Other Ambulatory Visit (INDEPENDENT_AMBULATORY_CARE_PROVIDER_SITE_OTHER): Payer: Self-pay | Admitting: Family

## 2023-08-31 DIAGNOSIS — G40319 Generalized idiopathic epilepsy and epileptic syndromes, intractable, without status epilepticus: Secondary | ICD-10-CM

## 2023-09-02 ENCOUNTER — Telehealth (INDEPENDENT_AMBULATORY_CARE_PROVIDER_SITE_OTHER): Payer: Self-pay | Admitting: Family

## 2023-09-02 ENCOUNTER — Other Ambulatory Visit (INDEPENDENT_AMBULATORY_CARE_PROVIDER_SITE_OTHER): Payer: Self-pay

## 2023-09-02 DIAGNOSIS — G40319 Generalized idiopathic epilepsy and epileptic syndromes, intractable, without status epilepticus: Secondary | ICD-10-CM

## 2023-09-02 MED ORDER — VIMPAT 200 MG PO TABS: ORAL_TABLET | ORAL | 5 refills | Status: DC

## 2023-09-02 NOTE — Telephone Encounter (Signed)
 The pharmacy called and said that the refill request was sent back because in the comment/notes section it needs to read: "Brand Medically Necessary." The pharmacy is asking that you update this and then resend the Rx request.

## 2023-09-02 NOTE — Telephone Encounter (Signed)
 Faxed to [facility].   SS, CCMA

## 2023-09-06 ENCOUNTER — Telehealth (INDEPENDENT_AMBULATORY_CARE_PROVIDER_SITE_OTHER): Payer: MEDICAID | Admitting: Family

## 2023-09-06 DIAGNOSIS — G40319 Generalized idiopathic epilepsy and epileptic syndromes, intractable, without status epilepticus: Secondary | ICD-10-CM | POA: Diagnosis not present

## 2023-09-06 DIAGNOSIS — N3942 Incontinence without sensory awareness: Secondary | ICD-10-CM

## 2023-09-06 DIAGNOSIS — R451 Restlessness and agitation: Secondary | ICD-10-CM

## 2023-09-06 DIAGNOSIS — Z79899 Other long term (current) drug therapy: Secondary | ICD-10-CM

## 2023-09-06 DIAGNOSIS — G40219 Localization-related (focal) (partial) symptomatic epilepsy and epileptic syndromes with complex partial seizures, intractable, without status epilepticus: Secondary | ICD-10-CM

## 2023-09-06 DIAGNOSIS — K219 Gastro-esophageal reflux disease without esophagitis: Secondary | ICD-10-CM | POA: Diagnosis not present

## 2023-09-06 DIAGNOSIS — F819 Developmental disorder of scholastic skills, unspecified: Secondary | ICD-10-CM

## 2023-09-06 MED ORDER — MOTPOLY XR 100 MG PO CP24: 300.0000 mg | ORAL_CAPSULE | Freq: Every day | ORAL | Status: DC

## 2023-09-06 NOTE — Progress Notes (Signed)
 This is a Pediatric Specialist E-Visit consult/follow up provided via My Chart Video Visit (Caregility). Zachary [name] and his mother Wei Poplaski consented to an E-Visit consult today.  Is the patient present for the video visit? Yes Location of patient: Russ is at home Is the patient located in the state of [place]? Yes Location of provider: Elveria Rising, NP-C is at office Patient was referred by [name], MD   The following participants were involved in this E-Visit: CMA, NP, patient's mother  This visit was done via VIDEO   Chief Complain/ Reason for E-Visit today: medication follow up Total time on call: 20 min Follow up:  2 weeks   WISDOM RICKEY   MRN:  [id number]  1991/01/18   Provider: [name] Location of Care: [facility]  Visit type: Virtual visit  Last visit: 08/22/2023  Referral source: [name], MD History from: Epic chart and patient's mother   Brief history:  Copied from previous record: History of focal epilepsy s/p VNS insertion, autism spectrum disorder, congenital quadriparesis, obstructive sleep apnea, and intellectual disability   Today's concerns: When Amritpal was last seen, he was experiencing reflux during sleep. Omeprazole was prescribed and Mom reports today that the medication helped with symptoms He was also having episodes of agitation that were sometimes difficult to manage. Mom reports today that he recently saw neurologist at [facility] who recommended PRN use of Lorazepam for agitation Mom asked about elevated Levetiracetam level in labs that were drawn at [facility]. He also had slightly low bilirubin and protein levels. Mom asked about simplifying Eashan's medication regimen because of caregiver record keeping required by Derenda Fennel has been otherwise generally healthy since he was last seen. No health concerns today other than previously mentioned.  Review of  systems: Please see HPI for neurologic and other pertinent review of systems. Otherwise all other systems were reviewed and were negative.  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 Theressa Stamps. Patient's AHI was 39.6 RDI is 43.6 positional component. Patient will need desensitization and a gentle approach to CPAP titration.    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 .     Past medical history comments: See HPI Copied from previous record: Age at seizure onset: 33 years of age   Description of all seizure types and duration: Semiology #1: Generalization tonic-clonic lasting up to 2 minutes in duration.  Semiology #2: Focal seizures with hiccup noises, slight tremble of hand, and unresponsiveness that stop with VNS magnet activation.   Complications from seizures (trauma, etc.): He has broken his left shoulder from seizure.  h/o status epilepticus: No  Surgical history: Past Surgical History:  Procedure Laterality Date   FOOT SURGERY     FRACTURE SURGERY     HIP SURGERY     IMPLANTATION VAGAL NERVE STIMULATOR     Pediatomy  06/05/2007   SHOULDER SURGERY     TONSILLECTOMY  06/05/2007   TYMPANOSTOMY TUBE PLACEMENT  06/05/2007     Family history: family history includes Autism in an other family member; Cancer in his paternal grandmother; Diabetes in his paternal grandfather.   Social history: Social History   Socioeconomic History   Marital status: Single    Spouse name: Not on file   Number of children: Not on file   Years of education: Not on file   Highest education level: Not on file  Occupational History   Not on file  Tobacco Use   Smoking status: Never    Passive exposure: Never   Smokeless tobacco: Never  Substance and Sexual Activity   Alcohol use: No   Drug use: No   Sexual activity: Never  Other Topics Concern   Not on file  Social History Narrative   [place] attends [employer] Monday through Friday    He lives with his mother, maternal grandmother, and mom has a client who lives in the home as an AFL    He enjoys swimming and going to [employer].   Social Drivers of Corporate investment banker Strain: Not on file  Food Insecurity: Low Risk  (02/14/2023)   Received from [facility]   Hunger Vital Sign    Worried About Running Out of Food in the Last Year: Never true    Ran Out of Food in the Last Year: Never true  Transportation Needs: No Transportation Needs (02/14/2023)   Received from [employer]    In the past 12 months, has lack of reliable transportation kept you from medical appointments, meetings, work or from getting things needed for daily living? : No  Physical Activity: Not on file  Stress: Not on file  Social Connections: Not on file  Intimate Partner Violence: Not on file    Past/failed meds: Copied from previous record: Topamax discontinued due to weight loss Depakote discontinued due to low platelets  Pregabalin discontinued due to sleepiness   Allergies: Allergies  Allergen Reactions   Depakote [Divalproex Sodium] Other (See Comments)    Low Platelets   Lyrica [Pregabalin] Other (See Comments)    Sleepiness   Topamax [Topiramate] Other (See Comments)    Weight Loss   Valproic Acid Other (See Comments)    "WILD MAN"     Low Platelets   Penicillins Rash    Has patient had a PCN reaction causing immediate rash, facial/tongue/throat swelling, SOB or lightheadedness with hypotension: Unknown Has patient had a PCN reaction causing severe rash involving mucus membranes or skin necrosis: Yes Has patient had a PCN reaction that required hospitalization: No Has patient had a PCN reaction occurring  within the last 10 years: No If all of the above answers are "NO", then may proceed with Cephalosporin use.     Immunizations: Immunization History  Administered Date(s) Administered   DTaP 09/19/1989, 11/20/1989, 01/21/1990, 10/24/1990, 02/28/1995   HIB (PRP-OMP) 09/19/1989, 11/20/1989, 01/21/1990, 10/24/1990   Hepatitis B 03/13/2002, 04/17/2002, 09/09/2002   IPV 09/19/1989, 11/20/1989, 10/24/1990, 02/28/1995   Influenza Split 01/22/2008, 03/01/2009, 04/05/2011   Influenza,inj,Quad PF,6+ Mos 03/12/2016, 04/04/2017, 04/15/2018, 05/05/2019, 05/18/2020, 06/13/2022   Influenza,inj,quad, With Preservative 04/02/2013, 01/29/2014, 01/31/2015   MMR 10/24/1990, 02/28/1995   Meningococcal Conjugate 06/25/2011   Moderna Sars-Covid-2 Vaccination 08/06/2019,  09/11/2019, 04/23/2020   Td 11/19/2007   Tdap 05/05/2019    Diagnostics/Screenings: Copied from previous record: Sleep study ([facility]) 07/29/2023- INTERPRETATION: This overnight polysomnogram demonstrates that the patient has:  1) Overall, moderate obstructive sleep apnea (AHI 29) 2) the majority of the respiratory events (AHI approximately 20) are secondary to device stimulation from his VNS (vagal nerve stimulator). 3) Decreased sleep efficiency (55%) The apnea/hypopnea breakdown was as follows: 96.8% obstructive, 2.4% central, 0.8% mixed. The SaO2 nadir during sleep was 77%. CLINICAL CORRELATION:  1) The degree of sleep apnea in this study is moderate and may contribute to daytime sleepiness and long-term cardiovascular morbidity. 2) Initial therapeutic effort should focus on adjustment of stimulation parameters of VNS to lessen impact on ventilation during sleep 3) Consider treatment with CPAP but with careful follow-up given uncertain efficacy in this clinical setting.   EEG 10/01/22 Impression: This is an overall normal record with the patient in awake states. No evidence of epileptic activity or decreased seizure threshold.  Background activity without focal slowing or evidence of encephalopathy.  Clinical correlation advised.  Physical Exam: There were no vitals taken for this visit.  General: well developed, well nourished man, pacing in his home, in no evident distress Head: normocephalic and atraumatic. No dysmorphic features. Neck: supple Musculoskeletal: No skeletal deformities or obvious scoliosis Skin: no rashes or neurocutaneous lesions  Neurologic Exam Mental Status: Awake and fully alert.  Has no language but made occasional yells and vocalizations. Was not attentive to video visit Cranial Nerves: Turns to localize faces, objects and sounds in the periphery. Face, tongue, palate move normally and symmetrically. 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 No recent diagnostic studies to be reviewed with the patient.  Plan until next visit: Change Lacosamide to MotpolyXR 100mg , 3 capsules at bedtime Samples given of Motpoly XR for him to try [employer] form completed for Mom Return next week for lab draw for trough Levetiracetam level I will contact a dietician about the protein level on labs drawn at [facility] Continue other medications as prescribed  Call for questions or concerns Return in 2 weeks  The medication list was reviewed and reconciled. I reviewed the changes that were made in the prescribed medications today. A complete medication  list was provided to the patient.  Orders Placed This Encounter  Procedures   Levetiracetam level    Standing Status:   Future    Number of Occurrences:   1    Expected Date:   09/12/2023    Expiration Date:   09/05/2024   Allergies as of 09/06/2023       Reactions   Depakote [divalproex Sodium] Other (See Comments)   Low Platelets   Lyrica [pregabalin] Other (See Comments)   Sleepiness   Topamax [topiramate] Other (See Comments)   Weight Loss   Valproic Acid Other (See Comments)   "WILD MAN"     Low Platelets    Penicillins Rash   Has patient had a PCN reaction causing immediate rash, facial/tongue/throat swelling, SOB or lightheadedness with hypotension: Unknown Has patient had a PCN reaction causing severe rash involving mucus membranes or skin necrosis: Yes Has patient had a PCN reaction that required hospitalization: No Has patient had a PCN reaction occurring within the last 10 years: No If all of the above answers are "NO", then may proceed with Cephalosporin use.        Medication List        Accurate as of September 06, 2023 11:59 PM. If you have any questions, ask your nurse or doctor.          STOP taking these medications    Vimpat 200 MG Tabs tablet Generic drug: lacosamide Replaced by: Motpoly XR 100 MG Cp24       TAKE these medications    acetaminophen 325 MG tablet Commonly known as: TYLENOL Take 325 mg by mouth every 6 (six) hours as needed. Take 650 mg by mouth every 6 (six) hours as needed for mild pain (1-3).   albuterol (2.5 MG/3ML) 0.083% nebulizer solution Commonly known as: PROVENTIL Take 3 mLs (2.5 mg total) by nebulization every 6 (six) hours as needed for wheezing or shortness of breath.   budesonide 0.5 MG/2ML nebulizer solution Commonly known as: PULMICORT Take 2 mLs (0.5 mg total) by nebulization daily.   cephALEXin 500 MG capsule Commonly known as: KEFLEX Take 500 mg by mouth 2 (two) times daily.   ciprofloxacin-dexamethasone OTIC suspension Commonly known as: CIPRODEX Place 1 drop into both ears as needed.   diazepam 2 MG tablet Commonly known as: VALIUM Give 1/2 to 1 tablet once per day as needed for agitation and teeth grinding   doxycycline 100 MG capsule Commonly known as: MONODOX Take one cap po QD x 7 days with food and drink PRN flares.   DSS 100 MG Caps Take by mouth.   Fluocinolone Acetonide Body 0.01 % Oil Commonly known as: Derma-Smoothe/FS Body Apply to aa's eczema BID up to 5d/wk PRN.   fluticasone 50 MCG/ACT nasal  spray Commonly known as: FLONASE Place 1 spray into both nostrils daily as needed.   hydrOXYzine 25 MG tablet Commonly known as: ATARAX Take 1 tablet (25 mg total) by mouth 2 (two) times daily as needed (agitation).   ketoconazole 2 % cream Commonly known as: NIZORAL Apply 1 Application topically daily.   ketoconazole 2 % shampoo Commonly known as: NIZORAL Apply topically 2 (two) times a week. WASH FACE,SCALP, AND BODY TWICE A WEEK, LET SIT FIVE TO 10 MINUTES BEFORE WASHING OFF   levETIRAcetam 1000 MG tablet Commonly known as: Keppra TAKE 1 AND 1/2 TABLETS EVERY MORNING AND TAKE TWO TABLETS AT NIGHT   mineral oil enema Place 1 enema rectally as needed for severe constipation.  Family reports using once or twice weekly   Motpoly XR 100 MG Cp24 Generic drug: Lacosamide ER Take 300 mg by mouth at bedtime. Replaces: Vimpat 200 MG Tabs tablet   omeprazole 20 MG capsule Commonly known as: PRILOSEC Take 1 capsule (20 mg total) by mouth 2 (two) times daily before a meal.   Oxtellar XR 600 MG Tb24 Generic drug: OXcarbazepine ER TAKE THREE TABLETS BY MOUTH nightly AT BEDTIME   polyethylene glycol 17 g packet Commonly known as: MIRALAX / GLYCOLAX Take 1 Container by mouth daily.   Senna 8.8 MG/5ML Liqd Take 2.5 mLs by mouth daily. What changed: when to take this   spironolactone 50 MG tablet Commonly known as: ALDACTONE Take 50 mg by mouth 2 (two) times daily.   traZODone 50 MG tablet Commonly known as: DESYREL Take 0.5 tablets (25 mg total) by mouth daily. Every evening   Valtoco 20 MG Dose 10 MG/0.1ML Lqpk Generic drug: diazePAM (20 MG Dose)      Total time spent with the patient was 20 minutes, of which 50% or more was spent in counseling and coordination of care.  [name] [facility] [address] [place], [place] [address] Ph. [phone] Fax [phone]

## 2023-09-06 NOTE — Patient Instructions (Addendum)
 It was a pleasure to see you today!  Instructions for you until your next appointment are as follows: Change Lacosamide 200mg  to Motpoly XR 100mg  - 3 capsules at bedtime. Motpoly XR is long acting Lacosamide and will help with reducing how often you have to give Zachary [name] medication.  I placed samples of the Motpoly XR 100mg  at the front desk for you to pick up I completed a [employer] form for the Motpoly XR Continue the other medications as prescribed for now I have ordered a blood test to be done next Thursday. Remember to have the blood drawn in the morning before giving any medication. Zachary [name] can have breakfast and drink fluids.  I will talk to a dietician about the Ensure and how much protein Zachary [name] needs each day Please sign up for MyChart if you have not done so. Please plan to return for follow up in 2 weeks or sooner if needed.  Feel free to contact our office during normal business hours at [phone] with questions or concerns. If there is no answer or the call is outside business hours, please leave a message and our clinic staff will call you back within the next business day.  If you have an urgent concern, please stay on the line for our after-hours answering service and ask for the on-call neurologist.     I also encourage you to use MyChart to communicate with me more directly. If you have not yet signed up for MyChart within [facility], the front desk staff can help you. However, please note that this inbox is NOT monitored on nights or weekends, and response can take up to 2 business days.  Urgent matters should be discussed with the on-call pediatric neurologist.   At [facility], we are committed to providing exceptional care. You will receive a patient satisfaction survey through text or email regarding your visit today. Your opinion is important to me. Comments are appreciated.

## 2023-09-07 ENCOUNTER — Encounter (INDEPENDENT_AMBULATORY_CARE_PROVIDER_SITE_OTHER): Payer: Self-pay | Admitting: Family

## 2023-09-07 DIAGNOSIS — Z79899 Other long term (current) drug therapy: Secondary | ICD-10-CM | POA: Insufficient documentation

## 2023-09-17 NOTE — Progress Notes (Unsigned)
 This is a Pediatric Specialist E-Visit consult/follow up provided via My Chart Video Visit (Caregility). Zachary [name] and his mother Bradden Tadros consented to an E-Visit consult today.  Is the patient present for the video visit? Yes Location of patient: [name] is at home  Is the patient located in the state of [place] ? Yes Location of provider: Lyndol Santee, NP-C is at office Patient was referred by Hadassah Letters, MD   The following participants were involved in this E-Visit: CMA, NP, patient's mother  This visit was done via VIDEO   Chief Complain/ Reason for E-Visit today: medication follow up Total time on call: 15 min Follow up: 2 weeks   GILMORE LIST   MRN:  [id number]  03/20/1991   Provider: Lyndol Santee NP-C Location of Care: [facility]  Visit type: Return visit  Last visit: 09/06/2023  Referral source: Hadassah Letters, MD History from: Epic chart and patient's mother  Brief history:  Copied from previous record: History of focal epilepsy s/p VNS insertion, autism spectrum disorder, congenital quadriparesis, obstructive sleep apnea, and intellectual disability   Today's concerns: He is seen today in follow up for recent medication changes. Mom reports today that she has not changed to the Motpoly XR yet because she wanted to use up the supple of Lacosamide that he had on hand.  At his last visit, Levetiracetam level was ordered but Mom has not yet been able to get that done.  Mom is planning a trip to [place] in May and is training a caregiver to help grandmother with him while she is traveling.  Zachary [name] has been otherwise generally healthy since he was last seen. No health concerns today other than previously mentioned.  Review of systems: Please see HPI for neurologic and other pertinent review of systems. Otherwise all other systems were reviewed and were negative.  Problem List: Patient Active Problem  List   Diagnosis Date Noted   Encounter for long-term current use of medication 09/07/2023   Gastroesophageal reflux disease 08/23/2023   Bruxism (teeth grinding) 08/18/2023   Agitation 08/18/2023   Acute otitis media in pediatric patient, bilateral 08/18/2023   BMI 36.0-36.9,adult 07/04/2023   Otitis media in pediatric patient, right 07/04/2023   Acute bacterial sinusitis 05/12/2023   Acute otitis media of left ear in pediatric patient 11/30/2022   Fracture shoulder 11/22/2022   Intellectual delay 10/05/2022   Urinary incontinence without sensory awareness 10/05/2022   Constipation 10/05/2022   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 Rito Chess. Patient's AHI was 39.6 RDI is 43.6 positional component. Patient will need desensitization and a gentle approach to CPAP titration.    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 .     Past medical history comments: See HPI Copied from previous record: Age at seizure onset: 33 years of age  Description of all seizure types and duration: Semiology #1: Generalization tonic-clonic lasting up to 2 minutes in  duration.  Semiology #2: Focal seizures with hiccup noises, slight tremble of hand, and unresponsiveness that stop with VNS magnet activation.   Complications from seizures (trauma, etc.): He has broken his left shoulder from seizure.  h/o status epilepticus: No  Surgical history: Past Surgical History:  Procedure Laterality Date   FOOT SURGERY     FRACTURE SURGERY     HIP SURGERY     IMPLANTATION VAGAL NERVE STIMULATOR     Pediatomy  06/05/2007   SHOULDER SURGERY     TONSILLECTOMY  06/05/2007   TYMPANOSTOMY TUBE PLACEMENT  06/05/2007     Family history: family history includes Autism in an other family member; Cancer in his paternal grandmother; Diabetes in his paternal grandfather.   Social history: Social History   Socioeconomic History   Marital status: Single    Spouse name: Not on file   Number of children: Not on file   Years of education: Not on file   Highest education level: Not on file  Occupational History   Not on file  Tobacco Use   Smoking status: Never    Passive exposure: Never   Smokeless tobacco: Never  Substance and Sexual Activity   Alcohol use: No   Drug use: No   Sexual activity: Never  Other Topics Concern   Not on file  Social History Narrative   Masiah attends [employer] Monday through Friday    He lives with his mother, maternal grandmother, and mom has a client who lives in the home as an AFL    He enjoys swimming and going to [employer].   Social Drivers of Corporate investment banker Strain: Not on file  Food Insecurity: Low Risk  (02/14/2023)   Received from [facility]   Hunger Vital Sign    Worried About Running Out of Food in the Last Year: Never true    Ran Out of Food in the Last Year: Never true  Transportation Needs: No Transportation Needs (02/14/2023)   Received from [facility]    In the past 12 months, has lack of reliable transportation kept you from medical appointments, meetings, work or  from getting things needed for daily living? : No  Physical Activity: Not on file  Stress: Not on file  Social Connections: Not on file  Intimate Partner Violence: Not on file    Past/failed meds: Copied from previous record: Topamax discontinued due to weight loss Depakote discontinued due to low platelets  Pregabalin discontinued due to sleepiness    Allergies: Allergies  Allergen Reactions   Depakote [Divalproex Sodium] Other (See Comments)    Low Platelets   Lyrica [Pregabalin] Other (See Comments)    Sleepiness   Topamax [Topiramate] Other (See Comments)    Weight Loss   Valproic Acid Other (See Comments)    "WILD MAN"     Low Platelets   Penicillins Rash    Has patient had a PCN reaction causing immediate rash, facial/tongue/throat swelling, SOB or lightheadedness with hypotension: Unknown Has patient had a PCN reaction causing severe rash involving mucus membranes or skin necrosis: Yes Has patient had a PCN reaction that required hospitalization: No Has patient had a PCN reaction  occurring within the last 10 years: No If all of the above answers are "NO", then may proceed with Cephalosporin use.     Immunizations: Immunization History  Administered Date(s) Administered   DTaP 09/19/1989, 11/20/1989, 01/21/1990, 10/24/1990, 02/28/1995   HIB (PRP-OMP) 09/19/1989, 11/20/1989, 01/21/1990, 10/24/1990   Hepatitis B 03/13/2002, 04/17/2002, 09/09/2002   IPV 09/19/1989, 11/20/1989, 10/24/1990, 02/28/1995   Influenza Split 01/22/2008, 03/01/2009, 04/05/2011   Influenza,inj,Quad PF,6+ Mos 03/12/2016, 04/04/2017, 04/15/2018, 05/05/2019, 05/18/2020, 06/13/2022   Influenza,inj,quad, With Preservative 04/02/2013, 01/29/2014, 01/31/2015   MMR 10/24/1990, 02/28/1995   Meningococcal Conjugate 06/25/2011   Moderna Sars-Covid-2 Vaccination 08/06/2019, 09/11/2019, 04/23/2020   Td 11/19/2007   Tdap 05/05/2019    Diagnostics/Screenings: Copied from previous record: Sleep study  ([facility]) 07/29/2023- INTERPRETATION: This overnight polysomnogram demonstrates that the patient has:  1) Overall, moderate obstructive sleep apnea (AHI 29) 2) the majority of the respiratory events (AHI approximately 20) are secondary to device stimulation from his VNS (vagal nerve stimulator). 3) Decreased sleep efficiency (55%) The apnea/hypopnea breakdown was as follows: 96.8% obstructive, 2.4% central, 0.8% mixed. The SaO2 nadir during sleep was 77%. CLINICAL CORRELATION:  1) The degree of sleep apnea in this study is moderate and may contribute to daytime sleepiness and long-term cardiovascular morbidity. 2) Initial therapeutic effort should focus on adjustment of stimulation parameters of VNS to lessen impact on ventilation during sleep 3) Consider treatment with CPAP but with careful follow-up given uncertain efficacy in this clinical setting.    EEG 10/01/22 Impression: This is an overall normal record with the patient in awake states. No evidence of epileptic activity or decreased seizure threshold.  Background activity without focal slowing or evidence of encephalopathy.  Clinical correlation advised.  Physical Exam: There were no vitals taken for this visit.  General: well developed, well nourished man, seated at his home, in no evident distress Head: normocephalic and atraumatic. No dysmorphic features. Neck: supple Musculoskeletal: No skeletal deformities or obvious scoliosis Skin: no rashes or neurocutaneous lesions  Neurologic Exam Mental Status: Awake and fully alert.  Has no language but made occasional vocalizations. Was not attentive to video visit Cranial Nerves: Turns to localize faces, objects and sounds in the periphery. 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 No recent diagnostic studies to be reviewed with the patient.  Plan until next visit: Switch to Motpoly XR when you finish current supply of Lacosamide Continue other medications as prescribed  Get Levetiracetam level drawn when you can Call for questions or concerns Will see again in 2 weeks  The medication list was reviewed and reconciled. No changes were made in the prescribed medications today. A complete medication list was provided to the patient.  Allergies as of 09/19/2023       Reactions   Depakote [divalproex Sodium] Other (See Comments)   Low Platelets   Lyrica [pregabalin] Other (See Comments)   Sleepiness   Topamax [topiramate] Other (See Comments)   Weight Loss   Valproic Acid Other (See Comments)   "  WILD MAN"     Low Platelets   Penicillins Rash   Has patient had a PCN reaction causing immediate rash, facial/tongue/throat swelling, SOB or lightheadedness with hypotension: Unknown Has patient had a PCN reaction causing severe rash involving mucus membranes or skin necrosis: Yes Has patient had a PCN reaction that required hospitalization: No Has patient had a PCN reaction occurring within the last 10 years: No If all of the above answers are "NO", then may proceed with Cephalosporin use.        Medication List        Accurate as of September 17, 2023  6:33 PM. If you have any questions, ask your nurse or doctor.          acetaminophen 325 MG tablet Commonly known as:  TYLENOL Take 325 mg by mouth every 6 (six) hours as needed. Take 650 mg by mouth every 6 (six) hours as needed for mild pain (1-3).   albuterol (2.5 MG/3ML) 0.083% nebulizer solution Commonly known as: PROVENTIL Take 3 mLs (2.5 mg total) by nebulization every 6 (six) hours as needed for wheezing or shortness of breath.   budesonide 0.5 MG/2ML nebulizer solution Commonly known as: PULMICORT Take 2 mLs (0.5 mg total) by nebulization daily.   cephALEXin 500 MG capsule Commonly known as: KEFLEX Take 500 mg by mouth 2 (two) times daily.   ciprofloxacin-dexamethasone OTIC suspension Commonly known as: CIPRODEX Place 1 drop into both ears as needed.   diazepam 2 MG tablet Commonly known as: VALIUM Give 1/2 to 1 tablet once per day as needed for agitation and teeth grinding   doxycycline 100 MG capsule Commonly known as: MONODOX Take one cap po QD x 7 days with food and drink PRN flares.   DSS 100 MG Caps Take by mouth.   Fluocinolone Acetonide Body 0.01 % Oil Commonly known as: Derma-Smoothe/FS Body Apply to aa's eczema BID up to 5d/wk PRN.   fluticasone 50 MCG/ACT nasal spray Commonly known as: FLONASE Place 1 spray into both nostrils daily as needed.   hydrOXYzine 25 MG tablet Commonly known as: ATARAX Take 1 tablet (25 mg total) by mouth 2 (two) times daily as needed (agitation).   ketoconazole 2 % cream Commonly known as: NIZORAL Apply 1 Application topically daily.   ketoconazole 2 % shampoo Commonly known as: NIZORAL Apply topically 2 (two) times a week. WASH FACE,SCALP, AND BODY TWICE A WEEK, LET SIT FIVE TO 10 MINUTES BEFORE WASHING OFF   levETIRAcetam 1000 MG tablet Commonly known as: Keppra TAKE 1 AND 1/2 TABLETS EVERY MORNING AND TAKE TWO TABLETS AT NIGHT   mineral oil enema Place 1 enema rectally as needed for severe constipation. Family reports using once or twice weekly   Motpoly XR 100 MG Cp24 Generic drug: Lacosamide ER Take 300 mg by mouth at  bedtime.   omeprazole 20 MG capsule Commonly known as: PRILOSEC Take 1 capsule (20 mg total) by mouth 2 (two) times daily before a meal.   Oxtellar XR 600 MG Tb24 Generic drug: OXcarbazepine ER TAKE THREE TABLETS BY MOUTH nightly AT BEDTIME   polyethylene glycol 17 g packet Commonly known as: MIRALAX / GLYCOLAX Take 1 Container by mouth daily.   Senna 8.8 MG/5ML Liqd Take 2.5 mLs by mouth daily. What changed: when to take this   spironolactone 50 MG tablet Commonly known as: ALDACTONE Take 50 mg by mouth 2 (two) times daily.   traZODone 50 MG tablet Commonly known as: DESYREL Take 0.5 tablets (  25 mg total) by mouth daily. Every evening   Valtoco 20 MG Dose 10 MG/0.1ML Lqpk Generic drug: diazePAM (20 MG Dose)      Total time spent with the patient was 15 minutes, of which 50% or more was spent in counseling and coordination of care.  Lyndol Santee NP-C [facility] [name] [place], [place] [address] Ph. [phone] Fax [phone]

## 2023-09-19 ENCOUNTER — Encounter (INDEPENDENT_AMBULATORY_CARE_PROVIDER_SITE_OTHER): Payer: Self-pay | Admitting: Family

## 2023-09-19 ENCOUNTER — Telehealth (INDEPENDENT_AMBULATORY_CARE_PROVIDER_SITE_OTHER): Payer: MEDICAID | Admitting: Family

## 2023-09-19 DIAGNOSIS — G40219 Localization-related (focal) (partial) symptomatic epilepsy and epileptic syndromes with complex partial seizures, intractable, without status epilepticus: Secondary | ICD-10-CM

## 2023-09-19 DIAGNOSIS — R451 Restlessness and agitation: Secondary | ICD-10-CM

## 2023-09-19 DIAGNOSIS — G40319 Generalized idiopathic epilepsy and epileptic syndromes, intractable, without status epilepticus: Secondary | ICD-10-CM | POA: Diagnosis not present

## 2023-09-19 DIAGNOSIS — F819 Developmental disorder of scholastic skills, unspecified: Secondary | ICD-10-CM

## 2023-09-19 DIAGNOSIS — F458 Other somatoform disorders: Secondary | ICD-10-CM

## 2023-09-19 DIAGNOSIS — N3942 Incontinence without sensory awareness: Secondary | ICD-10-CM

## 2023-09-19 DIAGNOSIS — K219 Gastro-esophageal reflux disease without esophagitis: Secondary | ICD-10-CM | POA: Diagnosis not present

## 2023-09-19 DIAGNOSIS — F840 Autistic disorder: Secondary | ICD-10-CM

## 2023-09-19 NOTE — Patient Instructions (Addendum)
 It was a pleasure to see you today!  Instructions for you until your next appointment are as follows: Try to get blood test done next week Please sign up for MyChart if you have not done so. Please plan to return for follow up in 2 weeks or sooner if needed.  Feel free to contact our office during normal business hours at [phone] with questions or concerns. If there is no answer or the call is outside business hours, please leave a message and our clinic staff will call you back within the next business day.  If you have an urgent concern, please stay on the line for our after-hours answering service and ask for the on-call neurologist.     I also encourage you to use MyChart to communicate with me more directly. If you have not yet signed up for MyChart within [facility], the front desk staff can help you. However, please note that this inbox is NOT monitored on nights or weekends, and response can take up to 2 business days.  Urgent matters should be discussed with the on-call pediatric neurologist.   At [facility], we are committed to providing exceptional care. You will receive a patient satisfaction survey through text or email regarding your visit today. Your opinion is important to me. Comments are appreciated.

## 2023-10-01 ENCOUNTER — Encounter (INDEPENDENT_AMBULATORY_CARE_PROVIDER_SITE_OTHER): Payer: Self-pay

## 2023-10-02 NOTE — Progress Notes (Signed)
 This is a Pediatric Specialist E-Visit consult/follow up provided via My Chart Video Visit (Caregility). Zachary [name] and his mother Amadou Blackmore consented to an E-Visit consult today.  Is the patient present for the video visit? Yes Location of patient: [place] is at home Is the patient located in the state of [place] ? Yes Location of provider: Lyndol Santee, NP-C is at office Patient was referred by Hadassah Letters, MD   The following participants were involved in this E-Visit: CMA, NP, patient's mother  This visit was done via VIDEO   Chief Complain/ Reason for E-Visit today: medication changes follow up Total time on call: 15 min Follow up: June 2025    Zachary [name]   MRN:  [id number]  1991-02-01   Provider: Lyndol Santee NP-C Location of Care: [facility]  Visit type: Return visit  Last visit: 09/19/2023  Referral source: Hadassah Letters, MD History from: Epic chart and patient's mother  Brief history:  Copied from previous record: History of focal epilepsy s/p VNS insertion, autism spectrum disorder, congenital quadriparesis, obstructive sleep apnea, and intellectual disability    Due to his medical condition, Berlie is indefinitely incontinent of stool and urine.  It is medically necessary for him to use diapers, underpads, and gloves to assist with hygiene and skin integrity.     Today's concerns: Mom reports today that Zachary [name] has remained seizure free for the last month. She is pleased with the addition of Lacosamide  for seizures. She has been giving him Motpoly  XR samples given to her at previous visit and has found that simplifying his medication regimen has helped.  Mom also reports that Omeprazole  has helped with reflux symptoms. He seems to have more gas, but is burping and has flatulence.  Mom will be leaving for a trip to [place] on May 15th. Zachary [name] will be in the care of relatives and [employer]  caregivers during that time.  Zachary [name] has been otherwise generally healthy since he was last seen. No health concerns today other than previously mentioned.  Review of systems: Please see HPI for neurologic and other pertinent review of systems. Otherwise all other systems were reviewed and were negative.  Problem List: Patient Active Problem List   Diagnosis Date Noted   Encounter for long-term current use of medication 09/07/2023   Gastroesophageal reflux disease 08/23/2023   Bruxism (teeth grinding) 08/18/2023   Agitation 08/18/2023   Acute otitis media in pediatric patient, bilateral 08/18/2023   BMI 36.0-36.9,adult 07/04/2023   Otitis media in pediatric patient, right 07/04/2023   Acute bacterial sinusitis 05/12/2023   Acute otitis media of left ear in pediatric patient 11/30/2022   Fracture shoulder 11/22/2022   Intellectual delay 10/05/2022   Urinary incontinence without sensory awareness 10/05/2022   Constipation 10/05/2022   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 Rito Chess. Patient's AHI was 39.6 RDI is 43.6 positional component. Patient will need desensitization and a gentle approach to CPAP titration.    CP (cerebral palsy) (HCC)  Fracture of femur, intertrochanteric, closed (HCC)    Fracture shoulder    left   Hypertension    MR (mental retardation)    Recurrent apnea    Seizures (HCC)    Status post VNS (vagus nerve  stimulator) placement 11/09/2013   Placed in 05/23/11, Dr Darlys Eland follows the settings and his seizure disorder. .     Past medical history comments: See HPI Copied from previous record: Age at seizure onset: 33 years of age  Description of all seizure types and duration: Semiology #1: Generalization tonic-clonic lasting up to 2 minutes in duration.  Semiology #2: Focal seizures with hiccup noises, slight tremble of hand, and unresponsiveness that stop with VNS magnet activation.   Complications from seizures (trauma, etc.): He has broken his left shoulder from seizure.  h/o status epilepticus: No  Surgical history: Past Surgical History:  Procedure Laterality Date   FOOT SURGERY     FRACTURE SURGERY     HIP SURGERY     IMPLANTATION VAGAL NERVE STIMULATOR     Pediatomy  06/05/2007   SHOULDER SURGERY     TONSILLECTOMY  06/05/2007   TYMPANOSTOMY TUBE PLACEMENT  06/05/2007     Family history: family history includes Autism in an other family member; Cancer in his paternal grandmother; Diabetes in his paternal grandfather.   Social history: Social History   Socioeconomic History   Marital status: Single    Spouse name: Not on file   Number of children: Not on file   Years of education: Not on file   Highest education level: Not on file  Occupational History   Not on file  Tobacco Use   Smoking status: Never    Passive exposure: Never   Smokeless tobacco: Never  Substance and Sexual Activity   Alcohol use: No   Drug use: No   Sexual activity: Never  Other Topics Concern   Not on file  Social History Narrative   Zachary [name] attends [employer] Monday through Friday    He lives with his mother, maternal grandmother, and mom has a client who lives in the home as an AFL    He enjoys swimming and going to [employer].   Social Drivers of Corporate investment banker Strain: Not on file  Food Insecurity: Low Risk  (02/14/2023)   Received from [facility]   Hunger  Vital Sign    Worried About Running Out of Food in the Last Year: Never true    Ran Out of Food in the Last Year: Never true  Transportation Needs: No Transportation Needs (02/14/2023)   Received from [facility]    In the past 12 months, has lack of reliable transportation kept you from medical appointments, meetings, work or from getting things needed for daily living? : No  Physical Activity: Not on file  Stress: Not on file  Social Connections: Not on file  Intimate Partner Violence: Not on file   Past/failed meds: Copied from previous record: Topamax discontinued due to weight loss Depakote discontinued due to low platelets  Pregabalin discontinued due to sleepiness   Allergies: Allergies  Allergen Reactions   Depakote [Divalproex Sodium] Other (See Comments)    Low Platelets   Lyrica [Pregabalin] Other (See Comments)    Sleepiness   Topamax [Topiramate] Other (See Comments)    Weight Loss   Valproic Acid Other (See Comments)    "WILD MAN"     Low Platelets   Penicillins Rash  Has patient had a PCN reaction causing immediate rash, facial/tongue/throat swelling, SOB or lightheadedness with hypotension: Unknown Has patient had a PCN reaction causing severe rash involving mucus membranes or skin necrosis: Yes Has patient had a PCN reaction that required hospitalization: No Has patient had a PCN reaction occurring within the last 10 years: No If all of the above answers are "NO", then may proceed with Cephalosporin use.     Immunizations: Immunization History  Administered Date(s) Administered   DTaP 09/19/1989, 11/20/1989, 01/21/1990, 10/24/1990, 02/28/1995   HIB (PRP-OMP) 09/19/1989, 11/20/1989, 01/21/1990, 10/24/1990   Hepatitis B 03/13/2002, 04/17/2002, 09/09/2002   IPV 09/19/1989, 11/20/1989, 10/24/1990, 02/28/1995   Influenza Split 01/22/2008, 03/01/2009, 04/05/2011   Influenza,inj,Quad PF,6+ Mos 03/12/2016, 04/04/2017, 04/15/2018, 05/05/2019,  05/18/2020, 06/13/2022   Influenza,inj,quad, With Preservative 04/02/2013, 01/29/2014, 01/31/2015   MMR 10/24/1990, 02/28/1995   Meningococcal Conjugate 06/25/2011   Moderna Sars-Covid-2 Vaccination 08/06/2019, 09/11/2019, 04/23/2020   Td 11/19/2007   Tdap 05/05/2019    Diagnostics/Screenings: Copied from previous record: Sleep study ([facility]) 07/29/2023- INTERPRETATION: This overnight polysomnogram demonstrates that the patient has:  1) Overall, moderate obstructive sleep apnea (AHI 29) 2) the majority of the respiratory events (AHI approximately 20) are secondary to device stimulation from his VNS (vagal nerve stimulator). 3) Decreased sleep efficiency (55%) The apnea/hypopnea breakdown was as follows: 96.8% obstructive, 2.4% central, 0.8% mixed. The SaO2 nadir during sleep was 77%. CLINICAL CORRELATION:  1) The degree of sleep apnea in this study is moderate and may contribute to daytime sleepiness and long-term cardiovascular morbidity. 2) Initial therapeutic effort should focus on adjustment of stimulation parameters of VNS to lessen impact on ventilation during sleep 3) Consider treatment with CPAP but with careful follow-up given uncertain efficacy in this clinical setting.    EEG 10/01/22 Impression: This is an overall normal record with the patient in awake states. No evidence of epileptic activity or decreased seizure threshold.  Background activity without focal slowing or evidence of encephalopathy.  Clinical correlation advised.  Physical Exam: Wt 184 lb (83.5 kg)   BMI 35.94 kg/m   General: well developed, well nourished man, lying in bed at his home, in no evident distress Head: normocephalic and atraumatic. No dysmorphic features. Neck: supple Musculoskeletal: No skeletal deformities or obvious scoliosis Skin: no rashes or neurocutaneous lesions  Neurologic Exam Mental Status: Awake and fully alert.  Has no language. Made occasional vocalizations. Was not attentive to  video visit.  Cranial Nerves: Turns to localize faces, objects and sounds in the periphery. 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 No recent diagnostic studies to be reviewed with the patient.  Plan until next visit: Motpoly  XR 150mg  -2 at bedtime prescription sent to pharmacy Continue other medications as prescribed  Community Support form for medications completed. Mom will pick up Call for questions or concerns Return in about 1 month (around 11/03/2023).  The medication list was reviewed and reconciled. I reviewed the changes that were made in the prescribed medications today. A complete medication list was provided to the patient.  Allergies as  of 10/03/2023       Reactions   Depakote [divalproex Sodium] Other (See Comments)   Low Platelets   Lyrica [pregabalin] Other (See Comments)   Sleepiness   Topamax [topiramate] Other (See Comments)   Weight Loss   Valproic Acid Other (See Comments)   "WILD MAN"     Low Platelets   Penicillins Rash   Has patient had a PCN reaction causing immediate rash, facial/tongue/throat swelling, SOB or lightheadedness with hypotension: Unknown Has patient had a PCN reaction causing severe rash involving mucus membranes or skin necrosis: Yes Has patient had a PCN reaction that required hospitalization: No Has patient had a PCN reaction occurring within the last 10 years: No If all of the above answers are  "NO", then may proceed with Cephalosporin use.        Medication List        Accurate as of Oct 03, 2023  9:23 AM. If you have any questions, ask your nurse or doctor.          STOP taking these medications    cephALEXin  500 MG capsule Commonly known as: KEFLEX  Stopped by: Lyndol Santee   ciprofloxacin -dexamethasone  OTIC suspension Commonly known as: CIPRODEX  Stopped by: Lyndol Santee   levETIRAcetam  1000 MG tablet Commonly known as: Keppra  Replaced by: Keppra  1000 MG tablet Stopped by: Lyndol Santee   Motpoly  XR 100 MG Cp24 Generic drug: Lacosamide  ER Replaced by: Motpoly  XR 150 MG Cp24 Stopped by: Lyndol Santee   Senna 8.8 MG/5ML Liqd Stopped by: Lyndol Santee   traZODone  50 MG tablet Commonly known as: DESYREL  Stopped by: Lyndol Santee       TAKE these medications    acetaminophen  325 MG tablet Commonly known as: TYLENOL  Take 325 mg by mouth every 6 (six) hours as needed. Take 650 mg by mouth every 6 (six) hours as needed for mild pain (1-3).   albuterol  (2.5 MG/3ML) 0.083% nebulizer solution Commonly known as: PROVENTIL  Take 3 mLs (2.5 mg total) by nebulization every 6 (six) hours as needed for wheezing or shortness of breath.   budesonide  0.5 MG/2ML nebulizer solution Commonly known as: PULMICORT  Take 2 mLs (0.5 mg total) by nebulization daily.   diazepam  2 MG tablet Commonly known as: VALIUM  Give 1/2 to 1 tablet once per day as needed for agitation and teeth grinding   doxycycline  100 MG capsule Commonly known as: MONODOX  Take one cap po QD x 7 days with food and drink PRN flares.   DSS 100 MG Caps Take by mouth.   Fluocinolone  Acetonide Body 0.01 % Oil Commonly known as: Derma-Smoothe /FS Body Apply to aa's eczema BID up to 5d/wk PRN.   fluticasone  50 MCG/ACT nasal spray Commonly known as: FLONASE  Place 1 spray into both nostrils daily as needed.   hydrOXYzine  25 MG tablet Commonly known as: ATARAX  Take 1 tablet  (25 mg total) by mouth 2 (two) times daily as needed (agitation).   Keppra  1000 MG tablet Generic drug: levETIRAcetam  TAKE 1 AND 1/2 TABLETS EVERY MORNING AND TAKE TWO TABLETS AT NIGHT Replaces: levETIRAcetam  1000 MG tablet Started by: Lyndol Santee   ketoconazole  2 % cream Commonly known as: NIZORAL  Apply 1 Application topically daily.   ketoconazole  2 % shampoo Commonly known as: NIZORAL  Apply topically 2 (two) times a week. WASH FACE,SCALP, AND BODY TWICE A WEEK, LET SIT FIVE TO 10 MINUTES BEFORE WASHING OFF   mineral oil enema Place 1 enema rectally as needed for severe constipation. Family reports using once or  twice weekly   Motpoly  XR 150 MG Cp24 Generic drug: Lacosamide  ER Take 300 mg by mouth at bedtime. Replaces: Motpoly  XR 100 MG Cp24 Started by: Lyndol Santee   omeprazole  20 MG capsule Commonly known as: PRILOSEC Take 1 capsule (20 mg total) by mouth 2 (two) times daily before a meal.   OXcarbazepine  ER 600 MG Tb24 Commonly known as: Oxtellar XR  TAKE THREE TABLETS BY MOUTH nightly AT BEDTIME   polyethylene glycol 17 g packet Commonly known as: MIRALAX  / GLYCOLAX  Take 1 Container by mouth daily.   spironolactone 50 MG tablet Commonly known as: ALDACTONE Take 50 mg by mouth 2 (two) times daily.   Valtoco  20 MG Dose 10 MG/0.1ML Lqpk Generic drug: diazePAM  (20 MG Dose)      Total time spent with the patient was 15 minutes, of which 50% or more was spent in counseling and coordination of care.  Lyndol Santee NP-C [facility] [name] [place], [place] [address] Ph. [phone] Fax [phone]

## 2023-10-03 ENCOUNTER — Telehealth (INDEPENDENT_AMBULATORY_CARE_PROVIDER_SITE_OTHER): Payer: MEDICAID | Admitting: Family

## 2023-10-03 ENCOUNTER — Encounter (INDEPENDENT_AMBULATORY_CARE_PROVIDER_SITE_OTHER): Payer: Self-pay | Admitting: Family

## 2023-10-03 VITALS — Wt 184.0 lb

## 2023-10-03 DIAGNOSIS — G40319 Generalized idiopathic epilepsy and epileptic syndromes, intractable, without status epilepticus: Secondary | ICD-10-CM | POA: Diagnosis not present

## 2023-10-03 DIAGNOSIS — G40209 Localization-related (focal) (partial) symptomatic epilepsy and epileptic syndromes with complex partial seizures, not intractable, without status epilepticus: Secondary | ICD-10-CM | POA: Diagnosis not present

## 2023-10-03 DIAGNOSIS — K219 Gastro-esophageal reflux disease without esophagitis: Secondary | ICD-10-CM

## 2023-10-03 MED ORDER — OXCARBAZEPINE ER 600 MG PO TB24: ORAL_TABLET | ORAL | 5 refills | Status: DC

## 2023-10-03 MED ORDER — MOTPOLY XR 150 MG PO CP24: 300.0000 mg | ORAL_CAPSULE | Freq: Every day | ORAL | 5 refills | Status: DC

## 2023-10-03 MED ORDER — OMEPRAZOLE 20 MG PO CPDR: 20.0000 mg | DELAYED_RELEASE_CAPSULE | Freq: Two times a day (BID) | ORAL | 5 refills | Status: DC

## 2023-10-03 MED ORDER — KEPPRA 1000 MG PO TABS: ORAL_TABLET | ORAL | 5 refills | Status: DC

## 2023-10-03 NOTE — Patient Instructions (Signed)
 It was a pleasure to see you today!  Instructions for you until your next appointment are as follows: I sent in the prescription for the Motpoly  XR 150mg . Give 2 capsules at bedtime The [employer] form has been completed and is at the front desk for pick up Continue Koran's other medications as prescribed Please sign up for MyChart if you have not done so. Please plan to return for follow up in June or sooner if needed.  Feel free to contact our office during normal business hours at [phone] with questions or concerns. If there is no answer or the call is outside business hours, please leave a message and our clinic staff will call you back within the next business day.  If you have an urgent concern, please stay on the line for our after-hours answering service and ask for the on-call neurologist.     I also encourage you to use MyChart to communicate with me more directly. If you have not yet signed up for MyChart within [facility], the front desk staff can help you. However, please note that this inbox is NOT monitored on nights or weekends, and response can take up to 2 business days.  Urgent matters should be discussed with the on-call pediatric neurologist.   At [facility], we are committed to providing exceptional care. You will receive a patient satisfaction survey through text or email regarding your visit today. Your opinion is important to me. Comments are appreciated.

## 2023-10-17 ENCOUNTER — Encounter (INDEPENDENT_AMBULATORY_CARE_PROVIDER_SITE_OTHER): Payer: Self-pay

## 2023-10-17 DIAGNOSIS — G40319 Generalized idiopathic epilepsy and epileptic syndromes, intractable, without status epilepticus: Secondary | ICD-10-CM

## 2023-10-17 MED ORDER — VALTOCO 20 MG DOSE 2 X 10 MG/0.1ML NA LQPK: NASAL | 5 refills | Status: AC

## 2023-10-27 ENCOUNTER — Encounter: Payer: Self-pay | Admitting: Pediatrics

## 2023-10-30 ENCOUNTER — Encounter (HOSPITAL_COMMUNITY): Payer: Self-pay

## 2023-10-30 ENCOUNTER — Emergency Department (HOSPITAL_COMMUNITY)
Admission: EM | Admit: 2023-10-30 | Discharge: 2023-10-30 | Disposition: A | Discharge status: Home / Self Care | Payer: MEDICAID | Attending: Emergency Medicine | Admitting: Emergency Medicine

## 2023-10-30 ENCOUNTER — Emergency Department (HOSPITAL_COMMUNITY): Payer: MEDICAID

## 2023-10-30 ENCOUNTER — Other Ambulatory Visit: Payer: Self-pay

## 2023-10-30 DIAGNOSIS — R509 Fever, unspecified: Secondary | ICD-10-CM | POA: Diagnosis present

## 2023-10-30 DIAGNOSIS — J4521 Mild intermittent asthma with (acute) exacerbation: Secondary | ICD-10-CM | POA: Insufficient documentation

## 2023-10-30 DIAGNOSIS — U071 COVID-19: Secondary | ICD-10-CM | POA: Insufficient documentation

## 2023-10-30 DIAGNOSIS — R569 Unspecified convulsions: Secondary | ICD-10-CM | POA: Diagnosis not present

## 2023-10-30 LAB — CBC WITH DIFFERENTIAL/PLATELET
Abs Immature Granulocytes: 0.02 10*3/uL (ref 0.00–0.07)
Basophils Absolute: 0 10*3/uL (ref 0.0–0.1)
Basophils Relative: 0 %
Eosinophils Absolute: 0 10*3/uL (ref 0.0–0.5)
Eosinophils Relative: 0 %
HCT: 45.1 % (ref 39.0–52.0)
Hemoglobin: 14.6 g/dL (ref 13.0–17.0)
Immature Granulocytes: 0 %
Lymphocytes Relative: 6 %
Lymphs Abs: 0.4 10*3/uL — ABNORMAL LOW (ref 0.7–4.0)
MCH: 27.7 pg (ref 26.0–34.0)
MCHC: 32.4 g/dL (ref 30.0–36.0)
MCV: 85.6 fL (ref 80.0–100.0)
Monocytes Absolute: 0.6 10*3/uL (ref 0.1–1.0)
Monocytes Relative: 10 %
Neutro Abs: 5.3 10*3/uL (ref 1.7–7.7)
Neutrophils Relative %: 84 %
Platelets: 148 10*3/uL — ABNORMAL LOW (ref 150–400)
RBC: 5.27 MIL/uL (ref 4.22–5.81)
RDW: 13.1 % (ref 11.5–15.5)
WBC: 6.4 10*3/uL (ref 4.0–10.5)
nRBC: 0 % (ref 0.0–0.2)

## 2023-10-30 LAB — URINALYSIS, W/ REFLEX TO CULTURE (INFECTION SUSPECTED)
Bacteria, UA: NONE SEEN
Bilirubin Urine: NEGATIVE
Glucose, UA: NEGATIVE mg/dL
Hgb urine dipstick: NEGATIVE
Ketones, ur: NEGATIVE mg/dL
Leukocytes,Ua: NEGATIVE
Nitrite: NEGATIVE
Protein, ur: 30 mg/dL — AB
Specific Gravity, Urine: 1.03 (ref 1.005–1.030)
pH: 6 (ref 5.0–8.0)

## 2023-10-30 LAB — COMPREHENSIVE METABOLIC PANEL WITH GFR
ALT: 25 U/L (ref 0–44)
AST: 22 U/L (ref 15–41)
Albumin: 3.6 g/dL (ref 3.5–5.0)
Alkaline Phosphatase: 42 U/L (ref 38–126)
Anion gap: 9 (ref 5–15)
BUN: 14 mg/dL (ref 6–20)
CO2: 26 mmol/L (ref 22–32)
Calcium: 8.8 mg/dL — ABNORMAL LOW (ref 8.9–10.3)
Chloride: 101 mmol/L (ref 98–111)
Creatinine, Ser: 0.86 mg/dL (ref 0.61–1.24)
GFR, Estimated: >60 mL/min (ref >60)
Glucose, Bld: 96 mg/dL (ref 70–99)
Potassium: 4 mmol/L (ref 3.5–5.1)
Sodium: 136 mmol/L (ref 135–145)
Total Bilirubin: 0.7 mg/dL (ref 0.0–1.2)
Total Protein: 6.6 g/dL (ref 6.5–8.1)

## 2023-10-30 LAB — RESP PANEL BY RT-PCR (RSV, FLU A&B, COVID)  RVPGX2
Influenza A by PCR: NEGATIVE
Influenza B by PCR: NEGATIVE
Resp Syncytial Virus by PCR: NEGATIVE
SARS Coronavirus 2 by RT PCR: POSITIVE — AB

## 2023-10-30 LAB — PHOSPHORUS: Phosphorus: 3.1 mg/dL (ref 2.5–4.6)

## 2023-10-30 LAB — I-STAT CG4 LACTIC ACID, ED: Lactic Acid, Venous: 0.9 mmol/L (ref 0.5–1.9)

## 2023-10-30 LAB — CBG MONITORING, ED: Glucose-Capillary: 89 mg/dL (ref 70–99)

## 2023-10-30 LAB — MAGNESIUM: Magnesium: 1.8 mg/dL (ref 1.7–2.4)

## 2023-10-30 MED ORDER — ACETAMINOPHEN 325 MG PO TABS
650.0000 mg | ORAL_TABLET | Freq: Once | ORAL | Status: AC
  Administered 2023-10-30: 650 mg via ORAL
  Filled 2023-10-30: qty 2

## 2023-10-30 MED ORDER — SODIUM CHLORIDE 0.9 % IV BOLUS
1000.0000 mL | Freq: Once | INTRAVENOUS | Status: AC
  Administered 2023-10-30: 1000 mL via INTRAVENOUS

## 2023-10-30 MED ORDER — IPRATROPIUM-ALBUTEROL 0.5-2.5 (3) MG/3ML IN SOLN
3.0000 mL | Freq: Once | RESPIRATORY_TRACT | Status: AC
  Filled 2023-10-30: qty 3

## 2023-10-30 MED ORDER — LEVETIRACETAM (KEPPRA) 500 MG/5 ML ADULT IV PUSH
1500.0000 mg | Freq: Once | INTRAVENOUS | Status: AC
  Administered 2023-10-30: 1500 mg via INTRAVENOUS
  Filled 2023-10-30: qty 15

## 2023-10-30 MED ORDER — METHYLPREDNISOLONE SODIUM SUCC 125 MG IJ SOLR
125.0000 mg | Freq: Once | INTRAMUSCULAR | Status: AC
  Administered 2023-10-30: 125 mg via INTRAVENOUS
  Filled 2023-10-30: qty 2

## 2023-10-30 MED ORDER — IPRATROPIUM-ALBUTEROL 0.5-2.5 (3) MG/3ML IN SOLN
RESPIRATORY_TRACT | Status: AC
  Administered 2023-10-30: 3 mL via RESPIRATORY_TRACT
  Filled 2023-10-30: qty 3

## 2023-10-30 MED ORDER — METHYLPREDNISOLONE 4 MG PO TBPK: ORAL_TABLET | ORAL | 0 refills | Status: DC

## 2023-10-30 MED ORDER — PAXLOVID (300/100) 20 X 150 MG & 10 X 100MG PO TBPK: 3.0000 | ORAL_TABLET | Freq: Two times a day (BID) | ORAL | 0 refills | Status: AC

## 2023-10-30 NOTE — ED Notes (Signed)
 Pt twitching a bit in bed

## 2023-10-30 NOTE — ED Notes (Signed)
 Patient is resting comfortably.

## 2023-10-30 NOTE — ED Triage Notes (Addendum)
 Patient BIB [facility] from home. Caregiver (mom) has covid. Patients at home covid test was positive. Has had a fever all day. Has not taken anything for fever. Patient had 2 seizures one at 6am and 12:15pm today. Mom did not give patient his rescue seizure medication. Patient has no appetite. Nonverbal autistic, cerebral palsy.

## 2023-10-30 NOTE — ED Provider Notes (Signed)
  Physical Exam  BP 100/67   Pulse 79   Temp 99.4 F (37.4 C) (Rectal)   Resp 20   Ht 5' (1.524 m)   Wt 84 kg   SpO2 95%   BMI 36.17 kg/m   Physical Exam  Procedures  Procedures  ED Course / MDM   Clinical Course as of 10/30/23 1945  Wed Oct 30, 2023  1619 Patient signed out to Dr. Delana Favors pending labs, urine, and CXR in stable condition. [VK]    Clinical Course User Index [VK] Kingsley, Victoria K, DO   Medical Decision Making There is 4 PM.  Patient is here with fever and concern for possible COVID.  Patient also has a history of seizure and had several seizures today.  Signout pending COVID test and urinalysis and chest x-ray results and observation after getting Keppra   7:45 PM Patient received Keppra  1500 mg IV which is his nighttime dose.  Patient has not had seizures since he has been in the ER.  Patient is COVID-positive.  Chest x-ray did not show any pneumonia and urinalysis is unremarkable.  Mother request Paxlovid.  Since he has a history of asthma so we will give a course of steroids as well.  He has no wheezing currently and has albuterol  at home.  Problems Addressed: COVID-19: acute illness or injury Mild intermittent asthma with exacerbation: acute illness or injury Seizure [facility]): chronic illness or injury with exacerbation, progression, or side effects of treatment  Amount and/or Complexity of Data Reviewed Labs: ordered. Radiology: ordered.  Risk OTC drugs. Prescription drug management.          Dalene Duck, MD 10/30/23 [phone]

## 2023-10-30 NOTE — ED Notes (Signed)
 In and out cath performed with RN and tech assistance, urine obtained, sent to lab for testing per order

## 2023-10-30 NOTE — ED Provider Notes (Signed)
 Zachary [name]   CSN: [id number] Arrival date & time: 10/30/23  1412     History  Chief Complaint  Patient presents with   Fever    Zachary [name] is a 33 y.o. male.  Patient is a 33 year old male with past medical history of cerebral palsy, intellectual delay, nonverbal at baseline, seizure disorder, asthma presenting to the emergency department with fever and breakthrough seizures.  Patient is here with his mother who helps provide history.  She states that she was recently diagnosed with COVID and last night Zachary [name] started to develop chills.  She states that he has had 3 seizures this morning and 2 of which were back-to-back.  She states that he did take his normal seizure medication this morning.  She states that he did have a fever this morning.  She states he has had a mild cough and that she has heard wheezing.  She states that he does also frequently get ear infections and UTIs and he is incontinent of urine.  She states that he does not seem to have any pain and has still been urinating normally.  The history is provided by the patient.  Fever      Home Medications Prior to Admission medications   Medication Sig Start Date End Date Taking? Authorizing Provider  acetaminophen  (TYLENOL ) 325 MG tablet Take 325 mg by mouth every 6 (six) hours as needed. Take 650 mg by mouth every 6 (six) hours as needed for mild pain (1-3). 10/28/19   [provider]  albuterol  (PROVENTIL ) (2.5 MG/3ML) 0.083% nebulizer solution Take 3 mLs (2.5 mg total) by nebulization every 6 (six) hours as needed for wheezing or shortness of breath. 05/09/23   Hadassah Letters, MD  budesonide  (PULMICORT ) 0.5 MG/2ML nebulizer solution Take 2 mLs (0.5 mg total) by nebulization daily. 07/17/22   Ramgoolam, Andres, MD  diazepam  (VALIUM ) 2 MG tablet Give 1/2 to 1 tablet once per day as needed for agitation and teeth grinding Patient not taking: Reported on  09/06/2023 08/16/23   Ramgoolam, Andres, MD  Docusate Sodium (DSS) 100 MG CAPS Take by mouth. 03/20/21   [provider]  doxycycline  (MONODOX ) 100 MG capsule Take one cap po QD x 7 days with food and drink PRN flares. 04/03/23   Elta Halter, MD  Fluocinolone  Acetonide Body (DERMA-SMOOTHE Brennan Camara BODY) 0.01 % OIL Apply to aa's eczema BID up to 5d/wk PRN. 04/03/23   Elta Halter, MD  fluticasone  (FLONASE ) 50 MCG/ACT nasal spray Place 1 spray into both nostrils daily as needed.    [provider]  hydrOXYzine  (ATARAX ) 25 MG tablet Take 1 tablet (25 mg total) by mouth 2 (two) times daily as needed (agitation). 05/23/23   Lowell Rude, MD  KEPPRA  1000 MG tablet TAKE 1 AND 1/2 TABLETS EVERY MORNING AND TAKE TWO TABLETS AT NIGHT 10/03/23   Lyndol Santee, NP  ketoconazole  (NIZORAL ) 2 % cream Apply 1 Application topically daily. 12/22/22   Hadassah Letters, MD  ketoconazole  (NIZORAL ) 2 % shampoo Apply topically 2 (two) times a week. WASH FACE,SCALP, AND BODY TWICE A WEEK, LET SIT FIVE TO 10 MINUTES BEFORE WASHING OFF 04/04/23   Elta Halter, MD  mineral oil enema Place 1 enema rectally as needed for severe constipation. Family reports using once or twice weekly    [provider]  MOTPOLY  XR 150 MG CP24 Take 300 mg by mouth at bedtime. 10/03/23   Lyndol Santee, NP  omeprazole  (PRILOSEC) 20 MG capsule Take 1 capsule (20 mg total) by mouth 2 (two) times daily before a meal. 10/03/23   Lyndol Santee, NP  OXcarbazepine  ER (OXTELLAR XR ) 600 MG TB24 TAKE THREE TABLETS BY MOUTH nightly AT BEDTIME 10/03/23   Lyndol Santee, NP  polyethylene glycol (MIRALAX  / GLYCOLAX ) 17 g packet Take 1 Container by mouth daily.    [provider]  spironolactone (ALDACTONE) 50 MG tablet Take 50 mg by mouth 2 (two) times daily.    [provider]  VALTOCO  20 MG DOSE 2 x 10 MG/0.1ML LQPK Give 1 spray into each nostril for seizures lasting 2 minutes or longer 10/17/23    Lyndol Santee, NP      Allergies    Depakote [divalproex sodium], Lyrica [pregabalin], Topamax [topiramate], Valproic acid, and Penicillins    Review of Systems   Review of Systems  Constitutional:  Positive for fever.    Physical Exam Updated Vital Signs BP 101/81   Pulse (!) 129   Temp (!) 102.4 F (39.1 C) (Oral)   Resp (!) 31   Ht 5' (1.524 m)   Wt 84 kg   SpO2 94%   BMI 36.17 kg/m  Physical Exam Vitals and nursing [name] reviewed.  Constitutional:      General: He is not in acute distress.    Appearance: Normal appearance.  HENT:     Head: Normocephalic and atraumatic.     Right Ear: Tympanic membrane, ear canal and external ear normal.     Left Ear: Tympanic membrane, ear canal and external ear normal.     Ears:     Comments: Bilateral ear tubes in place    Nose: Nose normal.     Mouth/Throat:     Mouth: Mucous membranes are moist.     Pharynx: Oropharynx is clear.  Eyes:     Extraocular Movements: Extraocular movements intact.     Conjunctiva/sclera: Conjunctivae normal.  Cardiovascular:     Rate and Rhythm: Normal rate and regular rhythm.     Heart sounds: Normal heart sounds.  Pulmonary:     Effort: Pulmonary effort is normal.     Breath sounds: Wheezing (Mild end expiratory at anterior apices and posterior bases) present.  Abdominal:     General: Abdomen is flat.     Palpations: Abdomen is soft.     Tenderness: There is no abdominal tenderness.  Musculoskeletal:        General: Normal range of motion.     Cervical back: Normal range of motion.  Skin:    General: Skin is warm and dry.  Neurological:     General: No focal deficit present.     Mental Status: He is alert. Mental status is at baseline.  Psychiatric:        Mood and Affect: Mood normal.        Behavior: Behavior normal.     ED Results / Procedures / Treatments   Labs (all labs ordered are listed, but only abnormal results are displayed) Labs Reviewed  CBC WITH  DIFFERENTIAL/PLATELET - Abnormal; Notable for the following components:      Result Value   Platelets 148 (*)    Lymphs Abs 0.4 (*)    All other components within normal limits  RESP PANEL BY RT-PCR (RSV, FLU A&B, COVID)  [id number]  COMPREHENSIVE METABOLIC PANEL WITH GFR  MAGNESIUM   PHOSPHORUS  URINALYSIS, W/ REFLEX TO CULTURE (INFECTION SUSPECTED)  I-STAT CG4 LACTIC ACID, ED  CBG MONITORING,  ED  I-STAT CG4 LACTIC ACID, ED    EKG EKG Interpretation Date/Time:  Wednesday Oct 30 2023 14:39:34 EDT Ventricular Rate:  94 PR Interval:  172 QRS Duration:  86 QT Interval:  325 QTC Calculation: 405 R Axis:   56  Text Interpretation: Sinus rhythm Nonspecific repol abnormality, inferior leads Minimal ST elevation, lateral leads Baseline wander Otherwise no significant change Confirmed by Celesta Coke ([id number]) on 10/30/2023 4:10:00 PM  Radiology No results found.  Procedures Procedures    Medications Ordered in ED Medications  ipratropium-albuterol  (DUONEB) 0.5-2.5 (3) MG/3ML nebulizer solution 3 mL (has no administration in time range)  levETIRAcetam  (KEPPRA ) undiluted injection 1,500 mg (has no administration in time range)  methylPREDNISolone sodium succinate (SOLU-MEDROL) 125 mg/2 mL injection 125 mg (125 mg Intravenous Given 10/30/23 1545)  acetaminophen  (TYLENOL ) tablet 650 mg (650 mg Oral Given 10/30/23 1545)  sodium chloride  0.9 % bolus 1,000 mL (1,000 mLs Intravenous New Bag/Given 10/30/23 1540)    ED Course/ Medical Decision Making/ A&P Clinical Course as of 10/30/23 1621  Wed Oct 30, 2023  1619 Patient signed out to Dr. Delana Favors pending labs, urine, and CXR in stable condition. [VK]    Clinical Course User Index [VK] Kingsley, Maris Bena K, DO                                 Medical Decision Making This patient presents to the ED with chief complaint(s) of fever, seizures with pertinent past medical history of seizure disorder, asthma, cerebral palsy, developmental delay and  non-verbal at baseline which further complicates the presenting complaint. The complaint involves an extensive differential diagnosis and also carries with it a high risk of complications and morbidity.    The differential diagnosis includes no missed meds making noncompliance unlikely, viral syndrome, other infection, dehydration, electrolyte derangement sepsis  Additional history obtained: Additional history obtained from family Records reviewed Primary Care Documents  ED Course and Reassessment: On patient's arrival he is afebrile and otherwise hemodynamically stable in no acute distress.  Patient will have labs performed to evaluate for sepsis or other infection.  He does have some wheezing on exam concerning for asthma exacerbation and will be given steroids and nebs.  He will be started on fluids and given Tylenol  for his fever and will be closely reassessed.  Independent labs interpretation:  The following labs were independently interpreted: CBC and glucose normal  Independent visualization of imaging: - Pending   Amount and/or Complexity of Data Reviewed Labs: ordered. Radiology: ordered.  Risk OTC drugs. Prescription drug management.           Final Clinical Impression(s) / ED Diagnoses Final diagnoses:  None    Rx / DC Orders ED Discharge Orders     None         Kingsley, Bane Hagy K, DO 10/30/23 1621

## 2023-10-30 NOTE — Discharge Instructions (Addendum)
 You have COVID infection causing your fevers.  I have prescribed Paxlovid  You also have mild asthma exacerbation.  You can use your albuterol  as prescribed and I have prescribed Medrol Dosepak  Please follow-up with your doctor and continue your seizure meds  Return to ER if you have another seizure, trouble breathing, fever

## 2023-11-27 ENCOUNTER — Other Ambulatory Visit (INDEPENDENT_AMBULATORY_CARE_PROVIDER_SITE_OTHER): Payer: Self-pay | Admitting: Pediatrics

## 2023-11-28 ENCOUNTER — Encounter (INDEPENDENT_AMBULATORY_CARE_PROVIDER_SITE_OTHER): Payer: Self-pay | Admitting: Family

## 2023-11-28 ENCOUNTER — Other Ambulatory Visit (INDEPENDENT_AMBULATORY_CARE_PROVIDER_SITE_OTHER): Payer: Self-pay | Admitting: Pediatrics

## 2023-11-28 DIAGNOSIS — G40209 Localization-related (focal) (partial) symptomatic epilepsy and epileptic syndromes with complex partial seizures, not intractable, without status epilepticus: Secondary | ICD-10-CM

## 2023-12-03 ENCOUNTER — Encounter (INDEPENDENT_AMBULATORY_CARE_PROVIDER_SITE_OTHER): Payer: Self-pay

## 2023-12-03 DIAGNOSIS — G40209 Localization-related (focal) (partial) symptomatic epilepsy and epileptic syndromes with complex partial seizures, not intractable, without status epilepticus: Secondary | ICD-10-CM

## 2023-12-03 MED ORDER — KEPPRA 1000 MG PO TABS: ORAL_TABLET | ORAL | 5 refills | Status: DC

## 2023-12-18 ENCOUNTER — Telehealth (INDEPENDENT_AMBULATORY_CARE_PROVIDER_SITE_OTHER): Payer: MEDICAID | Admitting: Family

## 2023-12-25 ENCOUNTER — Telehealth (INDEPENDENT_AMBULATORY_CARE_PROVIDER_SITE_OTHER): Payer: MEDICAID | Admitting: Family

## 2024-01-03 NOTE — Progress Notes (Signed)
 ONO results report received from  [facility]  and scanned to media tab. Provider notified for review

## 2024-01-19 NOTE — Progress Notes (Unsigned)
 This is a Pediatric Specialist E-Visit consult/follow up provided via My Chart Video Visit (Caregility). Zachary [name] and Zachary [name] consented to an E-Visit consult today.  Is the patient present for the video visit? yes Location of patient: Zachary [name] is at home  Is the patient located in the state of [place] ? yes Location of provider: Ellouise Bollman, NP-C is at office Patient was referred by Darrol Merck, MD   The following participants were involved in this E-Visit: CMA, NP, patient's mother    This visit was done via VIDEO   Chief Complain/ Reason for E-Visit today: seizure follow up Total time on call: 15 minutes Follow up: 4 months   GREYDON BETKE   MRN:  [id number]  [birth year]   Provider: Ellouise Bollman NP-C Location of Care: [facility]  Visit type: Return visit  Last visit: 10/03/2023  Referral source: Darrol Merck, MD History from: Epic chart and patient's mother  Brief history:  Copied from previous record: History of focal epilepsy s/p VNS insertion, autism spectrum disorder, congenital quadriparesis, obstructive sleep apnea, and intellectual disability    Due to his medical condition, Zachary [name] is indefinitely incontinent of stool and urine.  It is medically necessary for him to use diapers, underpads, and gloves to assist with hygiene and skin integrity.    Today's concerns: Mom reports that Narayan has been clingy with her since she left him at home with family caregivers when she went to [place] for a week in May.  She and [name] had Covid infection after her trip. She says that he generally weathered it well but that he did have slight increase in seizure activity during that illness.  Mccormick was recently seen by [facility] and the decision was made to change this VNS to a newer model that can be programmed off at night. Mom reports that when he had a recent sleep study that he was noted to have apnea during  sleep each time the VNS fired, so the plan is to replace it, then program it off during sleep. He has not yet been scheduled Mom also notes that Zachary [name] needs a crown replaced and that the dentist at [facility] will contact the [facility] program to see if it can be done at the same time he has VNS surgery in order to avoid two sedation events.  Zachary [name] has had problems with pulling at his left ear for some time. He is currently being treated with Amoxicillin  for suspected otitis media and has been unable to take Hydroxyzine  because of potential drug interaction. Mom notes that he is more anxious when not taking Hydroxyzine  and that she gave him one dose of Diazepam  when he was particularly agitated.  Mom also reports that his neurologist suggested that when Alvia pulls at his ear that it may also be side effect from VNS firing and that new device may reduce that.  Zachary [name] has been otherwise generally healthy since he was last seen. No health concerns today other than previously mentioned.  Review of systems: Please see HPI for neurologic and other pertinent review of systems. Otherwise all other systems were reviewed and were negative.  Problem List: Patient Active Problem List   Diagnosis Date Noted   Encounter for long-term current use of medication 09/07/2023   Gastroesophageal reflux disease 08/23/2023   Bruxism (teeth grinding) 08/18/2023   Agitation 08/18/2023   Acute otitis media in pediatric patient, bilateral 08/18/2023   BMI 36.0-36.9,adult 07/04/2023  Otitis media in pediatric patient, right 07/04/2023   Acute bacterial sinusitis 05/12/2023   Acute otitis media of left ear in pediatric patient 11/30/2022   Fracture shoulder 11/22/2022   Intellectual delay 10/05/2022   Urinary incontinence without sensory awareness 10/05/2022   Constipation 10/05/2022   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 Lynwood Repress. Patient's AHI was 39.6 RDI is 43.6 positional component. Patient will need desensitization and a gentle approach to CPAP titration.    CP (cerebral palsy) (HCC)    Fracture of femur, intertrochanteric, closed (HCC)    Fracture shoulder    left   Hypertension    MR (mental retardation)    Recurrent apnea    Seizures (HCC)    Status post VNS (vagus nerve stimulator) placement 11/09/2013   Placed in 05/23/11, Dr Susen follows the settings and his seizure disorder. .     Past medical history comments: See HPI Copied from previous record: Age at seizure onset: 33 years of age  Description of all seizure types and duration: Semiology #1: Generalization tonic-clonic lasting up to 2 minutes in duration.  Semiology #2: Focal seizures with hiccup noises, slight tremble of hand, and unresponsiveness that stop with VNS magnet activation.   Complications from seizures (trauma, etc.): He has broken his left shoulder from seizure.  h/o status epilepticus: No  Surgical history: Past Surgical History:  Procedure Laterality Date   FOOT SURGERY     FRACTURE SURGERY     HIP SURGERY     IMPLANTATION VAGAL NERVE STIMULATOR     Pediatomy  06/05/2007   SHOULDER SURGERY     TONSILLECTOMY  06/05/2007   TYMPANOSTOMY TUBE PLACEMENT  06/05/2007     Family history: family history includes Autism in an other family member; Cancer in his paternal grandmother; Diabetes in his paternal grandfather.   Social history: Social History   Socioeconomic  History   Marital status: Single    Spouse name: Not on file   Number of children: Not on file   Years of education: Not on file   Highest education level: Not on file  Occupational History   Not on file  Tobacco Use   Smoking status: Never    Passive exposure: Never   Smokeless tobacco: Never  Substance and Sexual Activity   Alcohol use: No   Drug use: No   Sexual activity: Never  Other Topics Concern   Not on file  Social History Narrative   Lavi attends [employer] Monday through Friday    He lives with his mother, maternal grandmother, and mom has a client who lives in the home as an AFL    He enjoys swimming and going to [employer].   Social Drivers of Corporate investment banker Strain: Not on file  Food Insecurity: Low Risk  (02/14/2023)   Received from [facility]   Hunger Vital Sign    Within the past 12 months, you worried that your food would run out before you got money to buy more:  Never true    Within the past 12 months, the food you bought just didn't last and you didn't have money to get more. : Never true  Transportation Needs: No Transportation Needs (02/14/2023)   Received from [facility]    In the past 12 months, has lack of reliable transportation kept you from medical appointments, meetings, work or from getting things needed for daily living? : No  Physical Activity: Not on file  Stress: Not on file  Social Connections: Not on file  Intimate Partner Violence: Not on file    Past/failed meds: Copied from previous record: Topamax discontinued due to weight loss Depakote discontinued due to low platelets  Pregabalin discontinued due to sleepiness    Allergies: Allergies  Allergen Reactions   Depakote [Divalproex Sodium] Other (See Comments)    Low Platelets   Lyrica [Pregabalin] Other (See Comments)    Sleepiness   Topamax [Topiramate] Other (See Comments)    Weight Loss   Valproic Acid Other (See Comments)     WILD MAN     Low Platelets   Penicillins Rash    Has patient had a PCN reaction causing immediate rash, facial/tongue/throat swelling, SOB or lightheadedness with hypotension: Unknown Has patient had a PCN reaction causing severe rash involving mucus membranes or skin necrosis: Yes Has patient had a PCN reaction that required hospitalization: No Has patient had a PCN reaction occurring within the last 10 years: No If all of the above answers are NO, then may proceed with Cephalosporin use.     Immunizations: Immunization History  Administered Date(s) Administered   DTaP 09/19/1989, 11/20/1989, 01/21/1990, 10/24/1990, 02/28/1995   HIB (PRP-OMP) 09/19/1989, 11/20/1989, 01/21/1990, 10/24/1990   Hepatitis B 03/13/2002, 04/17/2002, 09/09/2002   IPV 09/19/1989, 11/20/1989, 10/24/1990, 02/28/1995   Influenza Split 01/22/2008, 03/01/2009, 04/05/2011   Influenza,inj,Quad PF,6+ Mos 03/12/2016, 04/04/2017, 04/15/2018, 05/05/2019, 05/18/2020, 06/13/2022   Influenza,inj,quad, With Preservative 04/02/2013, 01/29/2014, 01/31/2015   MMR 10/24/1990, 02/28/1995   Meningococcal Conjugate 06/25/2011   Moderna Sars-Covid-2 Vaccination 08/06/2019, 09/11/2019, 04/23/2020   Td 11/19/2007   Tdap 05/05/2019    Diagnostics/Screenings: Copied from previous record: Sleep study ([facility]) 07/29/2023- INTERPRETATION: This overnight polysomnogram demonstrates that the patient has:  1) Overall, moderate obstructive sleep apnea (AHI 29) 2) the majority of the respiratory events (AHI approximately 20) are secondary to device stimulation from his VNS (vagal nerve stimulator). 3) Decreased sleep efficiency (55%) The apnea/hypopnea breakdown was as follows: 96.8% obstructive, 2.4% central, 0.8% mixed. The SaO2 nadir during sleep was 77%. CLINICAL CORRELATION:  1) The degree of sleep apnea in this study is moderate and may contribute to daytime sleepiness and long-term cardiovascular morbidity. 2) Initial therapeutic  effort should focus on adjustment of stimulation parameters of VNS to lessen impact on ventilation during sleep 3) Consider treatment with CPAP but with careful follow-up given uncertain efficacy in this clinical setting.    EEG 10/01/22 Impression: This is an overall normal record with the patient in awake states. No evidence of epileptic activity or decreased seizure threshold.  Background activity without focal slowing or evidence of encephalopathy.  Clinical correlation advised.  Physical Exam: Wt 187 lb (84.8 kg)   BMI 36.52 kg/m   Examination limited by video format General: well developed, well nourished man, seated at home with his mother, in no evident distress Head: normocephalic and atraumatic. No dysmorphic features. Neck: supple Musculoskeletal: No skeletal deformities or obvious scoliosis Skin: no rashes or neurocutaneous lesions  Neurologic Exam Mental Status: Awake and  fully alert.  Has no language. Was not attentive to the video.  Cranial Nerves: Turns to localize faces, objects and sounds in the periphery.  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 No recent diagnostic studies to be reviewed with the patient.  Plan until next visit: Continue medications as prescribed  Call for questions or concerns Return in about 4 months (around 05/21/2024).  The medication list was reviewed and reconciled. No  changes were made in the prescribed medications today. A complete medication list was provided to the patient.  Allergies as of 01/20/2024       Reactions   Depakote [divalproex Sodium] Other (See Comments)   Low Platelets   Lyrica [pregabalin] Other (See Comments)   Sleepiness   Topamax [topiramate] Other (See Comments)   Weight Loss   Valproic Acid Other (See Comments)   WILD MAN     Low Platelets   Penicillins Rash   Has patient had a PCN reaction causing immediate rash, facial/tongue/throat swelling, SOB or lightheadedness with hypotension: Unknown Has patient had a PCN reaction causing severe rash involving mucus membranes or skin necrosis: Yes Has patient had a PCN reaction that required hospitalization: No Has patient had a PCN reaction occurring within the last 10 years: No If all of the above answers are NO, then may proceed with Cephalosporin use.        Medication List        Accurate as of January 20, 2024  3:31 PM. If you have any questions, ask your nurse or doctor.          acetaminophen  325 MG tablet Commonly known as: TYLENOL  Take 325 mg by mouth every 6 (six) hours as needed. Take 650 mg by mouth every 6 (six) hours as needed for mild pain (1-3).   albuterol  (2.5 MG/3ML) 0.083% nebulizer solution Commonly known as: PROVENTIL  Take 3 mLs (2.5 mg total) by nebulization every 6 (six) hours as needed for wheezing or shortness of breath.   budesonide  0.5 MG/2ML nebulizer solution Commonly known as: PULMICORT  Take 2 mLs (0.5 mg total) by nebulization daily.   diazepam  2 MG tablet Commonly known as: VALIUM  Give 1/2 to 1 tablet once per day as needed for agitation and teeth grinding   doxycycline  100 MG capsule Commonly known as: MONODOX  Take one cap po QD x 7 days with food and drink PRN flares.   DSS 100 MG Caps Take by mouth.   Fluocinolone  Acetonide Body 0.01 % Oil Commonly known as: Derma-Smoothe /FS Body Apply to aa's eczema BID up to 5d/wk  PRN.   fluticasone  50 MCG/ACT nasal spray Commonly known as: FLONASE  Place 1 spray into both nostrils daily as needed.   hydrOXYzine  25 MG tablet Commonly known as: ATARAX  TAKE ONE TABLET TWICE DAILY AS NEEDED FOR AGITATION   Keppra  1000 MG tablet Generic drug: levETIRAcetam  TAKE 1 AND 1/2 TABLETS EVERY MORNING AND TAKE TWO TABLETS AT NIGHT   ketoconazole  2 % cream Commonly known as: NIZORAL  Apply 1 Application topically daily.   ketoconazole  2 % shampoo Commonly known as: NIZORAL   Apply topically 2 (two) times a week. WASH FACE,SCALP, AND BODY TWICE A WEEK, LET SIT FIVE TO 10 MINUTES BEFORE WASHING OFF   methylPREDNISolone  4 MG Tbpk tablet Commonly known as: MEDROL  DOSEPAK Use as directed   mineral oil enema Place 1 enema rectally as needed for severe constipation. Family reports using once or twice weekly   Motpoly  XR 150 MG Cp24 Generic drug: Lacosamide  ER Take 300 mg by mouth at bedtime.   omeprazole  20 MG capsule Commonly known as: PRILOSEC Take 1 capsule (20 mg total) by mouth 2 (two) times daily before a meal.   OXcarbazepine  ER 600 MG Tb24 Commonly known as: Oxtellar XR  TAKE THREE TABLETS BY MOUTH nightly AT BEDTIME   polyethylene glycol 17 g packet Commonly known as: MIRALAX  / GLYCOLAX  Take 1 Container by mouth daily.   spironolactone 50 MG tablet Commonly known as: ALDACTONE Take 50 mg by mouth 2 (two) times daily.   Valtoco  20 MG Dose 2 x 10 MG/0.1ML Lqpk Generic drug: diazePAM  (20 MG Dose) Give 1 spray into each nostril for seizures lasting 2 minutes or longer      Total time spent with the patient was 15 minutes, of which 50% or more was spent in counseling and coordination of care.  Ellouise Bollman NP-C [facility] [address] [place], [place] [address] Ph. [phone] Fax [phone]

## 2024-01-20 ENCOUNTER — Encounter (INDEPENDENT_AMBULATORY_CARE_PROVIDER_SITE_OTHER): Payer: Self-pay | Admitting: Family

## 2024-01-20 ENCOUNTER — Telehealth (INDEPENDENT_AMBULATORY_CARE_PROVIDER_SITE_OTHER): Payer: MEDICAID | Admitting: Family

## 2024-01-20 VITALS — Wt 187.0 lb

## 2024-01-20 DIAGNOSIS — N3942 Incontinence without sensory awareness: Secondary | ICD-10-CM

## 2024-01-20 DIAGNOSIS — F819 Developmental disorder of scholastic skills, unspecified: Secondary | ICD-10-CM

## 2024-01-20 DIAGNOSIS — F840 Autistic disorder: Secondary | ICD-10-CM

## 2024-01-20 DIAGNOSIS — R451 Restlessness and agitation: Secondary | ICD-10-CM | POA: Diagnosis not present

## 2024-01-20 DIAGNOSIS — G40319 Generalized idiopathic epilepsy and epileptic syndromes, intractable, without status epilepticus: Secondary | ICD-10-CM

## 2024-01-20 DIAGNOSIS — K219 Gastro-esophageal reflux disease without esophagitis: Secondary | ICD-10-CM | POA: Diagnosis not present

## 2024-01-20 DIAGNOSIS — G40219 Localization-related (focal) (partial) symptomatic epilepsy and epileptic syndromes with complex partial seizures, intractable, without status epilepticus: Secondary | ICD-10-CM | POA: Diagnosis not present

## 2024-01-20 DIAGNOSIS — Z9682 Presence of neurostimulator: Secondary | ICD-10-CM | POA: Insufficient documentation

## 2024-01-20 NOTE — Patient Instructions (Signed)
 It was a pleasure to see you today!  Instructions for you until your next appointment are as follows: Continue Nichalas's medications as prescribed Call for any questions or concerns Please sign up for MyChart if you have not done so. Please plan to return for follow up in December or sooner if needed.  Feel free to contact our office during normal business hours at [phone] with questions or concerns. If there is no answer or the call is outside business hours, please leave a message and our clinic staff will call you back within the next business day.  If you have an urgent concern, please stay on the line for our after-hours answering service and ask for the on-call neurologist.     I also encourage you to use MyChart to communicate with me more directly. If you have not yet signed up for MyChart within [facility], the front desk staff can help you. However, please note that this inbox is NOT monitored on nights or weekends, and response can take up to 2 business days.  Urgent matters should be discussed with the on-call pediatric neurologist.   At [facility], we are committed to providing exceptional care. You will receive a patient satisfaction survey through text or email regarding your visit today. Your opinion is important to me. Comments are appreciated.

## 2024-01-30 ENCOUNTER — Telehealth: Payer: Self-pay | Admitting: Pediatrics

## 2024-01-30 ENCOUNTER — Ambulatory Visit (INDEPENDENT_AMBULATORY_CARE_PROVIDER_SITE_OTHER): Payer: MEDICAID | Admitting: Pediatrics

## 2024-01-30 VITALS — BP 120/74 | HR 80 | Temp 98.1°F | Resp 20 | Ht 63.0 in | Wt 182.0 lb

## 2024-01-30 DIAGNOSIS — L739 Follicular disorder, unspecified: Secondary | ICD-10-CM

## 2024-01-30 DIAGNOSIS — Z9682 Presence of neurostimulator: Secondary | ICD-10-CM

## 2024-01-30 DIAGNOSIS — F458 Other somatoform disorders: Secondary | ICD-10-CM | POA: Diagnosis not present

## 2024-01-30 DIAGNOSIS — Z01818 Encounter for other preprocedural examination: Secondary | ICD-10-CM | POA: Diagnosis not present

## 2024-01-30 DIAGNOSIS — G40319 Generalized idiopathic epilepsy and epileptic syndromes, intractable, without status epilepticus: Secondary | ICD-10-CM

## 2024-01-30 DIAGNOSIS — G40209 Localization-related (focal) (partial) symptomatic epilepsy and epileptic syndromes with complex partial seizures, not intractable, without status epilepticus: Secondary | ICD-10-CM

## 2024-01-30 DIAGNOSIS — F840 Autistic disorder: Secondary | ICD-10-CM

## 2024-01-30 DIAGNOSIS — N3942 Incontinence without sensory awareness: Secondary | ICD-10-CM

## 2024-01-30 DIAGNOSIS — I10 Essential (primary) hypertension: Secondary | ICD-10-CM

## 2024-01-30 DIAGNOSIS — G808 Other cerebral palsy: Secondary | ICD-10-CM

## 2024-01-30 DIAGNOSIS — R451 Restlessness and agitation: Secondary | ICD-10-CM | POA: Diagnosis not present

## 2024-01-30 DIAGNOSIS — F819 Developmental disorder of scholastic skills, unspecified: Secondary | ICD-10-CM

## 2024-01-30 LAB — POCT HEMOGLOBIN: Hemoglobin: 14.7 g/dL — AB (ref 11–14.6)

## 2024-01-30 MED ORDER — KETOCONAZOLE 2 % EX SHAM: MEDICATED_SHAMPOO | CUTANEOUS | 12 refills | Status: DC

## 2024-01-30 MED ORDER — DIAZEPAM 2 MG PO TABS: ORAL_TABLET | ORAL | 5 refills | Status: AC

## 2024-01-30 MED ORDER — FLUOCINOLONE ACETONIDE BODY 0.01 % EX OIL: TOPICAL_OIL | CUTANEOUS | 12 refills | Status: AC

## 2024-01-30 NOTE — Progress Notes (Unsigned)
  Current Outpatient Medications:    acetaminophen  (TYLENOL ) 325 MG tablet, Take 325 mg by mouth every 6 (six) hours as needed. Take 650 mg by mouth every 6 (six) hours as needed for mild pain (1-3)., Disp: , Rfl:    albuterol  (PROVENTIL ) (2.5 MG/3ML) 0.083% nebulizer solution, Take 3 mLs (2.5 mg total) by nebulization every 6 (six) hours as needed for wheezing or shortness of breath., Disp: 75 mL, Rfl: 12   budesonide  (PULMICORT ) 0.5 MG/2ML nebulizer solution, Take 2 mLs (0.5 mg total) by nebulization daily., Disp: 60 mL, Rfl: 12   diazepam  (VALIUM ) 2 MG tablet, Give 1/2 to 1 tablet once per day as needed for agitation and teeth grinding, Disp: 30 tablet, Rfl: 0   Docusate Sodium (DSS) 100 MG CAPS, Take by mouth., Disp: , Rfl:    doxycycline  (MONODOX ) 100 MG capsule, Take one cap po QD x 7 days with food and drink PRN flares., Disp: 30 capsule, Rfl: 5   Fluocinolone  Acetonide Body (DERMA-SMOOTHE /FS BODY) 0.01 % OIL, Apply to aa's eczema BID up to 5d/wk PRN., Disp: 118.28 mL, Rfl: 5   fluticasone  (FLONASE ) 50 MCG/ACT nasal spray, Place 1 spray into both nostrils daily as needed., Disp: , Rfl:    hydrOXYzine  (ATARAX ) 25 MG tablet, TAKE ONE TABLET TWICE DAILY AS NEEDED FOR AGITATION, Disp: 60 tablet, Rfl: 0   KEPPRA  1000 MG tablet, TAKE 1 AND 1/2 TABLETS EVERY MORNING AND TAKE TWO TABLETS AT NIGHT, Disp: 105 tablet, Rfl: 5   ketoconazole  (NIZORAL ) 2 % cream, Apply 1 Application topically daily., Disp: 60 g, Rfl: 3   ketoconazole  (NIZORAL ) 2 % shampoo, Apply topically 2 (two) times a week. WASH FACE,SCALP, AND BODY TWICE A WEEK, LET SIT FIVE TO 10 MINUTES BEFORE WASHING OFF, Disp: 120 mL, Rfl: 6   mineral oil enema, Place 1 enema rectally as needed for severe constipation. Family reports using once or twice weekly, Disp: , Rfl:    MOTPOLY  XR 150 MG CP24, Take 300 mg by mouth at bedtime., Disp: 60 capsule, Rfl: 5   omeprazole  (PRILOSEC) 20 MG capsule, Take 1 capsule (20 mg total) by mouth 2 (two) times  daily before a meal., Disp: 60 capsule, Rfl: 5   OXcarbazepine  ER (OXTELLAR XR ) 600 MG TB24, TAKE THREE TABLETS BY MOUTH nightly AT BEDTIME, Disp: 372 tablet, Rfl: 5   polyethylene glycol (MIRALAX  / GLYCOLAX ) 17 g packet, Take 1 Container by mouth daily., Disp: , Rfl:    spironolactone (ALDACTONE) 50 MG tablet, Take 50 mg by mouth 2 (two) times daily., Disp: , Rfl:    VALTOCO  20 MG DOSE 2 x 10 MG/0.1ML LQPK, Give 1 spray into each nostril for seizures lasting 2 minutes or longer, Disp: 5 each, Rfl: 5

## 2024-01-30 NOTE — Telephone Encounter (Signed)
 Pre Operative History and Physical Form was faxed, received SUCCESS, placed in dated 28 folder

## 2024-01-31 ENCOUNTER — Encounter: Payer: Self-pay | Admitting: Pediatrics

## 2024-01-31 DIAGNOSIS — L739 Follicular disorder, unspecified: Secondary | ICD-10-CM | POA: Insufficient documentation

## 2024-02-03 ENCOUNTER — Encounter (INDEPENDENT_AMBULATORY_CARE_PROVIDER_SITE_OTHER): Payer: Self-pay

## 2024-02-07 ENCOUNTER — Other Ambulatory Visit (INDEPENDENT_AMBULATORY_CARE_PROVIDER_SITE_OTHER): Payer: Self-pay | Admitting: Family

## 2024-02-10 ENCOUNTER — Encounter: Payer: Self-pay | Admitting: Pediatrics

## 2024-03-26 ENCOUNTER — Other Ambulatory Visit (INDEPENDENT_AMBULATORY_CARE_PROVIDER_SITE_OTHER): Payer: Self-pay | Admitting: Family

## 2024-03-26 DIAGNOSIS — G40319 Generalized idiopathic epilepsy and epileptic syndromes, intractable, without status epilepticus: Secondary | ICD-10-CM

## 2024-03-30 ENCOUNTER — Ambulatory Visit: Payer: MEDICAID | Admitting: Dermatology

## 2024-03-30 DIAGNOSIS — Z79899 Other long term (current) drug therapy: Secondary | ICD-10-CM

## 2024-03-30 DIAGNOSIS — L219 Seborrheic dermatitis, unspecified: Secondary | ICD-10-CM | POA: Diagnosis not present

## 2024-03-30 DIAGNOSIS — L738 Other specified follicular disorders: Secondary | ICD-10-CM | POA: Diagnosis not present

## 2024-03-30 DIAGNOSIS — L708 Other acne: Secondary | ICD-10-CM

## 2024-03-30 DIAGNOSIS — L739 Follicular disorder, unspecified: Secondary | ICD-10-CM

## 2024-03-30 DIAGNOSIS — Z7189 Other specified counseling: Secondary | ICD-10-CM

## 2024-03-30 MED ORDER — DOXYCYCLINE MONOHYDRATE 100 MG PO CAPS: ORAL_CAPSULE | ORAL | 11 refills | Status: AC

## 2024-03-30 MED ORDER — KETOCONAZOLE 2 % EX SHAM: MEDICATED_SHAMPOO | CUTANEOUS | 12 refills | Status: AC

## 2024-03-30 NOTE — Patient Instructions (Signed)

## 2024-03-30 NOTE — Progress Notes (Signed)
   Follow-Up Visit   Subjective  Zachary [name] is a 33 y.o. male who presents for the following: yearly f/u on folliculitis, seb derm and acne on his face, patient recently had a boil on his leg flare up mom started his Doxycycline  tablet which helped, clear today.   Mother  is with patient and contributes to history.   The following portions of the chart were reviewed this encounter and updated as appropriate: medications, allergies, medical history  Review of Systems:  No other skin or systemic complaints except as noted in HPI or Assessment and Plan.  Objective  Well appearing patient in no apparent distress; mood and affect are within normal limits.  A focused examination was performed of the following areas:face,scalp,  Relevant exam findings are noted in the Assessment and Plan.    Assessment & Plan   Folliculitis, Seborrheic dermatitis and Acne -with history of periodic flares With pityrosporum folliculitis Chronic and persistent condition with duration or expected duration over one year. Condition is improving with treatment but not currently at goal. Face and scalp  Exam: Fine open and closed comedones on his face Fine scale on his scalp   Tx: Continue  Doxycycline  100 mg 1 po bid x 14 days with each flare of acne or boils. Take with food and plenty of fluid. Continue Ketoconazole  2% shampoo once a week on face,scalp and body CLN wash 5 times per week  Long term medication management.  Patient is using long term (months to years) prescription medication  to control their dermatologic condition.  These medications require periodic monitoring to evaluate for efficacy and side effects and may require periodic laboratory monitoring.  FOLLICULITIS   Related Medications ketoconazole  (NIZORAL ) 2 % shampoo Apply topically 2 (two) times a week. WASH FACE,SCALP, AND BODY ONCE A WEEK, LET SIT FIVE TO 10 MINUTES BEFORE WASHING OFF SEBORRHEIC DERMATITIS   OTHER  ACNE   COUNSELING AND COORDINATION OF CARE   MEDICATION MANAGEMENT    Return in about 1 year (around 03/30/2025) for seb derm, folliculitis, acne .  IFay Kirks, CMA, am acting as scribe for Alm Rhyme, MD .   Documentation: I have reviewed the above documentation for accuracy and completeness, and I agree with the above.  Alm Rhyme, MD

## 2024-04-02 ENCOUNTER — Ambulatory Visit: Payer: MEDICAID | Admitting: Dermatology

## 2024-04-06 ENCOUNTER — Encounter: Payer: Self-pay | Admitting: Dermatology

## 2024-04-08 ENCOUNTER — Telehealth (INDEPENDENT_AMBULATORY_CARE_PROVIDER_SITE_OTHER): Payer: Self-pay | Admitting: Family

## 2024-04-08 NOTE — Telephone Encounter (Signed)
 Mom said you gave her call and she was returning your call. Call back at your convenience.

## 2024-04-08 NOTE — Telephone Encounter (Signed)
 I called Mom and offered some Motpoly  XR samples for Fleet. She will pick up tomorrow. Mom asked about having Veronica's VNS adjusted and I scheduled him to see Dr Waddell tomorrow.

## 2024-04-09 ENCOUNTER — Ambulatory Visit (INDEPENDENT_AMBULATORY_CARE_PROVIDER_SITE_OTHER): Payer: MEDICAID | Admitting: Pediatrics

## 2024-04-09 ENCOUNTER — Encounter (INDEPENDENT_AMBULATORY_CARE_PROVIDER_SITE_OTHER): Payer: Self-pay | Admitting: Pediatrics

## 2024-04-09 VITALS — BP 110/74 | HR 80 | Wt 183.0 lb

## 2024-04-09 DIAGNOSIS — G40919 Epilepsy, unspecified, intractable, without status epilepticus: Secondary | ICD-10-CM

## 2024-04-09 DIAGNOSIS — Z9689 Presence of other specified functional implants: Secondary | ICD-10-CM

## 2024-05-08 ENCOUNTER — Encounter: Payer: Self-pay | Admitting: Pediatrics

## 2024-05-08 MED ORDER — SULFAMETHOXAZOLE-TRIMETHOPRIM 800-160 MG PO TABS: ORAL_TABLET | ORAL | 4 refills | Status: AC

## 2024-05-18 NOTE — Progress Notes (Signed)
 VNS Interrogation and Reprogramming  Date of Service: 05/18/2024     Patient Name: Zachary [name]      MRN: [id number]      Date of Birth: 29-Oct-1990 Primary Care Physician: Zachary Merck, MD  Indications:    Epilepsy - intractable  Time Out::  N/A  HPI: Patient with recent sleep study showing central apnea when VNS discharges.VNS replaced 02/25/24 to allow customization for day and night settings.  Mother reporting no complications.   Mother reports Zachary [name]'s sleep is variable, but desired sleep schedule is 10pm- 6am.  Procedure: VNS was programmed to increase be off during sleeping hours. Magnet mode still intact at night to allow treatment of nocturnal seizures.  No changes made to daytime settings.    Complications  None. Patient had no vomiting, cough, discomfort, or voice change.   Assessment and plan:  No changes made to medication regiman, no refills sent today.  Mother to call for any changes to seizures or complications related to VNS change  Zachary Geralds MD MPH

## 2024-05-29 ENCOUNTER — Other Ambulatory Visit (INDEPENDENT_AMBULATORY_CARE_PROVIDER_SITE_OTHER): Payer: Self-pay | Admitting: Family

## 2024-05-29 DIAGNOSIS — G40209 Localization-related (focal) (partial) symptomatic epilepsy and epileptic syndromes with complex partial seizures, not intractable, without status epilepticus: Secondary | ICD-10-CM

## 2024-05-29 DIAGNOSIS — K219 Gastro-esophageal reflux disease without esophagitis: Secondary | ICD-10-CM

## 2024-06-22 NOTE — Progress Notes (Unsigned)
 "   Zachary [name]   MRN:  [id number]  08-17-1990   Provider: Ellouise Bollman NP-C Location of Care: [facility]  Visit type: Return visit  Last visit: 01/20/2024  Referral source: Darrol Merck, MD PCP: Darrol Merck, MD History from: Epic chart and patient's mother  Brief history:  Copied from previous record: History of focal epilepsy s/p VNS insertion, autism spectrum disorder, congenital quadriparesis, obstructive sleep apnea, and intellectual disability    Due to his medical condition, Zachary [name] is indefinitely incontinent of stool and urine.  It is medically necessary for him to use diapers, underpads, and gloves to assist with hygiene and skin integrity.     Since last visit:   Zachary [name] has been otherwise generally healthy since Zachary [name] was last seen. No health concerns today other than previously mentioned.  Review of systems: Please see HPI for neurologic and other pertinent review of systems. Otherwise all other systems were reviewed and were negative.  Problem List: Patient Active Problem List   Diagnosis Date Noted   Folliculitis 01/31/2024   Presence of vagal nerve stimulator 01/20/2024   Encounter for long-term current use of medication 09/07/2023   Gastroesophageal reflux disease 08/23/2023   Bruxism (teeth grinding) 08/18/2023   Agitation 08/18/2023   Acute otitis media in pediatric patient, bilateral 08/18/2023   BMI 36.0-36.9,adult 07/04/2023   Otitis media in pediatric patient, right 07/04/2023   Acute bacterial sinusitis 05/12/2023   Acute otitis media of left ear in pediatric patient 11/30/2022   Fracture shoulder 11/22/2022   Intellectual delay 10/05/2022   Urinary incontinence without sensory awareness 10/05/2022   Constipation 10/05/2022   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) 06/11/2022   Encounter for preoperative dental examination 10/15/2020    Physical exam, annual 01/31/2015   Complex partial seizures evolving to generalized tonic-clonic seizures (HCC) 04/21/2014   Obstructive sleep apnea 02/11/2014   Autism spectrum disorder with accompanying intellectual impairment, requiring very substantial support (level 3) 12/30/2013   Congenital quadriparesis (HCC) 12/30/2013   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 Lynwood Repress. Patient's AHI was 39.6 RDI is 43.6 positional component. Patient will need desensitization and a gentle approach to CPAP titration.    CP (cerebral palsy) (HCC)    Fracture of femur, intertrochanteric, closed (HCC)    Fracture shoulder    left   Hypertension    MR (mental retardation)    Recurrent apnea    Seizures (HCC)    Status post VNS (vagus nerve stimulator) placement 11/09/2013   Placed in 05/23/11, Dr Susen follows the settings and his seizure disorder. .     Past medical history comments: See HPI Copied from previous record: Age at seizure onset: 34 years of age  Description of all seizure types and duration: Semiology #1: Generalization tonic-clonic lasting up to 2 minutes in duration.  Semiology #2: Focal seizures with hiccup noises, slight tremble of hand, and unresponsiveness that stop with VNS magnet activation.   Complications from seizures (trauma, etc.): Zachary [name] has broken his left shoulder from seizure.  h/o status epilepticus: No  Surgical history: Past Surgical History:  Procedure Laterality Date   FOOT SURGERY     FRACTURE SURGERY     HIP SURGERY     IMPLANTATION VAGAL NERVE STIMULATOR  Battery Replaceent 9.23.2025   Pediatomy  06/05/2007   SHOULDER SURGERY     TONSILLECTOMY  06/05/2007   TYMPANOSTOMY TUBE PLACEMENT  06/05/2007     Family history: family history includes  Autism in an other family member; Cancer in his paternal grandmother; Diabetes in his paternal grandfather.   Social history: Social History   Socioeconomic History   Marital status: Single    Spouse name: Not on file   Number of children: Not on file   Years of education: Not on file   Highest education level: Not on file  Occupational History   Not on file  Tobacco Use   Smoking status: Never    Passive exposure: Never   Smokeless tobacco: Never  Substance and Sexual Activity   Alcohol use: No   Drug use: No   Sexual activity: Never  Other Topics Concern   Not on file  Social History Narrative   Zachary [name] attends [employer] Monday through Friday    Zachary [name] lives with his mother, maternal grandmother, and mom has a client who lives in the home as an AFL    Zachary [name] enjoys swimming and going to [employer].   Social Drivers of Health   Tobacco Use: Low Risk (04/09/2024)   Patient History    Smoking Tobacco Use: Never    Smokeless Tobacco Use: Never    Passive Exposure: Never  Financial Resource Strain: Not on file  Food Insecurity: Low Risk (02/14/2023)   Received from [facility]   Epic    Within the past 12 months, you worried that your food would run out before you got money to buy more: Never true    Within the past 12 months, the food you bought just didn't last and you didn't have money to get more. : Never true  Transportation Needs: No Transportation Needs (02/14/2023)   Received from [facility]    In the past 12 months, has lack of reliable transportation kept you from medical appointments, meetings, work or from getting things needed for daily living? : No  Physical Activity: Not on file  Stress: Not on file  Social Connections: Not on file  Intimate Partner Violence: Not on file  Depression (EYV7-0): Not on file  Alcohol Screen: Not on file  Housing: Low Risk (02/14/2023)   Received from [facility]   Epic    What is your living situation  today?: I have a steady place to live    Think about the place you live. Do you have problems with any of the following? Choose all that apply:: None/None on this list  Utilities: Low Risk (02/14/2023)   Received from [facility]   Utilities    In the past 12 months has the electric, gas, oil, or water company threatened to shut off services in your home? : No  Health Literacy: Not on file    Past/failed meds: Copied from previous record: Topamax discontinued due to weight loss Depakote discontinued due to low platelets  Pregabalin discontinued due to sleepiness   Allergies: Allergies[1]    Immunizations: Immunization History  Administered Date(s) Administered   DTaP 09/19/1989, 11/20/1989, 01/21/1990, 10/24/1990, 02/28/1995   HIB (PRP-OMP) 09/19/1989, 11/20/1989, 01/21/1990, 10/24/1990   Hepatitis B 03/13/2002, 04/17/2002, 09/09/2002   IPV 09/19/1989, 11/20/1989, 10/24/1990, 02/28/1995   Influenza Split 01/22/2008, 03/01/2009, 04/05/2011   Influenza,inj,Quad PF,6+ Mos 03/12/2016, 04/04/2017, 04/15/2018, 05/05/2019, 05/18/2020, 06/13/2022   Influenza,inj,quad, With Preservative 04/02/2013, 01/29/2014, 01/31/2015   MMR 10/24/1990, 02/28/1995  Meningococcal Conjugate 06/25/2011   Moderna Sars-Covid-2 Vaccination 08/06/2019, 09/11/2019, 04/23/2020   Td 11/19/2007   Tdap 05/05/2019    Diagnostics/Screenings: Copied from previous record: Sleep study ([facility]) 07/29/2023- INTERPRETATION: This overnight polysomnogram demonstrates that the patient has:  1) Overall, moderate obstructive sleep apnea (AHI 29) 2) the majority of the respiratory events (AHI approximately 20) are secondary to device stimulation from his VNS (vagal nerve stimulator). 3) Decreased sleep efficiency (55%) The apnea/hypopnea breakdown was as follows: 96.8% obstructive, 2.4% central, 0.8% mixed. The SaO2 nadir during sleep was 77%. CLINICAL CORRELATION:  1) The degree of sleep apnea in this study is moderate and  may contribute to daytime sleepiness and long-term cardiovascular morbidity. 2) Initial therapeutic effort should focus on adjustment of stimulation parameters of VNS to lessen impact on ventilation during sleep 3) Consider treatment with CPAP but with careful follow-up given uncertain efficacy in this clinical setting.    EEG 10/01/22 Impression: This is an overall normal record with the patient in awake states. No evidence of epileptic activity or decreased seizure threshold.  Background activity without focal slowing or evidence of encephalopathy.  Clinical correlation advised.  Physical Exam: There were no vitals taken for this visit.  General: well developed, well nourished, seated, in no evident distress Head: normocephalic and atraumatic. Oropharynx difficult to examine but appears benign. No dysmorphic features. Neck: supple Cardiovascular: regular rate and rhythm, no murmurs. Respiratory: clear to auscultation bilaterally Abdomen: bowel sounds present all four quadrants, abdomen soft, non-tender, non-distended. No hepatosplenomegaly or masses palpated.Gastrostomy tube in place size  Fr cm AMT MiniOne balloon button, site clean and dry Musculoskeletal: no skeletal deformities or obvious scoliosis. Has contractures**** Skin: no rashes or neurocutaneous lesions  Neurologic Exam Mental Status: awake and fully alert. Has no language.  Smiles responsively. Unable to follow instructions or participate in examination Cranial Nerves: fundoscopic exam - red reflex present.  Unable to fully visualize fundus.  Pupils equal briskly reactive to light.  Turns to localize faces and objects in the periphery. Turns to localize sounds in the periphery. Facial movements are asymmetric, has lower facial weakness with drooling.  Neck flexion and extension *** abnormal with poor head control.  Motor: truncal hypotonia.  *** spastic quadriparesis  Sensory: withdrawal x 4 Coordination: unable to adequately  assess due to patient's inability to participate in examination. *** with reach for objects. Gait and Station: unable to independently stand and bear weight. Able to stand with assistance but needs constant support. Able to take a few steps but has poor balance and needs support.  Reflexes: diminished and symmetric. Toes neutral. No clonus   Impression: No diagnosis found.    Recommendations for plan of care: The patient's previous Epic records were reviewed. No recent diagnostic studies to be reviewed with the patient.   Recommendations and plan until next visit: Continue medications as prescribed  Call for questions or concerns No follow-ups on file.  The medication list was reviewed and reconciled. No changes were made in the prescribed medications today. A complete medication list was provided to the patient.  No orders of the defined types were placed in this encounter.    Allergies as of 06/23/2024       Reactions   Depakote [divalproex Sodium] Other (See Comments)   Low Platelets   Lyrica [pregabalin] Other (See Comments)   Sleepiness   Topamax [topiramate] Other (See Comments)   Weight Loss   Valproic Acid Other (See Comments)   WILD MAN  Low Platelets   Penicillins Rash   Has patient had a PCN reaction causing immediate rash, facial/tongue/throat swelling, SOB or lightheadedness with hypotension: Unknown Has patient had a PCN reaction causing severe rash involving mucus membranes or skin necrosis: Yes Has patient had a PCN reaction that required hospitalization: No Has patient had a PCN reaction occurring within the last 10 years: No If all of the above answers are NO, then may proceed with Cephalosporin use.        Medication List        Accurate as of June 22, 2024  8:23 PM. If you have any questions, ask your nurse or doctor.          acetaminophen  325 MG tablet Commonly known as: TYLENOL  Take 325 mg by mouth every 6 (six) hours as needed.  Take 650 mg by mouth every 6 (six) hours as needed for mild pain (1-3).   albuterol  (2.5 MG/3ML) 0.083% nebulizer solution Commonly known as: PROVENTIL  Take 3 mLs (2.5 mg total) by nebulization every 6 (six) hours as needed for wheezing or shortness of breath.   budesonide  0.5 MG/2ML nebulizer solution Commonly known as: PULMICORT  Take 2 mLs (0.5 mg total) by nebulization daily.   doxycycline  100 MG capsule Commonly known as: MONODOX  Take one cap po QD x 7 days with food and drink PRN flares.   doxycycline  100 MG capsule Commonly known as: MONODOX  Take 1 tablet po bid x 2 weeks for flares of acne and boils   DSS 100 MG Caps Take by mouth.   Fluocinolone  Acetonide Body 0.01 % Oil Commonly known as: Derma-Smoothe /FS Body Apply to aa's eczema BID up to 5d/wk PRN.   fluticasone  50 MCG/ACT nasal spray Commonly known as: FLONASE  Place 1 spray into both nostrils daily as needed.   hydrOXYzine  25 MG tablet Commonly known as: ATARAX  TAKE ONE TABLET TWICE DAILY AS NEEDED FOR AGITATION   Keppra  1000 MG tablet Generic drug: levETIRAcetam  TAKE 1 AND 1/2 TABLETS EVERY MORNING AND TAKE TWO TABLETS AT NIGHT   ketoconazole  2 % cream Commonly known as: NIZORAL  Apply 1 Application topically daily.   ketoconazole  2 % shampoo Commonly known as: NIZORAL  Apply topically 2 (two) times a week. WASH FACE,SCALP, AND BODY ONCE A WEEK, LET SIT FIVE TO 10 MINUTES BEFORE WASHING OFF   mineral oil enema Place 1 enema rectally as needed for severe constipation. Family reports using once or twice weekly   Motpoly  XR 150 MG Cp24 Generic drug: Lacosamide  ER TAKE TWO CAPSULES (300mg ) BY MOUTH at bedtime   omeprazole  20 MG capsule Commonly known as: PRILOSEC TAKE ONE CAPSULE TWICE DAILY BEFORE A MEAL   OXcarbazepine  ER 600 MG Tb24 Commonly known as: Oxtellar XR  TAKE THREE TABLETS BY MOUTH nightly AT BEDTIME   polyethylene glycol 17 g packet Commonly known as: MIRALAX  / GLYCOLAX  Take 1  Container by mouth daily.   spironolactone 50 MG tablet Commonly known as: ALDACTONE Take 50 mg by mouth 2 (two) times daily.   Valtoco  20 MG Dose 2 x 10 MG/0.1ML Lqpk Generic drug: diazePAM  (20 MG Dose) Give 1 spray into each nostril for seizures lasting 2 minutes or longer         I discussed this patient's care with the multiple providers involved in his care today to develop this assessment and plan.  I spent *** minutes caring for the patient today face to face reviewing records, including previous charts and test results, examination of the patient, discussion and education  with the parent/caregiver about his condition, documentation in his chart, developing a plan of care and ordering/placing referrals.  Ellouise Bollman NP-C [facility] [address], [address] [place], [place] [address] Ph. [phone] Fax [phone]          [1]  Allergies Allergen Reactions   Depakote [Divalproex Sodium] Other (See Comments)    Low Platelets   Lyrica [Pregabalin] Other (See Comments)    Sleepiness   Topamax [Topiramate] Other (See Comments)    Weight Loss   Valproic Acid Other (See Comments)    WILD MAN     Low Platelets   Penicillins Rash    Has patient had a PCN reaction causing immediate rash, facial/tongue/throat swelling, SOB or lightheadedness with hypotension: Unknown Has patient had a PCN reaction causing severe rash involving mucus membranes or skin necrosis: Yes Has patient had a PCN reaction that required hospitalization: No Has patient had a PCN reaction occurring within the last 10 years: No If all of the above answers are NO, then may proceed with Cephalosporin use.    "

## 2024-06-23 ENCOUNTER — Ambulatory Visit (INDEPENDENT_AMBULATORY_CARE_PROVIDER_SITE_OTHER): Payer: MEDICAID | Admitting: Family

## 2024-06-23 ENCOUNTER — Encounter (INDEPENDENT_AMBULATORY_CARE_PROVIDER_SITE_OTHER): Payer: Self-pay | Admitting: Family

## 2024-06-23 VITALS — BP 116/72 | HR 80 | Wt 190.2 lb

## 2024-06-23 DIAGNOSIS — R635 Abnormal weight gain: Secondary | ICD-10-CM | POA: Diagnosis not present

## 2024-06-23 DIAGNOSIS — G40319 Generalized idiopathic epilepsy and epileptic syndromes, intractable, without status epilepticus: Secondary | ICD-10-CM

## 2024-06-23 DIAGNOSIS — K219 Gastro-esophageal reflux disease without esophagitis: Secondary | ICD-10-CM | POA: Diagnosis not present

## 2024-06-23 DIAGNOSIS — G40419 Other generalized epilepsy and epileptic syndromes, intractable, without status epilepticus: Secondary | ICD-10-CM

## 2024-06-23 DIAGNOSIS — F840 Autistic disorder: Secondary | ICD-10-CM | POA: Diagnosis not present

## 2024-06-23 DIAGNOSIS — G40209 Localization-related (focal) (partial) symptomatic epilepsy and epileptic syndromes with complex partial seizures, not intractable, without status epilepticus: Secondary | ICD-10-CM

## 2024-06-23 DIAGNOSIS — Z79899 Other long term (current) drug therapy: Secondary | ICD-10-CM | POA: Diagnosis not present

## 2024-06-23 DIAGNOSIS — I10 Essential (primary) hypertension: Secondary | ICD-10-CM

## 2024-06-23 DIAGNOSIS — F819 Developmental disorder of scholastic skills, unspecified: Secondary | ICD-10-CM

## 2024-06-23 MED ORDER — OXCARBAZEPINE ER 600 MG PO TB24: ORAL_TABLET | ORAL | 5 refills | Status: AC

## 2024-06-23 MED ORDER — KEPPRA 1000 MG PO TABS: ORAL_TABLET | ORAL | 5 refills | Status: AC

## 2024-06-23 MED ORDER — OMEPRAZOLE 20 MG PO CPDR: 20.0000 mg | DELAYED_RELEASE_CAPSULE | Freq: Two times a day (BID) | ORAL | 5 refills | Status: AC

## 2024-06-23 NOTE — Patient Instructions (Signed)
 It was a pleasure to see you today!  Instructions for you until your next appointment are as follows: I have given you a blood test order for Gianno. The lab technician starts at 8:15 on Thursday in this office. He can have water to drink and medications, but no food until after the blood has been drawn.  I will call you when I receive the results.  Continue his medications as prescirbed Please sign up for MyChart if you have not done so. Please plan to return for follow up in 6 months or sooner if needed.  Feel free to contact our office during normal business hours at [phone] with questions or concerns. If there is no answer or the call is outside business hours, please leave a message and our clinic staff will call you back within the next business day.  If you have an urgent concern, please stay on the line for our after-hours answering service and ask for the on-call neurologist.     I also encourage you to use MyChart to communicate with me more directly. If you have not yet signed up for MyChart within [facility], the front desk staff can help you. However, please note that this inbox is NOT monitored on nights or weekends, and response can take up to 2 business days.  Urgent matters should be discussed with the on-call pediatric neurologist.   At [facility], we are committed to providing exceptional care. You will receive a patient satisfaction survey through text or email regarding your visit today. Your opinion is important to me. Comments are appreciated.

## 2024-06-26 LAB — CBC WITH DIFFERENTIAL/PLATELET
Absolute Lymphocytes: 1072 {cells}/uL (ref 850–3900)
Absolute Monocytes: 475 {cells}/uL (ref 200–950)
Basophils Absolute: 19 {cells}/uL (ref 0–200)
Basophils Relative: 0.4 %
Eosinophils Absolute: 61 {cells}/uL (ref 15–500)
Eosinophils Relative: 1.3 %
HCT: 45.8 % (ref 39.4–51.1)
Hemoglobin: 15 g/dL (ref 13.2–17.1)
MCH: 27.6 pg (ref 27.0–33.0)
MCHC: 32.8 g/dL (ref 31.6–35.4)
MCV: 84.2 fL (ref 81.4–101.7)
MPV: 9.2 fL (ref 7.5–12.5)
Monocytes Relative: 10.1 %
Neutro Abs: 3074 {cells}/uL (ref 1500–7800)
Neutrophils Relative %: 65.4 %
Platelets: 184 Thousand/uL (ref 140–400)
RBC: 5.44 Million/uL (ref 4.20–5.80)
RDW: 13.2 % (ref 11.0–15.0)
Total Lymphocyte: 22.8 %
WBC: 4.7 Thousand/uL (ref 3.8–10.8)

## 2024-06-26 LAB — LIPID PANEL
Cholesterol: 223 mg/dL — ABNORMAL HIGH
HDL: 73 mg/dL
LDL Cholesterol (Calc): 133 mg/dL — ABNORMAL HIGH
Non-HDL Cholesterol (Calc): 150 mg/dL — ABNORMAL HIGH
Total CHOL/HDL Ratio: 3.1 (calc)
Triglycerides: 74 mg/dL

## 2024-06-26 LAB — COMPREHENSIVE METABOLIC PANEL WITH GFR
AG Ratio: 2.1 (calc) (ref 1.0–2.5)
ALT: 18 U/L (ref 9–46)
AST: 17 U/L (ref 10–40)
Albumin: 4.1 g/dL (ref 3.6–5.1)
Alkaline phosphatase (APISO): 50 U/L (ref 36–130)
BUN: 14 mg/dL (ref 7–25)
CO2: 32 mmol/L (ref 20–32)
Calcium: 9 mg/dL (ref 8.6–10.3)
Chloride: 101 mmol/L (ref 98–110)
Creat: 0.79 mg/dL (ref 0.60–1.26)
Globulin: 2 g/dL (ref 1.9–3.7)
Glucose, Bld: 83 mg/dL (ref 65–139)
Potassium: 3.8 mmol/L (ref 3.5–5.3)
Sodium: 138 mmol/L (ref 135–146)
Total Bilirubin: 0.5 mg/dL (ref 0.2–1.2)
Total Protein: 6.1 g/dL (ref 6.1–8.1)
eGFR: 120 mL/min/1.73m2

## 2024-06-26 LAB — HEMOGLOBIN A1C
Hgb A1c MFr Bld: 5.2 %
Mean Plasma Glucose: 103 mg/dL
eAG (mmol/L): 5.7 mmol/L

## 2024-06-30 ENCOUNTER — Ambulatory Visit (INDEPENDENT_AMBULATORY_CARE_PROVIDER_SITE_OTHER): Payer: Self-pay | Admitting: Family

## 2025-03-30 ENCOUNTER — Ambulatory Visit: Payer: MEDICAID | Admitting: Dermatology
